# Patient Record
Sex: Male | Born: 1940 | Race: White | Hispanic: No | Marital: Single | Smoking: Never smoker
Health system: Southern US, Community
[De-identification: ages and names within clinical notes are randomized; demographics above are authoritative.]

## PROBLEM LIST (undated history)

## (undated) DIAGNOSIS — M1990 Unspecified osteoarthritis, unspecified site: Secondary | ICD-10-CM

## (undated) DIAGNOSIS — I4510 Unspecified right bundle-branch block: Secondary | ICD-10-CM

## (undated) DIAGNOSIS — I2510 Atherosclerotic heart disease of native coronary artery without angina pectoris: Secondary | ICD-10-CM

## (undated) DIAGNOSIS — K219 Gastro-esophageal reflux disease without esophagitis: Secondary | ICD-10-CM

## (undated) DIAGNOSIS — I10 Essential (primary) hypertension: Secondary | ICD-10-CM

## (undated) DIAGNOSIS — T7840XA Allergy, unspecified, initial encounter: Secondary | ICD-10-CM

## (undated) DIAGNOSIS — E782 Mixed hyperlipidemia: Secondary | ICD-10-CM

## (undated) DIAGNOSIS — R252 Cramp and spasm: Secondary | ICD-10-CM

## (undated) DIAGNOSIS — Z5189 Encounter for other specified aftercare: Secondary | ICD-10-CM

## (undated) DIAGNOSIS — G629 Polyneuropathy, unspecified: Secondary | ICD-10-CM

## (undated) DIAGNOSIS — R739 Hyperglycemia, unspecified: Secondary | ICD-10-CM

## (undated) DIAGNOSIS — E119 Type 2 diabetes mellitus without complications: Secondary | ICD-10-CM

## (undated) DIAGNOSIS — I519 Heart disease, unspecified: Secondary | ICD-10-CM

## (undated) DIAGNOSIS — E0842 Diabetes mellitus due to underlying condition with diabetic polyneuropathy: Secondary | ICD-10-CM

## (undated) HISTORY — PX: BACK SURGERY: SHX140

## (undated) HISTORY — DX: Unspecified right bundle-branch block: I45.10

## (undated) HISTORY — PX: PERCUTANEOUS CORONARY STENT INTERVENTION (PCI-S): SHX6016

## (undated) HISTORY — DX: Encounter for other specified aftercare: Z51.89

## (undated) HISTORY — DX: Gastro-esophageal reflux disease without esophagitis: K21.9

## (undated) HISTORY — DX: Allergy, unspecified, initial encounter: T78.40XA

## (undated) HISTORY — DX: Type 2 diabetes mellitus without complications: E11.9

## (undated) HISTORY — DX: Hyperglycemia, unspecified: R73.9

## (undated) HISTORY — PX: TOTAL HIP ARTHROPLASTY: SHX124

## (undated) HISTORY — DX: Heart disease, unspecified: I51.9

## (undated) HISTORY — PX: INGUINAL HERNIA REPAIR: SHX194

## (undated) HISTORY — PX: ACHILLES TENDON REPAIR: SUR1153

## (undated) HISTORY — DX: Atherosclerotic heart disease of native coronary artery without angina pectoris: I25.10

## (undated) HISTORY — PX: REPLACEMENT TOTAL KNEE: SUR1224

## (undated) HISTORY — DX: Polyneuropathy, unspecified: G62.9

## (undated) HISTORY — DX: Cramp and spasm: R25.2

## (undated) HISTORY — DX: Mixed hyperlipidemia: E78.2

## (undated) HISTORY — PX: FOOT SURGERY: SHX648

## (undated) HISTORY — DX: Essential (primary) hypertension: I10

## (undated) HISTORY — PX: CORONARY ANGIOPLASTY: SHX604

## (undated) HISTORY — DX: Unspecified osteoarthritis, unspecified site: M19.90

## (undated) HISTORY — DX: Diabetes mellitus due to underlying condition with diabetic polyneuropathy: E08.42

---

## 2017-08-20 ENCOUNTER — Encounter: Payer: Self-pay | Admitting: Neurology

## 2017-08-26 ENCOUNTER — Other Ambulatory Visit: Payer: Self-pay | Admitting: *Deleted

## 2017-08-26 ENCOUNTER — Ambulatory Visit (INDEPENDENT_AMBULATORY_CARE_PROVIDER_SITE_OTHER): Payer: Federal, State, Local not specified - PPO | Admitting: Neurology

## 2017-08-26 ENCOUNTER — Encounter: Payer: Self-pay | Admitting: Neurology

## 2017-08-26 VITALS — BP 160/80 | HR 63 | Ht 72.0 in | Wt 244.4 lb

## 2017-08-26 DIAGNOSIS — R252 Cramp and spasm: Secondary | ICD-10-CM | POA: Insufficient documentation

## 2017-08-26 DIAGNOSIS — E0842 Diabetes mellitus due to underlying condition with diabetic polyneuropathy: Secondary | ICD-10-CM | POA: Insufficient documentation

## 2017-08-26 DIAGNOSIS — G629 Polyneuropathy, unspecified: Secondary | ICD-10-CM

## 2017-08-26 HISTORY — DX: Diabetes mellitus due to underlying condition with diabetic polyneuropathy: E08.42

## 2017-08-26 HISTORY — DX: Cramp and spasm: R25.2

## 2017-08-26 NOTE — Addendum Note (Signed)
Addended by: Chester Holstein on: 08/26/2017 02:42 PM   Modules accepted: Orders

## 2017-08-26 NOTE — Progress Notes (Signed)
[facility] Clinic Note - Initial Visit   Date: 08/26/17  Darin Mcclure MRN: [id number] DOB: [birth year]   Dear Dr. Ernie Hew:  Thank you for your kind referral of Darin Mcclure for consultation of neuropathy. Although his history is well known to you, please allow [place] to reiterate it for the purpose of our medical record. The patient was accompanied to the clinic by self.   History of Present Illness: Darin Mcclure is a 76 y.o. right-handed Caucasian male with hypertensin, hyperlipidemia, diabetes mellitus, CAD s/p PCI with stent, and GERD presenting for evaluation of neuropathy.    He started having numbness of toes, which had progressed over the years to involve his foot and lower leg.  He was diagnosed with neuropathy and seen at [facility] and many years later, was diagnosed with diabetes mellitus.  He complains that his left foot feels heavy, especially when he wakes up.  He has very brief spells of pulsating sharp pain, lasting 30-min to several hours which can affect both legs.  He has not noticed any triggers.  He has noticed greater difficulty with balance, especially when his eyes are closed.  He has not suffered any falls and walks unassisted.  He was offered gabapentin, but he did not take this because his pain is very sporadic.   He saw Dr. Yehuda Savannah, [facility]. while living in [place] and moved here in July 2018.  He had NCS/EMG which confirmed neuropathy.    Rarely, he has spells of left lateral lower leg cramps which can be severe.  He takes magnesium oxide for cramps.  Out-side paper records, electronic medical record, and images have been reviewed where available and summarized as:  Labs 08/03/2016:  Folate >24, TSH 2.37, Lyme neg, RF neg, CCP neg, HbA1c 6.3, ANA neg, vitamin B12 309  TCD 08/03/2016:  Normal EEG 08/03/2016:  Normal  Cognitive testing 08/03/2016:  Normal NCS/EMG of the legs 08/03/2016:  Diffuse sensorimotor polyneuropathy  Past  Medical History: Coronary artery disease Diabetes mellitus Neuropathy   Past Surgical History:  Procedure Laterality Date  . right hip replace    . TOTAL HIP ARTHROPLASTY Right      Medications:  Outpatient Encounter Prescriptions as of 08/26/2017  Medication Sig  . aspirin EC 81 MG tablet Take 81 mg by mouth daily.  Marland Kitchen atorvastatin (LIPITOR) 10 MG tablet Take 10 mg by mouth daily.  . clopidogrel (PLAVIX) 75 MG tablet Take 75 mg by mouth daily.  . diclofenac sodium (VOLTAREN) 1 % GEL apply 2 grams topically TO affected AREA 4 TIMES DAILY  . fluticasone (FLONASE) 50 MCG/ACT nasal spray Place 1 spray into both nostrils daily.  Marland Kitchen losartan (COZAAR) 100 MG tablet Take 100 mg by mouth daily.  . Magnesium 500 MG CAPS Take 1,000 mg by mouth.  . metFORMIN (GLUCOPHAGE) 1000 MG tablet TAKE 1 TABLET BY MOUTH 2 TIMES DAILY WITH MEALS  . Methylcellulose, Laxative, (CITRUCEL PO) Take by mouth.  . metoprolol succinate (TOPROL-XL) 50 MG 24 hr tablet Take 100 mg by mouth daily.  . Multiple Vitamin (MULTIVITAMIN) tablet Take 1 tablet by mouth daily.  . Omega-3 Fatty Acids (OMEGA-3 FISH OIL PO) Take by mouth.  . Probiotic Product (PROBIOTIC DAILY PO) Take by mouth.  . ranitidine (ZANTAC) 300 MG tablet TAKE 2 TABLETS BY MOUTH EVERY DAY AS NEEDED   No facility-administered encounter medications on file as of 08/26/2017.      Allergies: No Known Allergies  Family History: Mother had osteoporosis, rheumatoid  arthritis, and glaucoma Father had lung cancer and CAD requiring CABG Sister is living.   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    Review of Systems:  CONSTITUTIONAL: No fevers, chills, night sweats, or weight loss.   EYES: No visual changes or eye pain ENT: No hearing changes.  No history of nose bleeds.   RESPIRATORY: No cough, wheezing and shortness of breath.     CARDIOVASCULAR: Negative for chest pain, and palpitations.   GI: Negative for abdominal discomfort, blood in stools or black stools.  No recent change in bowel habits.   GU:  No history of incontinence.   MUSCLOSKELETAL: No history of joint pain or swelling.  No myalgias.   SKIN: Negative for lesions, rash, and itching.   HEMATOLOGY/ONCOLOGY: Negative for prolonged bleeding, bruising easily, and swollen nodes.  No history of cancer.   ENDOCRINE: Negative for cold or heat intolerance, polydipsia or goiter.   PSYCH:  No depression or anxiety symptoms.   NEURO: As Above.   Vital Signs:  BP (!) 160/80   Pulse 63   Ht 6' (1.829 m)   Wt 244 lb 7 oz (110.9 kg)   SpO2 96%   BMI 33.15 kg/m    General Medical Exam:   General:  Well appearing, comfortable.   Eyes/ENT: see cranial nerve examination.   Neck: No masses appreciated.  Full range of motion without tenderness.  No carotid bruits. Respiratory:  Clear to auscultation, good air entry bilaterally.   Cardiac:  Regular rate and rhythm, no murmur.   Extremities:  No deformities, edema, or skin discoloration.  Skin:  No rashes or lesions.  Neurological Exam: MENTAL STATUS including orientation to time, place, person, recent and remote memory, attention span and concentration, language, and fund of knowledge is normal.  Speech is not dysarthric.  CRANIAL NERVES: II:  No visual field defects.  Unremarkable fundi.   III-IV-VI: Pupils equal round and reactive to light.  Normal conjugate, extra-ocular eye movements in all directions of gaze.  No nystagmus.  No ptosis.   V:  Normal facial sensation.     VII:  Normal facial symmetry and movements.   VIII:  Normal hearing and vestibular function.   IX-X:  Normal palatal movement.   XI:  Normal shoulder shrug and head rotation.   XII:  Normal tongue strength and range of motion, no deviation or fasciculation.  MOTOR:  No atrophy, fasciculations or abnormal movements.  No pronator drift.   Tone is normal.    Right Upper Extremity:    Left Upper Extremity:    Deltoid  5/5   Deltoid  5/5   Biceps  5/5   Biceps  5/5   Triceps  5/5   Triceps  5/5   Wrist extensors  5/5   Wrist extensors  5/5   Wrist flexors  5/5   Wrist flexors  5/5   Finger extensors  5/5   Finger extensors  5/5   Finger flexors  5/5   Finger flexors  5/5   Dorsal interossei  5/5   Dorsal interossei  5/5   Abductor pollicis  5/5   Abductor pollicis  5/5   Tone (Ashworth scale)  0  Tone (Ashworth scale)  0   Right Lower Extremity:    Left Lower Extremity:    Hip flexors  5/5   Hip flexors  5/5  Hip extensors  5/5   Hip extensors  5/5   Knee flexors  5/5   Knee flexors  5/5   Knee extensors  5/5   Knee extensors  5/5   Dorsiflexors  5/5   Dorsiflexors  5/5   Plantarflexors  5/5   Plantarflexors  5/5   Toe extensors  5/5   Toe extensors  5/5   Toe flexors  5/5   Toe flexors  5/5   Tone (Ashworth scale)  0  Tone (Ashworth scale)  0   MSRs:  Right                                                                 Left brachioradialis 2+  brachioradialis 2+  biceps 2+  biceps 2+  triceps 2+  triceps 2+  patellar 2+  patellar 2+  ankle jerk 1+  ankle jerk 1+  Hoffman no  Hoffman no  plantar response down  plantar response down   SENSORY:  Vibration is reduced to 10% at the ankles and absent at the toes; temperature and pin prick reduced over the feet.  Rhomberg sign is present.  COORDINATION/GAIT: Normal finger-to- nose-finger.  Intact rapid alternating movements bilaterally.  Gait narrow based and stable. He is unsteady with tandem and stressed gait.    IMPRESSION: Darin Mcclure is a 76 year-old gentleman referred for evaluation of bilateral feet numbness and leg cramps. His neurological examination shows a distal predominant  large fiber peripheral neuropathy. I had extensive discussion with the patient regarding the pathogenesis, etiology, management, and natural course of neuropathy. Neuropathy tends to  be slowly progressive, even with the most optimal diabetes management.  I praised him for doing well and keeping sugars controlled.  To be complete, I will check a few remaining labs for secondary causes of neuropathy.  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  He has many questions about neuropathy, which was answered to the best of my ability.   PLAN/RECOMMENDATIONS:  1.  Check vitamin B12, copper, MMA, SPEP with IFE 2.  For episodic painful paresthesias, he may use lidocaine ointment 3.  For cramps, continue magnesium oxide 500mg  daily and try tonic water 2-3 glasses at bedtime 4.  Fall precautions discussed and he was encouraged to place grab bars in the bathroom  Return to clinic in 1 year   The duration of this appointment visit was 50 minutes of face-to-face time with the patient.  Greater than 50% of this time was spent in counseling, explanation of diagnosis, planning of further management, and coordination of care.   Thank you for allowing me to participate in patient's care.  If I can answer any additional questions, I would be pleased to do so.    Sincerely,    Karrington Studnicka K. Posey Pronto, DO

## 2017-08-26 NOTE — Patient Instructions (Addendum)
1.  Check labs 2.  You may try lidocaine (Aspercream, Salonpas) ointment to your feet for severe pain  3.  Continue magnesium oxide 500mg  4.  You can try tonic water 2-3 glasses at bedtime for cramps  We will call you with the results of your testing  Return to clinic in 1 year

## 2017-08-28 LAB — METHYLMALONIC ACID, SERUM: Methylmalonic Acid, Quant: 165 nmol/L (ref 87–318)

## 2017-08-30 ENCOUNTER — Telehealth: Payer: Self-pay | Admitting: Neurology

## 2017-08-30 LAB — VITAMIN B12: VITAMIN B 12: 298 pg/mL (ref 200–1100)

## 2017-08-30 LAB — PROTEIN ELECTROPHORESIS, SERUM
ALBUMIN ELP: 4.4 g/dL (ref 3.8–4.8)
Alpha 1: 0.2 g/dL (ref 0.2–0.3)
Alpha 2: 0.7 g/dL (ref 0.5–0.9)
BETA 2: 0.3 g/dL (ref 0.2–0.5)
Beta Globulin: 0.5 g/dL (ref 0.4–0.6)
Gamma Globulin: 1 g/dL (ref 0.8–1.7)
TOTAL PROTEIN: 7.1 g/dL (ref 6.1–8.1)

## 2017-08-30 LAB — IMMUNOFIXATION ELECTROPHORESIS
IGM, SERUM: 26 mg/dL — AB (ref 48–271)
IMMUNOFIX ELECTR INT: NOT DETECTED
IgG (Immunoglobin G), Serum: 1134 mg/dL (ref 694–1618)
Immunoglobulin A: 185 mg/dL (ref 81–463)

## 2017-08-30 LAB — COPPER, SERUM: Copper: 78 ug/dL (ref 70–175)

## 2017-08-30 NOTE — Telephone Encounter (Signed)
-----   Message from Alda Berthold, DO sent at 08/30/2017  2:02 PM EDT ----- Please notify pt that his vitamin B12 is 298, which is still within normal limits, but on the lower end.  He can start OTC vitamin B12 1050mcg daily. Remaining labs are normal.  Thank you.

## 2017-08-30 NOTE — Telephone Encounter (Signed)
Patient made aware of results and instructions.  

## 2017-10-03 ENCOUNTER — Telehealth: Payer: Self-pay

## 2017-10-03 NOTE — Telephone Encounter (Signed)
Release Of Information faxed to [facility].

## 2017-10-04 ENCOUNTER — Telehealth: Payer: Self-pay

## 2017-10-04 NOTE — Telephone Encounter (Signed)
Records received from [facility]. Placed in Chart Prep Room For Patient to make New Appointment.

## 2017-11-04 ENCOUNTER — Ambulatory Visit: Payer: Federal, State, Local not specified - PPO | Admitting: Cardiology

## 2017-12-05 ENCOUNTER — Other Ambulatory Visit: Payer: Self-pay | Admitting: Family Medicine

## 2017-12-05 ENCOUNTER — Ambulatory Visit
Admission: RE | Admit: 2017-12-05 | Discharge: 2017-12-05 | Disposition: A | Discharge status: Home / Self Care | Payer: Federal, State, Local not specified - PPO | Source: Ambulatory Visit | Attending: Family Medicine | Admitting: Family Medicine

## 2017-12-05 DIAGNOSIS — M1990 Unspecified osteoarthritis, unspecified site: Secondary | ICD-10-CM

## 2017-12-20 ENCOUNTER — Encounter: Payer: Self-pay | Admitting: Cardiology

## 2017-12-20 ENCOUNTER — Ambulatory Visit: Payer: Federal, State, Local not specified - PPO | Admitting: Cardiology

## 2017-12-20 VITALS — BP 136/68 | HR 64 | Ht 72.0 in | Wt 248.4 lb

## 2017-12-20 DIAGNOSIS — M7752 Other enthesopathy of left foot: Secondary | ICD-10-CM

## 2017-12-20 DIAGNOSIS — E785 Hyperlipidemia, unspecified: Secondary | ICD-10-CM

## 2017-12-20 DIAGNOSIS — I2510 Atherosclerotic heart disease of native coronary artery without angina pectoris: Secondary | ICD-10-CM

## 2017-12-20 DIAGNOSIS — Z79899 Other long term (current) drug therapy: Secondary | ICD-10-CM | POA: Diagnosis not present

## 2017-12-20 DIAGNOSIS — I10 Essential (primary) hypertension: Secondary | ICD-10-CM | POA: Diagnosis not present

## 2017-12-20 DIAGNOSIS — Z7689 Persons encountering health services in other specified circumstances: Secondary | ICD-10-CM

## 2017-12-20 NOTE — Progress Notes (Signed)
Cardiology Office Note:    Date:  12/20/2017   ID:  Darin Mcclure, DOB 05-21-1941, MRN [id number]  PCP:  Darin Baton, MD  Cardiologist:  No primary care provider on file.   Referring MD: No ref. provider found     History of Present Illness:    Darin Mcclure is a 77 y.o. male with coronary artery disease, DES to mid right, ostium of left main coronary artery was normal by IVUS (stent was Cypher 3.0 placed on 10/04/2006) here to establish cardiology care having moved from [place].  Subsequent catheterization performed on 09/29/2008 showed patent right coronary artery stent in the mid region, 70% EF, moderate coronary artery disease involving the LAD diagonal circumflex branches.  EKG personally reviewed from 11/28/16 shows sinus rhythm right bundle branch block with no other abnormalities  Lab work 04/14/15-total cholesterol 111 triglycerides 121 HDL 44 LDL 43 creatinine 1.1 ALT 29.  Echocardiogram from 02/28/16 showed normal EF, dilated left atrium, mild LVH, trace aortic regurgitation, mild mitral regurgitation, mild tricuspid regurgitation.  He also has a past mental history of diabetes with peripheral neuropathy, hyperlipidemia, hypertension.  Stress test on 12/06/14 showed mild small area of infarct in the inferior wall, LV function abnormal and mid inferior and basal inferior wall.  EF was 50%.  Medical management.  Carotid duplex showed no significant stenosis bilaterally.  A calcified stable plaque of the carotid bulbs mostly on the left.  Overall has been doing quite well.  No current complaints.  Retired from [employer] after 50 years. Moved into to town to be with grandkids.   Past Medical History:  Diagnosis Date  . Coronary artery disease   . Diabetic polyneuropathy associated with diabetes mellitus due to underlying condition ([name]) 08/26/2017  . Heart disease   . Hyperglycemia   . Hypertension   . Mixed hyperlipidemia   . Muscle cramp 08/26/2017  . Neuropathy     . Osteoarthritis   . RBBB   . RBBB (right bundle branch block)   . Type 2 diabetes mellitus ([name])     Past Surgical History:  Procedure Laterality Date  . ACHILLES TENDON REPAIR Right   . BACK SURGERY    . CORONARY ANGIOPLASTY     pror stent to mid RCA 10/04/2006  . INGUINAL HERNIA REPAIR    . PERCUTANEOUS CORONARY STENT INTERVENTION (PCI-S)    . REPLACEMENT TOTAL KNEE Left   . TOTAL HIP ARTHROPLASTY Right   . TOTAL HIP ARTHROPLASTY Left     Current Medications: Current Meds  Medication Sig  . aspirin EC 81 MG tablet Take 81 mg by mouth daily.  Marland Kitchen atorvastatin (LIPITOR) 10 MG tablet Take 10 mg by mouth daily.  . cetirizine (ZYRTEC) 10 MG tablet Take 10 mg by mouth daily as needed.   . clopidogrel (PLAVIX) 75 MG tablet Take 75 mg by mouth daily.  . diclofenac sodium (VOLTAREN) 1 % GEL apply 2 grams topically TO affected AREA 4 TIMES DAILY  . fluticasone (FLONASE) 50 MCG/ACT nasal spray Place 1 spray into both nostrils daily as needed.   Marland Kitchen losartan (COZAAR) 100 MG tablet Take 100 mg by mouth daily.  . Magnesium 500 MG CAPS Take 1,000 mg by mouth.  . metFORMIN (GLUCOPHAGE) 1000 MG tablet TAKE 1 TABLET BY MOUTH 2 TIMES DAILY WITH MEALS  . Methylcellulose, Laxative, (CITRUCEL PO) Take by mouth.  . metoprolol succinate (TOPROL-XL) 50 MG 24 hr tablet Take 100 mg by mouth daily.  . Multiple Vitamin (MULTIVITAMIN) tablet Take  1 tablet by mouth daily.  . Omega-3 Fatty Acids (OMEGA-3 FISH OIL PO) Take by mouth.  . Probiotic Product (PROBIOTIC DAILY PO) Take by mouth.     Allergies:   Patient has no known allergies.   Social History   Socioeconomic History  . Marital status: Single    Spouse name: None  . Number of children: 2  . Years of education: None  . Highest education level: None  Social Needs  . Financial resource strain: None  . Food insecurity - worry: None  . Food insecurity - inability: None  . Transportation needs - medical: None  . Transportation needs -  non-medical: None  Occupational History  . None  Tobacco Use  . Smoking status: Never Smoker  . Smokeless tobacco: Never Used  Substance and Sexual Activity  . Alcohol use: Yes    Comment: Rarely  . Drug use: No  . Sexual activity: None  Other Topics Concern  . 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      Family History: The patient's family history includes CAD in his father.  ROS:   Please see the history of present illness.    All other systems reviewed and are negative.  EKGs/Labs/Other Studies Reviewed:    The following studies were reviewed today: Prior office notes, lab work, studies reviewed  EKG:  EKG is ordered today.  Sinus rhythm right bundle branch block 64 with no other abnormalities.  Reviewed  Recent Labs: No results found for requested labs within last 8760 hours.  Recent Lipid Panel No results found for: CHOL, TRIG, HDL, CHOLHDL, VLDL, LDLCALC, LDLDIRECT  Physical Exam:    VS:  BP 136/68 (BP Location: Right Arm, Patient Position: Sitting, Cuff Size: Large)   Pulse 64   Ht 6' (1.829 m)   Wt 248 lb 6.4 oz (112.7 kg)   SpO2 96%   BMI 33.69 kg/m     Wt Readings from Last 3 Encounters:  12/20/17 248 lb 6.4 oz (112.7 kg)  08/26/17 244 lb 7 oz (110.9 kg)     GEN:  Well nourished, well developed in no acute distress HEENT: Normal NECK: No JVD; No carotid bruits LYMPHATICS: No lymphadenopathy CARDIAC: RRR, no murmurs, rubs, gallops RESPIRATORY:  Clear to auscultation without rales, wheezing or rhonchi  ABDOMEN: Soft, non-tender, non-distended MUSCULOSKELETAL:  No edema; left foot, left anterior base of first toe with bony prominence demonstrating a central crater with scabbing, chronic.  He wears soft shoes. SKIN: Warm and dry NEUROLOGIC:  Alert and oriented x 3 PSYCHIATRIC:  Normal affect   ASSESSMENT:     1. Coronary artery disease involving native coronary artery of native heart without angina pectoris   2. Essential hypertension   3. Long-term use of high-risk medication   4. Hyperlipidemia, unspecified hyperlipidemia type   5. Encounter to establish care with new doctor   6. Bone spur of toe of left foot    PLAN:    In order of problems listed above:  Coronary artery disease -Cypher stent placed in mid RCA in 2007.  Continue with dual antiplatelet therapy because of early generation drug-eluting stent.  No changes made.  Not having any anginal symptoms.  Last stress test in 2016 was reassuring.  Essential hypertension -Under good control.  Medications reviewed  Hyperlipidemia -Continue statin therapy.  No changes made.  Checking lab work.  Diabetes type 2 -Metformin, A1c previously 6.3.  Leg cramps  - Magnesium has helped.   He requested a new primary care physician.  I recommended an Darin Mcclure.  We will also refer him to a podiatrist given his left foot lesion.  He has seen a podiatrist previously in [place] who told him to hold off on operation.    Medication Adjustments/Labs and Tests Ordered: Current medicines are reviewed at length with the patient today.  Concerns regarding medicines are outlined above.  Orders Placed This Encounter  Procedures  . Comprehensive metabolic panel  . CBC  . Lipid panel  . Ambulatory referral to Internal Medicine  . Ambulatory referral to Podiatry   No orders of the defined types were placed in this encounter.   Signed, Candee Furbish, MD  12/20/2017 6:58 PM    Darin Mcclure

## 2017-12-20 NOTE — Patient Instructions (Signed)
Medication Instructions:  The current medical regimen is effective;  continue present plan and medications.  Labwork: Please return fasting for blood work next week (Lipid, CBC and CMP)  You are being referred to Dr Shon Baton to establish for primary care.  Follow-Up: Follow up in 1 year with Dr. Marlou Porch.  You will receive a letter in the mail 2 months before you are due.  Please call us when you receive this letter to schedule your follow up appointment.   If you need a refill on your cardiac medications before your next appointment, please call your pharmacy.  Thank you for choosing [facility]!!

## 2017-12-23 NOTE — Addendum Note (Signed)
Addended by: Jacinta Shoe on: 12/23/2017 02:45 PM   Modules accepted: Orders

## 2017-12-25 ENCOUNTER — Other Ambulatory Visit: Payer: Federal, State, Local not specified - PPO | Admitting: *Deleted

## 2017-12-25 DIAGNOSIS — Z79899 Other long term (current) drug therapy: Secondary | ICD-10-CM

## 2017-12-25 DIAGNOSIS — I10 Essential (primary) hypertension: Secondary | ICD-10-CM

## 2017-12-25 DIAGNOSIS — E785 Hyperlipidemia, unspecified: Secondary | ICD-10-CM

## 2017-12-26 LAB — LIPID PANEL
CHOLESTEROL TOTAL: 100 mg/dL (ref 100–199)
Chol/HDL Ratio: 2.8 ratio (ref 0.0–5.0)
HDL: 36 mg/dL — ABNORMAL LOW (ref >39)
LDL Calculated: 31 mg/dL (ref 0–99)
Triglycerides: 166 mg/dL — ABNORMAL HIGH (ref 0–149)
VLDL Cholesterol Cal: 33 mg/dL (ref 5–40)

## 2017-12-26 LAB — CBC
HEMATOCRIT: 40.4 % (ref 37.5–51.0)
HEMOGLOBIN: 13.7 g/dL (ref 13.0–17.7)
MCH: 31.1 pg (ref 26.6–33.0)
MCHC: 33.9 g/dL (ref 31.5–35.7)
MCV: 92 fL (ref 79–97)
Platelets: 161 10*3/uL (ref 150–379)
RBC: 4.41 x10E6/uL (ref 4.14–5.80)
RDW: 13.5 % (ref 12.3–15.4)
WBC: 5.3 10*3/uL (ref 3.4–10.8)

## 2017-12-26 LAB — COMPREHENSIVE METABOLIC PANEL
ALBUMIN: 4.6 g/dL (ref 3.5–4.8)
ALK PHOS: 47 IU/L (ref 39–117)
ALT: 29 IU/L (ref 0–44)
AST: 23 IU/L (ref 0–40)
Albumin/Globulin Ratio: 1.9 (ref 1.2–2.2)
BUN / CREAT RATIO: 22 (ref 10–24)
BUN: 28 mg/dL — AB (ref 8–27)
Bilirubin Total: 0.7 mg/dL (ref 0.0–1.2)
CALCIUM: 9.2 mg/dL (ref 8.6–10.2)
CO2: 21 mmol/L (ref 20–29)
CREATININE: 1.27 mg/dL (ref 0.76–1.27)
Chloride: 99 mmol/L (ref 96–106)
GFR calc Af Amer: 63 mL/min/{1.73_m2} (ref >59)
GFR, EST NON AFRICAN AMERICAN: 55 mL/min/{1.73_m2} — AB (ref >59)
GLUCOSE: 148 mg/dL — AB (ref 65–99)
Globulin, Total: 2.4 g/dL (ref 1.5–4.5)
Potassium: 4.8 mmol/L (ref 3.5–5.2)
Sodium: 137 mmol/L (ref 134–144)
TOTAL PROTEIN: 7 g/dL (ref 6.0–8.5)

## 2017-12-27 ENCOUNTER — Encounter: Payer: Self-pay | Admitting: *Deleted

## 2018-01-02 ENCOUNTER — Other Ambulatory Visit: Payer: Self-pay | Admitting: Podiatry

## 2018-01-02 ENCOUNTER — Ambulatory Visit: Payer: Federal, State, Local not specified - PPO | Admitting: Podiatry

## 2018-01-02 ENCOUNTER — Ambulatory Visit (INDEPENDENT_AMBULATORY_CARE_PROVIDER_SITE_OTHER): Payer: Federal, State, Local not specified - PPO

## 2018-01-02 ENCOUNTER — Encounter: Payer: Self-pay | Admitting: Podiatry

## 2018-01-02 DIAGNOSIS — M79672 Pain in left foot: Secondary | ICD-10-CM | POA: Diagnosis not present

## 2018-01-02 DIAGNOSIS — M205X2 Other deformities of toe(s) (acquired), left foot: Secondary | ICD-10-CM

## 2018-01-02 DIAGNOSIS — B351 Tinea unguium: Secondary | ICD-10-CM

## 2018-01-02 NOTE — Progress Notes (Signed)
   Subjective:    Patient ID: Darin Mcclure, male    DOB: 10/04/1941, 77 y.o.   MRN: [id number]  HPI    Review of Systems  All other systems reviewed and are negative.      Objective:   Physical Exam        Assessment & Plan:

## 2018-01-02 NOTE — Progress Notes (Signed)
Subjective:   Patient ID: Darin Mcclure, male   DOB: 77 y.o.   MRN: [id number]   HPI Patient presents with chronic irritation of the dorsum of the left first metatarsal with enlargement of the bone structure and discoloration of the right hallux nail.  States that the dorsum left has been bothersome but he does have neuropathy but cannot feel it and it has crusted tissue but has had no drainage or infection in the past.  Patient does not smoke and likes to be active   Review of Systems  All other systems reviewed and are negative.       Objective:  Physical Exam  Constitutional: He appears well-developed and well-nourished.  Cardiovascular: Intact distal pulses.  Pulmonary/Chest: Effort normal.  Musculoskeletal: Normal range of motion.  Neurological: He is alert.  Skin: Skin is warm.  Nursing note and vitals reviewed.   Neurovascular status is found to be intact with muscle strength adequate range of motion within normal limits.  Patient was noted to have large bone spur of the dorsum of the left first metatarsal with crusted tissue this form secondary to the pressure that on that area.  Patient states that he is tried to accommodate it with different shoe gear and daily has tried to soak it at times.  He states he has not noticed any drainage from it and is never broken down or form of ulceration.  Patient has good digital perfusion and his toes are warm and there is no other indication of pathology clinically     Assessment:  Severe bone spur formation dorsum left first metatarsal with crusted tissue formation secondary to irritation with slight mycotic nail infection right     Plan:  H&P and condition reviewed at great length.  Due to the crusted tissue and the large bone spur formation and neuropathy I would recommend that this needs to be removed.  I do think we can do a relatively simple procedure and just do a modified type McBride bunionectomy with reduction of all spurring and  I did explain that to him.  He is very motivated to do this wants to do it as soon as possible given his schedule and I did go ahead today and I allow him to read a consent form going over alternative treatments complications.  Patient reads and after extensive review and review with me he does sign consent form understanding all complications.  Scheduled for outpatient surgery in the next week and was encouraged to call with any questions he may have and I did explain to him with his neuropathy and some calcified arterial dorsalis pedis is could affect bone and skin healing.  I did dispense air fracture walker for the postop to keep the foot rigid at 90 degrees  X-rays indicate large spur formation of the first metatarsal left with no indications of arthritic process

## 2018-01-02 NOTE — Patient Instructions (Signed)
Pre-Operative Instructions  Congratulations, you have decided to take an important step towards improving your quality of life.  You can be assured that the doctors and staff at [facility] will be with you every step of the way.  Here are some important things you should know:  1. Plan to be at the surgery center/hospital at least 1 (one) hour prior to your scheduled time, unless otherwise directed by the surgical center/hospital staff.  You must have a responsible adult accompany you, remain during the surgery and drive you home.  Make sure you have directions to the surgical center/hospital to ensure you arrive on time. 2. If you are having surgery at [facility] or [facility] hospitals, you will need a copy of your medical history and physical form from your family physician within one month prior to the date of surgery. We will give you a form for your primary physician to complete.  3. We make every effort to accommodate the date you request for surgery.  However, there are times where surgery dates or times have to be moved.  We will contact you as soon as possible if a change in schedule is required.   4. No aspirin/ibuprofen for one week before surgery.  If you are on aspirin, any non-steroidal anti-inflammatory medications (Mobic, Aleve, Ibuprofen) should not be taken seven (7) days prior to your surgery.  You make take Tylenol for pain prior to surgery.  5. Medications - If you are taking daily heart and blood pressure medications, seizure, reflux, allergy, asthma, anxiety, pain or diabetes medications, make sure you notify the surgery center/hospital before the day of surgery so they can tell you which medications you should take or avoid the day of surgery. 6. No food or drink after midnight the night before surgery unless directed otherwise by surgical center/hospital staff. 7. No alcoholic beverages 24-hours prior to surgery.  No smoking 24-hours prior or 24-hours after  surgery. 8. Wear loose pants or shorts. They should be loose enough to fit over bandages, boots, and casts. 9. Don't wear slip-on shoes. Sneakers are preferred. 10. Bring your boot with you to the surgery center/hospital.  Also bring crutches or a walker if your physician has prescribed it for you.  If you do not have this equipment, it will be provided for you after surgery. 11. If you have not been contacted by the surgery center/hospital by the day before your surgery, call to confirm the date and time of your surgery. 12. Leave-time from work may vary depending on the type of surgery you have.  Appropriate arrangements should be made prior to surgery with your employer. 13. Prescriptions will be provided immediately following surgery by your doctor.  Fill these as soon as possible after surgery and take the medication as directed. Pain medications will not be refilled on weekends and must be approved by the doctor. 14. Remove nail polish on the operative foot and avoid getting pedicures prior to surgery. 15. Wash the night before surgery.  The night before surgery wash the foot and leg well with water and the antibacterial soap provided. Be sure to pay special attention to beneath the toenails and in between the toes.  Wash for at least three (3) minutes. Rinse thoroughly with water and dry well with a towel.  Perform this wash unless told not to do so by your physician.  Enclosed: 1 Ice pack (please put in freezer the night before surgery)   1 Hibiclens skin cleaner     Pre-op instructions  If you have any questions regarding the instructions, please do not hesitate to call our office.  [place]: [address], [place], [place] [address] -- [phone]  [place]: [address] [place], [place] [address] -- [phone]  [place]: [address]  [place], [place] [address] -- [phone]  [place]: [address], [address], [place], [place] [address] -- [phone]  Website:  [email or web address] 

## 2018-01-07 ENCOUNTER — Encounter: Payer: Self-pay | Admitting: Podiatry

## 2018-01-07 DIAGNOSIS — M2012 Hallux valgus (acquired), left foot: Secondary | ICD-10-CM | POA: Diagnosis not present

## 2018-01-09 ENCOUNTER — Telehealth: Payer: Self-pay | Admitting: *Deleted

## 2018-01-09 NOTE — Telephone Encounter (Signed)
Called and spoke with the patient and the patient stated that he was doing ok and was wearing the boot and icing and elevating and the pain is fine and has no fever, chills, nausea and has only taken two pain medicines and I stated to call the office if any concerns or questions. Lattie Haw

## 2018-01-15 ENCOUNTER — Ambulatory Visit (INDEPENDENT_AMBULATORY_CARE_PROVIDER_SITE_OTHER): Payer: Federal, State, Local not specified - PPO | Admitting: Podiatry

## 2018-01-15 ENCOUNTER — Ambulatory Visit (INDEPENDENT_AMBULATORY_CARE_PROVIDER_SITE_OTHER): Payer: Federal, State, Local not specified - PPO

## 2018-01-15 VITALS — BP 156/82 | HR 62 | Temp 98.6°F

## 2018-01-15 DIAGNOSIS — E0842 Diabetes mellitus due to underlying condition with diabetic polyneuropathy: Secondary | ICD-10-CM

## 2018-01-15 DIAGNOSIS — M205X2 Other deformities of toe(s) (acquired), left foot: Secondary | ICD-10-CM

## 2018-01-15 DIAGNOSIS — B351 Tinea unguium: Secondary | ICD-10-CM

## 2018-01-15 NOTE — Progress Notes (Signed)
Subjective:   Patient ID: Darin Mcclure, male   DOB: 77 y.o.   MRN: [id number]   HPI Patient states he is doing really well with his left foot with minimal discomfort and on his right he is concerned about the nail and whether we can do anything about it   ROS      Objective:  Physical Exam  Neurovascular status intact negative Homans sign noted with patient's left first MPJ healing well with wound edges well coapted no drainage stitches in place good range of motion no crepitus of the joint.  Thickened hallux nail right is noted with incurvation of the beds and minimal discomfort with no drainage     Assessment:  Healing well post surgery left with right foot doing well with mycotic damaged hallux nail     Plan:  H&P both conditions reviewed.  For the left I do think that the patient needs to continue conservative care and I reapplied sterile dressing instructed on continued immobilization dispensed surgical shoe with gradual increase in activities.  For the right I do not recommend treatment unless symptoms were to get worse or more start to drain or become loose  X-rays indicate satisfactory resection of bone from the dorsum of the first metatarsal left

## 2018-01-29 ENCOUNTER — Ambulatory Visit (INDEPENDENT_AMBULATORY_CARE_PROVIDER_SITE_OTHER): Payer: Federal, State, Local not specified - PPO | Admitting: Podiatry

## 2018-01-29 DIAGNOSIS — M205X2 Other deformities of toe(s) (acquired), left foot: Secondary | ICD-10-CM

## 2018-01-29 DIAGNOSIS — Z9889 Other specified postprocedural states: Secondary | ICD-10-CM

## 2018-02-01 NOTE — Progress Notes (Signed)
   Subjective:  Patient presents today status post bunionectomy left foot. DOS: 01/07/18. He states he is doing well. He denies any problems or complaints. He is still wearing the CAM boot as instructed. Patient is here for further evaluation and treatment.    Past Medical History:  Diagnosis Date  . Coronary artery disease   . Diabetic polyneuropathy associated with diabetes mellitus due to underlying condition ([place]) 08/26/2017  . Heart disease   . Hyperglycemia   . Hypertension   . Mixed hyperlipidemia   . Muscle cramp 08/26/2017  . Neuropathy   . Osteoarthritis   . RBBB   . RBBB (right bundle branch block)   . Type 2 diabetes mellitus ([place])       Objective/Physical Exam Neurovascular status intact.  Skin incisions appear to be well coapted with sutures and staples intact. No sign of infectious process noted. No dehiscence. No active bleeding noted. Moderate edema noted to the surgical extremity.   Assessment: 1. s/p bunionectomy left foot. DOS: 01/07/18   Plan of Care:  1. Patient was evaluated.  2. Sutures removed.  3. Continue weightbearing in short CAM boot.  4. Compression anklet dispensed.  5. Return to clinic in 2 weeks with Dr. Paulla Dolly.    Edrick Kins, DPM [facility]  Dr. Edrick Kins, [address]                                        [place], [place] [address]                Office [phone]  Fax [phone]

## 2018-02-12 ENCOUNTER — Ambulatory Visit (INDEPENDENT_AMBULATORY_CARE_PROVIDER_SITE_OTHER): Payer: Federal, State, Local not specified - PPO | Admitting: Podiatry

## 2018-02-12 ENCOUNTER — Encounter: Payer: Self-pay | Admitting: Podiatry

## 2018-02-12 ENCOUNTER — Ambulatory Visit (INDEPENDENT_AMBULATORY_CARE_PROVIDER_SITE_OTHER): Payer: Federal, State, Local not specified - PPO

## 2018-02-12 DIAGNOSIS — M779 Enthesopathy, unspecified: Secondary | ICD-10-CM | POA: Diagnosis not present

## 2018-02-12 DIAGNOSIS — M21619 Bunion of unspecified foot: Secondary | ICD-10-CM

## 2018-02-12 DIAGNOSIS — M205X2 Other deformities of toe(s) (acquired), left foot: Secondary | ICD-10-CM

## 2018-02-12 DIAGNOSIS — Z9889 Other specified postprocedural states: Secondary | ICD-10-CM

## 2018-02-12 NOTE — Progress Notes (Signed)
Subjective:   Patient ID: Darin Mcclure, male   DOB: 77 y.o.   MRN: [id number]   HPI Patient states doing pretty well but ice could use some support from her feet as they get tired and sore when I been on   ROS      Objective:  Physical Exam  Neurovascular status intact with patient's first metatarsal doing much better with satisfactory resection of bone and good healing of the incision site with moderate depression of the arch bilateral     Assessment:  Doing well from removal of bone first metatarsal head left foot chronic hallux limitus deformity with patient noted to have moderate tendinitis-like symptoms bilateral     Plan:  H&P x-rays reviewed and discussed long-term how well the left foot is doing.  At this point we will get him into orthotics to try to lift up his arch and he is scheduled for orthotics and was scanned today and will be dispensed by ped orthotist.  X-ray indicates satisfactory resection of bone head of first metatarsal left multiple signs of arthritis but not sore clinically

## 2018-03-18 ENCOUNTER — Ambulatory Visit: Payer: Federal, State, Local not specified - PPO | Admitting: Orthotics

## 2018-03-18 DIAGNOSIS — M205X2 Other deformities of toe(s) (acquired), left foot: Secondary | ICD-10-CM

## 2018-03-18 NOTE — Progress Notes (Signed)
Patient came in today to pick up custom made foot orthotics.  The goals were accomplished and the patient reported no dissatisfaction with said orthotics.  Patient was advised of breakin period and how to report any issues. 

## 2018-07-21 ENCOUNTER — Encounter: Payer: Self-pay | Admitting: Podiatry

## 2018-07-21 ENCOUNTER — Ambulatory Visit: Payer: Federal, State, Local not specified - PPO | Admitting: Podiatry

## 2018-07-21 DIAGNOSIS — E0842 Diabetes mellitus due to underlying condition with diabetic polyneuropathy: Secondary | ICD-10-CM

## 2018-07-21 DIAGNOSIS — M21619 Bunion of unspecified foot: Secondary | ICD-10-CM

## 2018-07-21 DIAGNOSIS — L84 Corns and callosities: Secondary | ICD-10-CM

## 2018-07-21 NOTE — Progress Notes (Signed)
Subjective:   Patient ID: Darin Mcclure, male   DOB: 77 y.o.   MRN: [id number]   HPI Patient presents stating he is got discoloration of several nails and lesions at the end of his toes which can become bothersome at times.  Patient states that he does have neuropathy and cannot feel his feet and is always concerned about these things and states his nails are incurvated and hard for him to take care of.  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  Patient to be seen back for reevaluation

## 2018-08-01 ENCOUNTER — Encounter: Payer: Self-pay | Admitting: Neurology

## 2018-08-01 ENCOUNTER — Ambulatory Visit: Payer: Federal, State, Local not specified - PPO | Admitting: Neurology

## 2018-08-01 VITALS — BP 140/74 | HR 64 | Ht 72.0 in | Wt 252.2 lb

## 2018-08-01 DIAGNOSIS — R413 Other amnesia: Secondary | ICD-10-CM

## 2018-08-01 DIAGNOSIS — M5481 Occipital neuralgia: Secondary | ICD-10-CM | POA: Diagnosis not present

## 2018-08-01 DIAGNOSIS — E0842 Diabetes mellitus due to underlying condition with diabetic polyneuropathy: Secondary | ICD-10-CM | POA: Diagnosis not present

## 2018-08-01 DIAGNOSIS — S91209A Unspecified open wound of unspecified toe(s) with damage to nail, initial encounter: Secondary | ICD-10-CM

## 2018-08-01 MED ORDER — GABAPENTIN 300 MG PO CAPS: ORAL_CAPSULE | ORAL | 11 refills | Status: DC

## 2018-08-01 NOTE — Progress Notes (Signed)
Follow-up Visit   Date: 08/01/18    Darin Mcclure MRN: [id number] DOB: 07-13-1941   Interim History: Darin Mcclure is a 77 y.o. right-handed Caucasian male with hypertensin, hyperlipidemia, diabetes mellitus, CAD s/p PCI with stent, and GERD returning to the clinic for follow-up of neuropathy and new complaints of right toe nail avulsion and left scalp paresthesias.  The patient was accompanied to the clinic by self.    History of present illness: He started having numbness of toes, which had progressed over the years to involve his foot and lower leg.  He was diagnosed with neuropathy and seen at [facility] and many years later, was diagnosed with diabetes mellitus.  He complains that his left foot feels heavy, especially when he wakes up.  He has very brief spells of pulsating sharp pain, lasting 30-min to several hours which can affect both legs.  He has not noticed any triggers.  He has noticed greater difficulty with balance, especially when his eyes are closed.  He has not suffered any falls and walks unassisted.  He was offered gabapentin, but he did not take this because his pain is very sporadic.   He saw Dr. Yehuda Savannah, [place]. while living in [place] and moved here in July 2018.  He had NCS/EMG which confirmed neuropathy.    Rarely, he has spells of left lateral lower leg cramps which can be severe.  He takes magnesium oxide for cramps.  UPDATE 08/01/2018: He is here for one-year follow-up visit.  He has noted mild progression of neuropathy especially numbness and tight sensation of the lower extremities.  The sensation can be worse at rest and is in the evening.  He is unable to feel anything in the feet due to the severity of numbness and unfortunately, stubbed his right toe few weeks ago.  The toenail has become black and started pulling away from the skin, so he removed it yesterday.  He would like for me to take a look at his toe.  He has not seen his primary care  provider for this.  He feels that his balance is slightly worse.  He continues to walk unassisted and has not had any falls.   Since his last visit.  He has noticed new left-sided shooting sensation that starts at the base of the scalp and radiates up towards his left ear.  It occurs daily and lasts about a minute.  No neck pain or arm weakness.   He also complained of being forgetful at times, such as walking into the room and forgetting the reason for being there.  He is able to maintain all instrumental ADLs such as driving, managing finances, and household chores.  Medications:  Current Outpatient Medications on File Prior to Visit  Medication Sig Dispense Refill  . aspirin EC 81 MG tablet Take 81 mg by mouth daily.    Marland Kitchen atorvastatin (LIPITOR) 10 MG tablet Take 10 mg by mouth daily.  2  . cetirizine (ZYRTEC) 10 MG tablet Take 10 mg by mouth daily as needed.     . clopidogrel (PLAVIX) 75 MG tablet Take 75 mg by mouth daily.  2  . fluticasone (FLONASE) 50 MCG/ACT nasal spray Place 1 spray into both nostrils daily as needed.   3  . JANUVIA 100 MG tablet Take 100 mg by mouth daily.  3  . losartan (COZAAR) 100 MG tablet Take 100 mg by mouth daily.  2  . Magnesium 500 MG CAPS Take 1,000 mg  by mouth.    . metFORMIN (GLUCOPHAGE) 1000 MG tablet TAKE 1 TABLET BY MOUTH 2 TIMES DAILY WITH MEALS  2  . Methylcellulose, Laxative, (CITRUCEL PO) Take by mouth.    . metoprolol succinate (TOPROL-XL) 50 MG 24 hr tablet Take 100 mg by mouth daily.  2  . Multiple Vitamin (MULTIVITAMIN) tablet Take 1 tablet by mouth daily.    . Omega-3 Fatty Acids (OMEGA-3 FISH OIL PO) Take by mouth.    . Probiotic Product (PROBIOTIC DAILY PO) Take by mouth.     No current facility-administered medications on file prior to visit.     Allergies: No Known Allergies  Review of Systems:  CONSTITUTIONAL: No fevers, chills, night sweats, or weight loss.  EYES: No visual changes or eye pain ENT: No hearing changes.  No history  of nose bleeds.   RESPIRATORY: No cough, wheezing and shortness of breath.   CARDIOVASCULAR: Negative for chest pain, and palpitations.   GI: Negative for abdominal discomfort, blood in stools or black stools.  No recent change in bowel habits.   GU:  No history of incontinence.   MUSCLOSKELETAL: No history of joint pain or swelling.  No myalgias.   SKIN: Negative for lesions, rash, and itching.   ENDOCRINE: Negative for cold or heat intolerance, polydipsia or goiter.   PSYCH:  No depression or anxiety symptoms.   NEURO: As Above.   Vital Signs:  BP 140/74   Pulse 64   Ht 6' (1.829 m)   Wt 252 lb 4 oz (114.4 kg)   SpO2 95%   BMI 34.21 kg/m   General Medical Exam:   General:  Well appearing, comfortable  Eyes/ENT: see cranial nerve examination.   Neck: No masses appreciated.  Full range of motion without tenderness.  No carotid bruits.  There is no tenderness at the left GON or LON. Respiratory:  Clear to auscultation, good air entry bilaterally.   Cardiac:  Regular rate and rhythm, no murmur.   Ext:  Right great toe nail is removed and trace oozing bright red blood, no signs of draining or infection, or swelling.   Neurological Exam: MENTAL STATUS including orientation to time, place, person, recent and remote memory, attention span and concentration, language, and fund of knowledge is normal.  Speech is not dysarthric.  CRANIAL NERVES:  Pupils equal round and reactive to light.  Normal conjugate, extra-ocular eye movements in all directions of gaze.  Trace left ptosis.  Face is symmetric. Palate elevates symmetrically.  Tongue is midline.  MOTOR:  Motor strength is 5/5 in all extremities.  No atrophy, fasciculations or abnormal movements.  No pronator drift.  Tone is normal.    MSRs:  Reflexes are 2+/4 throughout, except 1+at the Achilles bilaterally.  SENSORY:  Vibration is absent distal to ankles bilaterally, intact at the knees.  Pin prick and temperature is reduced distal  to mid-calf bilaterally.  COORDINATION/GAIT:   Gait narrow based and stable, mildly wide-based.  Unassisted  Data: Labs 08/03/2016:  Folate >24, TSH 2.37, Lyme neg, RF neg, CCP neg, HbA1c 6.3, ANA neg, vitamin B12 309 Labs 08/26/2017:  Vitamin B12 298, copper 78, MMA 165, SPEP with IFE no M protein  TCD 08/03/2016:  Normal EEG 08/03/2016:  Normal  Cognitive testing 08/03/2016:  Normal NCS/EMG of the legs 08/03/2016:  Diffuse sensorimotor polyneuropathy   IMPRESSION/PLAN: 1.  Diabetic peripheral neuropathy, as expected there is gradual progression of the disease.  Symptoms are predominately numbness of the legs and very mild  sensory ataxia. Long discussion regarding the pathogenesis, management options, and prognosis.   Symptoms of numbness/tightness are worse at nighttime and most likely associated with neuropathy.  Doubt RLS as he does not have marked restless sensation.  Will continue to follow.  2.  Left lesser occipital neuralgia - new.  Discussed options of nerve block, gabapentin, or muscle relaxant.  It was decided to start gabapentin 300mg  at bedtime x 1 week, then increase to 1 tablet twice daily  3.  Right great toe nail avulsion (by patient) with fresh wound - new.  I do not see any sign of infection.  He was instructed to keep the area clean, dry, and apply antibiotic ointment.  If there is any discoloration or drainage, follow-up with PCP or urgent care.   4.  Forgetfulness.  No signs of dementia as he remains highly independent.  I will discuss his cognitive complaints at a subsequent visit due to time constraints, as he had many questions about his other symptoms as mentioned above.   The duration of this appointment visit was 40 minutes of face-to-face time with the patient.  Greater than 50% of this time was spent in counseling, explanation of diagnosis, planning of further management, and coordination of care.   Thank you for allowing me to participate in patient's care.  If I can  answer any additional questions, I would be pleased to do so.    Sincerely,    Dantae Meunier K. Posey Pronto, DO

## 2018-08-01 NOTE — Patient Instructions (Addendum)
Start gabapentin 300mg  at bedtime for one week, then increase to 300mg  twice daily  Follow-up with primary care provider or urgent care if there is signs of injections or poor healing  Return to clinic in 4 months for follow-up visit for cognitive assessment

## 2018-08-29 ENCOUNTER — Ambulatory Visit: Payer: Federal, State, Local not specified - PPO | Admitting: Neurology

## 2018-09-24 DIAGNOSIS — M25562 Pain in left knee: Secondary | ICD-10-CM | POA: Insufficient documentation

## 2018-09-24 DIAGNOSIS — M25552 Pain in left hip: Secondary | ICD-10-CM | POA: Insufficient documentation

## 2018-10-21 ENCOUNTER — Ambulatory Visit: Payer: Federal, State, Local not specified - PPO | Admitting: Podiatry

## 2018-10-21 ENCOUNTER — Encounter: Payer: Self-pay | Admitting: Podiatry

## 2018-10-21 DIAGNOSIS — E1142 Type 2 diabetes mellitus with diabetic polyneuropathy: Secondary | ICD-10-CM | POA: Diagnosis not present

## 2018-10-21 DIAGNOSIS — B351 Tinea unguium: Secondary | ICD-10-CM | POA: Diagnosis not present

## 2018-10-21 DIAGNOSIS — M79674 Pain in right toe(s): Secondary | ICD-10-CM

## 2018-10-21 DIAGNOSIS — L84 Corns and callosities: Secondary | ICD-10-CM | POA: Diagnosis not present

## 2018-10-21 DIAGNOSIS — M79675 Pain in left toe(s): Secondary | ICD-10-CM | POA: Diagnosis not present

## 2018-10-21 NOTE — Patient Instructions (Signed)
Onychomycosis/Fungal Toenails  WHAT IS IT? An infection that lies within the keratin of your nail plate that is caused by a fungus.  WHY ME? Fungal infections affect all ages, sexes, races, and creeds.  There may be many factors that predispose you to a fungal infection such as age, coexisting medical conditions such as diabetes, or an autoimmune disease; stress, medications, fatigue, genetics, etc.  Bottom line: fungus thrives in a warm, moist environment and your shoes offer such a location.  IS IT CONTAGIOUS? Theoretically, yes.  You do not want to share shoes, nail clippers or files with someone who has fungal toenails.  Walking around barefoot in the same room or sleeping in the same bed is unlikely to transfer the organism.  It is important to realize, however, that fungus can spread easily from one nail to the next on the same foot.  HOW DO WE TREAT THIS?  There are several ways to treat this condition.  Treatment may depend on many factors such as age, medications, pregnancy, liver and kidney conditions, etc.  It is best to ask your doctor which options are available to you.  1. No treatment.   Unlike many other medical concerns, you can live with this condition.  However for many people this can be a painful condition and may lead to ingrown toenails or a bacterial infection.  It is recommended that you keep the nails cut short to help reduce the amount of fungal nail. 2. Topical treatment.  These range from herbal remedies to prescription strength nail lacquers.  About 40-50% effective, topicals require twice daily application for approximately 9 to 12 months or until an entirely new nail has grown out.  The most effective topicals are medical grade medications available through physicians offices. 3. Oral antifungal medications.  With an 80-90% cure rate, the most common oral medication requires 3 to 4 months of therapy and stays in your system for a year as the new nail grows out.  Oral  antifungal medications do require blood work to make sure it is a safe drug for you.  A liver function panel will be performed prior to starting the medication and after the first month of treatment.  It is important to have the blood work performed to avoid any harmful side effects.  In general, this medication safe but blood work is required. 4. Laser Therapy.  This treatment is performed by applying a specialized laser to the affected nail plate.  This therapy is noninvasive, fast, and non-painful.  It is not covered by insurance and is therefore, out of pocket.  The results have been very good with a 80-95% cure rate.  [facility] is the only practice in the area to offer this therapy. 5. Permanent Nail Avulsion.  Removing the entire nail so that a new nail will not grow back. Onychomycosis/Fungal Toenails  WHAT IS IT? An infection that lies within the keratin of your nail plate that is caused by a fungus.  WHY ME? Fungal infections affect all ages, sexes, races, and creeds.  There may be many factors that predispose you to a fungal infection such as age, coexisting medical conditions such as diabetes, or an autoimmune disease; stress, medications, fatigue, genetics, etc.  Bottom line: fungus thrives in a warm, moist environment and your shoes offer such a location.  IS IT CONTAGIOUS? Theoretically, yes.  You do not want to share shoes, nail clippers or files with someone who has fungal toenails.  Walking around   barefoot in the same room or sleeping in the same bed is unlikely to transfer the organism.  It is important to realize, however, that fungus can spread easily from one nail to the next on the same foot.  HOW DO WE TREAT THIS?  There are several ways to treat this condition.  Treatment may depend on many factors such as age, medications, pregnancy, liver and kidney conditions, etc.  It is best to ask your doctor which options are available to you.  6. No treatment.   Unlike many  other medical concerns, you can live with this condition.  However for many people this can be a painful condition and may lead to ingrown toenails or a bacterial infection.  It is recommended that you keep the nails cut short to help reduce the amount of fungal nail. 7. Topical treatment.  These range from herbal remedies to prescription strength nail lacquers.  About 40-50% effective, topicals require twice daily application for approximately 9 to 12 months or until an entirely new nail has grown out.  The most effective topicals are medical grade medications available through physicians offices. 8. Oral antifungal medications.  With an 80-90% cure rate, the most common oral medication requires 3 to 4 months of therapy and stays in your system for a year as the new nail grows out.  Oral antifungal medications do require blood work to make sure it is a safe drug for you.  A liver function panel will be performed prior to starting the medication and after the first month of treatment.  It is important to have the blood work performed to avoid any harmful side effects.  In general, this medication safe but blood work is required. 9. Laser Therapy.  This treatment is performed by applying a specialized laser to the affected nail plate.  This therapy is noninvasive, fast, and non-painful.  It is not covered by insurance and is therefore, out of pocket.  The results have been very good with a 80-95% cure rate.  [facility] is the only practice in the area to offer this therapy. 10. Permanent Nail Avulsion.  Removing the entire nail so that a new nail will not grow back.  Diabetic Neuropathy Diabetic neuropathy is a nerve disease or nerve damage that is caused by diabetes mellitus. About half of all people with diabetes mellitus have some form of nerve damage. Nerve damage is more common in those who have had diabetes mellitus for many years and who generally have not had good control of their blood  sugar (glucose) level. Diabetic neuropathy is a common complication of diabetes mellitus. There are three common types of diabetic neuropathy and a fourth type that is less common and less understood:  Peripheral neuropathy-This is the most common type of diabetic neuropathy. It causes damage to the nerves of the feet and legs first and then eventually the hands and arms. The damage affects the ability to sense touch.  Autonomic neuropathy-This type causes damage to the autonomic nervous system, which controls the following functions: ? Heartbeat. ? Body temperature. ? Blood pressure. ? Urination. ? Digestion. ? Sweating. ? Sexual function.  Focal neuropathy-Focal neuropathy can be painful and unpredictable and occurs most often in older adults with diabetes mellitus. It involves a specific nerve or one area and often comes on suddenly. It usually does not cause long-term problems.  Radiculoplexus neuropathy- Sometimes called lumbosacral radiculoplexus neuropathy, radiculoplexus neuropathy affects the nerves of the thighs, hips, buttocks, or legs.  It is more common in people with type 2 diabetes mellitus and in older men. It is characterized by debilitating pain, weakness, and atrophy, usually in the thigh muscles.  What are the causes? The cause of peripheral, autonomic, and focal neuropathies is diabetes mellitus that is uncontrolled and high glucose levels. The cause of radiculoplexus neuropathy is unknown. However, it is thought to be caused by inflammation related to uncontrolled glucose levels. What are the signs or symptoms? Peripheral Neuropathy Peripheral neuropathy develops slowly over time. When the nerves of the feet and legs no longer work there may be:  Burning, stabbing, or aching pain in the legs or feet.  Inability to feel pressure or pain in your feet. This can lead to: ? Thick calluses over pressure areas. ? Pressure sores. ? Ulcers.  Foot deformities.  Reduced  ability to feel temperature changes.  Muscle weakness.  Autonomic Neuropathy The symptoms of autonomic neuropathy vary depending on which nerves are affected. Symptoms may include:  Problems with digestion, such as: ? Feeling sick to your stomach (nausea). ? Vomiting. ? Bloating. ? Constipation. ? Diarrhea. ? Abdominal pain.  Difficulty with urination. This occurs if you lose your ability to sense when your bladder is full. Problems include: ? Urine leakage (incontinence). ? Inability to empty your bladder completely (retention).  Rapid or irregular heartbeat (palpitations).  Blood pressure drops when you stand up (orthostatic hypotension). When you stand up you may feel: ? Dizzy. ? Weak. ? Faint.  In men, inability to attain and maintain an erection.  In women, vaginal dryness and problems with decreased sexual desire and arousal.  Problems with body temperature regulation.  Increased or decreased sweating.  Focal Neuropathy  Abnormal eye movements or abnormal alignment of both eyes.  Weakness in the wrist.  Foot drop. This results in an inability to lift the foot properly and abnormal walking or foot movement.  Paralysis on one side of your face (Bell palsy).  Chest or abdominal pain. Radiculoplexus Neuropathy  Sudden, severe pain in your hip, thigh, or buttocks.  Weakness and wasting of thigh muscles.  Difficulty rising from a seated position.  Abdominal swelling.  Unexplained weight loss (usually more than 10 lb [4.5 kg]). How is this diagnosed? Peripheral Neuropathy Your senses may be tested. Sensory function testing can be done with:  A light touch using a monofilament.  A vibration with tuning fork.  A sharp sensation with a pin prick.  Other tests that can help diagnose neuropathy are:  Nerve conduction velocity. This test checks the transmission of an electrical current through a nerve.  Electromyography. This shows how muscles respond to  electrical signals transmitted by nearby nerves.  Quantitative sensory testing. This is used to assess how your nerves respond to vibrations and changes in temperature.  Autonomic Neuropathy Diagnosis is often based on reported symptoms. Tell your health care provider if you experience:  Dizziness.  Constipation.  Diarrhea.  Inappropriate urination or inability to urinate.  Inability to get or maintain an erection.  Tests that may be done include:  Electrocardiography or Holter monitor. These are tests that can help show problems with the heart rate or heart rhythm.  An X-ray exam may be done.  Focal Neuropathy Diagnosis is made based on your symptoms and what your health care provider finds during your exam. Other tests may be done. They may include:  Nerve conduction velocities. This checks the transmission of electrical current through a nerve.  Electromyography. This shows how muscles respond  to electrical signals transmitted by nearby nerves.  Quantitative sensory testing. This test is used to assess how your nerves respond to vibration and changes in temperature.  Radiculoplexus Neuropathy  Often the first thing is to eliminate any other issue or problems that might be the cause, as there is no standard test for diagnosis.  X-ray exam of your spine and lumbar region.  Spinal tap to rule out cancer.  MRI to rule out other lesions. How is this treated? Once nerve damage occurs, it cannot be reversed. The goal of treatment is to keep the disease or nerve damage from getting worse and affecting more nerve fibers. Controlling your blood glucose level is the key. Most people with radiculoplexus neuropathy see at least a partial improvement over time. You will need to keep your blood glucose and HbA1c levels in the target range determined by your health care provider. Things that help control blood glucose levels include:  Blood glucose monitoring.  Meal  planning.  Physical activity.  Diabetes medicine.  Over time, maintaining lower blood glucose levels helps lessen symptoms. Sometimes, prescription pain medicine is needed. Follow these instructions at home:  Do not smoke.  Keep your blood glucose level in the range that you and your health care provider have determined acceptable for you.  Keep your blood pressure level in the range that you and your health care provider have determined acceptable for you.  Eat a well-balanced diet.  Be physically active every day. Include strength training and balance exercises.  Protect your feet. ? Check your feet every day for sores, cuts, blisters, or signs of infection. ? Wear padded socks and supportive shoes. Use orthotic inserts, if necessary. ? Regularly check the insides of your shoes for worn spots. Make sure there are no rocks or other items inside your shoes before you put them on. Contact a health care provider if:  You have burning, stabbing, or aching pain in the legs or feet.  You are unable to feel pressure or pain in your feet.  You develop problems with digestion such as: ? Nausea. ? Vomiting. ? Bloating. ? Constipation. ? Diarrhea. ? Abdominal pain.  You have difficulty with urination, such as: ? Incontinence. ? Retention.  You have palpitations.  You develop orthostatic hypotension. When you stand up you may feel: ? Dizzy. ? Weak. ? Faint.  You cannot attain and maintain an erection (in men).  You have vaginal dryness and problems with decreased sexual desire and arousal (in women).  You have severe pain in your thighs, legs, or buttocks.  You have unexplained weight loss. This information is not intended to replace advice given to you by your health care provider. Make sure you discuss any questions you have with your health care provider. Document Released: 01/21/2002 Document Revised: 04/19/2016 Document Reviewed: 04/23/2013 Elsevier Interactive  Patient Education  2017 [employer].

## 2018-11-08 ENCOUNTER — Encounter: Payer: Self-pay | Admitting: Podiatry

## 2018-11-08 NOTE — Progress Notes (Signed)
Subjective: Darin Mcclure presents today with history of neuropathy with cc of painful, mycotic toenails.  Pain is aggravated when wearing enclosed shoe gear and relieved with periodic professional debridement.  Patient has peripheral neuropathy managed with gabapentin.  He is also on blood thinner, Plavix.  He is s/p Keller/McBride bunionectomy left foot in February 2019,  and says he is doing well.  Darin Baton, MD is Darin Mcclure's PCP.     Current Outpatient Medications:  .  aspirin EC 81 MG tablet, Take 81 mg by mouth daily., Disp: , Rfl:  .  atorvastatin (LIPITOR) 10 MG tablet, Take 10 mg by mouth daily., Disp: , Rfl: 2 .  cetirizine (ZYRTEC) 10 MG tablet, Take 10 mg by mouth daily as needed. , Disp: , Rfl:  .  clopidogrel (PLAVIX) 75 MG tablet, Take 75 mg by mouth daily., Disp: , Rfl: 2 .  fluticasone (FLONASE) 50 MCG/ACT nasal spray, Place 1 spray into both nostrils daily as needed. , Disp: , Rfl: 3 .  gabapentin (NEURONTIN) 300 MG capsule, Take 1 tablet at bedtime for one week, then increase to 1 tablet twice daily., Disp: 60 capsule, Rfl: 11 .  JANUVIA 100 MG tablet, Take 100 mg by mouth daily., Disp: , Rfl: 3 .  losartan (COZAAR) 100 MG tablet, Take 100 mg by mouth daily., Disp: , Rfl: 2 .  Magnesium 500 MG CAPS, Take 1,000 mg by mouth., Disp: , Rfl:  .  metFORMIN (GLUCOPHAGE) 1000 MG tablet, TAKE 1 TABLET BY MOUTH 2 TIMES DAILY WITH MEALS, Disp: , Rfl: 2 .  Methylcellulose, Laxative, (CITRUCEL PO), Take by mouth., Disp: , Rfl:  .  metoprolol succinate (TOPROL-XL) 50 MG 24 hr tablet, Take 100 mg by mouth daily., Disp: , Rfl: 2 .  Multiple Vitamin (MULTIVITAMIN) tablet, Take 1 tablet by mouth daily., Disp: , Rfl:  .  Omega-3 Fatty Acids (OMEGA-3 FISH OIL PO), Take by mouth., Disp: , Rfl:  .  ONE TOUCH ULTRA TEST test strip, USE 1 TO 2 TIMES A DAY, Disp: , Rfl: 5 .  Probiotic Product (PROBIOTIC DAILY PO), Take by mouth., Disp: , Rfl:   No Known Allergies  ROS:  Neuro:  numbness b/l feet  Objective:  Vascular Examination: Capillary refill time immediate x 10 digits Dorsalis pedis and Posterior tibial pulses palpable b/l Digital hair x 10 digits was present Skin temperature gradient WNL b/l  Dermatological Examination: Skin with normal turgor, texture and tone b/l  Well healed surgical scar along dorsal aspect left 1st metatarsal  Toenails 1-5 b/l discolored, thick, dystrophic with subungual debris and pain with palpation to nailbeds due to thickness of nails.  Hyperkeratotic lesion plantar IPJ b/l great toes  Musculoskeletal: Muscle strength 5/5 to all muscle groups b/l  Hallux hammertoe right great toe  Neurological: Sensation with 10 gram monofilament is diminished b/l Vibratory sensation diminished b/l  Assessment: 1. Painful onychomycosis toenails 1-5 b/l 2. Callux b/l hallux 3. NIDDM with neuropathy  Plan: 1. Toenails 1-5 b/l were debrided in length and girth without iatrogenic bleeding. 2. Calluses pared b/l hallux without incident. 3. Patient to continue soft, supportive shoe gear 4. Patient to report any pedal injuries to medical professional  5. Follow up 3 months. Patient/POA to call should there be a concern in the interim.

## 2018-11-13 ENCOUNTER — Ambulatory Visit: Payer: Federal, State, Local not specified - PPO | Admitting: Neurology

## 2018-11-26 IMAGING — CR DG SHOULDER 2+V*R*
3 series · 3 of 3 positions shown · non-contrast
Comparison: None.

CLINICAL DATA: Chronic BILAT shoulder pain / L > R / can not lie on
LEFT side / no trauma / concern for OA / jdh [id number]

EXAM:
RIGHT SHOULDER - 2+ VIEW

[w shoulder grashey right]
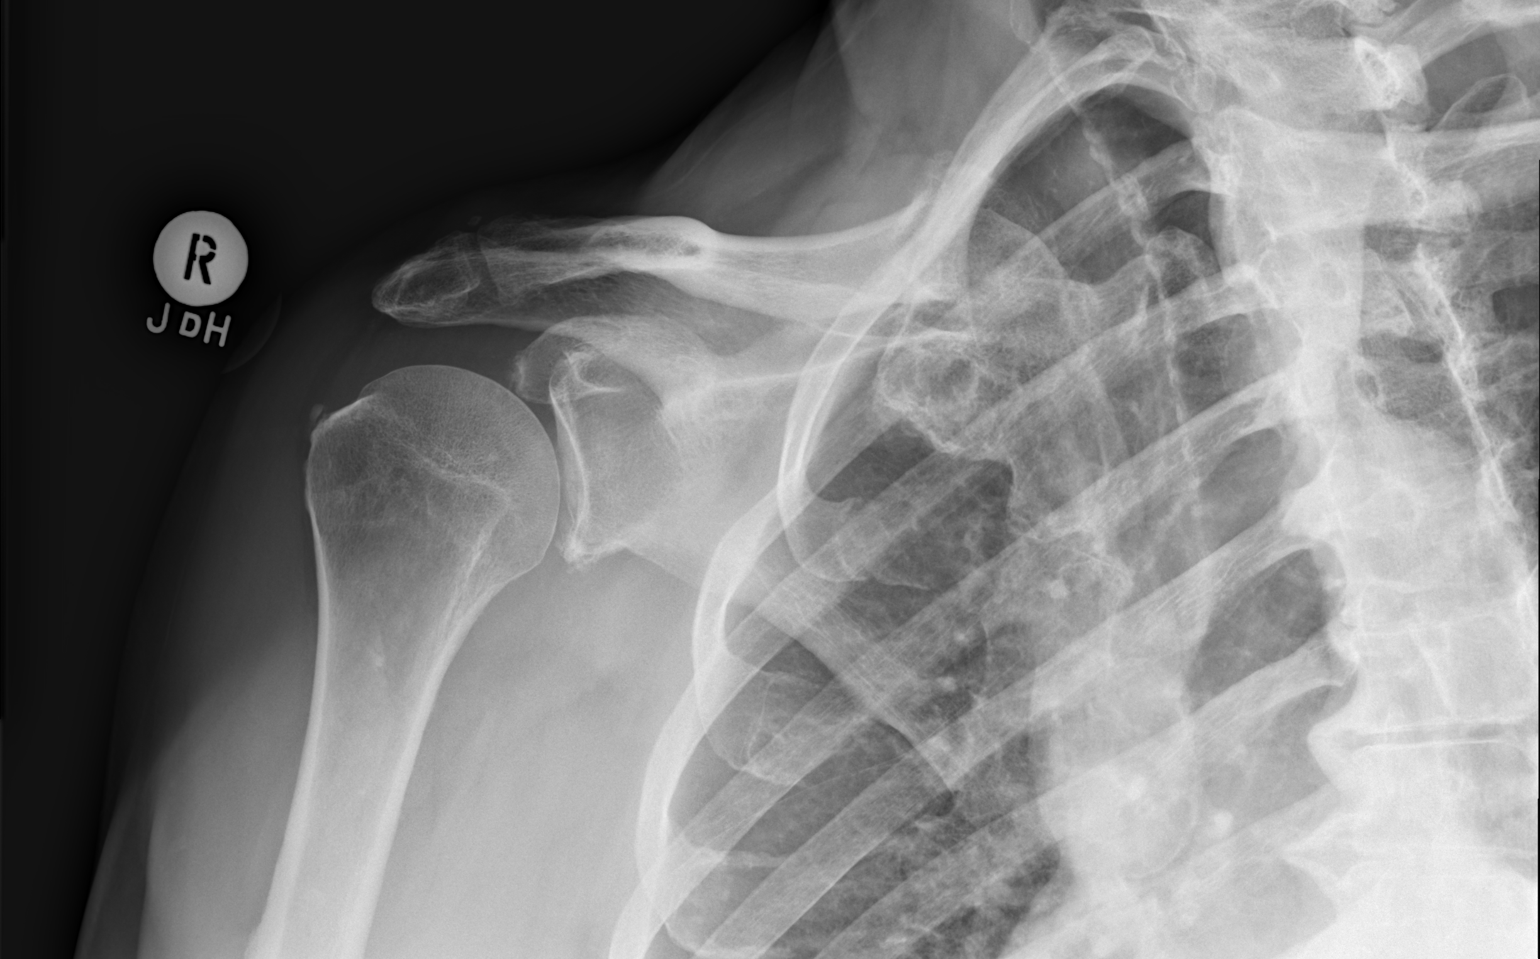

[w shoulder y-view right]
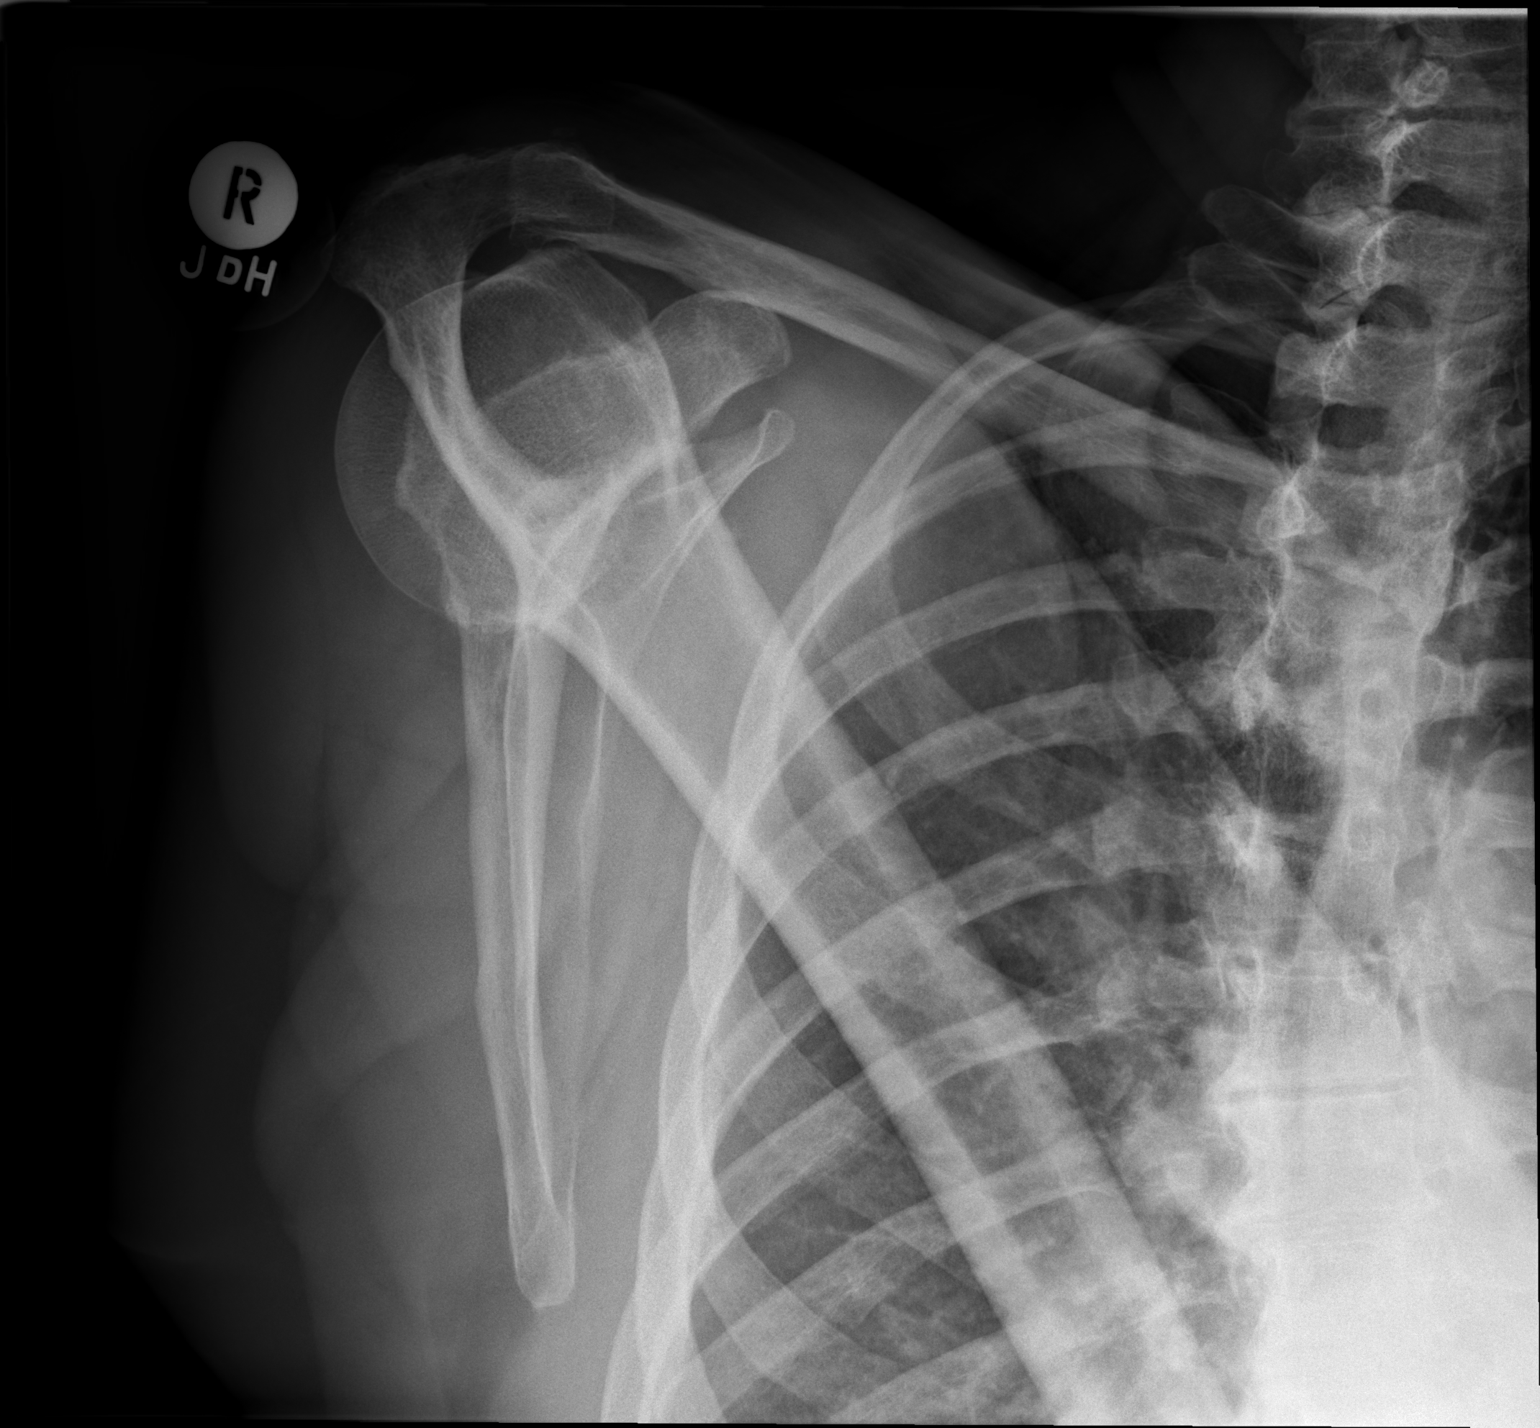

[w shoulder axillary right]
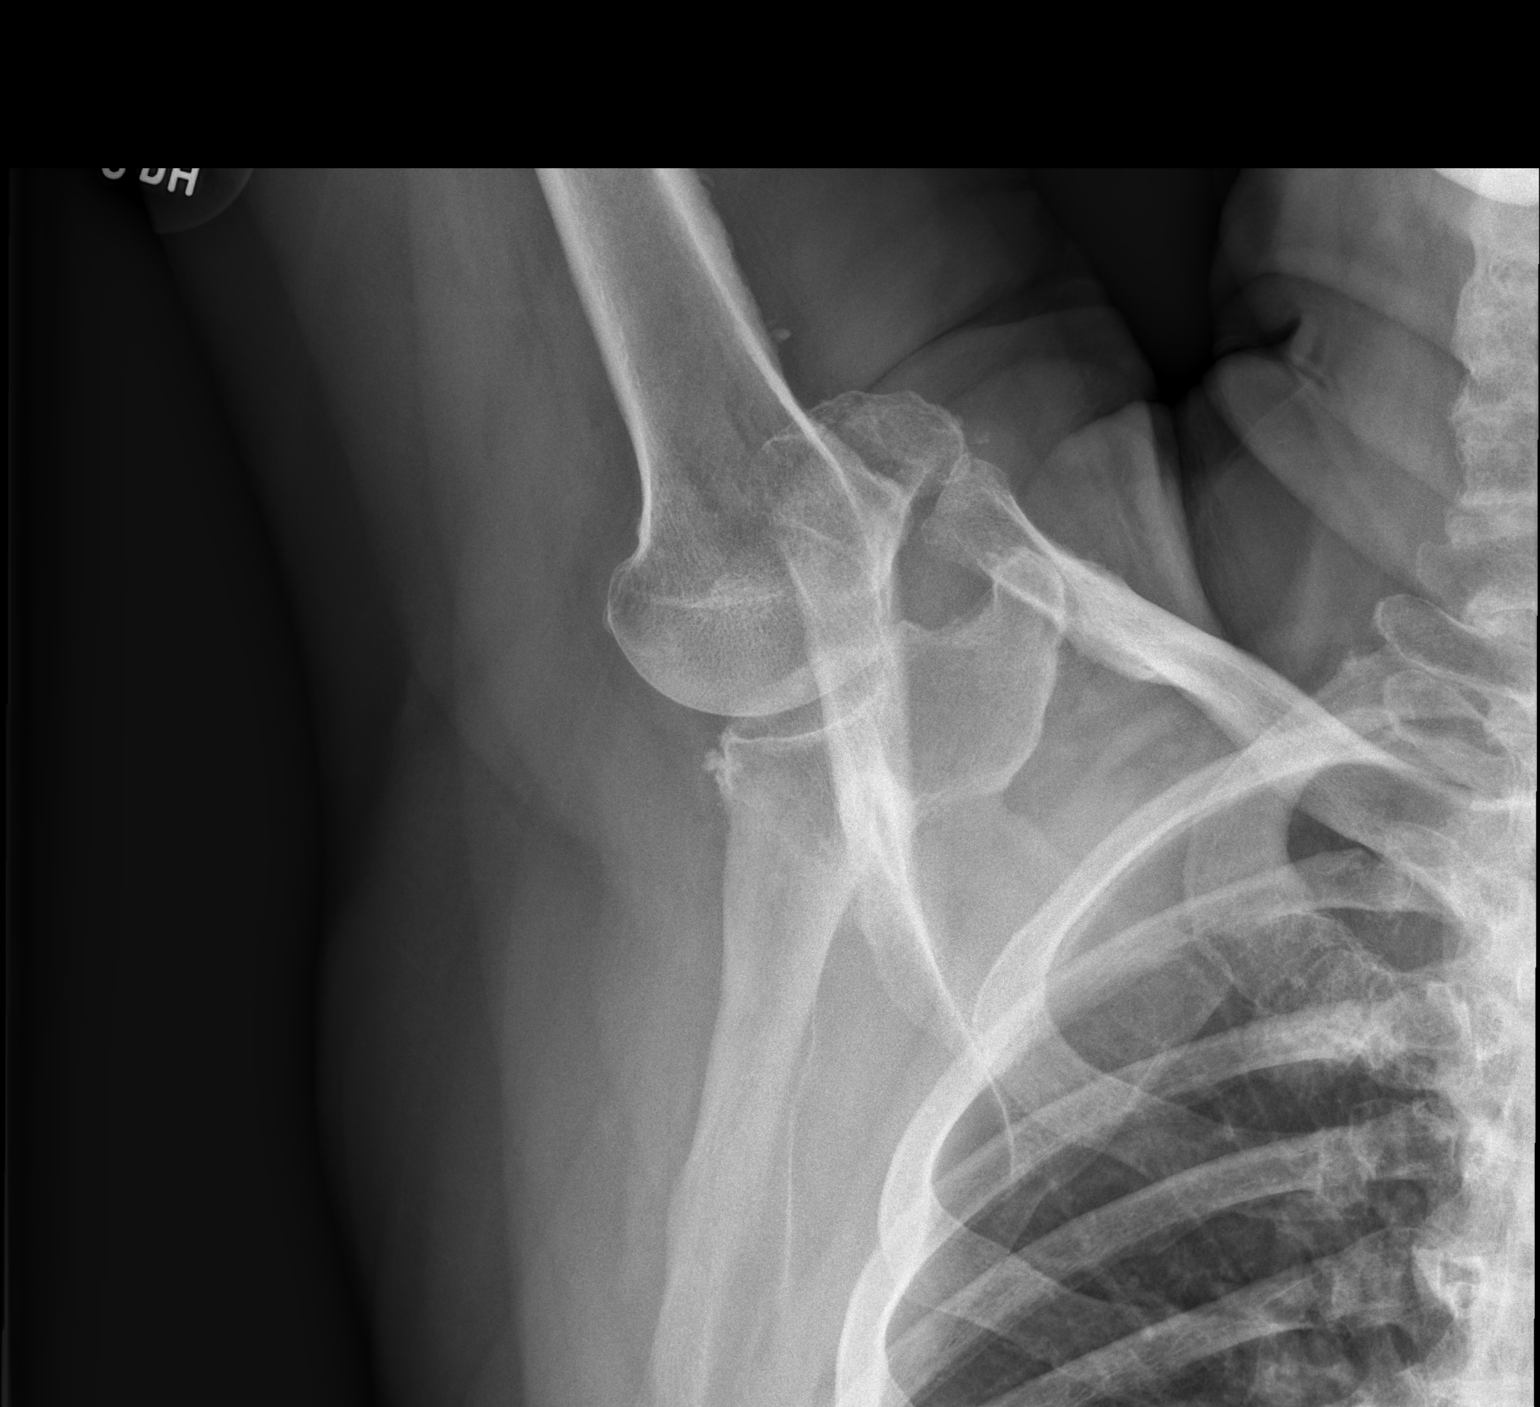

[3 of 3 positions shown; findings below may reference images not displayed]

FINDINGS: There is glenohumeral joint narrowing and spurring. No acute
fracture or subluxation. Note is made of thoracic spondylosis.
IMPRESSION: 1. Degenerative changes.
2.  No evidence for acute  abnormality.

## 2018-12-03 ENCOUNTER — Ambulatory Visit: Payer: Federal, State, Local not specified - PPO | Admitting: Neurology

## 2018-12-03 ENCOUNTER — Encounter

## 2018-12-15 ENCOUNTER — Encounter: Payer: Self-pay | Admitting: Cardiology

## 2018-12-15 ENCOUNTER — Ambulatory Visit (HOSPITAL_COMMUNITY)
Admission: RE | Admit: 2018-12-15 | Discharge: 2018-12-15 | Disposition: A | Discharge status: Home / Self Care | Payer: Federal, State, Local not specified - PPO | Source: Ambulatory Visit | Attending: Internal Medicine | Admitting: Internal Medicine

## 2018-12-15 ENCOUNTER — Encounter (INDEPENDENT_AMBULATORY_CARE_PROVIDER_SITE_OTHER): Payer: Self-pay

## 2018-12-15 ENCOUNTER — Ambulatory Visit: Payer: Federal, State, Local not specified - PPO | Admitting: Cardiology

## 2018-12-15 VITALS — BP 136/72 | HR 60 | Ht 72.0 in | Wt 252.8 lb

## 2018-12-15 DIAGNOSIS — Z86010 Personal history of colonic polyps: Secondary | ICD-10-CM

## 2018-12-15 DIAGNOSIS — I10 Essential (primary) hypertension: Secondary | ICD-10-CM

## 2018-12-15 DIAGNOSIS — E785 Hyperlipidemia, unspecified: Secondary | ICD-10-CM | POA: Diagnosis not present

## 2018-12-15 DIAGNOSIS — I2510 Atherosclerotic heart disease of native coronary artery without angina pectoris: Secondary | ICD-10-CM

## 2018-12-15 DIAGNOSIS — H539 Unspecified visual disturbance: Secondary | ICD-10-CM | POA: Insufficient documentation

## 2018-12-15 NOTE — Progress Notes (Signed)
Cardiology Office Note:    Date:  12/15/2018   ID:  Earl Losee, DOB 10-16-1941, MRN [id number]  PCP:  Shon Baton, MD  Cardiologist:  Candee Furbish, MD  Electrophysiologist:  None   Referring MD: Shon Baton, MD     History of Present Illness:    Darin Mcclure is a 78 y.o. male with here for the follow-up of coronary artery disease, Cypher 3 mm stent placed 10/04/2006 in mid right coronary artery.  Ostium of left main was normal by intravascular ultrasound.  He moved from [place].  Subsequently in 09/29/2008 he had another cardiac catheterization that showed normal right coronary artery stent in the mid region.  EF was normal.  He had moderate disease involving the LAD and diagonal and circumflex branches.  Has diabetes with hypertension and hyperlipidemia.  Prior carotid duplex showed less than 39% stenosis bilaterally.  Calcified stable plaque was noted on the left carotid bulb.  He works for [employer] for 50 years and moved to [place] to be with the grandkids.  Had some unusual left eye blurriness with headache that lasted about a week, back of his left neck was tender to the touch.  Saw the eye physician.  Did not see anything unusual but stated that he should come by the office if he is having 1 of these episodes.  Had lengthy discussion about stenting and stable coronary artery disease.  Past Medical History:  Diagnosis Date  . Coronary artery disease   . Diabetic polyneuropathy associated with diabetes mellitus due to underlying condition ([place]) 08/26/2017  . Heart disease   . Hyperglycemia   . Hypertension   . Mixed hyperlipidemia   . Muscle cramp 08/26/2017  . Neuropathy   . Osteoarthritis   . RBBB   . RBBB (right bundle branch block)   . Type 2 diabetes mellitus ([place])     Past Surgical History:  Procedure Laterality Date  . ACHILLES TENDON REPAIR Right   . BACK SURGERY    . CORONARY ANGIOPLASTY     pror stent to mid RCA 10/04/2006  . INGUINAL HERNIA  REPAIR    . PERCUTANEOUS CORONARY STENT INTERVENTION (PCI-S)    . REPLACEMENT TOTAL KNEE Left   . TOTAL HIP ARTHROPLASTY Right   . TOTAL HIP ARTHROPLASTY Left     Current Medications: Current Meds  Medication Sig  . aspirin EC 81 MG tablet Take 81 mg by mouth daily.  Marland Kitchen atorvastatin (LIPITOR) 10 MG tablet Take 10 mg by mouth daily.  . cetirizine (ZYRTEC) 10 MG tablet Take 10 mg by mouth daily as needed.   . clopidogrel (PLAVIX) 75 MG tablet Take 75 mg by mouth daily.  . fluticasone (FLONASE) 50 MCG/ACT nasal spray Place 1 spray into both nostrils daily as needed.   Marland Kitchen JANUVIA 100 MG tablet Take 100 mg by mouth daily.  Marland Kitchen losartan (COZAAR) 100 MG tablet Take 100 mg by mouth daily.  . Magnesium 500 MG CAPS Take 1,000 mg by mouth.  . metFORMIN (GLUCOPHAGE) 1000 MG tablet TAKE 1 TABLET BY MOUTH 2 TIMES DAILY WITH MEALS  . Methylcellulose, Laxative, (CITRUCEL PO) Take by mouth.  . metoprolol succinate (TOPROL-XL) 50 MG 24 hr tablet Take 100 mg by mouth daily.  . Multiple Vitamin (MULTIVITAMIN) tablet Take 1 tablet by mouth daily.  . Omega-3 Fatty Acids (OMEGA-3 FISH OIL PO) Take by mouth.  . ONE TOUCH ULTRA TEST test strip USE 1 TO 2 TIMES A DAY  . Probiotic Product (PROBIOTIC DAILY  PO) Take by mouth.     Allergies:   Patient has no known allergies.   Social History   Socioeconomic History  . Marital status: Single    Spouse name: Not on file  . Number of children: 2  . Years of education: Not on file  . Highest education level: Not on file  Occupational History  . Not on file  Social Needs  . Financial resource strain: Not on file  . Food insecurity:    Worry: Not on file    Inability: Not on file  . Transportation needs:    Medical: Not on file    Non-medical: Not on file  Tobacco Use  . Smoking status: Never Smoker  . Smokeless tobacco: Never Used  Substance and Sexual Activity  . Alcohol use: Yes    Comment: Rarely  . Drug use: No  . Sexual activity: Not on file    Lifestyle  . Physical activity:    Days per week: Not on file    Minutes per session: Not on file  . Stress: Not on file  Relationships  . Social connections:    Talks on phone: Not on file    Gets together: Not on file    Attends religious service: Not on file    Active member of club or organization: Not on file    Attends meetings of clubs or organizations: Not on file    Relationship status: Not on file  Other Topics Concern  . 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      Family History: The patient's family history includes CAD in his father.  ROS:   Please see the history of present illness.    Denies any fevers chills nausea vomiting syncope bleeding all other systems reviewed and are negative.  EKGs/Labs/Other Studies Reviewed:    The following studies were reviewed today: Prior office notes lab work EKG  EKG:  EKG is  ordered today.  The ekg ordered today demonstrates sinus rhythm 60 right bundle branch block no other significant abnormalities prior EKG showed right bundle branch block 64 with no other abnormalities.  Recent Labs: 12/25/2017: ALT 29; BUN 28; Creatinine, Ser 1.27; Hemoglobin 13.7; Platelets 161; Potassium 4.8; Sodium 137  Recent Lipid Panel    Component Value Date/Time   CHOL 100 12/25/2017 0845   TRIG 166 (H) 12/25/2017 0845   HDL 36 (L) 12/25/2017 0845   CHOLHDL 2.8 12/25/2017 0845   LDLCALC 31 12/25/2017 0845    Physical Exam:    VS:  BP 136/72   Pulse 60   Ht 6' (1.829 m)   Wt 252 lb 12.8 oz (114.7 kg)   BMI 34.29 kg/m     Wt Readings from Last 3 Encounters:  12/15/18 252 lb 12.8 oz (114.7 kg)  08/01/18 252 lb 4 oz (114.4 kg)  12/20/17 248 lb 6.4 oz (112.7 kg)     GEN:  Well nourished, well developed in no acute distress HEENT: Normal NECK: No JVD; No carotid bruits LYMPHATICS: No  lymphadenopathy CARDIAC: RRR, no murmurs, rubs, gallops RESPIRATORY:  Clear to auscultation without rales, wheezing or rhonchi  ABDOMEN: Soft, non-tender, non-distended MUSCULOSKELETAL:  No edema; No deformity  SKIN: Warm and dry NEUROLOGIC:  Alert and oriented x 3 PSYCHIATRIC:  Normal affect   ASSESSMENT:  1. Coronary artery disease involving native coronary artery of native heart without angina pectoris   2. Essential hypertension   3. Hyperlipidemia, unspecified hyperlipidemia type   4. History of colon polyps   5. Visual changes    PLAN:    In order of problems listed above:  Coronary artery disease -Mid right coronary artery early generation drug-eluting stent, Cypher 3.0.  2007.  Repeat cath 2009 patent.  Doing well without any exertional anginal symptoms.  Continue with aggressive secondary risk factor prevention.  Continue with Plavix. -Stress test in 2016 overall reassuring, low risk. - Did not have any symptoms prior to his stent in 2007.  Essential hypertension -Medications reviewed under good control.  No changes made.  Hyperlipidemia -Continue with statin therapy.  No myalgias.  He is on atorvastatin 10 mg placed on by his prior cardiologist.  His LDL is 31.  Diabetes with hypertension - Overall well controlled, metformin.  Leg cramps - Magnesium supplementation has helped.  Left eye visual change  - Carotid Dopplers  Obesity -Continue to encourage weight loss, decrease carbohydrates.   Medication Adjustments/Labs and Tests Ordered: Current medicines are reviewed at length with the patient today.  Concerns regarding medicines are outlined above.  Orders Placed This Encounter  Procedures  . Ambulatory referral to Gastroenterology  . EKG 12-Lead   No orders of the defined types were placed in this encounter.   Patient Instructions  Medication Instructions:  The current medical regimen is effective;  continue present plan and medications.  If you  need a refill on your cardiac medications before your next appointment, please call your pharmacy.   Testing/Procedures: Your physician has requested that you have a carotid duplex. This test is an ultrasound of the carotid arteries in your neck. It looks at blood flow through these arteries that supply the brain with blood. Allow one hour for this exam. There are no restrictions or special instructions.  Follow-Up: At [facility], you and your health needs are our priority.  As part of our continuing mission to provide you with exceptional heart care, we have created designated Provider Care Teams.  These Care Teams include your primary Cardiologist (physician) and Advanced Practice Providers (APPs -  Physician Assistants and Nurse Practitioners) who all work together to provide you with the care you need, when you need it. You will need a follow up appointment in 12 months.  Please call our office 2 months in advance to schedule this appointment.  You may see Candee Furbish, MD or one of the following Advanced Practice Providers on your designated Care Team:   Truitt Merle, NP Cecilie Kicks, NP . Kathyrn Drown, NP  You have been referred to [facility] at your request due to your history of polyps.  Thank you for choosing [facility]!!        Signed, Candee Furbish, MD  12/15/2018 10:10 AM    [employer]

## 2018-12-15 NOTE — Patient Instructions (Signed)
Medication Instructions:  The current medical regimen is effective;  continue present plan and medications.  If you need a refill on your cardiac medications before your next appointment, please call your pharmacy.   Testing/Procedures: Your physician has requested that you have a carotid duplex. This test is an ultrasound of the carotid arteries in your neck. It looks at blood flow through these arteries that supply the brain with blood. Allow one hour for this exam. There are no restrictions or special instructions.  Follow-Up: At [facility], you and your health needs are our priority.  As part of our continuing mission to provide you with exceptional heart care, we have created designated Provider Care Teams.  These Care Teams include your primary Cardiologist (physician) and Advanced Practice Providers (APPs -  Physician Assistants and Nurse Practitioners) who all work together to provide you with the care you need, when you need it. You will need a follow up appointment in 12 months.  Please call our office 2 months in advance to schedule this appointment.  You may see Candee Furbish, MD or one of the following Advanced Practice Providers on your designated Care Team:   Truitt Merle, NP Cecilie Kicks, NP . Kathyrn Drown, NP  You have been referred to [facility] at your request due to your history of polyps.  Thank you for choosing [facility]!!

## 2018-12-17 ENCOUNTER — Ambulatory Visit: Payer: Federal, State, Local not specified - PPO | Admitting: Neurology

## 2018-12-17 ENCOUNTER — Encounter: Payer: Self-pay | Admitting: Neurology

## 2018-12-17 ENCOUNTER — Other Ambulatory Visit (INDEPENDENT_AMBULATORY_CARE_PROVIDER_SITE_OTHER): Payer: Federal, State, Local not specified - PPO

## 2018-12-17 VITALS — BP 140/70 | HR 58 | Ht 72.0 in | Wt 252.4 lb

## 2018-12-17 DIAGNOSIS — H539 Unspecified visual disturbance: Secondary | ICD-10-CM

## 2018-12-17 DIAGNOSIS — E1142 Type 2 diabetes mellitus with diabetic polyneuropathy: Secondary | ICD-10-CM | POA: Diagnosis not present

## 2018-12-17 DIAGNOSIS — R4189 Other symptoms and signs involving cognitive functions and awareness: Secondary | ICD-10-CM | POA: Diagnosis not present

## 2018-12-17 DIAGNOSIS — M5481 Occipital neuralgia: Secondary | ICD-10-CM

## 2018-12-17 DIAGNOSIS — G3184 Mild cognitive impairment, so stated: Secondary | ICD-10-CM

## 2018-12-17 LAB — TSH: TSH: 3.37 u[IU]/mL (ref 0.35–4.50)

## 2018-12-17 LAB — SEDIMENTATION RATE: Sed Rate: 24 mm/hr — ABNORMAL HIGH (ref 0–20)

## 2018-12-17 LAB — C-REACTIVE PROTEIN: CRP: 0.9 mg/dL (ref 0.5–20.0)

## 2018-12-17 LAB — HEMOGLOBIN A1C: Hgb A1c MFr Bld: 6.9 % — ABNORMAL HIGH (ref 4.6–6.5)

## 2018-12-17 NOTE — Addendum Note (Signed)
Addended by: Chester Holstein on: 12/17/2018 09:52 AM   Modules accepted: Orders

## 2018-12-17 NOTE — Patient Instructions (Addendum)
Check labs  We will call you with the results

## 2018-12-17 NOTE — Progress Notes (Signed)
Follow-up Visit   Date: 12/17/18    Darin Mcclure MRN: [id number] DOB: Sep 19, 1941   Interim History: Darin Mcclure is a 78 y.o. right-handed Caucasian male with hypertensin, hyperlipidemia, diabetes mellitus, CAD s/p PCI with stent, and GERD returning to the clinic for follow-up of neuropathy and new complaints of right toe nail avulsion and left scalp paresthesias.  The patient was accompanied to the clinic by self.    History of present illness: He started having numbness of toes, which had progressed over the years to involve his foot and lower leg.  He was diagnosed with neuropathy and seen at [facility] and many years later, was diagnosed with diabetes mellitus.  He complains that his left foot feels heavy, especially when he wakes up.  He has very brief spells of pulsating sharp pain, lasting 30-min to several hours which can affect both legs.  He has not noticed any triggers.  He has noticed greater difficulty with balance, especially when his eyes are closed.  He has not suffered any falls and walks unassisted.  He was offered gabapentin, but he did not take this because his pain is very sporadic.   He saw Dr. Yehuda Savannah, [facility]ito. while living in [place] and moved here in July 2018.  He had NCS/EMG which confirmed neuropathy.    Rarely, he has spells of left lateral lower leg cramps which can be severe.  He takes magnesium oxide for cramps.  UPDATE 08/01/2018: He is here for one-year follow-up visit.  He has noted mild progression of neuropathy especially numbness and tight sensation of the lower extremities.  The sensation can be worse at rest and is in the evening.  He is unable to feel anything in the feet due to the severity of numbness and unfortunately, stubbed his right toe few weeks ago.  The toenail has become black and started pulling away from the skin, so he removed it yesterday.  He would like for me to take a look at his toe.  He has not seen his primary care  provider for this.  He feels that his balance is slightly worse.  He continues to walk unassisted and has not had any falls.   Since his last visit.  He has noticed new left-sided shooting sensation that starts at the base of the scalp and radiates up towards his left ear.  It occurs daily and lasts about a minute.  No neck pain or arm weakness.   He also complained of being forgetful at times, such as walking into the room and forgetting the reason for being there.  He is able to maintain all instrumental ADLs such as driving, managing finances, and household chores.  UPDATE 12/17/2018:  He scheduled visit today specifically to address that he is getting more forgetful, especially with names.  Sometimes, he can walk into a room and forget the reason for being there.  He denies sleep problems or mood changes.  He denies problems with managing finances or driving.  He is not getting lost.  He is not having difficulty with processing information.  He lives at home with his partner.    He continues to complain of left-sided shooting pain at the base of the scalp and over the left ear.  It can occur for a few days and then resolve.  Sometimes, when this happens he has blurred vision in the left eye.  No visual loss.    Medications:  Current Outpatient Medications on File Prior to  Visit  Medication Sig Dispense Refill  . aspirin EC 81 MG tablet Take 81 mg by mouth daily.    Marland Kitchen atorvastatin (LIPITOR) 10 MG tablet Take 10 mg by mouth daily.  2  . cetirizine (ZYRTEC) 10 MG tablet Take 10 mg by mouth daily as needed.     . clopidogrel (PLAVIX) 75 MG tablet Take 75 mg by mouth daily.  2  . fluticasone (FLONASE) 50 MCG/ACT nasal spray Place 1 spray into both nostrils daily as needed.   3  . JANUVIA 100 MG tablet Take 100 mg by mouth daily.  3  . losartan (COZAAR) 100 MG tablet Take 100 mg by mouth daily.  2  . Magnesium 500 MG CAPS Take 1,000 mg by mouth.    . metFORMIN (GLUCOPHAGE) 1000 MG tablet TAKE 1  TABLET BY MOUTH 2 TIMES DAILY WITH MEALS  2  . Methylcellulose, Laxative, (CITRUCEL PO) Take by mouth.    . metoprolol succinate (TOPROL-XL) 50 MG 24 hr tablet Take 100 mg by mouth daily.  2  . Multiple Vitamin (MULTIVITAMIN) tablet Take 1 tablet by mouth daily.    . Omega-3 Fatty Acids (OMEGA-3 FISH OIL PO) Take by mouth.    . ONE TOUCH ULTRA TEST test strip USE 1 TO 2 TIMES A DAY  5  . Probiotic Product (PROBIOTIC DAILY PO) Take by mouth.     No current facility-administered medications on file prior to visit.     Allergies: No Known Allergies  Review of Systems:  CONSTITUTIONAL: No fevers, chills, night sweats, or weight loss.  EYES: +visual changes or eye pain ENT: No hearing changes.  No history of nose bleeds.   RESPIRATORY: No cough, wheezing and shortness of breath.   CARDIOVASCULAR: Negative for chest pain, and palpitations.   GI: Negative for abdominal discomfort, blood in stools or black stools.  No recent change in bowel habits.   GU:  No history of incontinence.   MUSCLOSKELETAL: No history of joint pain or swelling.  No myalgias.   SKIN: Negative for lesions, rash, and itching.   ENDOCRINE: Negative for cold or heat intolerance, polydipsia or goiter.   PSYCH:  No depression or anxiety symptoms.   NEURO: As Above.   Vital Signs:  BP 140/70   Pulse (!) 58   Ht 6' (1.829 m)   Wt 252 lb 6 oz (114.5 kg)   SpO2 96%   BMI 34.23 kg/m   General Medical Exam:   General:  Well appearing, comfortable  Eyes/ENT: see cranial nerve examination.  No ropiness of the left temporal artery Neck: No masses appreciated.  There is no tenderness over the left LON or GON. Full range of motion without tenderness.  No carotid bruits. Respiratory:  Clear to auscultation, good air entry bilaterally.   Cardiac:  Regular rate and rhythm, no murmur.   Ext:  No edema  Neurological Exam: MENTAL STATUS including orientation to time, place, person, recent and remote memory, attention span and  concentration, language, and fund of knowledge is normal.  Speech is not dysarthric.  Montreal Cognitive Assessment  12/17/2018  Visuospatial/ Executive (0/5) 5  Naming (0/3) 3  Attention: Read list of digits (0/2) 2  Attention: Read list of letters (0/1) 1  Attention: Serial 7 subtraction starting at 100 (0/3) 3  Language: Repeat phrase (0/2) 2  Language : Fluency (0/1) 1  Abstraction (0/2) 2  Delayed Recall (0/5) 2  Orientation (0/6) 6  Total 27  Adjusted Score (based on  education) 27   CRANIAL NERVES:    Face is symmetric.  MOTOR:  Motor strength is 5/5 in all extremities. No pronator drift.  Tone is normal.    COORDINATION/GAIT:   Gait narrow based and stable, mildly wide-based.  Unassisted  Data: Labs 08/03/2016:  Folate >24, TSH 2.37, Lyme neg, RF neg, CCP neg, HbA1c 6.3, ANA neg, vitamin B12 309 Labs 08/26/2017:  Vitamin B12 298, copper 78, MMA 165, SPEP with IFE no M protein  TCD 08/03/2016:  Normal EEG 08/03/2016:  Normal  Cognitive testing 08/03/2016:  Normal NCS/EMG of the legs 08/03/2016:  Diffuse sensorimotor polyneuropathy US carotids 12/15/2018:  1-39% bilateral ICA stenosis  Lab Results  Component Value Date   VITAMINB12 298 08/26/2017    IMPRESSION/PLAN: 1.  Mild cognitive impairment, amnestic.  He performed well on cognitive screening, scoring 27/30, missing 3 points on delayed recall.  Patient was reassured that he does not have any signs of dementia.  Check vitamin B12 and TSH for treatable causes of memory changes.   2.  Left lesser occipital neuralgia - new.  Now with complaints of left blurred vision, so I will screen him for temporal arteritis with ESR, CRP.  If normal, he was offered nerve block which was declined.  If pain returns, can try one week course of prednisone.     3.  Diabetic peripheral neuropathy manifesting with numbness of the legs and very mild sensory ataxia.  Continue to follow.  Greater than 50% of this 30 minute visit was spent in  counseling, explanation of diagnosis, planning of further management, and coordination of care.    Thank you for allowing me to participate in patient's care.  If I can answer any additional questions, I would be pleased to do so.    Sincerely,    Donika K. Posey Pronto, DO

## 2018-12-18 LAB — VITAMIN B12: Vitamin B-12: 391 pg/mL (ref 211–911)

## 2018-12-19 ENCOUNTER — Telehealth: Payer: Self-pay

## 2018-12-19 NOTE — Telephone Encounter (Signed)
ROI fax to [facility] for records

## 2018-12-22 ENCOUNTER — Telehealth: Payer: Self-pay | Admitting: *Deleted

## 2018-12-22 ENCOUNTER — Ambulatory Visit: Payer: Federal, State, Local not specified - PPO | Admitting: Cardiology

## 2018-12-22 NOTE — Telephone Encounter (Signed)
Called patient and left message giving results.

## 2018-12-22 NOTE — Telephone Encounter (Signed)
-----   Message from Alda Berthold, DO sent at 12/18/2018 11:46 AM EST ----- Please inform patient that his labs are normal and do not show signs of inflammation causing his head pain, thyroid problems, or vitamin B12 deficiency.  His HbA1c is 6.9 - please fax results to his PCP for their records.  Thanks.

## 2018-12-29 NOTE — Telephone Encounter (Signed)
Rec'd from [facility] forwarded 75 pages to Historical Provider

## 2018-12-31 ENCOUNTER — Telehealth: Payer: Self-pay | Admitting: Internal Medicine

## 2019-01-01 NOTE — Telephone Encounter (Signed)
Suggest he see one of my colleagues I am limiting new patients due to my current backlog of recalls and size of practice

## 2019-01-19 NOTE — Telephone Encounter (Signed)
Due to Dr. Fuller Plan current backlog, patient states he would like to see Dr. Havery Moros. Records have been placed on your deck. Please advise if okay to schedule for an office visit.

## 2019-01-19 NOTE — Telephone Encounter (Signed)
I don't see any records on my desk for this patient to review. I will review once that has been done, happy to see this patient. Thanks

## 2019-01-20 ENCOUNTER — Ambulatory Visit: Payer: Federal, State, Local not specified - PPO | Admitting: Podiatry

## 2019-01-20 DIAGNOSIS — M79674 Pain in right toe(s): Secondary | ICD-10-CM

## 2019-01-20 DIAGNOSIS — M79675 Pain in left toe(s): Secondary | ICD-10-CM

## 2019-01-20 DIAGNOSIS — B351 Tinea unguium: Secondary | ICD-10-CM | POA: Diagnosis not present

## 2019-01-20 DIAGNOSIS — E1142 Type 2 diabetes mellitus with diabetic polyneuropathy: Secondary | ICD-10-CM

## 2019-01-20 DIAGNOSIS — L84 Corns and callosities: Secondary | ICD-10-CM

## 2019-01-20 NOTE — Patient Instructions (Signed)

## 2019-01-23 ENCOUNTER — Encounter: Payer: Self-pay | Admitting: Gastroenterology

## 2019-01-24 ENCOUNTER — Encounter: Payer: Self-pay | Admitting: Podiatry

## 2019-01-24 NOTE — Progress Notes (Signed)
Subjective: Darin Mcclure presents with h/o diabetic neuropathy and cc of painful, discolored, thick toenails and painful callus/corn which interfere with activities of daily living. Pain is aggravated when wearing enclosed shoe gear. Pain is relieved with periodic professional debridement.  Shon Baton, MD is his PCP.  Patient concerned about his insurance claims and asks that his diabetes code be listed first in order for his insurance to pay his claim. He has been in touch with our billing dept and has [facility] phone number should there be any problems.  He remains on Plavix and gabapentin.  He is s/p Keller/McBride bunionectomy left foot (2019).   Current Outpatient Medications:  .  aspirin EC 81 MG tablet, Take 81 mg by mouth daily., Disp: , Rfl:  .  atorvastatin (LIPITOR) 10 MG tablet, Take 10 mg by mouth daily., Disp: , Rfl: 2 .  cetirizine (ZYRTEC) 10 MG tablet, Take 10 mg by mouth daily as needed. , Disp: , Rfl:  .  clopidogrel (PLAVIX) 75 MG tablet, Take 75 mg by mouth daily., Disp: , Rfl: 2 .  fluticasone (FLONASE) 50 MCG/ACT nasal spray, Place 1 spray into both nostrils daily as needed. , Disp: , Rfl: 3 .  JANUVIA 100 MG tablet, Take 100 mg by mouth daily., Disp: , Rfl: 3 .  losartan (COZAAR) 100 MG tablet, Take 100 mg by mouth daily., Disp: , Rfl: 2 .  Magnesium 500 MG CAPS, Take 1,000 mg by mouth., Disp: , Rfl:  .  metFORMIN (GLUCOPHAGE) 1000 MG tablet, TAKE 1 TABLET BY MOUTH 2 TIMES DAILY WITH MEALS, Disp: , Rfl: 2 .  Methylcellulose, Laxative, (CITRUCEL PO), Take by mouth., Disp: , Rfl:  .  metoprolol succinate (TOPROL-XL) 50 MG 24 hr tablet, Take 100 mg by mouth daily., Disp: , Rfl: 2 .  Multiple Vitamin (MULTIVITAMIN) tablet, Take 1 tablet by mouth daily., Disp: , Rfl:  .  Omega-3 Fatty Acids (OMEGA-3 FISH OIL PO), Take by mouth., Disp: , Rfl:  .  ONE TOUCH ULTRA TEST test strip, USE 1 TO 2 TIMES A DAY, Disp: , Rfl: 5 .  Probiotic Product (PROBIOTIC DAILY PO), Take by  mouth., Disp: , Rfl:   No Known Allergies  Vascular Examination: Capillary refill time immediate x 10 digits.  Dorsalis pedis and Posterior tibial pulses present b/l.  Digital hair present  x 10 digits.  Skin temperature gradient WNL b/l.  Dermatological Examination: Skin with normal turgor, texture and tone b/l.  Toenails 1-5 b/l discolored, thick, dystrophic with subungual debris and pain with palpation to nailbeds due to thickness of nails.  Hyperkeratotic lesion plantar IPJ b/l great toes. No erythema, no edema, no drainage, no flocculence noted.  Musculoskeletal: Muscle strength 5/5 to all LE muscle groups  Hallux hammertoe right great toe.  Neurological: Sensation diminished with 10 gram monofilament b/l.  Vibratory sensation diminished b/l.  Assessment: 1. Painful onychomycosis toenails 1-5 b/l 2. Callus b/l hallux 3. NIDDM with Diabetic neuropathy  Plan: 1. Continue diabetic foot care principles.  2. Toenails 1-5 b/l were debrided in length and girth without iatrogenic bleeding. 3. Hyperkeratotic lesions pared b/l hallux with sterile scalpel blade without incident. 4. Patient to continue soft, supportive shoe gear. 5. Patient to report any pedal injuries to medical professional  6. Follow up 3 months.  7. Patient/POA to call should there be a concern in the interim.

## 2019-02-26 ENCOUNTER — Other Ambulatory Visit: Payer: Self-pay

## 2019-02-26 ENCOUNTER — Ambulatory Visit (INDEPENDENT_AMBULATORY_CARE_PROVIDER_SITE_OTHER): Payer: Federal, State, Local not specified - PPO | Admitting: Gastroenterology

## 2019-02-26 DIAGNOSIS — K219 Gastro-esophageal reflux disease without esophagitis: Secondary | ICD-10-CM

## 2019-02-26 DIAGNOSIS — Z7902 Long term (current) use of antithrombotics/antiplatelets: Secondary | ICD-10-CM | POA: Diagnosis not present

## 2019-02-26 DIAGNOSIS — Z860100 Personal history of colon polyps, unspecified: Secondary | ICD-10-CM

## 2019-02-26 DIAGNOSIS — R1310 Dysphagia, unspecified: Secondary | ICD-10-CM | POA: Diagnosis not present

## 2019-02-26 MED ORDER — PANTOPRAZOLE SODIUM 20 MG PO TBEC: 20.0000 mg | DELAYED_RELEASE_TABLET | Freq: Every day | ORAL | 3 refills | Status: DC | PRN

## 2019-02-26 NOTE — Progress Notes (Signed)
THIS ENCOUNTER IS A VIRTUAL VISIT DUE TO COVID-19 - PATIENT WAS NOT SEEN IN THE OFFICE. PATIENT HAS CONSENTED TO VIRTUAL VISIT / TELEMEDICINE VISIT. PATIENT COULD NOT GET [place] OR HIS [facility] WORKING ON HIS PHONE AND VISIT WAS OVER THE PHONE   Location of patient: home Location of provider: office Name of referring provider:  Persons participating: myself, patient Time spent on call: 30 minutes with patient, additional time spent reviewing old records and coordinating care   HPI :  78 y/o male with a history of CAD on Plavix, GERD, DM, referred by Shon Baton for a new patient visit regarding colonoscopy and dysphagia.  He moved from [place] about 1.5 year ago and now lives in [place], has not seen our office before.Marland Kitchen   He has been having colonoscopy every 3 years due to history of colon polyps. Last exam about about 2015, 1.2 adenoma in the rectum and another 48mm descending adenoma. He reports he had significant blood in the stool about 2 months ago, one time episode, then another milder episode a few weeks later. Nothing since then, he denies any prior history of rectal bleeding. He has not seen blood in the stools since then. He reports episodes were painless. He is not having any constipation or diarrhea. No abdominal pains.   He has a history of GERD, has used protonix in the past. Previously on ranitidine as well. Over time he stopped the PPI due to potential interaction of Plavix as per direction of his other provider. He does have some belching after he eats. He has not had any heartburn / regurgitation for the past year. He has had some dysphagia with dried chicken it can get stuck and cause discomfort until it passes. Other meats and pork can sometimes cause this as well. This is mostly only related to the foods he eats, softer foods do not cause dysphagia. Father had a history of multiple dilations for dysphagia. No FH of esophageal cancer. Father had lung cancer. No colon cancer in  the family. No antacids.   He takes Aleve periodically depending on how active he is, for back pain.   He takes aspirin 81mg  and Plavix. He had a stent placed in CAD in 2007. He follows with cardiology and they have recommended continuing the Plavix. No cardiopulmonary symptoms. No tobacco.   Prior workup: [place] 03/14/15 - hepatomegaly with steatosis, normal GB HIDA 04/12/15 - normal EGD 04/29/15 - mild gastritis, otherwise normal, biopsies negative for HP Colonoscopy 04/08/14 - 73mm descending colon adenoma, 42mm cecal polyp - benign, 1.2cm rectal adenoma   Past Medical History:  Diagnosis Date   Coronary artery disease    cath/stent 2008   Diabetic polyneuropathy associated with diabetes mellitus due to underlying condition ([place]) 08/26/2017   GERD (gastroesophageal reflux disease)    Heart disease    Hyperglycemia    Hypertension    Mixed hyperlipidemia    Muscle cramp 08/26/2017   Neuropathy    Osteoarthritis    RBBB    RBBB (right bundle branch block)    Type 2 diabetes mellitus ([place])      Past Surgical History:  Procedure Laterality Date   ACHILLES TENDON REPAIR Right    BACK SURGERY     CORONARY ANGIOPLASTY     pror stent to mid RCA 10/04/2006   INGUINAL HERNIA REPAIR     PERCUTANEOUS CORONARY STENT INTERVENTION (PCI-S)     REPLACEMENT TOTAL KNEE Left    TOTAL HIP ARTHROPLASTY Right  TOTAL HIP ARTHROPLASTY Left    Family History  Problem Relation Age of Onset   CAD Father    Lung cancer Father    Social History   Tobacco Use   Smoking status: Never Smoker   Smokeless tobacco: Never Used  Substance Use Topics   Alcohol use: Yes    Comment: Rarely   Drug use: No   Current Outpatient Medications  Medication Sig Dispense Refill   aspirin EC 81 MG tablet Take 81 mg by mouth daily.     atorvastatin (LIPITOR) 10 MG tablet Take 10 mg by mouth daily.  2   cetirizine (ZYRTEC) 10 MG tablet Take 10 mg by mouth daily as needed.       clopidogrel (PLAVIX) 75 MG tablet Take 75 mg by mouth daily.  2   fluticasone (FLONASE) 50 MCG/ACT nasal spray Place 1 spray into both nostrils daily as needed.   3   JANUVIA 100 MG tablet Take 100 mg by mouth daily.  3   losartan (COZAAR) 100 MG tablet Take 100 mg by mouth daily.  2   Magnesium 500 MG CAPS Take 1,000 mg by mouth.     metFORMIN (GLUCOPHAGE) 1000 MG tablet TAKE 1 TABLET BY MOUTH 2 TIMES DAILY WITH MEALS  2   Methylcellulose, Laxative, (CITRUCEL PO) Take by mouth.     metoprolol succinate (TOPROL-XL) 50 MG 24 hr tablet Take 100 mg by mouth daily.  2   Multiple Vitamin (MULTIVITAMIN) tablet Take 1 tablet by mouth daily.     Omega-3 Fatty Acids (OMEGA-3 FISH OIL PO) Take by mouth.     ONE TOUCH ULTRA TEST test strip USE 1 TO 2 TIMES A DAY  5   pantoprazole (PROTONIX) 40 MG tablet Take 40 mg by mouth every morning.     Probiotic Product (PROBIOTIC DAILY PO) Take by mouth.     No current facility-administered medications for this visit.    No Known Allergies   Review of Systems: All systems reviewed and negative except where noted in HPI.   Lab Results  Component Value Date   WBC 5.3 12/25/2017   HGB 13.7 12/25/2017   HCT 40.4 12/25/2017   MCV 92 12/25/2017   PLT 161 12/25/2017    Lab Results  Component Value Date   CREATININE 1.27 12/25/2017   BUN 28 (H) 12/25/2017   NA 137 12/25/2017   K 4.8 12/25/2017   CL 99 12/25/2017   CO2 21 12/25/2017    Lab Results  Component Value Date   ALT 29 12/25/2017   AST 23 12/25/2017   ALKPHOS 47 12/25/2017   BILITOT 0.7 12/25/2017     Physical Exam: NA   ASSESSMENT AND PLAN:  78 y/o male here for a new patient visit to discuss the following:  History of colon polyps / antiplatelet therapy - prior colonoscopies reviewed, he is due for a surveillance colonoscopy given his history of polyps. Mostly asymptomatic aside from isolated episode of hematochezia, sounds like this could have been hemorrhoidal but  will be evaluated with colonoscopy. I discussed colonoscopy with him, risks and benefits. He would need to hold Plavix for 5 days prior to the procedure, but could continue aspirin 81mg  during that time. We will reach out to his cardiologist to obtain approval, once he is scheduled. We discussed timing of this exam, due to the COVID-19 outbreak we are not doing any elective endoscopies at this time. We will plan on doing this once our office resumes  normal procedure schedule, this may not be for several weeks. He will be contacted for scheduling in the next 4-6 weeks or so. He agreed.  Dysphagia / history of GERD - history of reflux, not taking anything for this as reflux is fairly well controlled, however having intermittent dysphagia to certain foods as above. Suspect he may have a peptic stricture. I offered an EGD with dilation at same time as colonoscopy, he wanted to proceed. We otherwise discussed his protonix use, and that there is not a significant reaction between that and plavix, and I think okay to use protonix PRN if he needs it. Further, he takes aspirin, plavix, and NSAIDs together which is increasing his risk for GI bleeding. We discussed this. I think he should avoid all NSAIDs if he is taking aspirin / plavix, and use tylenol for aches/ pains. One could also consider using protonix daily regardless of symptoms given aspirin and plavix use. He denies history of GI bleeding, PUD, or H pylori, but he can consider this and we reviewed the data behind it. He agreed with the plan, will await his procedures in a few months.   Sharon Cellar, MD [facility]

## 2019-02-26 NOTE — Progress Notes (Signed)
New pt paperwork 

## 2019-02-27 NOTE — Progress Notes (Signed)
Ok to hold Plavix for 5 days prior colonoscopy. It has been several years since coronary stent placement. Please resume day following procedure if possible.  Candee Furbish, MD

## 2019-03-02 ENCOUNTER — Telehealth: Payer: Self-pay

## 2019-03-02 NOTE — Telephone Encounter (Signed)
Author: Jerline Pain, MD Service: Cardiology Author Type: Physician  Filed: 02/27/2019 5:26 PM Encounter Date: 02/26/2019 Status: Signed  Editor: Jerline Pain, MD (Physician)     Show:Clear all [x] Manual[x] Template[] Copied  Added by: [x] Jerline Pain, MD  [] Hover for details Ok to hold Plavix for 5 days prior colonoscopy. It has been several years since coronary stent placement. Please resume day following procedure if possible.  Candee Furbish, MD     Armbruster, Carlota Raspberry, MD  Roetta Sessions, CMA        Jan can you make a note in this patient's chart - he is cleared to hold plavix for his procedures per cardiology. Once [place] resumes normal business we can schedule him. He should be on the [facility] list for me. Thanks   Previous Messages    ----- Message -----  From: Jerline Pain, MD  Sent: 02/27/2019  5:26 PM EDT  To: Yetta Flock, MD     ----- Message -----  From: Yetta Flock, MD  Sent: 02/26/2019 11:40 AM EDT  To: Jerline Pain, MD

## 2019-04-21 ENCOUNTER — Ambulatory Visit: Payer: Federal, State, Local not specified - PPO | Admitting: Podiatry

## 2019-04-21 ENCOUNTER — Other Ambulatory Visit: Payer: Self-pay

## 2019-04-21 ENCOUNTER — Encounter: Payer: Self-pay | Admitting: Podiatry

## 2019-04-21 VITALS — Temp 97.9°F

## 2019-04-21 DIAGNOSIS — L84 Corns and callosities: Secondary | ICD-10-CM

## 2019-04-21 DIAGNOSIS — M79675 Pain in left toe(s): Secondary | ICD-10-CM | POA: Diagnosis not present

## 2019-04-21 DIAGNOSIS — M79674 Pain in right toe(s): Secondary | ICD-10-CM | POA: Diagnosis not present

## 2019-04-21 DIAGNOSIS — E1142 Type 2 diabetes mellitus with diabetic polyneuropathy: Secondary | ICD-10-CM

## 2019-04-21 DIAGNOSIS — B351 Tinea unguium: Secondary | ICD-10-CM

## 2019-04-21 NOTE — Patient Instructions (Addendum)
Diabetes Mellitus and Foot Care °Foot care is an important part of your health, especially when you have diabetes. Diabetes may cause you to have problems because of poor blood flow (circulation) to your feet and legs, which can cause your skin to: °· Become thinner and drier. °· Break more easily. °· Heal more slowly. °· Peel and crack. °You may also have nerve damage (neuropathy) in your legs and feet, causing decreased feeling in them. This means that you may not notice minor injuries to your feet that could lead to more serious problems. Noticing and addressing any potential problems early is the best way to prevent future foot problems. °How to care for your feet °Foot hygiene °· Wash your feet daily with warm water and mild soap. Do not use hot water. Then, pat your feet and the areas between your toes until they are completely dry. Do not soak your feet as this can dry your skin. °· Trim your toenails straight across. Do not dig under them or around the cuticle. File the edges of your nails with an emery board or nail file. °· Apply a moisturizing lotion or petroleum jelly to the skin on your feet and to dry, brittle toenails. Use lotion that does not contain alcohol and is unscented. Do not apply lotion between your toes. °Shoes and socks °· Wear clean socks or stockings every day. Make sure they are not too tight. Do not wear knee-high stockings since they may decrease blood flow to your legs. °· Wear shoes that fit properly and have enough cushioning. Always look in your shoes before you put them on to be sure there are no objects inside. °· To break in new shoes, wear them for just a few hours a day. This prevents injuries on your feet. °Wounds, scrapes, corns, and calluses °· Check your feet daily for blisters, cuts, bruises, sores, and redness. If you cannot see the bottom of your feet, use a mirror or ask someone for help. °· Do not cut corns or calluses or try to remove them with medicine. °· If you  find a minor scrape, cut, or break in the skin on your feet, keep it and the skin around it clean and dry. You may clean these areas with mild soap and water. Do not clean the area with peroxide, alcohol, or iodine. °· If you have a wound, scrape, corn, or callus on your foot, look at it several times a day to make sure it is healing and not infected. Check for: °? Redness, swelling, or pain. °? Fluid or blood. °? Warmth. °? Pus or a bad smell. °General instructions °· Do not cross your legs. This may decrease blood flow to your feet. °· Do not use heating pads or hot water bottles on your feet. They may burn your skin. If you have lost feeling in your feet or legs, you may not know this is happening until it is too late. °· Protect your feet from hot and cold by wearing shoes, such as at the beach or on hot pavement. °· Schedule a complete foot exam at least once a year (annually) or more often if you have foot problems. If you have foot problems, report any cuts, sores, or bruises to your health care provider immediately. °Contact a health care provider if: °· You have a medical condition that increases your risk of infection and you have any cuts, sores, or bruises on your feet. °· You have an injury that is not   healing. °· You have redness on your legs or feet. °· You feel burning or tingling in your legs or feet. °· You have pain or cramps in your legs and feet. °· Your legs or feet are numb. °· Your feet always feel cold. °· You have pain around a toenail. °Get help right away if: °· You have a wound, scrape, corn, or callus on your foot and: °? You have pain, swelling, or redness that gets worse. °? You have fluid or blood coming from the wound, scrape, corn, or callus. °? Your wound, scrape, corn, or callus feels warm to the touch. °? You have pus or a bad smell coming from the wound, scrape, corn, or callus. °? You have a fever. °? You have a red line going up your leg. °Summary °· Check your feet every day  for cuts, sores, red spots, swelling, and blisters. °· Moisturize feet and legs daily. °· Wear shoes that fit properly and have enough cushioning. °· If you have foot problems, report any cuts, sores, or bruises to your health care provider immediately. °· Schedule a complete foot exam at least once a year (annually) or more often if you have foot problems. °This information is not intended to replace advice given to you by your health care provider. Make sure you discuss any questions you have with your health care provider. °Document Released: 11/09/2000 Document Revised: 12/25/2017 Document Reviewed: 12/14/2016 °Elsevier Interactive Patient Education © 2019 [employer] ° °Diabetic Neuropathy °Diabetic neuropathy refers to nerve damage that is caused by diabetes (diabetes mellitus). Over time, people with diabetes can develop nerve damage throughout the body. There are several types of diabetic neuropathy: °· Peripheral neuropathy. This is the most common type of diabetic neuropathy. It causes damage to nerves that carry signals between the spinal cord and other parts of the body (peripheral nerves). This usually affects nerves in the feet and legs first, and may eventually affect the hands and arms. The damage affects the ability to sense touch or temperature. °· Autonomic neuropathy. This type causes damage to nerves that control involuntary functions (autonomic nerves). These nerves carry signals that control: °? Heartbeat. °? Body temperature. °? Blood pressure. °? Urination. °? Digestion. °? Sweating. °? Sexual function. °? Response to changing blood sugar (glucose) levels. °· Focal neuropathy. This type of nerve damage affects one area of the body, such as an arm, a leg, or the face. The injury may involve one nerve or a small group of nerves. Focal neuropathy can be painful and unpredictable, and occurs most often in older adults with diabetes. This often develops suddenly, but usually improves over time  and does not cause long-term problems. °· Proximal neuropathy. This type of nerve damage affects the nerves of the thighs, hips, buttocks, or legs. It causes severe pain, weakness, and muscle death (atrophy), usually in the thigh muscles. It is more common among older men and people who have type 2 diabetes. The length of recovery time may vary. °What are the causes? °Peripheral, autonomic, and focal neuropathies are caused by diabetes that is not well controlled with treatment. The cause of proximal neuropathy is not known, but it may be caused by inflammation related to uncontrolled blood glucose levels. °What are the signs or symptoms? °Peripheral neuropathy °Peripheral neuropathy develops slowly over time. When the nerves of the feet and legs no longer work, you may experience: °· Burning, stabbing, or aching pain in the legs or feet. °· Pain or cramping in the   legs or feet. °· Loss of feeling (numbness) and inability to feel pressure or pain in the feet. This can lead to: °? Thick calluses or sores on areas of constant pressure. °? Ulcers. °? Reduced ability to feel temperature changes. °· Foot deformities. °· Muscle weakness. °· Loss of balance or coordination. °Autonomic neuropathy °The symptoms of autonomic neuropathy vary depending on which nerves are affected. Symptoms may include: °· Problems with digestion, such as: °? Nausea or vomiting. °? Poor appetite. °? Bloating. °? Diarrhea or constipation. °? Trouble swallowing. °? Losing weight without trying to. °· Problems with the heart, blood and lungs, such as: °? Dizziness, especially when standing up. °? Fainting. °? Shortness of breath. °? Irregular heartbeat. °· Bladder problems, such as: °? Trouble starting or stopping urination. °? Leaking urine. °? Trouble emptying the bladder. °? Urinary tract infections (UTIs). °· Problems with other body functions, such as: °? Sweat. You may sweat too much or too little. °? Temperature. You might get hot easily.  Or, you might feel cold more than usual. °? Sexual function. Men may not be able to get or maintain an erection. Women may have vaginal dryness and difficulty with arousal. °Focal neuropathy °Symptoms affect only one area of the body. Common symptoms include: °· Numbness. °· Tingling. °· Burning pain. °· Prickling feeling. °· Very sensitive skin. °· Weakness. °· Inability to move (paralysis). °· Muscle twitching. °· Muscles getting smaller (wasting). °· Poor coordination. °· Double or blurred vision. °Proximal neuropathy °· Sudden, severe pain in the hip, thigh, or buttocks. Pain may spread from the back into the legs (sciatica). °· Pain and numbness in the arms and legs. °· Tingling. °· Loss of bladder control or bowel control. °· Weakness and wasting of thigh muscles. °· Difficulty getting up from a seated position. °· Abdominal swelling. °· Unexplained weight loss. °How is this diagnosed? °Diagnosis usually involves reviewing your medical history and any symptoms you have. Diagnosis varies depending on the type of neuropathy your health care provider suspects. °Peripheral neuropathy °Your health care provider will check areas that are affected by your nervous system (neurologic exam), such as your reflexes, how you move, and what you can feel. You may have other tests, such as: °· Blood tests. °· Removal and examination of fluid that surrounds the spinal cord (lumbar puncture). °· CT scan. °· MRI. °· A test to check the nerves that control muscles (electromyogram, EMG). °· Tests of how quickly messages pass through your nerves (nerve conduction velocity tests). °· Removal of a small piece of nerve to be examined under a microscope (biopsy). °Autonomic neuropathy °You may have tests, such as: °· Tests to measure your blood pressure and heart rate. This may include monitoring you while you are safely secured to an exam table that moves you from a lying position to an upright position (table tilt test). °· Breathing  tests to check your lungs. °· Tests to check how food moves through the digestive system (gastric emptying tests). °· Blood, sweat, or urine tests. °· Ultrasound of your bladder. °· Spinal fluid tests. °Focal neuropathy °This condition may be diagnosed with: °· A neurologic exam. °· CT scan. °· MRI. °· EMG. °· Nerve conduction velocity tests. °Proximal neuropathy °There is no test to diagnose this type of neuropathy. You may have tests to rule out other possible causes of this type of neuropathy. Tests may include: °· X-rays of your spine and lumbar region. °· Lumbar puncture. °· MRI. °How is this treated? °The   goal of treatment is to keep nerve damage from getting worse. The most important part of treatment is keeping your blood glucose level and your A1C level within your target range by following your diabetes management plan. Over time, maintaining lower blood glucose levels helps lessen symptoms. In some cases, you may need prescription pain medicine. °Follow these instructions at home: ° °Lifestyle ° °· Do not use any products that contain nicotine or tobacco, such as cigarettes and e-cigarettes. If you need help quitting, ask your health care provider. °· Be physically active every day. Include strength training and balance exercises. °· Follow a healthy meal plan. °· Work with your health care provider to manage your blood pressure. °General instructions °· Follow your diabetes management plan as directed. °? Check your blood glucose levels as directed by your health care provider. °? Keep your blood glucose in your target range as directed by your health care provider. °? Have your A1C level checked at least two times a year, or as often as told by your health care provider. °· Take over the counter and prescription medicines only as told by your health care provider. This includes insulin and diabetes medicine. °· Do not drive or use heavy machinery while taking prescription pain medicines. °· Check your  skin and feet every day for cuts, bruises, redness, blisters, or sores. °· Keep all follow up visits as told by your health care provider. This is important. °Contact a health care provider if: °· You have burning, stabbing, or aching pain in your legs or feet. °· You are unable to feel pressure or pain in your feet. °· You develop problems with digestion, such as: °? Nausea. °? Vomiting. °? Bloating. °? Constipation. °? Diarrhea. °? Abdominal pain. °· You have difficulty with urination, such as inability: °? To control when you urinate (incontinence). °? To completely empty the bladder (retention). °· You have palpitations. °· You feel dizzy, weak, or faint when you stand up. °Get help right away if: °· You cannot urinate. °· You have sudden weakness or loss of coordination. °· You have trouble speaking. °· You have pain or pressure in your chest. °· You have an irregular heart beat. °· You have sudden inability to move a part of your body. °Summary °· Diabetic neuropathy refers to nerve damage that is caused by diabetes. It can affect nerves throughout the entire body, causing numbness and pain in the arms, legs, digestive tract, heart, and other body systems. °· Keep your blood glucose level and your blood pressure in your target range, as directed by your health care provider. This can help prevent neuropathy from getting worse. °· Check your skin and feet every day for cuts, bruises, redness, blisters, or sores. °· Do not use any products that contain nicotine or tobacco, such as cigarettes and e-cigarettes. If you need help quitting, ask your health care provider. °This information is not intended to replace advice given to you by your health care provider. Make sure you discuss any questions you have with your health care provider. °Document Released: 01/21/2002 Document Revised: 12/25/2017 Document Reviewed: 12/17/2016 °Elsevier Interactive Patient Education © 2019 [employer] ° °

## 2019-04-26 NOTE — Progress Notes (Signed)
Subjective: Darin Mcclure presents with diabetes, diabetic neuropathy and cc of painful, discolored, thick toenails and calluses b/l hallux which interfere with activities of daily living. Pain is aggravated when wearing enclosed shoe gear. Pain is relieved with periodic professional debridement.  Shon Baton, MD is his PCP and last visit was 6 weeks ago.   Current Outpatient Medications:  .  amoxicillin (AMOXIL) 500 MG capsule, TAKE 4 CAPSULES BY MOUTH 1 HOUR PRIOR TO APPT, Disp: , Rfl:  .  aspirin EC 81 MG tablet, Take 81 mg by mouth daily., Disp: , Rfl:  .  atorvastatin (LIPITOR) 10 MG tablet, Take 10 mg by mouth daily., Disp: , Rfl: 2 .  azelastine (ASTELIN) 0.1 % nasal spray, USE 2 SPRAYS IN EACH NOSTRIL 2 TIMES DAILY, Disp: , Rfl:  .  cetirizine (ZYRTEC) 10 MG tablet, Take 10 mg by mouth daily as needed. , Disp: , Rfl:  .  clopidogrel (PLAVIX) 75 MG tablet, Take 75 mg by mouth daily., Disp: , Rfl: 2 .  fluticasone (FLONASE) 50 MCG/ACT nasal spray, Place 1 spray into both nostrils daily as needed. , Disp: , Rfl: 3 .  JANUVIA 100 MG tablet, Take 100 mg by mouth daily., Disp: , Rfl: 3 .  losartan (COZAAR) 100 MG tablet, Take 100 mg by mouth daily., Disp: , Rfl: 2 .  Magnesium 500 MG CAPS, Take 1,000 mg by mouth., Disp: , Rfl:  .  metFORMIN (GLUCOPHAGE) 1000 MG tablet, TAKE 1 TABLET BY MOUTH 2 TIMES DAILY WITH MEALS, Disp: , Rfl: 2 .  Methylcellulose, Laxative, (CITRUCEL PO), Take by mouth., Disp: , Rfl:  .  metoprolol succinate (TOPROL-XL) 50 MG 24 hr tablet, Take 100 mg by mouth daily., Disp: , Rfl: 2 .  Multiple Vitamin (MULTIVITAMIN) tablet, Take 1 tablet by mouth daily., Disp: , Rfl:  .  Omega-3 Fatty Acids (OMEGA-3 FISH OIL PO), Take by mouth., Disp: , Rfl:  .  ONE TOUCH ULTRA TEST test strip, USE 1 TO 2 TIMES A DAY, Disp: , Rfl: 5 .  pantoprazole (PROTONIX) 20 MG tablet, Take 1 tablet (20 mg total) by mouth daily as needed., Disp: 90 tablet, Rfl: 3 .  Probiotic Product (PROBIOTIC  DAILY PO), Take by mouth., Disp: , Rfl:   No Known Allergies  Objective: Vitals:   04/21/19 0922  Temp: 97.9 F (36.6 C)    Vascular Examination: Capillary refill time immediate x 10 digits.  Dorsalis pedis pulses present b/l  Posterior tibial pulses present b/l.  Digital hair present  x 10 digits.  Skin temperature gradient WNL b/l.  Dermatological Examination: Skin with normal turgor, texture and tone b/l.  Subacute subungual hematoma left 2nd digit. Nailplate attached to nailbed. No erythema, no edema, no drainage, no flocculence.  Toenails 1-5 b/l discolored, thick, dystrophic with subungual debris and pain with palpation to nailbeds due to thickness of nails.  Hyperkeratotic lesion(s) b/l hallux IPJ. No erythema, no edema, no drainage, no flocculence noted.   Musculoskeletal: Muscle strength 5/5 to all LE muscle groups.  Hallux hammertoe right great toe  Neurological: Sensation diminished with 10 gram monofilament.  Assessment: 1. Painful onychomycosis toenails 1-5 b/l 2. Calluses b/l hallux 3. NIDDM with neuropathy  Plan: 1. Continue diabetic foot care principles. Literature dispensed on today. 2. Subungual hematoma left 2nd digit stable. No treatment necessary. 3. Toenails 1-5 b/l were debrided in length and girth without iatrogenic bleeding. 4. Calluses pared b/l hallux utilizing sterile scalpel blade without incident.  5. Patient to continue  soft, supportive shoe gear 6. Patient to report any pedal injuries to medical professional  7. Follow up 3 months.  8. Patient/POA to call should there be a concern in the interim.

## 2019-04-28 ENCOUNTER — Ambulatory Visit (AMBULATORY_SURGERY_CENTER): Payer: Self-pay

## 2019-04-28 ENCOUNTER — Encounter: Payer: Self-pay | Admitting: Gastroenterology

## 2019-04-28 ENCOUNTER — Other Ambulatory Visit: Payer: Self-pay

## 2019-04-28 VITALS — Ht 72.0 in | Wt 250.0 lb

## 2019-04-28 DIAGNOSIS — Z86010 Personal history of colonic polyps: Secondary | ICD-10-CM

## 2019-04-28 DIAGNOSIS — R1310 Dysphagia, unspecified: Secondary | ICD-10-CM

## 2019-04-28 MED ORDER — NA SULFATE-K SULFATE-MG SULF 17.5-3.13-1.6 GM/177ML PO SOLN: 1.0000 | Freq: Once | ORAL | 0 refills | Status: AC

## 2019-04-28 NOTE — Progress Notes (Signed)
Denies allergies to eggs or soy products. Denies complication of anesthesia or sedation. Denies use of weight loss medication. Denies use of O2.   Emmi instructions given for colonoscopy.  Pre-Visit was conducted by phone due to Covid 19. Instructions were reviewed and mailed to patients confirmed home address. Patient was given an opportunity to ask questions. Once patient has had a chance to review instructions patient was encouraged to call if he has any questions or concerns.

## 2019-05-12 ENCOUNTER — Telehealth: Payer: Self-pay | Admitting: Gastroenterology

## 2019-05-12 NOTE — Telephone Encounter (Signed)

## 2019-05-13 ENCOUNTER — Other Ambulatory Visit: Payer: Self-pay

## 2019-05-13 ENCOUNTER — Ambulatory Visit (AMBULATORY_SURGERY_CENTER): Payer: Federal, State, Local not specified - PPO | Admitting: Gastroenterology

## 2019-05-13 ENCOUNTER — Encounter: Payer: Self-pay | Admitting: Gastroenterology

## 2019-05-13 VITALS — BP 153/72 | HR 48 | Temp 97.5°F | Resp 16 | Ht 72.0 in | Wt 252.0 lb

## 2019-05-13 DIAGNOSIS — K210 Gastro-esophageal reflux disease with esophagitis: Secondary | ICD-10-CM

## 2019-05-13 DIAGNOSIS — R1319 Other dysphagia: Secondary | ICD-10-CM

## 2019-05-13 DIAGNOSIS — K222 Esophageal obstruction: Secondary | ICD-10-CM | POA: Diagnosis not present

## 2019-05-13 DIAGNOSIS — R1310 Dysphagia, unspecified: Secondary | ICD-10-CM

## 2019-05-13 DIAGNOSIS — D125 Benign neoplasm of sigmoid colon: Secondary | ICD-10-CM | POA: Diagnosis not present

## 2019-05-13 DIAGNOSIS — K2970 Gastritis, unspecified, without bleeding: Secondary | ICD-10-CM | POA: Diagnosis not present

## 2019-05-13 DIAGNOSIS — D123 Benign neoplasm of transverse colon: Secondary | ICD-10-CM | POA: Diagnosis not present

## 2019-05-13 DIAGNOSIS — K3189 Other diseases of stomach and duodenum: Secondary | ICD-10-CM

## 2019-05-13 DIAGNOSIS — D120 Benign neoplasm of cecum: Secondary | ICD-10-CM | POA: Diagnosis not present

## 2019-05-13 DIAGNOSIS — Z86010 Personal history of colonic polyps: Secondary | ICD-10-CM

## 2019-05-13 MED ORDER — PANTOPRAZOLE SODIUM 40 MG PO TBEC: 40.0000 mg | DELAYED_RELEASE_TABLET | Freq: Every day | ORAL | 3 refills | Status: DC

## 2019-05-13 MED ORDER — SODIUM CHLORIDE 0.9 % IV SOLN: 500.0000 mL | Freq: Once | INTRAVENOUS | Status: DC

## 2019-05-13 NOTE — Op Note (Addendum)
[facility] Patient Name: Darin Mcclure Procedure Date: 05/13/2019 10:33 AM MRN: [id number] Endoscopist: Remo Lipps P. Havery Moros , MD Age: 78 Referring MD:  Date of Birth: 25-Aug-1941 Gender: Male Account #: [id number] Procedure:                Upper GI endoscopy Indications:              Dysphagia, history of GERD, using protonix 20mg  as                            needed Medicines:                Monitored Anesthesia Care Procedure:                Pre-Anesthesia Assessment:                           - Prior to the procedure, a History and Physical                            was performed, and patient medications and                            allergies were reviewed. The patient's tolerance of                            previous anesthesia was also reviewed. The risks                            and benefits of the procedure and the sedation                            options and risks were discussed with the patient.                            All questions were answered, and informed consent                            was obtained. Prior Anticoagulants: The patient has                            taken Plavix (clopidogrel), last dose was 5 days                            prior to procedure. ASA Grade Assessment: III - A                            patient with severe systemic disease. After                            reviewing the risks and benefits, the patient was                            deemed in satisfactory condition to undergo the  procedure.                           After obtaining informed consent, the endoscope was                            passed under direct vision. Throughout the                            procedure, the patient's blood pressure, pulse, and                            oxygen saturations were monitored continuously. The                            Endoscope was introduced through the mouth, and   advanced to the second part of duodenum. The upper                            GI endoscopy was accomplished without difficulty.                            The patient tolerated the procedure well. Scope In: Scope Out: Findings:                 Esophagogastric landmarks were identified: the                            Z-line was found at 44 cm, the gastroesophageal                            junction was found at 44 cm and the upper extent of                            the gastric folds was found at 44 cm from the                            incisors.                           LA Grade B esophagitis was found at the GEJ.                           One benign-appearing, intrinsic mild stenosis was                            found 44 cm from the incisors. This stenosis                            measured less than one cm (in length). A TTS                            dilator was passed through the scope. Dilation with  a 16-17-18 mm balloon dilator was performed to 16                            mm, 17 mm and 18 mm. Biopsies were taken with a                            cold forceps for histology and to open the                            stricture further.                           There was a benign gastric inlet patch in the                            proximal esophagus. The exam of the esophagus was                            otherwise normal.                           Diffuse moderate inflammation characterized by                            adherent blood, erosions, erythema and friability                            was found in the gastric body and in the gastric                            antrum. Biopsies were taken with a cold forceps for                            Helicobacter pylori testing.                           The exam of the stomach was otherwise normal.                           The duodenal bulb and second portion of the                             duodenum were normal. Complications:            No immediate complications. Estimated blood loss:                            Minimal. Estimated Blood Loss:     Estimated blood loss was minimal. Impression:               - Esophagogastric landmarks identified.                           - LA Grade B reflux esophagitis.                           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                            Dilated to 32mm. Biopsied.                           - Benign proximal esophageal gastric inlet patch                           - Gastritis. Biopsied.                           - Normal duodenal bulb and second portion of the                            duodenum. Recommendation:           - Patient has a contact number available for                            emergencies. The signs and symptoms of potential                            delayed complications were discussed with the                            patient. Return to normal activities tomorrow.                            Written discharge instructions were provided to the                            patient.                           - Resume previous diet.                           - Continue present medications.                           - Resume Plavix tomorrow                           - Increase protonix to 40mg  / day, take daily                           - Await pathology results and course following                            dilation Darin Ekholm P. Chevis Weisensel, MD 05/13/2019 11:32:36 AM This report has been signed electronically.

## 2019-05-13 NOTE — Progress Notes (Signed)
A/ox3, pleased with MAC, report to RN 

## 2019-05-13 NOTE — Patient Instructions (Signed)
RESUME YOUR PLAVIX TOMORROW  PICK UP YOUR NEW PRESCRIPTION, PROTONIX 40 MG DAILY, 20-30 MINUTES PRIOR TO MORNING MEAL.   YOU HAD AN ENDOSCOPIC PROCEDURE TODAY AT [facility]:   Refer to the procedure report that was given to you for any specific questions about what was found during the examination.  If the procedure report does not answer your questions, please call your gastroenterologist to clarify.  If you requested that your care partner not be given the details of your procedure findings, then the procedure report has been included in a sealed envelope for you to review at your convenience later.  YOU SHOULD EXPECT: Some feelings of bloating in the abdomen. Passage of more gas than usual.  Walking can help get rid of the air that was put into your GI tract during the procedure and reduce the bloating. If you had a lower endoscopy (such as a colonoscopy or flexible sigmoidoscopy) you may notice spotting of blood in your stool or on the toilet paper. If you underwent a bowel prep for your procedure, you may not have a normal bowel movement for a few days.  Please Note:  You might notice some irritation and congestion in your nose or some drainage.  This is from the oxygen used during your procedure.  There is no need for concern and it should clear up in a day or so.  SYMPTOMS TO REPORT IMMEDIATELY:   Following lower endoscopy (colonoscopy or flexible sigmoidoscopy):  Excessive amounts of blood in the stool  Significant tenderness or worsening of abdominal pains  Swelling of the abdomen that is new, acute  Fever of 100F or higher   Following upper endoscopy (EGD)  Vomiting of blood or coffee ground material  New chest pain or pain under the shoulder blades  Painful or persistently difficult swallowing  New shortness of breath  Fever of 100F or higher  Black, tarry-looking stools  For urgent or emergent issues, a gastroenterologist can be reached at any hour by  calling [phone].   DIET:  We do recommend a small meal at first, but then you may proceed to your regular diet.  Drink plenty of fluids but you should avoid alcoholic beverages for 24 hours.  ACTIVITY:  You should plan to take it easy for the rest of today and you should NOT DRIVE or use heavy machinery until tomorrow (because of the sedation medicines used during the test).    FOLLOW UP: Our staff will call the number listed on your records 48-72 hours following your procedure to check on you and address any questions or concerns that you may have regarding the information given to you following your procedure. If we do not reach you, we will leave a message.  We will attempt to reach you two times.  During this call, we will ask if you have developed any symptoms of COVID 19. If you develop any symptoms (ie: fever, flu-like symptoms, shortness of breath, cough etc.) before then, please call [phone].  If you test positive for Covid 19 in the 2 weeks post procedure, please call and report this information to [place].    If any biopsies were taken you will be contacted by phone or by letter within the next 1-3 weeks.  Please call us at [phone] if you have not heard about the biopsies in 3 weeks.    SIGNATURES/CONFIDENTIALITY: You and/or your care partner have signed paperwork which will be entered into your electronic medical record.  These signatures attest to the fact that that the information above on your After Visit Summary has been reviewed and is understood.  Full responsibility of the confidentiality of this discharge information lies with you and/or your care-partner.

## 2019-05-13 NOTE — Progress Notes (Signed)
Pt's states no medical or surgical changes since previsit or office visit. 

## 2019-05-13 NOTE — Progress Notes (Signed)
Called to room to assist during endoscopic procedure.  Patient ID and intended procedure confirmed with present staff. Received instructions for my participation in the procedure from the performing physician.  

## 2019-05-13 NOTE — Op Note (Signed)
[facility] Patient Name: Darin Mcclure Procedure Date: 05/13/2019 10:33 AM MRN: [id number] Endoscopist: Remo Lipps P. Havery Moros , MD Age: 78 Referring MD:  Date of Birth: [birth year] Gender: Male Account #: [id number] Procedure:                Colonoscopy Indications:              High risk colon cancer surveillance: Personal                            history of colonic polyps Medicines:                Monitored Anesthesia Care Procedure:                Pre-Anesthesia Assessment:                           - Prior to the procedure, a History and Physical                            was performed, and patient medications and                            allergies were reviewed. The patient's tolerance of                            previous anesthesia was also reviewed. The risks                            and benefits of the procedure and the sedation                            options and risks were discussed with the patient.                            All questions were answered, and informed consent                            was obtained. Prior Anticoagulants: The patient has                            taken Plavix (clopidogrel), last dose was 5 days                            prior to procedure. ASA Grade Assessment: III - A                            patient with severe systemic disease. After                            reviewing the risks and benefits, the patient was                            deemed in satisfactory condition to undergo the  procedure.                           After obtaining informed consent, the colonoscope                            was passed under direct vision. Throughout the                            procedure, the patient's blood pressure, pulse, and                            oxygen saturations were monitored continuously. The                            Colonoscope was introduced through the anus and                             advanced to the the cecum, identified by                            appendiceal orifice and ileocecal valve. The                            colonoscopy was performed without difficulty. The                            patient tolerated the procedure well. The quality                            of the bowel preparation was adequate. The                            ileocecal valve, appendiceal orifice, and rectum                            were photographed. Scope In: 10:49:10 AM Scope Out: 11:20:31 AM Scope Withdrawal Time: 0 hours 24 minutes 29 seconds  Total Procedure Duration: 0 hours 31 minutes 21 seconds  Findings:                 The perianal and digital rectal examinations were                            normal.                           A 3 mm polyp was found in the cecum. The polyp was                            sessile. The polyp was removed with a cold snare.                            Resection and retrieval were complete.  Two sessile polyps were found in the transverse                            colon. The polyps were 3 to 4 mm in size. These                            polyps were removed with a cold snare. Resection                            and retrieval were complete.                           A 3 mm polyp was found in the sigmoid colon. The                            polyp was sessile. The polyp was removed with a                            cold snare. Resection and retrieval were complete.                           Multiple medium-mouthed diverticula were found in                            the transverse colon and left colon.                           Internal hemorrhoids were found during retroflexion.                           The left colon did not retain air well, and there                            was looping in the left colon, which prolonged the                            exam. The exam was otherwise without  abnormality. Complications:            No immediate complications. Estimated blood loss:                            Minimal. Estimated Blood Loss:     Estimated blood loss was minimal. Impression:               - One 3 mm polyp in the cecum, removed with a cold                            snare. Resected and retrieved.                           - Two 3 to 4 mm polyps in the transverse colon,  removed with a cold snare. Resected and retrieved.                           - One 3 mm polyp in the sigmoid colon, removed with                            a cold snare. Resected and retrieved.                           - Diverticulosis in the transverse colon and in the                            left colon.                           - Internal hemorrhoids.                           - The examination was otherwise normal. Recommendation:           - Patient has a contact number available for                            emergencies. The signs and symptoms of potential                            delayed complications were discussed with the                            patient. Return to normal activities tomorrow.                            Written discharge instructions were provided to the                            patient.                           - Resume previous diet.                           - Continue present medications.                           - Resume Plavix tomorrow                           - Await pathology results. Remo Lipps P. Serafino Burciaga, MD 05/13/2019 11:26:16 AM This report has been signed electronically.

## 2019-05-15 ENCOUNTER — Telehealth: Payer: Self-pay

## 2019-05-15 NOTE — Telephone Encounter (Signed)
  Follow up Call-  Call back number 05/13/2019  Post procedure Call Back phone  # [phone]  Permission to leave phone message Yes  Some recent data might be hidden     Patient questions:  Do you have a fever, pain , or abdominal swelling? No. Pain Score  0 *  Have you tolerated food without any problems? Yes.    Have you been able to return to your normal activities? Yes.    Do you have any questions about your discharge instructions: Diet   No. Medications  No. Follow up visit  No.  Do you have questions or concerns about your Care? No.  Actions: * If pain score is 4 or above: No action needed, pain <4.  1. Have you developed a fever since your procedure? no  2.   Have you had an respiratory symptoms (SOB or cough) since your procedure? no  3.   Have you tested positive for COVID 19 since your procedure no  4.   Have you had any family members/close contacts diagnosed with the COVID 19 since your procedure?  no   If yes to any of these questions please route to Joylene John, RN and Alphonsa Gin, Therapist, sports.

## 2019-05-21 ENCOUNTER — Encounter: Payer: Self-pay | Admitting: Gastroenterology

## 2019-07-08 ENCOUNTER — Telehealth: Payer: Self-pay

## 2019-07-08 NOTE — Telephone Encounter (Signed)
Prior Auth FOR Pantoprazole 40mg  QD, 90 DAY SUPPLY submitted through CoverMyMeds. APPROVED: Key: [id number] - [place] Case ID: [id number].

## 2019-07-22 ENCOUNTER — Encounter: Payer: Self-pay | Admitting: Podiatry

## 2019-07-22 ENCOUNTER — Other Ambulatory Visit: Payer: Self-pay

## 2019-07-22 ENCOUNTER — Ambulatory Visit: Payer: Federal, State, Local not specified - PPO | Admitting: Podiatry

## 2019-07-22 VITALS — Temp 97.9°F

## 2019-07-22 DIAGNOSIS — B351 Tinea unguium: Secondary | ICD-10-CM

## 2019-07-22 DIAGNOSIS — E1142 Type 2 diabetes mellitus with diabetic polyneuropathy: Secondary | ICD-10-CM

## 2019-07-22 DIAGNOSIS — M79675 Pain in left toe(s): Secondary | ICD-10-CM

## 2019-07-22 DIAGNOSIS — L84 Corns and callosities: Secondary | ICD-10-CM

## 2019-07-22 DIAGNOSIS — M79674 Pain in right toe(s): Secondary | ICD-10-CM

## 2019-07-22 NOTE — Patient Instructions (Signed)
Diabetes Mellitus and Foot Care Foot care is an important part of your health, especially when you have diabetes. Diabetes may cause you to have problems because of poor blood flow (circulation) to your feet and legs, which can cause your skin to:  Become thinner and drier.  Break more easily.  Heal more slowly.  Peel and crack. You may also have nerve damage (neuropathy) in your legs and feet, causing decreased feeling in them. This means that you may not notice minor injuries to your feet that could lead to more serious problems. Noticing and addressing any potential problems early is the best way to prevent future foot problems. How to care for your feet Foot hygiene  Wash your feet daily with warm water and mild soap. Do not use hot water. Then, pat your feet and the areas between your toes until they are completely dry. Do not soak your feet as this can dry your skin.  Trim your toenails straight across. Do not dig under them or around the cuticle. File the edges of your nails with an emery board or nail file.  Apply a moisturizing lotion or petroleum jelly to the skin on your feet and to dry, brittle toenails. Use lotion that does not contain alcohol and is unscented. Do not apply lotion between your toes. Shoes and socks  Wear clean socks or stockings every day. Make sure they are not too tight. Do not wear knee-high stockings since they may decrease blood flow to your legs.  Wear shoes that fit properly and have enough cushioning. Always look in your shoes before you put them on to be sure there are no objects inside.  To break in new shoes, wear them for just a few hours a day. This prevents injuries on your feet. Wounds, scrapes, corns, and calluses  Check your feet daily for blisters, cuts, bruises, sores, and redness. If you cannot see the bottom of your feet, use a mirror or ask someone for help.  Do not cut corns or calluses or try to remove them with medicine.  If you  find a minor scrape, cut, or break in the skin on your feet, keep it and the skin around it clean and dry. You may clean these areas with mild soap and water. Do not clean the area with peroxide, alcohol, or iodine.  If you have a wound, scrape, corn, or callus on your foot, look at it several times a day to make sure it is healing and not infected. Check for: ? Redness, swelling, or pain. ? Fluid or blood. ? Warmth. ? Pus or a bad smell. General instructions  Do not cross your legs. This may decrease blood flow to your feet.  Do not use heating pads or hot water bottles on your feet. They may burn your skin. If you have lost feeling in your feet or legs, you may not know this is happening until it is too late.  Protect your feet from hot and cold by wearing shoes, such as at the beach or on hot pavement.  Schedule a complete foot exam at least once a year (annually) or more often if you have foot problems. If you have foot problems, report any cuts, sores, or bruises to your health care provider immediately. Contact a health care provider if:  You have a medical condition that increases your risk of infection and you have any cuts, sores, or bruises on your feet.  You have an injury that is not   healing.  You have redness on your legs or feet.  You feel burning or tingling in your legs or feet.  You have pain or cramps in your legs and feet.  Your legs or feet are numb.  Your feet always feel cold.  You have pain around a toenail. Get help right away if:  You have a wound, scrape, corn, or callus on your foot and: ? You have pain, swelling, or redness that gets worse. ? You have fluid or blood coming from the wound, scrape, corn, or callus. ? Your wound, scrape, corn, or callus feels warm to the touch. ? You have pus or a bad smell coming from the wound, scrape, corn, or callus. ? You have a fever. ? You have a red line going up your leg. Summary  Check your feet every day  for cuts, sores, red spots, swelling, and blisters.  Moisturize feet and legs daily.  Wear shoes that fit properly and have enough cushioning.  If you have foot problems, report any cuts, sores, or bruises to your health care provider immediately.  Schedule a complete foot exam at least once a year (annually) or more often if you have foot problems. This information is not intended to replace advice given to you by your health care provider. Make sure you discuss any questions you have with your health care provider. Document Released: 11/09/2000 Document Revised: 12/25/2017 Document Reviewed: 12/14/2016 Elsevier Patient Education  2020 [employer]   Onychomycosis/Fungal Toenails  WHAT IS IT? An infection that lies within the keratin of your nail plate that is caused by a fungus.  WHY ME? Fungal infections affect all ages, sexes, races, and creeds.  There may be many factors that predispose you to a fungal infection such as age, coexisting medical conditions such as diabetes, or an autoimmune disease; stress, medications, fatigue, genetics, etc.  Bottom line: fungus thrives in a warm, moist environment and your shoes offer such a location.  IS IT CONTAGIOUS? Theoretically, yes.  You do not want to share shoes, nail clippers or files with someone who has fungal toenails.  Walking around barefoot in the same room or sleeping in the same bed is unlikely to transfer the organism.  It is important to realize, however, that fungus can spread easily from one nail to the next on the same foot.  HOW DO WE TREAT THIS?  There are several ways to treat this condition.  Treatment may depend on many factors such as age, medications, pregnancy, liver and kidney conditions, etc.  It is best to ask your doctor which options are available to you.  1. No treatment.   Unlike many other medical concerns, you can live with this condition.  However for many people this can be a painful condition and may lead to  ingrown toenails or a bacterial infection.  It is recommended that you keep the nails cut short to help reduce the amount of fungal nail. 2. Topical treatment.  These range from herbal remedies to prescription strength nail lacquers.  About 40-50% effective, topicals require twice daily application for approximately 9 to 12 months or until an entirely new nail has grown out.  The most effective topicals are medical grade medications available through physicians offices. 3. Oral antifungal medications.  With an 80-90% cure rate, the most common oral medication requires 3 to 4 months of therapy and stays in your system for a year as the new nail grows out.  Oral antifungal medications do require   blood work to make sure it is a safe drug for you.  A liver function panel will be performed prior to starting the medication and after the first month of treatment.  It is important to have the blood work performed to avoid any harmful side effects.  In general, this medication safe but blood work is required. 4. Laser Therapy.  This treatment is performed by applying a specialized laser to the affected nail plate.  This therapy is noninvasive, fast, and non-painful.  It is not covered by insurance and is therefore, out of pocket.  The results have been very good with a 80-95% cure rate.  The [facility] is the only practice in the area to offer this therapy. 5. Permanent Nail Avulsion.  Removing the entire nail so that a new nail will not grow back. 

## 2019-07-30 NOTE — Progress Notes (Signed)
Subjective:  Darin Mcclure presents to clinic today with cc of  painful, thick, discolored, elongated toenails 1-5 b/l that become tender and cannot cut because of thickness. Pain is aggravated when wearing enclosed shoe gear. Patient also has chronic callosities b/l great toes. He is diabetic. Also on blood thinner, Plavix.   Current Outpatient Medications:  .  amoxicillin (AMOXIL) 500 MG capsule, TAKE 4 CAPSULES BY MOUTH 1 HOUR PRIOR TO APPT, Disp: , Rfl:  .  aspirin EC 81 MG tablet, Take 81 mg by mouth daily., Disp: , Rfl:  .  atorvastatin (LIPITOR) 10 MG tablet, Take 10 mg by mouth daily., Disp: , Rfl: 2 .  azelastine (ASTELIN) 0.1 % nasal spray, USE 2 SPRAYS IN EACH NOSTRIL 2 TIMES DAILY, Disp: , Rfl:  .  cetirizine (ZYRTEC) 10 MG tablet, Take 10 mg by mouth daily as needed. , Disp: , Rfl:  .  clopidogrel (PLAVIX) 75 MG tablet, Take 75 mg by mouth daily., Disp: , Rfl: 2 .  fluticasone (FLONASE) 50 MCG/ACT nasal spray, Place 1 spray into both nostrils daily as needed. , Disp: , Rfl: 3 .  JANUVIA 100 MG tablet, Take 100 mg by mouth daily., Disp: , Rfl: 3 .  levothyroxine (SYNTHROID) 50 MCG tablet, TAKE 1 TABLET BY MOUTH EVERY MORNING ON EMPTY stomach, Disp: , Rfl:  .  losartan (COZAAR) 100 MG tablet, Take 100 mg by mouth daily., Disp: , Rfl: 2 .  Magnesium 500 MG CAPS, Take 1,000 mg by mouth., Disp: , Rfl:  .  metFORMIN (GLUCOPHAGE) 1000 MG tablet, TAKE 1 TABLET BY MOUTH 2 TIMES DAILY WITH MEALS, Disp: , Rfl: 2 .  Methylcellulose, Laxative, (CITRUCEL PO), Take by mouth., Disp: , Rfl:  .  metoprolol succinate (TOPROL-XL) 50 MG 24 hr tablet, Take 100 mg by mouth daily., Disp: , Rfl: 2 .  Multiple Vitamin (MULTIVITAMIN) tablet, Take 1 tablet by mouth daily., Disp: , Rfl:  .  Omega-3 Fatty Acids (OMEGA-3 FISH OIL PO), Take by mouth., Disp: , Rfl:  .  ONE TOUCH ULTRA TEST test strip, USE 1 TO 2 TIMES A DAY, Disp: , Rfl: 5 .  pantoprazole (PROTONIX) 20 MG tablet, Take 20 mg by mouth daily as  needed., Disp: , Rfl:  .  pantoprazole (PROTONIX) 40 MG tablet, Take 1 tablet (40 mg total) by mouth daily., Disp: 90 tablet, Rfl: 3 .  Probiotic Product (PROBIOTIC DAILY PO), Take by mouth., Disp: , Rfl:   Current Facility-Administered Medications:  .  0.9 %  sodium chloride infusion, 500 mL, Intravenous, Once, Armbruster, Carlota Raspberry, MD   No Known Allergies   Objective: Vitals:   07/22/19 0901  Temp: 97.9 F (36.6 C)    Physical Examination:  Vascular Examination: Capillary refill time immediate x 10 digits.  Palpable DP/PT pulses b/l.  Digital hair present b/l.  No edema noted b/l.  Skin temperature gradient WNL b/l.  Dermatological Examination: Skin with normal turgor, texture and tone b/l.  No open wounds b/l.  No interdigital macerations noted b/l.  Elongated, thick, discolored brittle toenails with subungual debris and pain on dorsal palpation of nailbeds 1-5 b/l.  Hyperkeratotic lesion submet head bilateral hallux IPJs with tenderness to palpation. No edema, no erythema, no drainage, no flocculence.  Musculoskeletal Examination: Muscle strength 5/5 to all muscle groups b/l.  Hallux hammertoe right great toe.  No pain, crepitus or joint discomfort with active/passive ROM.  Neurological Examination: Sensation diminished with 10 gram monofilament.  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 Toenails 1-5 b/l were debrided in length and girth without iatrogenic laceration. 2. Calluses pared b/l hallux utilizing sterile scalpel blade without incident. 3. Continue soft, supportive shoe gear daily. 4. Report any pedal injuries to medical professional. 5.   Follow up 3 months. 6.  Patient/POA to call should there be a question/concern in there interim.

## 2019-08-07 ENCOUNTER — Telehealth: Payer: Self-pay | Admitting: *Deleted

## 2019-08-07 NOTE — Telephone Encounter (Signed)
[place].

## 2019-08-07 NOTE — Telephone Encounter (Signed)
Can you please confirm if it was for his neuropathy? If so, send Rx for Neuropathy Cream to [place] for him.  Thanks and have a good weekend!

## 2019-08-10 MED ORDER — NONFORMULARY OR COMPOUNDED ITEM: 11 refills | Status: DC

## 2019-08-10 NOTE — Telephone Encounter (Signed)
I called pt and he states the cream was for the fungal toenails. I told pt I would send to [facility] and gave the [phone] for him to identify and answer for information concerning coverage and delivery. Faxed orders to [facility].

## 2019-10-20 ENCOUNTER — Encounter: Payer: Self-pay | Admitting: Podiatry

## 2019-10-20 ENCOUNTER — Other Ambulatory Visit: Payer: Self-pay

## 2019-10-20 ENCOUNTER — Ambulatory Visit: Payer: Federal, State, Local not specified - PPO | Admitting: Podiatry

## 2019-10-20 DIAGNOSIS — L84 Corns and callosities: Secondary | ICD-10-CM

## 2019-10-20 DIAGNOSIS — E1142 Type 2 diabetes mellitus with diabetic polyneuropathy: Secondary | ICD-10-CM | POA: Diagnosis not present

## 2019-10-20 DIAGNOSIS — B351 Tinea unguium: Secondary | ICD-10-CM

## 2019-10-20 DIAGNOSIS — M79675 Pain in left toe(s): Secondary | ICD-10-CM | POA: Diagnosis not present

## 2019-10-20 DIAGNOSIS — M79674 Pain in right toe(s): Secondary | ICD-10-CM

## 2019-10-20 NOTE — Patient Instructions (Signed)

## 2019-10-24 NOTE — Progress Notes (Signed)
Subjective: Darin Mcclure is seen today for follow up painful, elongated, thickened toenails bilateral feet that he cannot cut. Pain interferes with daily activities. Aggravating factor includes wearing enclosed shoe gear and relieved with periodic debridement. He has been using topical antifungal from [place].   He also would like to discuss his bunion deformity.   Medications reviewed in chart.  No Known Allergies   Objective:  Vascular Examination: Capillary refill time immediate b/l.  Dorsalis pedis present b/l.  Posterior tibial pulses present b/l.  Digital hair present b/l.   Skin temperature gradient WNL b/l.   Dermatological Examination: Skin with normal turgor, texture and tone b/l.  Hyperkeratotic lesion b/l hallux IPJs with tenderness to palpation. No edema, no erythema, no drainage, no flocculence.  Toenails 1-5 b/l discolored, thick, dystrophic with subungual debris and pain with palpation to nailbeds due to thickness of nails.  Musculoskeletal: Muscle strength 5/5 to all LE muscle groups b/l.  Hallux hammertoe right great toe.  No pain, crepitus or joint limitation noted with ROM.   Neurological Examination: Protective sensation diminished b/l.   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 Toenails 1-5 b/l were debrided in length and girth without iatrogenic bleeding. He has noticeable improvement in toenails using topical antifungal from [place]. I would like him to decreased to using medication twice weekly. He related understanding. 2. Calluses pared b/l hallux utilizing sterile scalpel blade without incident. 3. We discussed his hallux hammertoe and conservative measures to address deformity such as shoe gear change. He would like to get it addressed due to his h/o neuropathy and does not want to develop wound due to deformity years from now. He would like to discuss  surgical options for his deformity. Recommended he speak with Dr. Amalia Hailey regarding his deformity and surgical treatment options available.  4. Patient to continue soft, supportive shoe gear daily. 5. Patient to report any pedal injuries to medical professional immediately. 6. Follow up with Dr. Amalia Hailey for consultation/evaluation of hallux hammertoe right foot at his convenience.  7. Follow up with me in 3 months for his routine care.  8. Patient/POA to call should there be a concern in the interim.

## 2019-11-09 ENCOUNTER — Other Ambulatory Visit: Payer: Self-pay | Admitting: Podiatry

## 2019-11-09 ENCOUNTER — Ambulatory Visit (INDEPENDENT_AMBULATORY_CARE_PROVIDER_SITE_OTHER): Payer: Federal, State, Local not specified - PPO

## 2019-11-09 ENCOUNTER — Other Ambulatory Visit: Payer: Self-pay

## 2019-11-09 ENCOUNTER — Ambulatory Visit (INDEPENDENT_AMBULATORY_CARE_PROVIDER_SITE_OTHER): Payer: Federal, State, Local not specified - PPO | Admitting: Podiatry

## 2019-11-09 DIAGNOSIS — M205X1 Other deformities of toe(s) (acquired), right foot: Secondary | ICD-10-CM | POA: Diagnosis not present

## 2019-11-09 DIAGNOSIS — M2042 Other hammer toe(s) (acquired), left foot: Secondary | ICD-10-CM

## 2019-11-09 DIAGNOSIS — M21612 Bunion of left foot: Secondary | ICD-10-CM

## 2019-11-09 DIAGNOSIS — M21611 Bunion of right foot: Secondary | ICD-10-CM | POA: Diagnosis not present

## 2019-11-09 DIAGNOSIS — M205X2 Other deformities of toe(s) (acquired), left foot: Secondary | ICD-10-CM

## 2019-11-09 DIAGNOSIS — M2041 Other hammer toe(s) (acquired), right foot: Secondary | ICD-10-CM

## 2019-11-11 NOTE — Progress Notes (Signed)
   HPI: 78 y.o. male presenting today for follow up evaluation of bilateral hallux limitus and hammertoes of digits 2-5 bilaterally. He reports continued pain from the hammertoes. He reports difficulty in moving the great toes. He has been wearing New Balance shoes for treatment which seem to help alleviate his symptoms. Being on his feet for long periods of time makes the pain worse. Patient is here for further evaluation and treatment.   Past Medical History:  Diagnosis Date  . Allergy   . Blood transfusion without reported diagnosis   . Coronary artery disease    cath/stent 2008  . Diabetic polyneuropathy associated with diabetes mellitus due to underlying condition ([place]) 08/26/2017  . GERD (gastroesophageal reflux disease)   . Heart disease   . Hyperglycemia   . Hypertension   . Mixed hyperlipidemia   . Muscle cramp 08/26/2017  . Neuropathy   . Osteoarthritis   . RBBB   . RBBB (right bundle branch block)   . Type 2 diabetes mellitus ([facility])       Objective: Physical Exam General: The patient is alert and oriented x3 in no acute distress.  Dermatology: Skin is cool, dry and supple bilateral lower extremities. Negative for open lesions or macerations.  Vascular: Palpable pedal pulses bilaterally. No edema or erythema noted. Capillary refill within normal limits.  Neurological: Epicritic and protective threshold grossly intact bilaterally.   Musculoskeletal Exam: Limited range of motion noted to the first MPJ bilateral feet. Muscle strength 5/5 in all groups bilateral. Hammertoe contracture deformity noted to digits 2-5 of the bilateral feet.  Radiographic Exam: Hammertoe contracture deformity noted to the interphalangeal joints and MPJ of the respective hammertoe digits mentioned on clinical musculoskeletal exam. Degenerative changes noted with joint space narrowing first MPJ. There also appears to be extra-articular spurring noted about the joint.   Assessment: 1. Hallux  limitus bilateral - asymptomatic  2. 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 Patient evaluated. X-Rays reviewed.  2. Recommended conservative treatment at this time.  3. Continue wearing New Balance shoes from the [employer].  4. Return to clinic in 3 months for routine care.     Edrick Kins, DPM [facility]  Dr. Edrick Kins, DPM    [address], [place] [address]                Office [phone]  Fax [phone]

## 2019-11-18 DIAGNOSIS — Z96652 Presence of left artificial knee joint: Secondary | ICD-10-CM | POA: Insufficient documentation

## 2019-12-18 ENCOUNTER — Encounter: Payer: Self-pay | Admitting: Cardiology

## 2019-12-18 ENCOUNTER — Other Ambulatory Visit: Payer: Self-pay

## 2019-12-18 ENCOUNTER — Ambulatory Visit: Payer: Federal, State, Local not specified - PPO | Admitting: Cardiology

## 2019-12-18 VITALS — BP 130/72 | HR 61 | Ht 72.0 in | Wt 252.0 lb

## 2019-12-18 DIAGNOSIS — I2510 Atherosclerotic heart disease of native coronary artery without angina pectoris: Secondary | ICD-10-CM

## 2019-12-18 DIAGNOSIS — E785 Hyperlipidemia, unspecified: Secondary | ICD-10-CM

## 2019-12-18 DIAGNOSIS — I10 Essential (primary) hypertension: Secondary | ICD-10-CM

## 2019-12-18 NOTE — Progress Notes (Signed)
Cardiology Office Note:    Date:  12/18/2019   ID:  Darin Mcclure, DOB [birth year], MRN [id number]  PCP:  Shon Baton, MD  Cardiologist:  Candee Furbish, MD  Electrophysiologist:  None   Referring MD: Shon Baton, MD     History of Present Illness:    Darin Mcclure is a 79 y.o. male here for follow-up of coronary artery disease, prior Cypher stent placed on 10/04/2006 and mid right coronary artery.  Ostium of left main was normal by intravascular ultrasound.  Cardiac catheterization in 2009 showed normal right coronary artery.  EF normal.  Moderate disease involving LAD and diagonal branches.  Carotid duplex showed 39% stenosis bilaterally.  Stable calcified plaque was noted in the left carotid bulb.  Previously had worked for [employer] for 50 years.  At one point had some unusual left eye blurriness and headache for about a week.  Left neck felt tender.  Eye physician.  12/18/2019-discussed the rationale of stress testing in the setting of symptoms.  Asked why he needs to be on Synthroid and what goal TSH was, told him to discuss with Dr. Virgina Jock.  He stated that in his own reading he has different ideas.  Once again asked him to discuss with Dr. Virgina Jock. Total knee importance of taking Plavix and aspirin with his Cypher stent. Said that his prior cardiologist did a stress test and echocardiogram every 3 years.  Explained to him the rationale of doing testing based upon symptoms.  He then was frustrated about Covid vaccine availability.  Past Medical History:  Diagnosis Date  . Allergy   . Blood transfusion without reported diagnosis   . Coronary artery disease    cath/stent 2008  . Diabetic polyneuropathy associated with diabetes mellitus due to underlying condition ([place]) 08/26/2017  . GERD (gastroesophageal reflux disease)   . Heart disease   . Hyperglycemia   . Hypertension   . Mixed hyperlipidemia   . Muscle cramp 08/26/2017  . Neuropathy   . Osteoarthritis   . RBBB   .  RBBB (right bundle branch block)   . Type 2 diabetes mellitus ([place])     Past Surgical History:  Procedure Laterality Date  . ACHILLES TENDON REPAIR Right   . BACK SURGERY    . CORONARY ANGIOPLASTY     pror stent to mid RCA 10/04/2006  . FOOT SURGERY     Cyst removal  . INGUINAL HERNIA REPAIR    . PERCUTANEOUS CORONARY STENT INTERVENTION (PCI-S)    . REPLACEMENT TOTAL KNEE Left   . TOTAL HIP ARTHROPLASTY Right   . TOTAL HIP ARTHROPLASTY Left     Current Medications: Current Meds  Medication Sig  . amoxicillin (AMOXIL) 500 MG capsule TAKE 4 CAPSULES BY MOUTH 1 HOUR PRIOR TO APPT  . aspirin EC 81 MG tablet Take 81 mg by mouth daily.  Marland Kitchen atorvastatin (LIPITOR) 10 MG tablet Take 10 mg by mouth daily.  Marland Kitchen azelastine (ASTELIN) 0.1 % nasal spray USE 2 SPRAYS IN EACH NOSTRIL 2 TIMES DAILY  . cetirizine (ZYRTEC) 10 MG tablet Take 10 mg by mouth daily as needed.   . clopidogrel (PLAVIX) 75 MG tablet Take 75 mg by mouth daily.  . fluticasone (FLONASE) 50 MCG/ACT nasal spray Place 1 spray into both nostrils daily as needed.   Marland Kitchen JANUVIA 100 MG tablet Take 100 mg by mouth daily.  Marland Kitchen levothyroxine (SYNTHROID) 50 MCG tablet TAKE 1 TABLET BY MOUTH EVERY MORNING ON EMPTY stomach  . losartan (COZAAR) 100  MG tablet Take 100 mg by mouth daily.  . Magnesium 500 MG CAPS Take 1,000 mg by mouth.  . metFORMIN (GLUCOPHAGE) 1000 MG tablet TAKE 1 TABLET BY MOUTH 2 TIMES DAILY WITH MEALS  . Methylcellulose, Laxative, (CITRUCEL PO) Take by mouth.  . metoprolol succinate (TOPROL-XL) 50 MG 24 hr tablet Take 100 mg by mouth daily.  . Multiple Vitamin (MULTIVITAMIN) tablet Take 1 tablet by mouth daily.  . NONFORMULARY OR COMPOUNDED ITEM [facility]: Antifungal topical - Terbinafine 3%, Fluconazole 2%, Tea Tree Oil 5%, Urea 10%, Ibuprofen 2%, in #32ml DMSO suspension. Apply to affected toenail(s) once daily at bedtime or twice daily.  . Omega-3 Fatty Acids (OMEGA-3 FISH OIL PO) Take by mouth.  . ONE TOUCH  ULTRA TEST test strip USE 1 TO 2 TIMES A DAY  . pantoprazole (PROTONIX) 40 MG tablet Take 1 tablet (40 mg total) by mouth daily.  . Probiotic Product (PROBIOTIC DAILY PO) Take by mouth.   Current Facility-Administered Medications for the 12/18/19 encounter (Office Visit) with Jerline Pain, MD  Medication  . 0.9 %  sodium chloride infusion     Allergies:   Patient has no known allergies.   Social History   Socioeconomic History  . Marital status: Single    Spouse name: Not on file  . Number of children: 2  . Years of education: Not on file  . Highest education level: Not on file  Occupational History  . Not on file  Tobacco Use  . Smoking status: Never Smoker  . Smokeless tobacco: Never Used  Substance and Sexual Activity  . Alcohol use: Yes    Comment: Rarely  . Drug use: No  . Sexual activity: Not on file  Other Topics Concern  . 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    Social Determinants of Health   Financial Resource Strain:   . Difficulty of Paying Living Expenses: Not on file  Food Insecurity:   . Worried About Charity fundraiser in the Last Year: Not on file  . Ran Out of Food in the Last Year: Not on file  Transportation Needs:   . Lack of Transportation (Medical): Not on file  . Lack of Transportation (Non-Medical): Not on file  Physical Activity:   . Days of Exercise per Week: Not on file  . Minutes of Exercise per Session: Not on file  Stress:   . Feeling of Stress : Not on file  Social Connections:   . Frequency of Communication with Friends and Family: Not on file  . Frequency of Social Gatherings with Friends and Family: Not on file  . Attends Religious Services: Not on file  . Active Member of Clubs or Organizations: Not on file  . Attends Archivist Meetings: Not on file  . Marital  Status: Not on file     Family History: The patient's family history includes CAD in his father; Lung cancer in his father. There is no history of Colon cancer, Esophageal cancer, Rectal cancer, or Stomach cancer.  ROS:   Please see the history of present illness.     All other systems reviewed and are negative.  EKGs/Labs/Other Studies Reviewed:    The following studies were reviewed today: As above  EKG:  EKG is ordered today.  Sinus rhythm  right bundle branch block no other abnormalities.  No change from prior.  Recent Labs: No results found for requested labs within last 8760 hours.  Recent Lipid Panel    Component Value Date/Time   CHOL 100 12/25/2017 0845   TRIG 166 (H) 12/25/2017 0845   HDL 36 (L) 12/25/2017 0845   CHOLHDL 2.8 12/25/2017 0845   LDLCALC 31 12/25/2017 0845    Physical Exam:    VS:  BP 130/72   Pulse 61   Ht 6' (1.829 m)   Wt 252 lb (114.3 kg)   SpO2 97%   BMI 34.18 kg/m     Wt Readings from Last 3 Encounters:  12/18/19 252 lb (114.3 kg)  05/13/19 252 lb (114.3 kg)  04/28/19 250 lb (113.4 kg)     GEN:  Well nourished, well developed in no acute distress, obese HEENT: Normal NECK: No JVD; No carotid bruits LYMPHATICS: No lymphadenopathy CARDIAC: RRR, no murmurs, rubs, gallops RESPIRATORY:  Clear to auscultation without rales, wheezing or rhonchi  ABDOMEN: Soft, non-tender, non-distended MUSCULOSKELETAL:  No edema; No deformity  SKIN: Warm and dry NEUROLOGIC:  Alert and oriented x 3 PSYCHIATRIC:  Normal affect   ASSESSMENT:    1. Coronary artery disease involving native coronary artery of native heart without angina pectoris   2. Essential hypertension   3. Hyperlipidemia, unspecified hyperlipidemia type    PLAN:    In order of problems listed above:  Coronary artery disease -Cypher stent right coronary artery 2007 -Repeat catheterization stable.  Feels well.  No anginal symptoms.  -mild SOB with flight of stairs.  Continue to  watch the symptoms, if this progresses, stress test.  Essential hypertension -Medications reviewed as above no changes made.  Continue  Diabetes with hypertension/hyperlipidemia -Continue with atorvastatin 10 mg last LDL from outside office was 31 triglycerides 227.  Hemoglobin A1c was 6.7.  Creatinine 1.28.  Right bundle branch block -Stable.  No syncope.  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  Concerns regarding medicines are outlined above.  Orders Placed This Encounter  Procedures  . Ambulatory referral to Nutrition and Diabetic Education  . EKG 12-Lead   No orders of the defined types were placed in this encounter.   Patient Instructions  Medication Instructions:  The current medical regimen is effective;  continue present plan and medications.  *If you need a refill on your cardiac medications before your next appointment, please call your pharmacy*  You have been referred to Nutrition and Diabetic Education.  You will be contacted to be scheduled for an appointment.  Follow-Up: At [facility], you and your health needs are our priority.  As part of our continuing mission to provide you with exceptional heart care, we have created designated Provider Care Teams.  These Care Teams include your primary Cardiologist (physician) and Advanced Practice Providers (APPs -  Physician Assistants and Nurse Practitioners) who all work together to provide you with the care you need, when you need it.  Your next appointment:   12 month(s)  The format for your next appointment:   In Person  Provider:   Candee Furbish, MD  Thank you for choosing [facility]!!        Signed, Candee Furbish, MD  12/18/2019 10:40 AM    [place]

## 2019-12-18 NOTE — Patient Instructions (Addendum)
Medication Instructions:  The current medical regimen is effective;  continue present plan and medications.  *If you need a refill on your cardiac medications before your next appointment, please call your pharmacy*  You have been referred to Nutrition and Diabetic Education.  You will be contacted to be scheduled for an appointment.  Follow-Up: At [facility], you and your health needs are our priority.  As part of our continuing mission to provide you with exceptional heart care, we have created designated Provider Care Teams.  These Care Teams include your primary Cardiologist (physician) and Advanced Practice Providers (APPs -  Physician Assistants and Nurse Practitioners) who all work together to provide you with the care you need, when you need it.  Your next appointment:   12 month(s)  The format for your next appointment:   In Person  Provider:   Candee Furbish, MD  Thank you for choosing [facility]!!

## 2020-01-19 ENCOUNTER — Ambulatory Visit: Payer: Federal, State, Local not specified - PPO | Admitting: Podiatry

## 2020-01-19 ENCOUNTER — Other Ambulatory Visit: Payer: Self-pay

## 2020-01-19 ENCOUNTER — Encounter: Payer: Self-pay | Admitting: Podiatry

## 2020-01-19 DIAGNOSIS — E1142 Type 2 diabetes mellitus with diabetic polyneuropathy: Secondary | ICD-10-CM | POA: Diagnosis not present

## 2020-01-19 DIAGNOSIS — L84 Corns and callosities: Secondary | ICD-10-CM

## 2020-01-19 DIAGNOSIS — B351 Tinea unguium: Secondary | ICD-10-CM

## 2020-01-19 NOTE — Patient Instructions (Signed)

## 2020-01-19 NOTE — Progress Notes (Signed)
Subjective: Darin Mcclure presents today for follow up of preventative diabetic foot care and follow up mycotic toenails b/l feet.  He is using compounded topical antifungal from [place] and is pleased with his progress. He feels the right great toenail is much better.  Darin Mcclure voices no new pedal concerns on today's visit.   No Known Allergies   Objective: There were no vitals filed for this visit.  Vascular Examination:  Capillary refill time to digits immediate b/l, palpable DP pulses b/l, palpable PT pulses b/l, pedal hair present b/l and skin temperature gradient within normal limits b/l  Dermatological Examination: Pedal skin with normal turgor, texture and tone bilaterally, no open wounds bilaterally, no interdigital macerations bilaterally, toenails 1-3 left, b/l 5th digits 2, 3 right are elongated, thick, discolored, with subungual debris and hyperkeratotic lesion(s) right hallux and distal tip left 2nd digit.  No erythema, no edema, no drainage, no flocculence.   There is resolution of onychomycosis of right great toe and b/l 4th digits  Musculoskeletal: Normal muscle strength 5/5 to all lower extremity muscle groups bilaterally, no pain crepitus or joint limitation noted with ROM b/l, hammertoes noted to the  2-5 bilaterally and limited joint ROM to the b/l 1st MPJ's  Neurological: Protective sensation intact 5/5 intact bilaterally with 10g monofilament b/l, vibratory sensation intact b/l, proprioception intact bilaterally and he does have some numbness to the distal tip of his toes b/l.  Assessment: 1. Diabetic peripheral neuropathy associated with type 2 diabetes mellitus ([place])   2. Onychomycosis   3. Callus    Plan: -Continue diabetic foot care principles. Literature dispensed on today.  -Toenails 1-3 left, b/l 5th digits 2, 3 right were debrided in length and girth with sterile nail nippers and dremel without iatrogenic bleeding. Continue compounded  topical antifungal from [place] to these nails once daily. -Calluses were debrided without complication or incident. Total number debrided right hallux and distal tip left 2nd digit. -Patient to continue soft, supportive shoe gear daily. -Patient to report any pedal injuries to medical professional immediately. -Patient/POA to call should there be question/concern in the interim.  Return in about 3 months (around 04/17/2020) for diabetic nail and callus trim.

## 2020-02-01 ENCOUNTER — Ambulatory Visit: Payer: Federal, State, Local not specified - PPO | Admitting: Neurology

## 2020-03-11 ENCOUNTER — Other Ambulatory Visit: Payer: Self-pay

## 2020-03-11 ENCOUNTER — Encounter: Payer: Self-pay | Admitting: Neurology

## 2020-03-11 ENCOUNTER — Ambulatory Visit: Payer: Federal, State, Local not specified - PPO | Admitting: Neurology

## 2020-03-11 VITALS — BP 175/84 | HR 58 | Resp 20 | Ht 72.0 in | Wt 253.0 lb

## 2020-03-11 DIAGNOSIS — E1142 Type 2 diabetes mellitus with diabetic polyneuropathy: Secondary | ICD-10-CM | POA: Diagnosis not present

## 2020-03-11 DIAGNOSIS — G3184 Mild cognitive impairment, so stated: Secondary | ICD-10-CM

## 2020-03-11 NOTE — Progress Notes (Signed)
Follow-up Visit   Date: 03/11/20    Faruk Mcneil MRN: [id number] DOB: Mar 16, 1941   Interim History: Darin Mcclure is a 79 y.o. right-handed Caucasian male with hypertensin, hyperlipidemia, diabetes mellitus, CAD s/p PCI with stent, and GERD returning to the clinic for follow-up of neuropathy.    History of present illness: He started having numbness of toes, which had progressed over the years to involve his foot and lower leg.  He was diagnosed with neuropathy and seen at [facility] and many years later, was diagnosed with diabetes mellitus.  He complains that his left foot feels heavy, especially when he wakes up.  He has very brief spells of pulsating sharp pain, lasting 30-min to several hours which can affect both legs.  He saw Dr. Yehuda Sav[place], [facility] Bonito. while living in [place] and moved here in July 2018.  He had NCS/EMG which confirmed neuropathy.    He also complained of being forgetful at times, such as walking into the room and forgetting the reason for being there.  He is able to maintain all instrumental ADLs such as driving, managing finances, and household chores.  UPDATE 03/11/2020:  He is here for follow-up of neuropathy.  His neuropathy continues to be bothersome in the feet, with constant numbness and intermittent burning pain at night time.  He has spells of severe sharp pain in the feet, which lasts about 2-days.  He has noticed that it occurs when he eats shrimp.  He also complains of achy pain in the feet.  Overall, symptoms are stable and he does not feel numbness extending into the lower legs.  His balance is fair.  He continues to report some forgetfulness, such as walking into a room and forgetting what he needed, but remains highly active and independent with all IADLs and ADLs.  He has many questions regarding cognitive health and neuropathy.   Medications:  Current Outpatient Medications on File Prior to Visit  Medication Sig Dispense Refill  . aspirin EC  81 MG tablet Take 81 mg by mouth daily.    Marland Kitchen atorvastatin (LIPITOR) 10 MG tablet Take 10 mg by mouth daily.  2  . azelastine (ASTELIN) 0.1 % nasal spray USE 2 SPRAYS IN EACH NOSTRIL 2 TIMES DAILY    . cetirizine (ZYRTEC) 10 MG tablet Take 10 mg by mouth daily as needed.     . clopidogrel (PLAVIX) 75 MG tablet Take 75 mg by mouth daily.  2  . fluticasone (FLONASE) 50 MCG/ACT nasal spray Place 1 spray into both nostrils daily as needed.   3  . JANUVIA 100 MG tablet Take 100 mg by mouth daily.  3  . levothyroxine (SYNTHROID) 50 MCG tablet TAKE 1 TABLET BY MOUTH EVERY MORNING ON EMPTY stomach    . losartan (COZAAR) 100 MG tablet Take 100 mg by mouth daily.  2  . Magnesium 500 MG CAPS Take 1,000 mg by mouth.    . metFORMIN (GLUCOPHAGE) 1000 MG tablet TAKE 1 TABLET BY MOUTH 2 TIMES DAILY WITH MEALS  2  . Methylcellulose, Laxative, (CITRUCEL PO) Take by mouth.    . metoprolol succinate (TOPROL-XL) 50 MG 24 hr tablet Take 100 mg by mouth daily.  2  . Multiple Vitamin (MULTIVITAMIN) tablet Take 1 tablet by mouth daily.    . NONFORMULARY OR COMPOUNDED ITEM [facility]: Antifungal topical - Terbinafine 3%, Fluconazole 2%, Tea Tree Oil 5%, Urea 10%, Ibuprofen 2%, in #63ml DMSO suspension. Apply to affected toenail(s) once daily at bedtime  or twice daily. 30 each 11  . Omega-3 Fatty Acids (OMEGA-3 FISH OIL PO) Take by mouth.    . ONE TOUCH ULTRA TEST test strip USE 1 TO 2 TIMES A DAY  5  . pantoprazole (PROTONIX) 40 MG tablet Take 1 tablet (40 mg total) by mouth daily. 90 tablet 3  . Probiotic Product (PROBIOTIC DAILY PO) Take by mouth.    Marland Kitchen amoxicillin (AMOXIL) 500 MG capsule TAKE 4 CAPSULES BY MOUTH 1 HOUR PRIOR TO APPT     Current Facility-Administered Medications on File Prior to Visit  Medication Dose Route Frequency Provider Last Rate Last Admin  . 0.9 %  sodium chloride infusion  500 mL Intravenous Once Armbruster, Carlota Raspberry, MD        Allergies: No Known Allergies  Vital Signs:  BP (!)  175/84   Pulse (!) 58   Resp 20   Ht 6' (1.829 m)   Wt 253 lb (114.8 kg)   SpO2 97%   BMI 34.31 kg/m   Neurological Exam: MENTAL STATUS including orientation to time, place, person, recent and remote memory, attention span and concentration, language, and fund of knowledge is normal.  Speech is not dysarthric.  CRANIAL NERVES:    Face is symmetric.  MOTOR:  Motor strength is 5/5 in all extremities, including distally. No pronator drift.  Tone is normal.    SENSORY:  Absent vibration at the ankles; diminished pin prick and temperature distal to ankles bilaterally.  Rhomberg sign is very mildly positive.  COORDINATION/GAIT:   Gait narrow based and stable, mildly wide-based. Tandem and stressed gait intact.  Unassisted  Data: Labs 08/03/2016:  Folate >24, TSH 2.37, Lyme neg, RF neg, CCP neg, HbA1c 6.3, ANA neg, vitamin B12 309 Labs 08/26/2017:  Vitamin B12 298, copper 78, MMA 165, SPEP with IFE no M protein Lab Results  Component Value Date   VITAMINB12 391 12/17/2018    TCD 08/03/2016:  Normal EEG 08/03/2016:  Normal  Cognitive testing 08/03/2016:  Normal NCS/EMG of the legs 08/03/2016:  Diffuse sensorimotor polyneuropathy US carotids 12/15/2018:  1-39% bilateral ICA stenosis  Lab Results  Component Value Date   HGBA1C 6.9 (H) 12/17/2018     IMPRESSION/PLAN: 1.  Diabetic neuropathy manifesting numbness in the feet and lower legs, mild sensory ataxia.  Exam remains stable for the past 2 years that he has been seeing me.  Diabetes is very well-controlled with A1c 6.9  - He had many questions about the natural course of neuropathy, advancements, and prognosis which were addressed  - Patient educated on daily foot inspection, fall prevention, and safety precautions around the home.  2.  Mild cognitive impairment, subjective.  He remains highly independent and does not have signs of dementia.    - Encouraged to continue to stay active, engage in mentally stimulating activities  Total  time spent:  30 min  Thank you for allowing me to participate in patient's care.  If I can answer any additional questions, I would be pleased to do so.    Sincerely,    Antionio Negron K. Posey Pronto, DO

## 2020-03-11 NOTE — Patient Instructions (Signed)
Return to clinic in 1 year.

## 2020-04-19 ENCOUNTER — Other Ambulatory Visit: Payer: Self-pay

## 2020-04-19 ENCOUNTER — Encounter: Payer: Self-pay | Admitting: Podiatry

## 2020-04-19 ENCOUNTER — Ambulatory Visit: Payer: Federal, State, Local not specified - PPO | Admitting: Podiatry

## 2020-04-19 DIAGNOSIS — M79675 Pain in left toe(s): Secondary | ICD-10-CM

## 2020-04-19 DIAGNOSIS — L84 Corns and callosities: Secondary | ICD-10-CM | POA: Diagnosis not present

## 2020-04-19 DIAGNOSIS — M79674 Pain in right toe(s): Secondary | ICD-10-CM | POA: Diagnosis not present

## 2020-04-19 DIAGNOSIS — E1142 Type 2 diabetes mellitus with diabetic polyneuropathy: Secondary | ICD-10-CM | POA: Diagnosis not present

## 2020-04-19 DIAGNOSIS — B351 Tinea unguium: Secondary | ICD-10-CM | POA: Diagnosis not present

## 2020-04-19 NOTE — Progress Notes (Addendum)
Subjective: Darin Mcclure presents today preventative diabetic foot care and corn(s) left 2nd toe and callus(es) right hallux and painful mycotic nails b/l.  Pain interferes with ambulation. Aggravating factors include wearing enclosed shoe gear.  Darin Baton, MD is patient's PCP.   Past Medical History:  Diagnosis Date  . Allergy   . Blood transfusion without reported diagnosis   . Coronary artery disease    cath/stent 2008  . Diabetic polyneuropathy associated with diabetes mellitus due to underlying condition ([place]) 08/26/2017  . GERD (gastroesophageal reflux disease)   . Heart disease   . Hyperglycemia   . Hypertension   . Mixed hyperlipidemia   . Muscle cramp 08/26/2017  . Neuropathy   . Osteoarthritis   . RBBB   . RBBB (right bundle branch block)   . Type 2 diabetes mellitus ([place])      Current Outpatient Medications on File Prior to Visit  Medication Sig Dispense Refill  . amoxicillin (AMOXIL) 500 MG capsule TAKE 4 CAPSULES BY MOUTH 1 HOUR PRIOR TO APPT    . aspirin EC 81 MG tablet Take 81 mg by mouth daily.    Marland Kitchen atorvastatin (LIPITOR) 10 MG tablet Take 10 mg by mouth daily.  2  . azelastine (ASTELIN) 0.1 % nasal spray USE 2 SPRAYS IN EACH NOSTRIL 2 TIMES DAILY    . cetirizine (ZYRTEC) 10 MG tablet Take 10 mg by mouth daily as needed.     . clopidogrel (PLAVIX) 75 MG tablet Take 75 mg by mouth daily.  2  . fluticasone (FLONASE) 50 MCG/ACT nasal spray Place 1 spray into both nostrils daily as needed.   3  . JANUVIA 100 MG tablet Take 100 mg by mouth daily.  3  . levothyroxine (SYNTHROID) 50 MCG tablet TAKE 1 TABLET BY MOUTH EVERY MORNING ON EMPTY stomach    . levothyroxine (SYNTHROID) 75 MCG tablet Take 75 mcg by mouth every other day.    . losartan (COZAAR) 100 MG tablet Take 100 mg by mouth daily.  2  . Magnesium 500 MG CAPS Take 1,000 mg by mouth.    . metFORMIN (GLUCOPHAGE) 1000 MG tablet TAKE 1 TABLET BY MOUTH 2 TIMES DAILY WITH MEALS  2  . Methylcellulose, Laxative,  (CITRUCEL PO) Take by mouth.    . metoprolol succinate (TOPROL-XL) 50 MG 24 hr tablet Take 100 mg by mouth daily.  2  . Multiple Vitamin (MULTIVITAMIN) tablet Take 1 tablet by mouth daily.    . NONFORMULARY OR COMPOUNDED ITEM [facility]: Antifungal topical - Terbinafine 3%, Fluconazole 2%, Tea Tree Oil 5%, Urea 10%, Ibuprofen 2%, in #18ml DMSO suspension. Apply to affected toenail(s) once daily at bedtime or twice daily. 30 each 11  . Omega-3 Fatty Acids (OMEGA-3 FISH OIL PO) Take by mouth.    . ONE TOUCH ULTRA TEST test strip USE 1 TO 2 TIMES A DAY  5  . pantoprazole (PROTONIX) 40 MG tablet Take 1 tablet (40 mg total) by mouth daily. 90 tablet 3  . Probiotic Product (PROBIOTIC DAILY PO) Take by mouth.    . triamcinolone cream (KENALOG) 0.1 % apply ON THE SKIN 2 TIMES DAILY AS NEEDED     Current Facility-Administered Medications on File Prior to Visit  Medication Dose Route Frequency Provider Last Rate Last Admin  . 0.9 %  sodium chloride infusion  500 mL Intravenous Once Armbruster, Carlota Raspberry, MD         No Known Allergies  Objective: Darin Mcclure is a pleasant 79  y.o. y.o. Patient Race: White or Caucasian [1]  male in NAD. AAO x 3.  There were no vitals filed for this visit.  Vascular Examination: Neurovascular status unchanged b/l. Capillary refill time to digits immediate b/l. Palpable DP pulses b/l. Palpable PT pulses b/l. Pedal hair present b/l. Skin temperature gradient within normal limits b/l. No edema noted b/l.  Dermatological Examination: Pedal skin with normal turgor, texture and tone bilaterally. No open wounds bilaterally. No interdigital macerations bilaterally. Toenails L 2nd toe, L 3rd toe, R hallux, R 2nd toe and R 3rd toe elongated, dystrophic, thickened, and crumbly with subungual debris and tenderness to dorsal palpation. Right great toenail improved. Proximal 2/3 clear. Hyperkeratotic lesion(s) L 2nd toe and R hallux.  No erythema, no edema, no drainage, no  flocculence.     Subungual hematoma noted of the right 2nd digit. Appears to be subacute with dark heme under the toenail. Nailplate remains adhered. No erythema, no edema, no active drainage, no flocculence, no fluid expressed when pressure applied to digit.  Musculoskeletal: Normal muscle strength 5/5 to all lower extremity muscle groups bilaterally. No pain crepitus or joint limitation noted with ROM b/l. Hammertoes noted to the 1-5 bilaterally.  Neurological Examination: Protective sensation intact 5/5 intact bilaterally with 10g monofilament b/l. Vibratory sensation intact b/l. Patient has subjective symptoms of neuropathy: numbness to tips of toes.  Assessment: 1. Diabetic peripheral neuropathy associated with type 2 diabetes mellitus (HCC)   2. Pain due to onychomycosis of toenails of both feet   3. Corns and callosities    Plan: -Examined patient. -No new findings. No new orders. -Continue diabetic foot care principles. Literature dispensed on today.  -Toenails L 2nd toe, L 3rd toe, L 5th toe, R hallux, R 2nd toe, R 3rd toe and R 5th toe debrided in length and girth without iatrogenic bleeding with sterile nail nipper and dremel.  -Corn(s) L 2nd toe and callus(es) R hallux were pared utilizing sterile scalpel blade without incident. Total number debrided =2. -Patient to continue soft, supportive shoe gear daily. -Patient to report any pedal injuries to medical professional immediately. He was instructed to apply compounded topical antifungal to right great toe twice weekly. -Patient/POA to call should there be question/concern in the interim.  Return in about 3 months (around 07/20/2020) for diabetic nail and callus trim.  Marzetta Board, DPM

## 2020-04-19 NOTE — Patient Instructions (Signed)
Diabetes Mellitus and Foot Care Foot care is an important part of your health, especially when you have diabetes. Diabetes may cause you to have problems because of poor blood flow (circulation) to your feet and legs, which can cause your skin to:  Become thinner and drier.  Break more easily.  Heal more slowly.  Peel and crack. You may also have nerve damage (neuropathy) in your legs and feet, causing decreased feeling in them. This means that you may not notice minor injuries to your feet that could lead to more serious problems. Noticing and addressing any potential problems early is the best way to prevent future foot problems. How to care for your feet Foot hygiene  Wash your feet daily with warm water and mild soap. Do not use hot water. Then, pat your feet and the areas between your toes until they are completely dry. Do not soak your feet as this can dry your skin.  Trim your toenails straight across. Do not dig under them or around the cuticle. File the edges of your nails with an emery board or nail file.  Apply a moisturizing lotion or petroleum jelly to the skin on your feet and to dry, brittle toenails. Use lotion that does not contain alcohol and is unscented. Do not apply lotion between your toes. Shoes and socks  Wear clean socks or stockings every day. Make sure they are not too tight. Do not wear knee-high stockings since they may decrease blood flow to your legs.  Wear shoes that fit properly and have enough cushioning. Always look in your shoes before you put them on to be sure there are no objects inside.  To break in new shoes, wear them for just a few hours a day. This prevents injuries on your feet. Wounds, scrapes, corns, and calluses  Check your feet daily for blisters, cuts, bruises, sores, and redness. If you cannot see the bottom of your feet, use a mirror or ask someone for help.  Do not cut corns or calluses or try to remove them with medicine.  If you  find a minor scrape, cut, or break in the skin on your feet, keep it and the skin around it clean and dry. You may clean these areas with mild soap and water. Do not clean the area with peroxide, alcohol, or iodine.  If you have a wound, scrape, corn, or callus on your foot, look at it several times a day to make sure it is healing and not infected. Check for: ? Redness, swelling, or pain. ? Fluid or blood. ? Warmth. ? Pus or a bad smell. General instructions  Do not cross your legs. This may decrease blood flow to your feet.  Do not use heating pads or hot water bottles on your feet. They may burn your skin. If you have lost feeling in your feet or legs, you may not know this is happening until it is too late.  Protect your feet from hot and cold by wearing shoes, such as at the beach or on hot pavement.  Schedule a complete foot exam at least once a year (annually) or more often if you have foot problems. If you have foot problems, report any cuts, sores, or bruises to your health care provider immediately. Contact a health care provider if:  You have a medical condition that increases your risk of infection and you have any cuts, sores, or bruises on your feet.  You have an injury that is not   healing.  You have redness on your legs or feet.  You feel burning or tingling in your legs or feet.  You have pain or cramps in your legs and feet.  Your legs or feet are numb.  Your feet always feel cold.  You have pain around a toenail. Get help right away if:  You have a wound, scrape, corn, or callus on your foot and: ? You have pain, swelling, or redness that gets worse. ? You have fluid or blood coming from the wound, scrape, corn, or callus. ? Your wound, scrape, corn, or callus feels warm to the touch. ? You have pus or a bad smell coming from the wound, scrape, corn, or callus. ? You have a fever. ? You have a red line going up your leg. Summary  Check your feet every day  for cuts, sores, red spots, swelling, and blisters.  Moisturize feet and legs daily.  Wear shoes that fit properly and have enough cushioning.  If you have foot problems, report any cuts, sores, or bruises to your health care provider immediately.  Schedule a complete foot exam at least once a year (annually) or more often if you have foot problems. This information is not intended to replace advice given to you by your health care provider. Make sure you discuss any questions you have with your health care provider. Document Revised: 08/05/2019 Document Reviewed: 12/14/2016 Elsevier Patient Education  2020 [employer]  

## 2020-05-16 ENCOUNTER — Other Ambulatory Visit: Payer: Self-pay

## 2020-05-16 ENCOUNTER — Ambulatory Visit: Payer: Federal, State, Local not specified - PPO | Admitting: Podiatry

## 2020-05-16 DIAGNOSIS — M778 Other enthesopathies, not elsewhere classified: Secondary | ICD-10-CM

## 2020-05-16 DIAGNOSIS — G4762 Sleep related leg cramps: Secondary | ICD-10-CM

## 2020-05-16 DIAGNOSIS — M7662 Achilles tendinitis, left leg: Secondary | ICD-10-CM

## 2020-05-16 DIAGNOSIS — M7661 Achilles tendinitis, right leg: Secondary | ICD-10-CM | POA: Diagnosis not present

## 2020-05-16 MED ORDER — METHYLPREDNISOLONE 4 MG PO TBPK: ORAL_TABLET | ORAL | 0 refills | Status: DC

## 2020-05-16 MED ORDER — MELOXICAM 15 MG PO TABS: 15.0000 mg | ORAL_TABLET | Freq: Every day | ORAL | 1 refills | Status: DC

## 2020-05-16 MED ORDER — CARISOPRODOL 350 MG PO TABS: 350.0000 mg | ORAL_TABLET | Freq: Every day | ORAL | 0 refills | Status: DC

## 2020-05-16 NOTE — Progress Notes (Signed)
   HPI: 79 y.o. male presenting today for new complaint regarding bilateral foot pain.  Patient reports that he recently returned from a trip to [place] where he did a lot of walking and sightseeing.  Patient states that when he returned from the trip he was having severe bilateral foot pain.  The pain is in the Achilles tendons and feet and he also gets cramping at night.  The pain keeps him up at night and the has the associated cramping in the calves.  He presents for further treatment and evaluation  Past Medical History:  Diagnosis Date  . Allergy   . Blood transfusion without reported diagnosis   . Coronary artery disease    cath/stent 2008  . Diabetic polyneuropathy associated with diabetes mellitus due to underlying condition ([place]) 08/26/2017  . GERD (gastroesophageal reflux disease)   . Heart disease   . Hyperglycemia   . Hypertension   . Mixed hyperlipidemia   . Muscle cramp 08/26/2017  . Neuropathy   . Osteoarthritis   . RBBB   . RBBB (right bundle branch block)   . Type 2 diabetes mellitus ([place])      Physical Exam: General: The patient is alert and oriented x3 in no acute distress.  Dermatology: Skin is warm, dry and supple bilateral lower extremities. Negative for open lesions or macerations.  Vascular: Palpable pedal pulses bilaterally. No edema or erythema noted. Capillary refill within normal limits.  Neurological: Epicritic and protective threshold grossly intact bilaterally.   Musculoskeletal Exam: Range of motion within normal limits to all pedal and ankle joints bilateral. Muscle strength 5/5 in all groups bilateral.  Pain on palpation to the Achilles tendons bilateral.  There is also pain diffusely throughout the bilateral feet.  Assessment: 1.  Acute inflammatory capsulitis bilateral feet secondary to overuse 2.  Achilles tendinitis bilateral 3.  Nocturnal leg cramps bilateral   Plan of Care:  1. Patient evaluated.  2.  Prescription for Soma 350 mg  nightly 3.  Prescription for Medrol Dosepak 4.  Prescription for meloxicam 15 mg daily to take after completing the Dosepak 5.  Recommend conservative modalities including reduced activity and good supportive shoes. 6.  Return to clinic in 3 weeks      Edrick Kins, DPM [facility]  Dr. Edrick Kins, DPM    [address], [place] [address]                Office [phone]  Fax [phone]

## 2020-05-17 DIAGNOSIS — M7542 Impingement syndrome of left shoulder: Secondary | ICD-10-CM | POA: Insufficient documentation

## 2020-06-06 ENCOUNTER — Other Ambulatory Visit: Payer: Self-pay

## 2020-06-06 ENCOUNTER — Ambulatory Visit: Payer: Federal, State, Local not specified - PPO | Admitting: Podiatry

## 2020-06-06 ENCOUNTER — Encounter: Payer: Self-pay | Admitting: Podiatry

## 2020-06-06 DIAGNOSIS — M7661 Achilles tendinitis, right leg: Secondary | ICD-10-CM | POA: Diagnosis not present

## 2020-06-06 DIAGNOSIS — E1142 Type 2 diabetes mellitus with diabetic polyneuropathy: Secondary | ICD-10-CM | POA: Diagnosis not present

## 2020-06-06 DIAGNOSIS — G4762 Sleep related leg cramps: Secondary | ICD-10-CM | POA: Diagnosis not present

## 2020-06-06 DIAGNOSIS — M7662 Achilles tendinitis, left leg: Secondary | ICD-10-CM | POA: Diagnosis not present

## 2020-06-06 MED ORDER — MELOXICAM 15 MG PO TABS: 15.0000 mg | ORAL_TABLET | Freq: Every day | ORAL | 1 refills | Status: DC

## 2020-06-06 MED ORDER — CARISOPRODOL 350 MG PO TABS: 350.0000 mg | ORAL_TABLET | Freq: Every day | ORAL | 0 refills | Status: DC

## 2020-06-06 NOTE — Progress Notes (Signed)
   HPI: 79 y.o. male presenting today for follow-up evaluation of nocturnal foot and leg cramping as well as Achilles tendinitis to the bilateral lower extremities.  Patient states he is doing much better.  He is taking the meloxicam and Soma nightly with significant relief.  He also completed the Medrol Dosepak which helped significantly improve his pain.  He states that he still occasionally has cramping in the middle of the night particularly to his feet.  He has to stand up and stretch to alleviate his cramping.  He presents for further treatment evaluation   Past Medical History:  Diagnosis Date  . Allergy   . Blood transfusion without reported diagnosis   . Coronary artery disease    cath/stent 2008  . Diabetic polyneuropathy associated with diabetes mellitus due to underlying condition ([place]) 08/26/2017  . GERD (gastroesophageal reflux disease)   . Heart disease   . Hyperglycemia   . Hypertension   . Mixed hyperlipidemia   . Muscle cramp 08/26/2017  . Neuropathy   . Osteoarthritis   . RBBB   . RBBB (right bundle branch block)   . Type 2 diabetes mellitus ([place])      Physical Exam: General: The patient is alert and oriented x3 in no acute distress.  Dermatology: Skin is warm, dry and supple bilateral lower extremities. Negative for open lesions or macerations.  Vascular: Palpable pedal pulses bilaterally. No edema or erythema noted. Capillary refill within normal limits.  Neurological: Epicritic and protective threshold grossly intact bilaterally.   Musculoskeletal Exam: Range of motion within normal limits to all pedal and ankle joints bilateral. Muscle strength 5/5 in all groups bilateral.  Pain on palpation to the Achilles tendons bilateral.  There is also pain diffusely throughout the bilateral feet.  Assessment: 1.  Acute inflammatory capsulitis bilateral feet secondary to overuse 2.  Achilles tendinitis bilateral 3.  Nocturnal leg and foot cramping bilateral   Plan of  Care:  1. Patient evaluated.  Begin to taper off of the Soma and meloxicam over the next 2 weeks. 2.  Refill for Soma 350 mg nightly 3.  Refill for meloxicam 15 mg daily 4.  Recommend conservative modalities including reduced activity and good supportive shoes. 5.  Return to clinic as needed      Edrick Kins, DPM [facility]  Dr. Edrick Kins, DPM    [address], [place] [address]                Office [phone]  Fax [phone]

## 2020-06-10 ENCOUNTER — Other Ambulatory Visit: Payer: Self-pay | Admitting: Podiatry

## 2020-07-08 ENCOUNTER — Other Ambulatory Visit: Payer: Self-pay | Admitting: Podiatry

## 2020-07-13 ENCOUNTER — Telehealth: Payer: Self-pay | Admitting: Podiatry

## 2020-07-13 MED ORDER — CARISOPRODOL 250 MG PO TABS: 350.0000 mg | ORAL_TABLET | Freq: Four times a day (QID) | ORAL | 0 refills | Status: DC

## 2020-07-13 NOTE — Addendum Note (Signed)
Addended by: Boneta Lucks on: 07/13/2020 11:39 AM   Modules accepted: Orders

## 2020-07-13 NOTE — Telephone Encounter (Signed)
Pt calling to request refill of Soma.   Please send to [facility]

## 2020-07-18 ENCOUNTER — Ambulatory Visit (INDEPENDENT_AMBULATORY_CARE_PROVIDER_SITE_OTHER): Payer: Federal, State, Local not specified - PPO | Admitting: Podiatry

## 2020-07-18 ENCOUNTER — Encounter: Payer: Self-pay | Admitting: Podiatry

## 2020-07-18 ENCOUNTER — Other Ambulatory Visit: Payer: Self-pay

## 2020-07-18 DIAGNOSIS — M79675 Pain in left toe(s): Secondary | ICD-10-CM | POA: Diagnosis not present

## 2020-07-18 DIAGNOSIS — M79674 Pain in right toe(s): Secondary | ICD-10-CM | POA: Diagnosis not present

## 2020-07-18 DIAGNOSIS — E1142 Type 2 diabetes mellitus with diabetic polyneuropathy: Secondary | ICD-10-CM | POA: Diagnosis not present

## 2020-07-18 DIAGNOSIS — B351 Tinea unguium: Secondary | ICD-10-CM

## 2020-07-18 DIAGNOSIS — L84 Corns and callosities: Secondary | ICD-10-CM

## 2020-07-18 NOTE — Progress Notes (Signed)
Subjective: Darin Mcclure presents today preventative diabetic foot care and corn(s) left 2nd toe and callus(es) right hallux and painful mycotic nails b/l.  Pain interferes with ambulation. Aggravating factors include wearing enclosed shoe gear.   He voices no new pedal concerns on today's visit.  Shon Baton, MD is patient's PCP.   Past Medical History:  Diagnosis Date  . Allergy   . Blood transfusion without reported diagnosis   . Coronary artery disease    cath/stent 2008  . Diabetic polyneuropathy associated with diabetes mellitus due to underlying condition ([place]) 08/26/2017  . GERD (gastroesophageal reflux disease)   . Heart disease   . Hyperglycemia   . Hypertension   . Mixed hyperlipidemia   . Muscle cramp 08/26/2017  . Neuropathy   . Osteoarthritis   . RBBB   . RBBB (right bundle branch block)   . Type 2 diabetes mellitus ([place])      Current Outpatient Medications on File Prior to Visit  Medication Sig Dispense Refill  . amoxicillin (AMOXIL) 500 MG capsule TAKE 4 CAPSULES BY MOUTH 1 HOUR PRIOR TO APPT    . aspirin EC 81 MG tablet Take 81 mg by mouth daily.    Marland Kitchen atorvastatin (LIPITOR) 10 MG tablet Take 10 mg by mouth daily.  2  . azelastine (ASTELIN) 0.1 % nasal spray USE 2 SPRAYS IN EACH NOSTRIL 2 TIMES DAILY    . carisoprodol (SOMA) 250 MG tablet Take 1.5 tablets (375 mg total) by mouth 4 (four) times daily. 30 tablet 0  . carisoprodol (SOMA) 350 MG tablet Take 1 tablet (350 mg total) by mouth at bedtime. 30 tablet 0  . cetirizine (ZYRTEC) 10 MG tablet Take 10 mg by mouth daily as needed.     . clopidogrel (PLAVIX) 75 MG tablet Take 75 mg by mouth daily.  2  . fluticasone (FLONASE) 50 MCG/ACT nasal spray Place 1 spray into both nostrils daily as needed.   3  . JANUVIA 100 MG tablet Take 100 mg by mouth daily.  3  . levothyroxine (SYNTHROID) 50 MCG tablet TAKE 1 TABLET BY MOUTH EVERY MORNING ON EMPTY stomach    . levothyroxine (SYNTHROID) 75 MCG tablet Take 75 mcg by  mouth every other day.    . losartan (COZAAR) 100 MG tablet Take 100 mg by mouth daily.  2  . Magnesium 500 MG CAPS Take 1,000 mg by mouth.    . meloxicam (MOBIC) 15 MG tablet Take 1 tablet (15 mg total) by mouth daily. 30 tablet 1  . metFORMIN (GLUCOPHAGE) 1000 MG tablet TAKE 1 TABLET BY MOUTH 2 TIMES DAILY WITH MEALS  2  . Methylcellulose, Laxative, (CITRUCEL PO) Take by mouth.    . methylPREDNISolone (MEDROL DOSEPAK) 4 MG TBPK tablet 6 day dose pack - take as directed 21 tablet 0  . metoprolol succinate (TOPROL-XL) 50 MG 24 hr tablet Take 100 mg by mouth daily.  2  . Multiple Vitamin (MULTIVITAMIN) tablet Take 1 tablet by mouth daily.    . NONFORMULARY OR COMPOUNDED ITEM [facility]: Antifungal topical - Terbinafine 3%, Fluconazole 2%, Tea Tree Oil 5%, Urea 10%, Ibuprofen 2%, in #53ml DMSO suspension. Apply to affected toenail(s) once daily at bedtime or twice daily. 30 each 11  . Omega-3 Fatty Acids (OMEGA-3 FISH OIL PO) Take by mouth.    . ONE TOUCH ULTRA TEST test strip USE 1 TO 2 TIMES A DAY  5  . pantoprazole (PROTONIX) 40 MG tablet Take 1 tablet (40 mg total)  by mouth daily. 90 tablet 3  . Probiotic Product (PROBIOTIC DAILY PO) Take by mouth.    . triamcinolone cream (KENALOG) 0.1 % apply ON THE SKIN 2 TIMES DAILY AS NEEDED     Current Facility-Administered Medications on File Prior to Visit  Medication Dose Route Frequency Provider Last Rate Last Admin  . 0.9 %  sodium chloride infusion  500 mL Intravenous Once Armbruster, Carlota Raspberry, MD         No Known Allergies  Objective: [name] is a pleasant 79 y.o.  Caucasian male in NAD. AAO x 3.  There were no vitals filed for this visit.  Vascular Examination: Neurovascular status unchanged b/l. Capillary refill time to digits immediate b/l. Palpable DP pulses b/l. Palpable PT pulses b/l. Pedal hair present b/l. Skin temperature gradient within normal limits b/l. No edema noted b/l.  Dermatological Examination: Pedal  skin with normal turgor, texture and tone bilaterally. No open wounds bilaterally. No interdigital macerations bilaterally. Toenails L 2nd toe, L 3rd toe, R hallux, R 2nd toe and R 3rd toe elongated, dystrophic, thickened, and crumbly with subungual debris and tenderness to dorsal palpation. Right great toenail improved. Proximal 2/3 clear. Hyperkeratotic lesion(s) L 2nd toe and R hallux.  No erythema, no edema, no drainage, no flocculence.    Subungual hematoma noted of the right 2nd digit remains stable with nailplate remaining adhered. No erythema, no edema, no active drainage, no flocculence, no fluid expressed when pressure applied to digit.  Musculoskeletal: Normal muscle strength 5/5 to all lower extremity muscle groups bilaterally. No pain crepitus or joint limitation noted with ROM b/l. Hammertoes noted to the 1-5 bilaterally.  Neurological Examination: Protective sensation intact 5/5 intact bilaterally with 10g monofilament b/l. Vibratory sensation intact b/l. Patient has subjective symptoms of neuropathy: numbness to tips of toes.  Assessment: 1. Diabetic peripheral neuropathy associated with type 2 diabetes mellitus ([place])   2. Corns   3. Pain due to onychomycosis of toenails of both feet    Plan: -Examined patient. -No new findings. No new orders. -Continue diabetic foot care principles. Literature dispensed on today.  -Toenails L 2nd toe, L 3rd toe, L 5th toe, R hallux, R 2nd toe, R 3rd toe and R 5th toe debrided in length and girth without iatrogenic bleeding with sterile nail nipper and dremel.  -Corn(s) L 2nd toe and callus(es) R hallux were pared utilizing sterile scalpel blade without incident. Total number debrided =2. -Patient to continue soft, supportive shoe gear daily. -Patient to report any pedal injuries to medical professional immediately. He was instructed to apply compounded topical antifungal to right great toe twice weekly. -Patient/POA to call should there be  question/concern in the interim.  Return in about 3 months (around 10/18/2020).  Marzetta Board, DPM

## 2020-07-22 ENCOUNTER — Ambulatory Visit: Payer: Federal, State, Local not specified - PPO | Admitting: Podiatry

## 2020-10-18 ENCOUNTER — Ambulatory Visit: Payer: Federal, State, Local not specified - PPO | Admitting: Podiatry

## 2020-11-28 ENCOUNTER — Other Ambulatory Visit: Payer: Self-pay

## 2020-11-28 ENCOUNTER — Encounter: Payer: Self-pay | Admitting: Podiatry

## 2020-11-28 ENCOUNTER — Ambulatory Visit: Payer: Federal, State, Local not specified - PPO | Admitting: Podiatry

## 2020-11-28 DIAGNOSIS — L84 Corns and callosities: Secondary | ICD-10-CM | POA: Diagnosis not present

## 2020-11-28 DIAGNOSIS — M2042 Other hammer toe(s) (acquired), left foot: Secondary | ICD-10-CM

## 2020-11-28 DIAGNOSIS — M79674 Pain in right toe(s): Secondary | ICD-10-CM

## 2020-11-28 DIAGNOSIS — E1142 Type 2 diabetes mellitus with diabetic polyneuropathy: Secondary | ICD-10-CM | POA: Diagnosis not present

## 2020-11-28 DIAGNOSIS — B351 Tinea unguium: Secondary | ICD-10-CM | POA: Diagnosis not present

## 2020-11-28 DIAGNOSIS — R252 Cramp and spasm: Secondary | ICD-10-CM | POA: Diagnosis not present

## 2020-11-28 DIAGNOSIS — M79675 Pain in left toe(s): Secondary | ICD-10-CM

## 2020-11-28 DIAGNOSIS — M2041 Other hammer toe(s) (acquired), right foot: Secondary | ICD-10-CM

## 2020-11-28 MED ORDER — CARISOPRODOL 250 MG PO TABS: 350.0000 mg | ORAL_TABLET | Freq: Every day | ORAL | 1 refills | Status: DC

## 2020-11-28 MED ORDER — MELOXICAM 15 MG PO TABS: 15.0000 mg | ORAL_TABLET | Freq: Every day | ORAL | 1 refills | Status: DC

## 2020-11-28 NOTE — Progress Notes (Signed)
Subjective: Darin Mcclure presents today preventative diabetic foot care and corn(s) left 2nd toe and callus(es) right hallux and painful mycotic nails b/l.  Pain interferes with ambulation. Aggravating factors include wearing enclosed shoe gear.   He voices no new pedal concerns on today's visit.  He is requesting refill of Soma prescribed by Dr. Amalia Hailey for leg cramps.  Shon Baton, MD is patient's PCP.   Past Medical History:  Diagnosis Date  . Allergy   . Blood transfusion without reported diagnosis   . Coronary artery disease    cath/stent 2008  . Diabetic polyneuropathy associated with diabetes mellitus due to underlying condition ([place]) 08/26/2017  . GERD (gastroesophageal reflux disease)   . Heart disease   . Hyperglycemia   . Hypertension   . Mixed hyperlipidemia   . Muscle cramp 08/26/2017  . Neuropathy   . Osteoarthritis   . RBBB   . RBBB (right bundle branch block)   . Type 2 diabetes mellitus ([place])      Current Outpatient Medications on File Prior to Visit  Medication Sig Dispense Refill  . amoxicillin (AMOXIL) 500 MG capsule TAKE 4 CAPSULES BY MOUTH 1 HOUR PRIOR TO APPT    . aspirin EC 81 MG tablet Take 81 mg by mouth daily.    Marland Kitchen atorvastatin (LIPITOR) 10 MG tablet Take 10 mg by mouth daily.  2  . azelastine (ASTELIN) 0.1 % nasal spray USE 2 SPRAYS IN EACH NOSTRIL 2 TIMES DAILY    . cetirizine (ZYRTEC) 10 MG tablet Take 10 mg by mouth daily as needed.     . clopidogrel (PLAVIX) 75 MG tablet Take 75 mg by mouth daily.  2  . fluticasone (FLONASE) 50 MCG/ACT nasal spray Place 1 spray into both nostrils daily as needed.   3  . JANUVIA 100 MG tablet Take 100 mg by mouth daily.  3  . levothyroxine (SYNTHROID) 50 MCG tablet TAKE 1 TABLET BY MOUTH EVERY MORNING ON EMPTY stomach    . levothyroxine (SYNTHROID) 75 MCG tablet Take 75 mcg by mouth every other day.    . losartan (COZAAR) 100 MG tablet Take 100 mg by mouth daily.  2  . Magnesium 500 MG CAPS Take 1,000 mg by mouth.     . metFORMIN (GLUCOPHAGE) 1000 MG tablet TAKE 1 TABLET BY MOUTH 2 TIMES DAILY WITH MEALS  2  . Methylcellulose, Laxative, (CITRUCEL PO) Take by mouth.    . methylPREDNISolone (MEDROL DOSEPAK) 4 MG TBPK tablet 6 day dose pack - take as directed 21 tablet 0  . metoprolol succinate (TOPROL-XL) 50 MG 24 hr tablet Take 100 mg by mouth daily.  2  . Multiple Vitamin (MULTIVITAMIN) tablet Take 1 tablet by mouth daily.    . NONFORMULARY OR COMPOUNDED ITEM [facility]: Antifungal topical - Terbinafine 3%, Fluconazole 2%, Tea Tree Oil 5%, Urea 10%, Ibuprofen 2%, in #56ml DMSO suspension. Apply to affected toenail(s) once daily at bedtime or twice daily. 30 each 11  . Omega-3 Fatty Acids (OMEGA-3 FISH OIL PO) Take by mouth.    . ONE TOUCH ULTRA TEST test strip USE 1 TO 2 TIMES A DAY  5  . pantoprazole (PROTONIX) 40 MG tablet Take 1 tablet (40 mg total) by mouth daily. 90 tablet 3  . Probiotic Product (PROBIOTIC DAILY PO) Take by mouth.    . triamcinolone cream (KENALOG) 0.1 % apply ON THE SKIN 2 TIMES DAILY AS NEEDED     Current Facility-Administered Medications on File Prior to Visit  Medication Dose Route Frequency Provider Last Rate Last Admin  . 0.9 %  sodium chloride infusion  500 mL Intravenous Once Armbruster, Willaim Rayas, MD         No Known Allergies  Objective: Artavius Stearns is a pleasant 80 y.o.  Caucasian male in NAD. AAO x 3.  There were no vitals filed for this visit.  Vascular Examination: Neurovascular status unchanged b/l. Capillary refill time to digits immediate b/l. Palpable DP pulses b/l. Palpable PT pulses b/l. Pedal hair present b/l. Skin temperature gradient within normal limits b/l. No edema noted b/l.  Dermatological Examination: Pedal skin with normal turgor, texture and tone bilaterally. No open wounds bilaterally. No interdigital macerations bilaterally. Toenails L 2nd toe, L 3rd toe, R hallux, R 2nd toe and R 3rd toe elongated, dystrophic, thickened, and crumbly  with subungual debris and tenderness to dorsal palpation. Right great toenail improved. Proximal 2/3 clear. Hyperkeratotic lesion(s) L 2nd toe and R hallux.  No erythema, no edema, no drainage, no flocculence.    Musculoskeletal: Normal muscle strength 5/5 to all lower extremity muscle groups bilaterally. No pain crepitus or joint limitation noted with ROM b/l. Hammertoes noted to the 1-5 bilaterally.  Neurological Examination: Protective sensation intact 5/5 intact bilaterally with 10g monofilament b/l. Vibratory sensation intact b/l. Patient has subjective symptoms of neuropathy: numbness to tips of toes.  Assessment: 1. Pain due to onychomycosis of toenails of both feet   2. Corns and callosities   3. Hammertoe, bilateral   4. Leg cramps   5. Diabetic peripheral neuropathy associated with type 2 diabetes mellitus (HCC)    Plan: -Examined patient. -No new findings. No new orders. -Continue diabetic foot care principles. Literature dispensed on today.  -Toenails L 2nd toe, L 3rd toe, L 5th toe, R hallux, R 2nd toe, R 3rd toe and R 5th toe debrided in length and girth without iatrogenic bleeding with sterile nail nipper and dremel.  -Corn(s) L 2nd toe and callus(es) R hallux were pared utilizing sterile scalpel blade without incident. Total number debrided =2. -Patient to continue soft, supportive shoe gear daily. -Patient to report any pedal injuries to medical professional immediately. He is to continue applying compounded topical antifungal to right great toe twice weekly. -Patient/POA to call should there be question/concern in the interim.  Return in about 3 months (around 02/26/2021).  [name], DPM

## 2020-11-29 ENCOUNTER — Other Ambulatory Visit: Payer: Self-pay | Admitting: Podiatry

## 2020-12-01 ENCOUNTER — Other Ambulatory Visit: Payer: Self-pay | Admitting: Podiatry

## 2020-12-02 NOTE — Telephone Encounter (Signed)
Please advise 

## 2020-12-19 ENCOUNTER — Ambulatory Visit: Payer: Federal, State, Local not specified - PPO | Admitting: Cardiology

## 2020-12-27 ENCOUNTER — Encounter: Payer: Self-pay | Admitting: *Deleted

## 2020-12-27 ENCOUNTER — Other Ambulatory Visit: Payer: Self-pay

## 2020-12-27 ENCOUNTER — Ambulatory Visit: Payer: Federal, State, Local not specified - PPO | Admitting: Cardiology

## 2020-12-27 ENCOUNTER — Encounter: Payer: Self-pay | Admitting: Cardiology

## 2020-12-27 VITALS — BP 140/80 | HR 58 | Ht 72.0 in | Wt 247.0 lb

## 2020-12-27 DIAGNOSIS — R072 Precordial pain: Secondary | ICD-10-CM

## 2020-12-27 DIAGNOSIS — I10 Essential (primary) hypertension: Secondary | ICD-10-CM

## 2020-12-27 DIAGNOSIS — E785 Hyperlipidemia, unspecified: Secondary | ICD-10-CM | POA: Diagnosis not present

## 2020-12-27 DIAGNOSIS — I4510 Unspecified right bundle-branch block: Secondary | ICD-10-CM | POA: Diagnosis not present

## 2020-12-27 DIAGNOSIS — R079 Chest pain, unspecified: Secondary | ICD-10-CM

## 2020-12-27 DIAGNOSIS — I2510 Atherosclerotic heart disease of native coronary artery without angina pectoris: Secondary | ICD-10-CM

## 2020-12-27 NOTE — Patient Instructions (Addendum)
Medication Instructions:  The current medical regimen is effective;  continue present plan and medications.  *If you need a refill on your cardiac medications before your next appointment, please call your pharmacy*  Testing/Procedures: Your physician has requested that you have an echocardiogram. Echocardiography is a painless test that uses sound waves to create images of your heart. It provides your doctor with information about the size and shape of your heart and how well your heart's chambers and valves are working. This procedure takes approximately one hour. There are no restrictions for this procedure.  Your physician has requested that you have a lexiscan myoview. For further information please visit [email or web address]. Please follow instruction sheet, as given.  Follow-Up: At [facility], you and your health needs are our priority.  As part of our continuing mission to provide you with exceptional heart care, we have created designated Provider Care Teams.  These Care Teams include your primary Cardiologist (physician) and Advanced Practice Providers (APPs -  Physician Assistants and Nurse Practitioners) who all work together to provide you with the care you need, when you need it.  We recommend signing up for the patient portal called "MyChart".  Sign up information is provided on this After Visit Summary.  MyChart is used to connect with patients for Virtual Visits (Telemedicine).  Patients are able to view lab/test results, encounter notes, upcoming appointments, etc.  Non-urgent messages can be sent to your provider as well.   To learn more about what you can do with MyChart, go to [email or web address].    Your next appointment:   12 month(s)  The format for your next appointment:   In Person  Provider:   Mark Skains, MD   Thank you for choosing [facility]!!     

## 2020-12-27 NOTE — Progress Notes (Signed)
Cardiology Office Note:    Date:  12/27/2020   ID:  Darin Mcclure, DOB [birth year], MRN [id number]  PCP:  Chesley Noon, MD  [facility] Cardiologist:  Candee Furbish, MD  [facility] Electrophysiologist:  None   Referring MD: Shon Baton, MD    History of Present Illness:    Darin Mcclure is a 80 y.o. male here for follow-up CAD, prior early generation Cypher stent placed on 10/04/2006 to mid right coronary artery.  Ostium of the left main was normal by intravascular ultrasound.  Repeat cardiac catheterization in 2009 showed normal right coronary and moderate disease involving the LAD and diagonal branches.  Overall been doing quite well however he is having some left-sided chest discomfort upper chest above left breast.  Concerning to him.  Comes and goes.  Sometimes lasts for up to an hour duration.  Mild carotid plaque.  He works for [employer] for 50 years.  He has had discussions in the past about rationale for which stress testing.  Past Medical History:  Diagnosis Date  . Allergy   . Blood transfusion without reported diagnosis   . Coronary artery disease    cath/stent 2008  . Diabetic polyneuropathy associated with diabetes mellitus due to underlying condition ([place]) 08/26/2017  . GERD (gastroesophageal reflux disease)   . Heart disease   . Hyperglycemia   . Hypertension   . Mixed hyperlipidemia   . Muscle cramp 08/26/2017  . Neuropathy   . Osteoarthritis   . RBBB   . RBBB (right bundle branch block)   . Type 2 diabetes mellitus ([place])     Past Surgical History:  Procedure Laterality Date  . ACHILLES TENDON REPAIR Right   . BACK SURGERY    . CORONARY ANGIOPLASTY     pror stent to mid RCA 10/04/2006  . FOOT SURGERY     Cyst removal  . INGUINAL HERNIA REPAIR    . PERCUTANEOUS CORONARY STENT INTERVENTION (PCI-S)    . REPLACEMENT TOTAL KNEE Left   . TOTAL HIP ARTHROPLASTY Right   . TOTAL HIP ARTHROPLASTY Left     Current Medications: Current  Meds  Medication Sig  . amoxicillin (AMOXIL) 500 MG capsule TAKE 4 CAPSULES BY MOUTH 1 HOUR PRIOR TO APPT  . aspirin EC 81 MG tablet Take 81 mg by mouth daily.  Marland Kitchen atorvastatin (LIPITOR) 10 MG tablet Take 10 mg by mouth daily.  Marland Kitchen azelastine (ASTELIN) 0.1 % nasal spray USE 2 SPRAYS IN EACH NOSTRIL 2 TIMES DAILY  . carisoprodol (SOMA) 250 MG tablet TAKE ONE AND A HALF TABLETS BY MOUTH 4 TIMES DAILY  . cetirizine (ZYRTEC) 10 MG tablet Take 10 mg by mouth daily as needed.   . clopidogrel (PLAVIX) 75 MG tablet Take 75 mg by mouth daily.  . fluticasone (FLONASE) 50 MCG/ACT nasal spray Place 1 spray into both nostrils daily as needed.   Marland Kitchen JANUVIA 100 MG tablet Take 100 mg by mouth daily.  Marland Kitchen levothyroxine (SYNTHROID) 50 MCG tablet TAKE 1 TABLET BY MOUTH EVERY MORNING ON EMPTY stomach  . levothyroxine (SYNTHROID) 75 MCG tablet Take 75 mcg by mouth every other day.  . losartan (COZAAR) 100 MG tablet Take 100 mg by mouth daily.  . Magnesium 500 MG CAPS Take 1,000 mg by mouth.  . meloxicam (MOBIC) 15 MG tablet Take 1 tablet (15 mg total) by mouth daily.  . metFORMIN (GLUCOPHAGE) 1000 MG tablet TAKE 1 TABLET BY MOUTH 2 TIMES DAILY WITH MEALS  . Methylcellulose, Laxative, (  CITRUCEL PO) Take by mouth.  . methylPREDNISolone (MEDROL DOSEPAK) 4 MG TBPK tablet 6 day dose pack - take as directed  . metoprolol succinate (TOPROL-XL) 50 MG 24 hr tablet Take 100 mg by mouth daily.  . Multiple Vitamin (MULTIVITAMIN) tablet Take 1 tablet by mouth daily.  . NONFORMULARY OR COMPOUNDED ITEM [facility]: Antifungal topical - Terbinafine 3%, Fluconazole 2%, Tea Tree Oil 5%, Urea 10%, Ibuprofen 2%, in #57ml DMSO suspension. Apply to affected toenail(s) once daily at bedtime or twice daily.  . Omega-3 Fatty Acids (OMEGA-3 FISH OIL PO) Take by mouth.  . ONE TOUCH ULTRA TEST test strip USE 1 TO 2 TIMES A DAY  . pantoprazole (PROTONIX) 40 MG tablet Take 1 tablet (40 mg total) by mouth daily.  . Probiotic Product  (PROBIOTIC DAILY PO) Take by mouth.  . triamcinolone cream (KENALOG) 0.1 % apply ON THE SKIN 2 TIMES DAILY AS NEEDED   Current Facility-Administered Medications for the 12/27/20 encounter (Office Visit) with Jerline Pain, MD  Medication  . 0.9 %  sodium chloride infusion     Allergies:   Patient has no known allergies.   Social History   Socioeconomic History  . Marital status: Single    Spouse name: Not on file  . Number of children: 2  . Years of education: Not on file  . Highest education level: Not on file  Occupational History  . Occupation: retired  Tobacco Use  . Smoking status: Never Smoker  . Smokeless tobacco: Never Used  Vaping Use  . Vaping Use: Never used  Substance and Sexual Activity  . Alcohol use: Yes    Comment: Rarely  . Drug use: No  . Sexual activity: Not on file  Other Topics Concern  . 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    Social Determinants of Health   Financial Resource Strain: Not on file  Food Insecurity: Not on file  Transportation Needs: Not on file  Physical Activity: Not on file  Stress: Not on file  Social Connections: Not on file     Family History: The patient's family history includes CAD in his father; Lung cancer in his father. There is no history of Colon cancer, Esophageal cancer, Rectal cancer, or Stomach cancer.  ROS:   Please see the history of present illness.    No fevers chills nausea vomiting syncope bleeding all other systems reviewed and are negative.  EKGs/Labs/Other Studies Reviewed:      EKG:  EKG is  ordered today.  The ekg ordered today demonstrates sinus rhythm 58 with right bundle branch block  Recent Labs: No results found for requested labs within last 8760 hours.  Recent Lipid Panel    Component Value Date/Time   CHOL 100 12/25/2017 0845   TRIG  166 (H) 12/25/2017 0845   HDL 36 (L) 12/25/2017 0845   CHOLHDL 2.8 12/25/2017 0845   LDLCALC 31 12/25/2017 0845     Risk Assessment/Calculations:      Physical Exam:    VS:  BP 140/80 (BP Location: Left Arm, Patient Position: Sitting, Cuff Size: Normal)   Pulse (!) 58   Ht 6' (1.829 m)   Wt 247 lb (112 kg)   SpO2 98%   BMI 33.50 kg/m     Wt Readings from Last 3 Encounters:  12/27/20 247 lb (112 kg)  03/11/20 253 lb (114.8 kg)  12/18/19 252 lb (114.3 kg)     GEN:  Well nourished, well developed in no acute distress HEENT: Normal NECK: No JVD; No carotid bruits LYMPHATICS: No lymphadenopathy CARDIAC: RRR, no murmurs, rubs, gallops RESPIRATORY:  Clear to auscultation without rales, wheezing or rhonchi  ABDOMEN: Soft, non-tender, non-distended MUSCULOSKELETAL:  No edema; No deformity  SKIN: Warm and dry NEUROLOGIC:  Alert and oriented x 3 PSYCHIATRIC:  Normal affect   ASSESSMENT:    1. Coronary artery disease involving native coronary artery of native heart without angina pectoris   2. Essential hypertension   3. Hyperlipidemia, unspecified hyperlipidemia type   4. RBBB   5. Chest pain of uncertain etiology   6. Precordial pain    PLAN:    In order of problems listed above:  Atypical chest pain --left sided ache above left breast, off and on, may last for hours.  -We will check a Lexiscan stress test -We will check echocardiogram since it has been several years.  Make sure we have proper structure and function of his heart.  Coronary artery disease -Prior RCA stent Cypher.  Continue with Plavix.  He has continued with both aspirin and Plavix over the years, no bleeding issues.  Hyperlipidemia -On atorvastatin 10 mg, ideally 40 mg, high intensity dose would be great however his LDL is 32 currently.  Excellent.  Also takes omega-3 which is helped out significantly.  Right bundle branch block -Stable, no changes.  Carotid artery plaque 2020 Doppler  reviewed -1 to 39% bilaterally.  Continue with statin, aspirin.  Blood pressure control.   Shared Decision Making/Informed Consent The risks [chest pain, shortness of breath, cardiac arrhythmias, dizziness, blood pressure fluctuations, myocardial infarction, stroke/transient ischemic attack, nausea, vomiting, allergic reaction, radiation exposure, metallic taste sensation and life-threatening complications (estimated to be 1 in 10,000)], benefits (risk stratification, diagnosing coronary artery disease, treatment guidance) and alternatives of a nuclear stress test were discussed in detail with Mr. Wenger and he agrees to proceed.       Medication Adjustments/Labs and Tests Ordered: Current medicines are reviewed at length with the patient today.  Concerns regarding medicines are outlined above.  Orders Placed This Encounter  Procedures  . MYOCARDIAL PERFUSION IMAGING  . EKG 12-Lead  . ECHOCARDIOGRAM COMPLETE   No orders of the defined types were placed in this encounter.   Patient Instructions  Medication Instructions:  The current medical regimen is effective;  continue present plan and medications.  *If you need a refill on your cardiac medications before your next appointment, please call your pharmacy*  Testing/Procedures: Your physician has requested that you have an echocardiogram. Echocardiography is a painless test that uses sound waves to create images of your heart. It provides your doctor with information about the size and shape of your heart and how well your heart's chambers and valves are working. This procedure takes approximately one hour. There are no restrictions for this procedure.  Your physician has requested that you have a lexiscan myoview. For further information please visit [email or web address]. Please follow instruction sheet, as given.  Follow-Up: At [facility], you and your health needs are our priority.  As part of our continuing mission to  provide you with exceptional heart care, we have created designated Provider Care Teams.  These Care Teams include your primary Cardiologist (physician) and Advanced Practice Providers (APPs -  Physician Assistants and Nurse Practitioners) who all work together to provide you  with the care you need, when you need it.  We recommend signing up for the patient portal called "MyChart".  Sign up information is provided on this After Visit Summary.  MyChart is used to connect with patients for Virtual Visits (Telemedicine).  Patients are able to view lab/test results, encounter notes, upcoming appointments, etc.  Non-urgent messages can be sent to your provider as well.   To learn more about what you can do with MyChart, go to [email or web address].    Your next appointment:   12 month(s)  The format for your next appointment:   In Person  Provider:   Candee Furbish, MD   Thank you for choosing [facility]!!        Signed, Candee Furbish, MD  12/27/2020 12:21 PM    [place]

## 2021-01-06 ENCOUNTER — Telehealth (HOSPITAL_COMMUNITY): Payer: Self-pay

## 2021-01-11 NOTE — Telephone Encounter (Signed)
Spoke with the patient, detailed instructions given. He stated that he will be there for his test. Asked to call back with any questions. S.Gabriella Guile EMTP

## 2021-01-12 ENCOUNTER — Ambulatory Visit (HOSPITAL_COMMUNITY): Payer: Federal, State, Local not specified - PPO | Attending: Internal Medicine

## 2021-01-12 ENCOUNTER — Ambulatory Visit (HOSPITAL_BASED_OUTPATIENT_CLINIC_OR_DEPARTMENT_OTHER): Payer: Federal, State, Local not specified - PPO

## 2021-01-12 ENCOUNTER — Other Ambulatory Visit: Payer: Self-pay

## 2021-01-12 DIAGNOSIS — R072 Precordial pain: Secondary | ICD-10-CM

## 2021-01-12 DIAGNOSIS — I2510 Atherosclerotic heart disease of native coronary artery without angina pectoris: Secondary | ICD-10-CM

## 2021-01-12 DIAGNOSIS — I10 Essential (primary) hypertension: Secondary | ICD-10-CM

## 2021-01-12 LAB — ECHOCARDIOGRAM COMPLETE
Area-P 1/2: 3.06 cm2
S' Lateral: 3.2 cm

## 2021-01-12 LAB — MYOCARDIAL PERFUSION IMAGING
LV dias vol: 129 mL (ref 62–150)
LV sys vol: 41 mL
Peak HR: 69 {beats}/min
Rest HR: 53 {beats}/min
SDS: 1
SRS: 1
SSS: 2
TID: 1.02

## 2021-01-12 MED ORDER — PERFLUTREN LIPID MICROSPHERE
1.0000 mL | INTRAVENOUS | Status: AC | PRN
  Administered 2021-01-12: 1 mL via INTRAVENOUS

## 2021-01-12 MED ORDER — TECHNETIUM TC 99M TETROFOSMIN IV KIT
10.1000 | PACK | Freq: Once | INTRAVENOUS | Status: AC | PRN
  Administered 2021-01-12: 10.1 via INTRAVENOUS
  Filled 2021-01-12: qty 11

## 2021-01-12 MED ORDER — REGADENOSON 0.4 MG/5ML IV SOLN
0.4000 mg | Freq: Once | INTRAVENOUS | Status: AC
  Administered 2021-01-12: 0.4 mg via INTRAVENOUS

## 2021-01-12 MED ORDER — TECHNETIUM TC 99M TETROFOSMIN IV KIT
32.6000 | PACK | Freq: Once | INTRAVENOUS | Status: AC | PRN
  Administered 2021-01-12: 32.6 via INTRAVENOUS
  Filled 2021-01-12: qty 33

## 2021-02-27 ENCOUNTER — Ambulatory Visit: Payer: Federal, State, Local not specified - PPO | Admitting: Podiatry

## 2021-03-13 ENCOUNTER — Other Ambulatory Visit: Payer: Self-pay

## 2021-03-13 ENCOUNTER — Ambulatory Visit: Payer: Federal, State, Local not specified - PPO | Admitting: Neurology

## 2021-03-13 ENCOUNTER — Encounter: Payer: Self-pay | Admitting: Neurology

## 2021-03-13 VITALS — BP 166/90 | HR 77 | Ht 72.0 in | Wt 250.0 lb

## 2021-03-13 DIAGNOSIS — E1142 Type 2 diabetes mellitus with diabetic polyneuropathy: Secondary | ICD-10-CM | POA: Diagnosis not present

## 2021-03-13 DIAGNOSIS — G3184 Mild cognitive impairment, so stated: Secondary | ICD-10-CM | POA: Diagnosis not present

## 2021-03-13 NOTE — Progress Notes (Signed)
Follow-up Visit   Date: 03/13/21    Darin Mcclure MRN: [id number] DOB: [birth year]   Interim History: Darin Mcclure is a 80 y.o. right-handed Caucasian male with hypertensin, hyperlipidemia, diabetes mellitus, CAD s/p PCI with stent, and GERD returning to the clinic for follow-up of neuropathy.    History of present illness: He started having numbness of toes, which had progressed over the years to involve his foot and lower leg.  He was diagnosed with neuropathy and seen at [facility] and many years later, was diagnosed with diabetes mellitus.  He complains that his left foot feels heavy, especially when he wakes up.  He has very brief spells of pulsating sharp pain, lasting 30-min to several hours which can affect both legs.  He saw Dr. Yehuda Savannah, [place]. while living in [place] and moved here in July 2018.  He had NCS/EMG which confirmed neuropathy.    He also complained of being forgetful at times, such as walking into the room and forgetting the reason for being there.  He is able to maintain all instrumental ADLs such as driving, managing finances, and household chores.  UPDATE 03/13/2021:  He is here for 1 year follow-up.  He has noticed increased difficulty with remembering names and experiencing more word-finding difficulty. He does not have other memory issues, such as increased forgetfullness or misplacing things. He is able to manage medications, finances, and household duties.  No issues with driving or navigating.  His neuropathy seems to be stable with numbness in the feet and lower legs. He has rare spells of recurrent sharp pain in the feet, lasting about 15 seconds.  He has many questions about his neuropathy.  He has a hemtoma over the left medial leg which occurred after hitting his leg onto his trailer.   Medications:  Current Outpatient Medications on File Prior to Visit  Medication Sig Dispense Refill  . aspirin EC 81 MG tablet Take 81 mg by mouth daily.    Marland Kitchen  atorvastatin (LIPITOR) 10 MG tablet Take 10 mg by mouth daily.  2  . azelastine (ASTELIN) 0.1 % nasal spray USE 2 SPRAYS IN EACH NOSTRIL 2 TIMES DAILY    . carisoprodol (SOMA) 250 MG tablet TAKE ONE AND A HALF TABLETS BY MOUTH 4 TIMES DAILY 30 tablet 0  . clopidogrel (PLAVIX) 75 MG tablet Take 75 mg by mouth daily.  2  . fluticasone (FLONASE) 50 MCG/ACT nasal spray Place 1 spray into both nostrils daily as needed.   3  . JANUVIA 100 MG tablet Take 100 mg by mouth daily.  3  . levothyroxine (SYNTHROID) 50 MCG tablet TAKE 1 TABLET BY MOUTH EVERY MORNING ON EMPTY stomach    . levothyroxine (SYNTHROID) 75 MCG tablet Take 75 mcg by mouth every other day.    . losartan (COZAAR) 100 MG tablet Take 100 mg by mouth daily.  2  . Magnesium 500 MG CAPS Take 1,000 mg by mouth.    . meloxicam (MOBIC) 15 MG tablet Take 1 tablet (15 mg total) by mouth daily. 60 tablet 1  . metFORMIN (GLUCOPHAGE) 1000 MG tablet TAKE 1 TABLET BY MOUTH 2 TIMES DAILY WITH MEALS  2  . Methylcellulose, Laxative, (CITRUCEL PO) Take by mouth.    . metoprolol succinate (TOPROL-XL) 50 MG 24 hr tablet Take 100 mg by mouth daily.  2  . Multiple Vitamin (MULTIVITAMIN) tablet Take 1 tablet by mouth daily.    . NONFORMULARY OR COMPOUNDED ITEM [facility]: Antifungal topical -  Terbinafine 3%, Fluconazole 2%, Tea Tree Oil 5%, Urea 10%, Ibuprofen 2%, in #26ml DMSO suspension. Apply to affected toenail(s) once daily at bedtime or twice daily. 30 each 11  . Omega-3 Fatty Acids (OMEGA-3 FISH OIL PO) Take by mouth.    . ONE TOUCH ULTRA TEST test strip USE 1 TO 2 TIMES A DAY  5  . Probiotic Product (PROBIOTIC DAILY PO) Take by mouth.    Marland Kitchen amoxicillin (AMOXIL) 500 MG capsule TAKE 4 CAPSULES BY MOUTH 1 HOUR PRIOR TO APPT (Patient not taking: Reported on 03/13/2021)    . cetirizine (ZYRTEC) 10 MG tablet Take 10 mg by mouth daily as needed.  (Patient not taking: Reported on 03/13/2021)    . methylPREDNISolone (MEDROL DOSEPAK) 4 MG TBPK tablet 6 day  dose pack - take as directed (Patient not taking: Reported on 03/13/2021) 21 tablet 0  . pantoprazole (PROTONIX) 40 MG tablet Take 1 tablet (40 mg total) by mouth daily. (Patient not taking: Reported on 03/13/2021) 90 tablet 3  . triamcinolone cream (KENALOG) 0.1 % apply ON THE SKIN 2 TIMES DAILY AS NEEDED (Patient not taking: Reported on 03/13/2021)     Current Facility-Administered Medications on File Prior to Visit  Medication Dose Route Frequency Provider Last Rate Last Admin  . 0.9 %  sodium chloride infusion  500 mL Intravenous Once Armbruster, Carlota Raspberry, MD        Allergies: No Known Allergies  Vital Signs:  BP (!) 166/90   Pulse 77   Ht 6' (1.829 m)   Wt 250 lb (113.4 kg)   SpO2 99%   BMI 33.91 kg/m   Neurological Exam: MENTAL STATUS including orientation to time, place, person, recent and remote memory, attention span and concentration, language, and fund of knowledge is normal.  Speech is not dysarthric.  CRANIAL NERVES:    Extraocular muscles intact. Face is symmetric.  MOTOR:  Motor strength is 5/5 in all extremities, including distally. No pronator drift.  Tone is normal.  Moderately sized healing hematoma over the left upper medial leg.   SENSORY:  Absent vibration at the ankles; diminished pin prick and temperature distal to ankles bilaterally.  Rhomberg sign is very mildly positive.  REFLEXES:  Reflexes are 2+/4 throughout.   COORDINATION/GAIT:   Gait narrow based and stable, mildly wide-based. Tandem and stressed gait intact.  Unassisted  Data: Labs 08/03/2016:  Folate >24, TSH 2.37, Lyme neg, RF neg, CCP neg, HbA1c 6.3, ANA neg, vitamin B12 309 Labs 08/26/2017:  Vitamin B12 298, copper 78, MMA 165, SPEP with IFE no M protein Lab Results  Component Value Date   VITAMINB12 391 12/17/2018    TCD 08/03/2016:  Normal EEG 08/03/2016:  Normal  Cognitive testing 08/03/2016:  Normal NCS/EMG of the legs 08/03/2016:  Diffuse sensorimotor polyneuropathy US carotids 12/15/2018:   1-39% bilateral ICA stenosis  Lab Results  Component Value Date   HGBA1C 6.9 (H) 12/17/2018     IMPRESSION/PLAN: 1.  Diabetic neuropathy manifesting with numbness in the feet and lower legs.  He reports occasional pain in the feet, not frequent enough to start medication. Diabetes is relatively well-controlled with last Hb1c 7.0  He had many questions which I addressed to the best of my ability  Continue supportive care  2.  Mild cognitive impairment with word-finding difficulty.  He is highly independent, able to perform all ADLs and IADLs.  No signs of dementia  - Reassurance provided  - If symptoms progression, neurocognitive testing can be ordered.  Thank you for allowing me to participate in patient's care.  If I can answer any additional questions, I would be pleased to do so.    Sincerely,    Denzell Colasanti K. Posey Pronto, DO

## 2021-03-13 NOTE — Patient Instructions (Signed)
Return to clinic 1 year  

## 2021-04-18 ENCOUNTER — Telehealth: Payer: Self-pay

## 2021-04-18 ENCOUNTER — Other Ambulatory Visit: Payer: Self-pay

## 2021-04-18 ENCOUNTER — Encounter: Payer: Self-pay | Admitting: Podiatry

## 2021-04-18 ENCOUNTER — Ambulatory Visit: Payer: Federal, State, Local not specified - PPO | Admitting: Podiatry

## 2021-04-18 DIAGNOSIS — M79674 Pain in right toe(s): Secondary | ICD-10-CM

## 2021-04-18 DIAGNOSIS — E1142 Type 2 diabetes mellitus with diabetic polyneuropathy: Secondary | ICD-10-CM

## 2021-04-18 DIAGNOSIS — M79675 Pain in left toe(s): Secondary | ICD-10-CM | POA: Diagnosis not present

## 2021-04-18 DIAGNOSIS — L84 Corns and callosities: Secondary | ICD-10-CM

## 2021-04-18 DIAGNOSIS — B351 Tinea unguium: Secondary | ICD-10-CM | POA: Diagnosis not present

## 2021-04-18 NOTE — Telephone Encounter (Signed)
Prescription and demographics faxed to  [facility]: Antifungal topical - Terbinafine 3%, Fluconazole 2%, Tea Tree Oil 5%, Urea 10%, Ibuprofen 2%, in #27ml DMSO suspension. Apply to affected toenail(s) once daily at bedtime or twice daily.

## 2021-04-18 NOTE — Patient Instructions (Signed)
We have written a prescription to [facility] in [place] for your antifungal toenail solution. They will call you regarding the prescription. If you do not hear from them, please call the patient line.  Patient Phone Line: [phone]  The prescription will be mailed to your home within one week.

## 2021-04-19 DIAGNOSIS — K219 Gastro-esophageal reflux disease without esophagitis: Secondary | ICD-10-CM | POA: Insufficient documentation

## 2021-04-23 NOTE — Progress Notes (Signed)
Subjective: Darin Mcclure is a pleasant 80 y.o. male patient seen today for preventative diabetic foot care with corn(s) and painful thick toenails that are difficult to trim. Pain interferes with ambulation. Aggravating factors include wearing enclosed shoe gear. Pain is relieved with periodic professional debridement.  He would like to discuss topical antifungal therapy of toenails today. We had decreased frequency to twice weekly and it seems the right hallux may be   PCP is Chesley Noon, MD. Last visit was: 03/27/2021.  No Known Allergies  Objective: Physical Exam  General: Darin Mcclure is a pleasant 80 y.o. Caucasian male, WD, WN in NAD. AAO x 3.   Vascular:  Capillary refill time to digits immediate b/l. Palpable pedal pulses b/l LE. Pedal hair present. Lower extremity skin temperature gradient within normal limits.  Dermatological:  Pedal skin with normal turgor, texture and tone bilaterally. No open wounds bilaterally. No interdigital macerations bilaterally. Toenails L 2nd toe, L 3rd toe, R hallux, R 2nd toe and R 3rd toe elongated, discolored, dystrophic, thickened, and crumbly with subungual debris and tenderness to dorsal palpation. Hyperkeratotic lesion(s) L 2nd toe and R hallux.  No erythema, no edema, no drainage, no fluctuance.  Musculoskeletal:  Normal muscle strength 5/5 to all lower extremity muscle groups bilaterally. No pain crepitus or joint limitation noted with ROM b/l. Hammertoe(s) noted to the 1-5 bilaterally.  Neurological:  Pt has subjective symptoms of neuropathy. Protective sensation intact 5/5 intact bilaterally with 10g monofilament b/l.  Assessment and Plan:  1. Diabetic peripheral neuropathy associated with type 2 diabetes mellitus (HCC)   2. Pain due to onychomycosis of toenails of both feet   3. Corns and callosities     -Examined patient. -Patient to continue soft, supportive shoe gear daily. -Mycotic toenails were debrided in length and  girth L 2nd toe, L 3rd toe, L 5th toe, R hallux, R 2nd toe, R 3rd toe and R 5th toe with sterile nail nippers and dremel without iatrogenic bleeding. Continue daily use of topical compounded antifungal medication from [place] as instructed. -Corn(s) L 2nd toe and callus(es) R hallux were pared utilizing sterile scalpel blade without incident. Total number debrided =2. -Patient to report any pedal injuries to medical professional immediately. -Resume compounded topical antifungal to affected toenails once daily. New Rx sent to [facility] today. -Patient/POA to call should there be question/concern in the interim.  Return in about 3 months (around 07/19/2021).  Marzetta Board, DPM

## 2021-05-30 ENCOUNTER — Ambulatory Visit: Payer: Federal, State, Local not specified - PPO | Admitting: Podiatry

## 2021-07-24 ENCOUNTER — Encounter: Payer: Self-pay | Admitting: Podiatry

## 2021-07-24 ENCOUNTER — Other Ambulatory Visit: Payer: Self-pay

## 2021-07-24 ENCOUNTER — Ambulatory Visit (INDEPENDENT_AMBULATORY_CARE_PROVIDER_SITE_OTHER): Payer: Federal, State, Local not specified - PPO | Admitting: Podiatry

## 2021-07-24 DIAGNOSIS — M79675 Pain in left toe(s): Secondary | ICD-10-CM | POA: Diagnosis not present

## 2021-07-24 DIAGNOSIS — B351 Tinea unguium: Secondary | ICD-10-CM

## 2021-07-24 DIAGNOSIS — E1142 Type 2 diabetes mellitus with diabetic polyneuropathy: Secondary | ICD-10-CM

## 2021-07-24 DIAGNOSIS — L84 Corns and callosities: Secondary | ICD-10-CM | POA: Diagnosis not present

## 2021-07-24 DIAGNOSIS — M79674 Pain in right toe(s): Secondary | ICD-10-CM | POA: Diagnosis not present

## 2021-07-26 NOTE — Progress Notes (Signed)
Subjective: Darin Mcclure is a pleasant 80 y.o. male patient seen today for preventative diabetic foot care with corn(s) and painful thick toenails that are difficult to trim. Pain interferes with ambulation. Aggravating factors include wearing enclosed shoe gear. Pain is relieved with periodic professional debridement.  He continues to use compounded topical antifungal from [place] for his toenails. He will be having cataract surgery on this Wednesday.  PCP is Chesley Noon, MD. Last visit was: 06/27/2021.  No Known Allergies  Objective: Physical Exam  General: Darin Mcclure is a pleasant 80 y.o. Caucasian male, WD, WN in NAD. AAO x 3.   Vascular:  Capillary refill time to digits immediate b/l. Palpable pedal pulses b/l LE. Pedal hair present. Lower extremity skin temperature gradient within normal limits.  Dermatological:  Pedal skin with normal turgor, texture and tone bilaterally. No open wounds bilaterally. No interdigital macerations bilaterally. Toenails L 2nd toe, L 3rd toe, R hallux, R 2nd toe and R 3rd toe elongated, discolored, dystrophic, thickened, and crumbly with subungual debris and tenderness to dorsal palpation. Hyperkeratotic lesion(s) L 2nd toe and R hallux.  No erythema, no edema, no drainage, no fluctuance.  Musculoskeletal:  Normal muscle strength 5/5 to all lower extremity muscle groups bilaterally. No pain crepitus or joint limitation noted with ROM b/l. Hammertoe(s) noted to the 1-5 bilaterally.  Neurological:  Pt has subjective symptoms of neuropathy. Protective sensation intact 5/5 intact bilaterally with 10g monofilament b/l.  Assessment and Plan:  1. Diabetic peripheral neuropathy associated with type 2 diabetes mellitus (HCC)   2. Pain due to onychomycosis of toenails of both feet   3. Corns and callosities      -No new findings. No new orders. -Patient to continue soft, supportive shoe gear daily. -Patient instructed to continue  topical antifungal solution to toenails once daily from [place]. -Mycotic toenails were debrided in length and girth L 2nd toe, L 3rd toe, L 5th toe, R hallux, R 2nd toe, R 3rd toe, and R 5th toe with sterile nail nippers and dremel without iatrogenic bleeding. Continue daily use of topical compounded antifungal medication from [place] as instructed. -Corn(s) L 2nd toe and callus(es) R hallux were pared utilizing sterile scalpel blade without incident. Total number debrided =2. -Patient to report any pedal injuries to medical professional immediately. -Patient/POA to call should there be question/concern in the interim.  Return in about 3 months (around 10/24/2021).  Marzetta Board, DPM

## 2021-10-25 ENCOUNTER — Ambulatory Visit: Payer: Federal, State, Local not specified - PPO | Admitting: Podiatry

## 2021-10-25 ENCOUNTER — Other Ambulatory Visit: Payer: Self-pay

## 2021-10-25 ENCOUNTER — Encounter: Payer: Self-pay | Admitting: Podiatry

## 2021-10-25 DIAGNOSIS — M79675 Pain in left toe(s): Secondary | ICD-10-CM

## 2021-10-25 DIAGNOSIS — M2041 Other hammer toe(s) (acquired), right foot: Secondary | ICD-10-CM | POA: Diagnosis not present

## 2021-10-25 DIAGNOSIS — L84 Corns and callosities: Secondary | ICD-10-CM | POA: Diagnosis not present

## 2021-10-25 DIAGNOSIS — B351 Tinea unguium: Secondary | ICD-10-CM

## 2021-10-25 DIAGNOSIS — E1142 Type 2 diabetes mellitus with diabetic polyneuropathy: Secondary | ICD-10-CM | POA: Diagnosis not present

## 2021-10-25 DIAGNOSIS — E119 Type 2 diabetes mellitus without complications: Secondary | ICD-10-CM

## 2021-10-25 DIAGNOSIS — M2042 Other hammer toe(s) (acquired), left foot: Secondary | ICD-10-CM

## 2021-10-25 DIAGNOSIS — M79674 Pain in right toe(s): Secondary | ICD-10-CM

## 2021-10-26 NOTE — Progress Notes (Signed)
ANNUAL DIABETIC FOOT EXAM  Subjective: Darin Mcclure presents today for for annual diabetic foot examination and corn(s) left 2nd toe , callus(es) right hallux and painful mycotic nails.  Pain interferes with ambulation. Aggravating factors include wearing enclosed shoe gear. Painful toenails interfere with ambulation. Aggravating factors include wearing enclosed shoe gear. Pain is relieved with periodic professional debridement. Painful corns and calluses are aggravated when weightbearing with and without shoegear. Pain is relieved with periodic professional debridement..  Patient relates 20 year h/o diabetes.  Patient denies any h/o foot wounds.  Patient has been diagnosed with neuropathy. He has Rx for gabapentin, but does not take it.  Patient's blood sugar was 128 mg/dl this morning.   Chesley Noon, MD is patient's PCP. Last visit was 06/27/2021.  Past Medical History:  Diagnosis Date   Allergy    Blood transfusion without reported diagnosis    Coronary artery disease    cath/stent 2008   Diabetic polyneuropathy associated with diabetes mellitus due to underlying condition ([place]) 08/26/2017   GERD (gastroesophageal reflux disease)    Heart disease    Hyperglycemia    Hypertension    Mixed hyperlipidemia    Muscle cramp 08/26/2017   Neuropathy    Osteoarthritis    RBBB    RBBB (right bundle branch block)    Type 2 diabetes mellitus [facility])    Patient Active Problem List   Diagnosis Date Noted   Gastroesophageal reflux disease without esophagitis 04/19/2021   Impingement syndrome of left shoulder region 05/17/2020   History of prosthetic unicompartmental arthroplasty of left knee 11/18/2019   Pain in left knee 09/24/2018   Pain of left hip joint 09/24/2018   Diabetic polyneuropathy associated with diabetes mellitus due to underlying condition ([place]) 08/26/2017   Muscle cramp 08/26/2017   Past Surgical History:  Procedure Laterality Date   ACHILLES TENDON REPAIR Right     BACK SURGERY     CORONARY ANGIOPLASTY     pror stent to mid RCA 10/04/2006   FOOT SURGERY     Cyst removal   INGUINAL HERNIA REPAIR     PERCUTANEOUS CORONARY STENT INTERVENTION (PCI-S)     REPLACEMENT TOTAL KNEE Left    TOTAL HIP ARTHROPLASTY Right    TOTAL HIP ARTHROPLASTY Left    Current Outpatient Medications on File Prior to Visit  Medication Sig Dispense Refill   carisoprodol (SOMA) 250 MG tablet Take by mouth.     losartan (COZAAR) 100 MG tablet Take 1 tablet by mouth daily.     metoprolol succinate (TOPROL-XL) 50 MG 24 hr tablet Take 1 tablet by mouth 2 (two) times daily.     amoxicillin (AMOXIL) 500 MG capsule TAKE 4 CAPSULES BY MOUTH 1 HOUR PRIOR TO APPT (Patient not taking: Reported on 03/13/2021)     aspirin EC 81 MG tablet Take 81 mg by mouth daily.     atorvastatin (LIPITOR) 10 MG tablet Take 10 mg by mouth daily.  2   atorvastatin (LIPITOR) 10 MG tablet Take 1 tablet by mouth daily.     azelastine (ASTELIN) 0.1 % nasal spray USE 2 SPRAYS IN EACH NOSTRIL 2 TIMES DAILY     Cephalexin 500 MG tablet cephalexin 500 mg tablet  Take 1 tablet every 6 hours by oral route for 7 days.     cetirizine (ZYRTEC) 10 MG tablet Take 10 mg by mouth daily as needed.  (Patient not taking: Reported on 03/13/2021)     clopidogrel (PLAVIX) 75 MG tablet Take 75  mg by mouth daily.  2   clopidogrel (PLAVIX) 75 MG tablet Take 1 tablet by mouth daily.     fluticasone (FLONASE) 50 MCG/ACT nasal spray Place 1 spray into both nostrils daily as needed.   3   gabapentin (NEURONTIN) 100 MG capsule Take by mouth.     guaiFENesin-codeine 100-10 MG/5ML syrup Take by mouth.     JANUVIA 100 MG tablet Take 100 mg by mouth daily.     ketorolac (ACULAR) 0.5 % ophthalmic solution Place 1 drop into the left eye 4 (four) times daily.     levothyroxine (SYNTHROID) 50 MCG tablet TAKE 1 TABLET BY MOUTH EVERY MORNING ON EMPTY stomach     levothyroxine (SYNTHROID) 75 MCG tablet Take 75 mcg by mouth every other day.      levothyroxine (SYNTHROID) 75 MCG tablet Take 1 tablet by mouth daily.     losartan (COZAAR) 100 MG tablet Take 100 mg by mouth daily.  2   Magnesium 500 MG CAPS Take 1,000 mg by mouth.     meloxicam (MOBIC) 15 MG tablet Take 1 tablet (15 mg total) by mouth daily. 60 tablet 1   metFORMIN (GLUCOPHAGE) 1000 MG tablet TAKE 1 TABLET BY MOUTH 2 TIMES DAILY WITH MEALS  2   metFORMIN (GLUCOPHAGE) 1000 MG tablet Take 1 tablet by mouth 2 (two) times daily.     Methylcellulose, Laxative, (CITRUCEL PO) Take by mouth.     methylPREDNISolone (MEDROL DOSEPAK) 4 MG TBPK tablet 6 day dose pack - take as directed (Patient not taking: Reported on 03/13/2021) 21 tablet 0   moxifloxacin (VIGAMOX) 0.5 % ophthalmic solution Place 1 drop into the left eye 4 (four) times daily.     Multiple Vitamin (MULTIVITAMIN) tablet Take 1 tablet by mouth daily.     nirmatrelvir & ritonavir (PAXLOVID) 20 x 150 MG & 10 x 100MG  TBPK Take 3 tablets by mouth 2 (two) times daily.     NONFORMULARY OR COMPOUNDED ITEM [facility]: Antifungal topical - Terbinafine 3%, Fluconazole 2%, Tea Tree Oil 5%, Urea 10%, Ibuprofen 2%, in #59ml DMSO suspension. Apply to affected toenail(s) once daily at bedtime or twice daily. 30 each 11   Omega-3 Fatty Acids (OMEGA-3 FISH OIL PO) Take by mouth.     ONE TOUCH ULTRA TEST test strip USE 1 TO 2 TIMES A DAY  5   pantoprazole (PROTONIX) 40 MG tablet Take 1 tablet (40 mg total) by mouth daily. (Patient not taking: Reported on 03/13/2021) 90 tablet 3   pantoprazole (PROTONIX) 40 MG tablet Take by mouth.     prednisoLONE acetate (PRED FORTE) 1 % ophthalmic suspension Place 1 drop into the left eye 4 (four) times daily.     Probiotic Product (PROBIOTIC DAILY PO) Take by mouth.     triamcinolone cream (KENALOG) 0.1 % apply ON THE SKIN 2 TIMES DAILY AS NEEDED (Patient not taking: Reported on 03/13/2021)     Current Facility-Administered Medications on File Prior to Visit  Medication Dose Route Frequency  Provider Last Rate Last Admin   0.9 %  sodium chloride infusion  500 mL Intravenous Once Armbruster, Carlota Raspberry, MD        No Known Allergies Social History   Occupational History   Occupation: retired  Tobacco Use   Smoking status: Never   Smokeless tobacco: Never  Vaping Use   Vaping Use: Never used  Substance and Sexual Activity   Alcohol use: Yes    Comment: Rarely   Drug use:  No   Sexual activity: Not on file   Family History  Problem Relation Age of Onset   CAD Father    Lung cancer Father    Colon cancer Neg Hx    Esophageal cancer Neg Hx    Rectal cancer Neg Hx    Stomach cancer Neg Hx    Immunization History  Administered Date(s) Administered   Fluad Quad(high Dose 65+) 08/31/2019     Review of Systems: Negative except as noted in the HPI.   Objective: There were no vitals filed for this visit.  Gearold Wainer is a pleasant 80 y.o. male in NAD. AAO X 3.  Vascular Examination: CFT immediate b/l LE. Palpable DP/PT pulses b/l LE. Digital hair present b/l. Skin temperature gradient WNL b/l. No pain with calf compression b/l. No edema noted b/l. No cyanosis or clubbing noted b/l LE.  Dermatological Examination: Pedal skin is warm and supple b/l LE. No open wounds b/l LE. No interdigital macerations noted b/l LE. Toenails L 2nd toe, L 3rd toe, R hallux, R 2nd toe, and R 3rd toe elongated, discolored, dystrophic, thickened, and crumbly with subungual debris and tenderness to dorsal palpation. Hyperkeratotic lesion(s) L 2nd toe and R hallux.  No erythema, no edema, no drainage, no fluctuance.  Musculoskeletal Examination: Normal muscle strength 5/5 to all lower extremity muscle groups bilaterally. Hammertoe deformity noted 1-5 b/l.Marland Kitchen No pain, crepitus or joint limitation noted with ROM b/l LE.  Patient ambulates independently without assistive aids.  Footwear Assessment: Does the patient wear appropriate shoes? Yes. Does the patient need inserts/orthotics?  No.  Neurological Examination: Pt has subjective symptoms of neuropathy. Protective sensation intact 5/5 intact bilaterally with 10g monofilament b/l. Vibratory sensation intact b/l.  Assessment: 1. Pain due to onychomycosis of toenails of both feet   2. Corns and callosities   3. Hammertoe, bilateral   4. Diabetic peripheral neuropathy associated with type 2 diabetes mellitus ([place])   5. Encounter for diabetic foot exam ([place])     ADA Risk Categorization: High Risk  Patient has one or more of the following: Loss of protective sensation Absent pedal pulses Severe Foot deformity History of foot ulcer  Plan: -Diabetic foot examination performed today. -Continue foot and shoe inspections daily. Monitor blood glucose per PCP/Endocrinologist's recommendations. -Mycotic toenails L 2nd toe, L 3rd toe, R hallux, R 2nd toe, and R 3rd toe were debrided in length and girth with sterile nail nippers and dremel without iatrogenic bleeding. -Corn(s) L 2nd toe and callus(es) R hallux were pared utilizing sterile scalpel blade without incident. Total number debrided =2. -Patient/POA to call should there be question/concern in the interim.  Return in about 3 months (around 01/23/2022).  Marzetta Board, DPM

## 2021-12-29 ENCOUNTER — Encounter: Payer: Self-pay | Admitting: Cardiology

## 2021-12-29 ENCOUNTER — Other Ambulatory Visit: Payer: Self-pay

## 2021-12-29 ENCOUNTER — Ambulatory Visit: Payer: Federal, State, Local not specified - PPO | Admitting: Cardiology

## 2021-12-29 DIAGNOSIS — I2510 Atherosclerotic heart disease of native coronary artery without angina pectoris: Secondary | ICD-10-CM | POA: Diagnosis not present

## 2021-12-29 DIAGNOSIS — I4510 Unspecified right bundle-branch block: Secondary | ICD-10-CM | POA: Diagnosis not present

## 2021-12-29 DIAGNOSIS — I6523 Occlusion and stenosis of bilateral carotid arteries: Secondary | ICD-10-CM

## 2021-12-29 DIAGNOSIS — E7800 Pure hypercholesterolemia, unspecified: Secondary | ICD-10-CM | POA: Diagnosis not present

## 2021-12-29 DIAGNOSIS — I779 Disorder of arteries and arterioles, unspecified: Secondary | ICD-10-CM | POA: Insufficient documentation

## 2021-12-29 NOTE — Progress Notes (Signed)
Cardiology Office Note:    Date:  12/29/2021   ID:  Darin Mcclure, DOB 05/09/1941, MRN [id number]  PCP:  Chesley Noon, MD   [facility] Providers Cardiologist:  Candee Furbish, MD     Referring MD: Chesley Noon, MD    History of Present Illness:    Darin Mcclure is a 81 y.o. male here with the follow-up of coronary disease.  Has prior early generation Cypher drug-eluting stent placed in October 04, 2006 to the mid right coronary artery.  The ostium of the left main was normal by intravascular ultrasound.  Repeat cardiac catheterization 2009 showed normal coronary stent in right coronary artery and moderate disease involving the LAD and diagonal branches.  Has mild carotid plaque as well.  Left-sided chest discomfort at times at prior visit was concerning to him.  A stress test was performed on 01/12/2021 and was low risk with no ischemia.  Reassuring.  He states that especially in the evening hours he feels a sensation where he cannot get any deep breath all that well.  Does not sound cardiac.  We discussed.  Trying some deep breathing exercises may be helpful.  He has several questions about his medications.  He believes that the omega-3 is the reason his cholesterol is optimal.  His triglycerides are still in the 200 range.  Discussed with him that his diabetes control will contribute to that.  His hemoglobin A1c is 7.4.  His TSH was in the 3 range.  Excellent.  He states that in some of his reading it should be 0.  Discussed his stress test results.  Very pleased.  Lexiscan.  He was concerned because he thought that they had limited data from his stress test.  Reassurance.  Normal acquisition.  He worked for [employer] for 50 years.  Past Medical History:  Diagnosis Date   Allergy    Blood transfusion without reported diagnosis    Coronary artery disease    cath/stent 2008   Diabetic polyneuropathy associated with diabetes mellitus due to underlying condition  ([place]) 08/26/2017   GERD (gastroesophageal reflux disease)    Heart disease    Hyperglycemia    Hypertension    Mixed hyperlipidemia    Muscle cramp 08/26/2017   Neuropathy    Osteoarthritis    RBBB    RBBB (right bundle branch block)    Type 2 diabetes mellitus ([place])     Past Surgical History:  Procedure Laterality Date   ACHILLES TENDON REPAIR Right    BACK SURGERY     CORONARY ANGIOPLASTY     pror stent to mid RCA 10/04/2006   FOOT SURGERY     Cyst removal   INGUINAL HERNIA REPAIR     PERCUTANEOUS CORONARY STENT INTERVENTION (PCI-S)     REPLACEMENT TOTAL KNEE Left    TOTAL HIP ARTHROPLASTY Right    TOTAL HIP ARTHROPLASTY Left     Current Medications: Current Meds  Medication Sig   amoxicillin (AMOXIL) 500 MG capsule    aspirin EC 81 MG tablet Take 81 mg by mouth daily.   atorvastatin (LIPITOR) 10 MG tablet Take 10 mg by mouth daily.   azelastine (ASTELIN) 0.1 % nasal spray USE 2 SPRAYS IN EACH NOSTRIL 2 TIMES DAILY   carisoprodol (SOMA) 250 MG tablet Take by mouth.   clopidogrel (PLAVIX) 75 MG tablet Take 75 mg by mouth daily.   fluticasone (FLONASE) 50 MCG/ACT nasal spray Place 1 spray into both nostrils daily as needed.  JANUVIA 100 MG tablet Take 100 mg by mouth daily.   levothyroxine (SYNTHROID) 75 MCG tablet Take 75 mcg by mouth every other day.   losartan (COZAAR) 100 MG tablet Take 100 mg by mouth daily.   Magnesium 500 MG CAPS Take 1,000 mg by mouth.   meloxicam (MOBIC) 15 MG tablet Take 1 tablet (15 mg total) by mouth daily.   metFORMIN (GLUCOPHAGE) 1000 MG tablet TAKE 1 TABLET BY MOUTH 2 TIMES DAILY WITH MEALS   Methylcellulose, Laxative, (CITRUCEL PO) Take by mouth.   methylPREDNISolone (MEDROL DOSEPAK) 4 MG TBPK tablet 6 day dose pack - take as directed   metoprolol succinate (TOPROL-XL) 50 MG 24 hr tablet Take 1 tablet by mouth 2 (two) times daily.   Multiple Vitamin (MULTIVITAMIN) tablet Take 1 tablet by mouth daily.   Omega-3 Fatty Acids (OMEGA-3 FISH  OIL PO) Take by mouth.   ONE TOUCH ULTRA TEST test strip USE 1 TO 2 TIMES A DAY   pantoprazole (PROTONIX) 40 MG tablet Take 1 tablet (40 mg total) by mouth daily.   Probiotic Product (PROBIOTIC DAILY PO) Take by mouth.   triamcinolone cream (KENALOG) 0.1 %    Current Facility-Administered Medications for the 12/29/21 encounter (Office Visit) with Jerline Pain, MD  Medication   0.9 %  sodium chloride infusion     Allergies:   Patient has no known allergies.   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       [place] Home    Right handed    Social Determinants of Health   Financial Resource Strain: Not on file  Food Insecurity: Not on file  Transportation Needs: Not on file  Physical Activity: Not on file  Stress: Not on file  Social Connections: Not on file     Family History: The patient's family history includes CAD in his father; Lung cancer in his father. There is no history of Colon cancer, Esophageal cancer, Rectal cancer, or Stomach cancer.  ROS:   Please see the history of present illness.    No fevers chills nausea vomiting syncope bleeding all other systems reviewed and are negative.  EKGs/Labs/Other Studies Reviewed:    The following studies were reviewed today: Nuclear stress test 01/12/2021: The left ventricular ejection fraction is  hyperdynamic (>65%). Nuclear stress EF: 68%. No wall motion abnormality There was no ST segment deviation noted during stress. Defect 1: There is a small defect of mild severity present in the apex location. No ischemia identified. This is a low risk study.  Echocardiogram 01/12/2021:   1. Left ventricular ejection fraction, by estimation, is 60 to 65%. The  left ventricle has normal function. The left ventricle has no regional  wall  motion abnormalities. The left ventricular internal cavity size was  mildly dilated. Left ventricular  diastolic parameters are consistent with Grade II diastolic dysfunction  (pseudonormalization).   2. Right ventricular systolic function is normal. The right ventricular  size is normal. There is mildly elevated pulmonary artery systolic  pressure.   3. Left atrial size was mildly dilated.   4. The mitral valve is abnormal. Mild mitral valve regurgitation.  Moderate mitral annular calcification.   5. The aortic valve is abnormal. Aortic valve regurgitation is not  visualized. Mild aortic valve sclerosis is present, with no evidence of  aortic valve stenosis.   6. The inferior vena cava is normal in size with greater than 50%  respiratory variability, suggesting right atrial pressure of 3 mmHg.   Comparison(s): The left ventricular function is unchanged. Report only  02/28/16 EF 68%.   Carotid Dopplers 2020-mild disease bilaterally  EKG:  EKG is  ordered today.  The ekg ordered today demonstrates sinus bradycardia 56 with right bundle branch block.  Recent Labs: No results found for requested labs within last 8760 hours.  Recent Lipid Panel    Component Value Date/Time   CHOL 100 12/25/2017 0845   TRIG 166 (H) 12/25/2017 0845   HDL 36 (L) 12/25/2017 0845   CHOLHDL 2.8 12/25/2017 0845   LDLCALC 31 12/25/2017 0845     Risk Assessment/Calculations:              Physical Exam:    VS:  BP 132/70 (BP Location: Left Arm, Patient Position:  Sitting, Cuff Size: Normal)    Pulse (!) 56    Ht 6' (1.829 m)    Wt 247 lb (112 kg)    SpO2 97%    BMI 33.50 kg/m     Wt Readings from Last 3 Encounters:  12/29/21 247 lb (112 kg)  03/13/21 250 lb (113.4 kg)  12/27/20 247 lb (112 kg)     GEN:  Well nourished, well developed in no acute distress HEENT: Normal NECK: No JVD; No carotid bruits LYMPHATICS: No lymphadenopathy CARDIAC: RRR, no murmurs, no rubs, gallops RESPIRATORY:  Clear to auscultation without rales, wheezing or rhonchi  ABDOMEN: Soft, non-tender, non-distended MUSCULOSKELETAL:  No edema; No deformity  SKIN: Warm and dry NEUROLOGIC:  Alert and oriented x 3 PSYCHIATRIC:  Normal affect   ASSESSMENT:    1. Coronary artery disease involving native coronary artery of native heart without angina pectoris   2. Pure hypercholesterolemia   3. Right bundle branch block   4. Bilateral carotid artery stenosis    PLAN:    In order of problems listed above:  Coronary artery disease involving native coronary artery of native heart without angina pectoris RCA stent site for 2007.  Overall doing well.  Subsequent cardiac catheterization showed patent stent.  Continuing with Plavix secondary to early drug-eluting stent.  There is been some statistical evidence of late stent thrombosis in this category.  Stress test in 2022 was overall reassuring.  Excellent.  Echocardiogram also showed normal pump function  Pure hypercholesterolemia Atorvastatin 10 mg a day.  LDL is currently in the 50s, previously in the 30s.  Ideally we usually use high intensity statin however his LDL is quite low.  Also takes fish oil, omega-3.  No myalgias.  Triglycerides are in the 200 range.  Discussed that above 500 we worry about pancreatitis.  Continue to work on diabetes control to lower triglycerides.  Decrease sugars, carbs  Right bundle branch block Stable, no high  risk symptoms such as syncope.  Carotid artery disease (HCC) Mild bilaterally.  On  statin aspirin good blood pressure control.         Medication Adjustments/Labs and Tests Ordered: Current medicines are reviewed at length with the patient today.  Concerns regarding medicines are outlined above.  Orders Placed This Encounter  Procedures   EKG 12-Lead   No orders of the defined types were placed in this encounter.   Patient Instructions  Medication Instructions:  The current medical regimen is effective;  continue present plan and medications.  *If you need a refill on your cardiac medications before your next appointment, please call your pharmacy*  Follow-Up: At [facility], you and your health needs are our priority.  As part of our continuing mission to provide you with exceptional heart care, we have created designated Provider Care Teams.  These Care Teams include your primary Cardiologist (physician) and Advanced Practice Providers (APPs -  Physician Assistants and Nurse Practitioners) who all work together to provide you with the care you need, when you need it.  We recommend signing up for the patient portal called "MyChart".  Sign up information is provided on this After Visit Summary.  MyChart is used to connect with patients for Virtual Visits (Telemedicine).  Patients are able to view lab/test results, encounter notes, upcoming appointments, etc.  Non-urgent messages can be sent to your provider as well.   To learn more about what you can do with MyChart, go to [email or web address].    Your next appointment:   1 year(s)  The format for your next appointment:   In Person  Provider:   Dr Candee Furbish            If primary card or EP is not listed click here to update    :1}    Thank you for choosing [facility]!!      Signed, Candee Furbish, MD  12/29/2021 11:08 AM    [place]

## 2021-12-29 NOTE — Assessment & Plan Note (Addendum)
RCA stent site for 2007.  Overall doing well.  Subsequent cardiac catheterization showed patent stent.  Continuing with Plavix secondary to early drug-eluting stent.  There is been some statistical evidence of late stent thrombosis in this category.  Stress test in 2022 was overall reassuring.  Excellent.  Echocardiogram also showed normal pump function

## 2021-12-29 NOTE — Assessment & Plan Note (Signed)
Mild bilaterally.  On statin aspirin good blood pressure control.

## 2021-12-29 NOTE — Assessment & Plan Note (Addendum)
Atorvastatin 10 mg a day.  LDL is currently in the 50s, previously in the 30s.  Ideally we usually use high intensity statin however his LDL is quite low.  Also takes fish oil, omega-3.  No myalgias.  Triglycerides are in the 200 range.  Discussed that above 500 we worry about pancreatitis.  Continue to work on diabetes control to lower triglycerides.  Decrease sugars, carbs

## 2021-12-29 NOTE — Patient Instructions (Signed)
Medication Instructions:  ?The current medical regimen is effective;  continue present plan and medications. ? ?*If you need a refill on your cardiac medications before your next appointment, please call your pharmacy* ? ?Follow-Up: ?At [facility], you and your health needs are our priority.  As part of our continuing mission to provide you with exceptional heart care, we have created designated Provider Care Teams.  These Care Teams include your primary Cardiologist (physician) and Advanced Practice Providers (APPs -  Physician Assistants and Nurse Practitioners) who all work together to provide you with the care you need, when you need it. ? ?We recommend signing up for the patient portal called "MyChart".  Sign up information is provided on this After Visit Summary.  MyChart is used to connect with patients for Virtual Visits (Telemedicine).  Patients are able to view lab/test results, encounter notes, upcoming appointments, etc.  Non-urgent messages can be sent to your provider as well.   ?To learn more about what you can do with MyChart, go to [email or web address].   ? ?Your next appointment:   ?1 year(s) ? ?The format for your next appointment:   ?In Person ? ?Provider:   ?Dr Mark Skains ? ?If primary card or EP is not listed click here to update    :1}  ? ? ?Thank you for choosing  [facility]!! ? ? ? ?

## 2021-12-29 NOTE — Assessment & Plan Note (Signed)
Stable, no high risk symptoms such as syncope.

## 2022-02-02 ENCOUNTER — Other Ambulatory Visit: Payer: Self-pay

## 2022-02-02 ENCOUNTER — Ambulatory Visit: Payer: Federal, State, Local not specified - PPO | Admitting: Podiatry

## 2022-02-02 ENCOUNTER — Encounter: Payer: Self-pay | Admitting: Podiatry

## 2022-02-02 DIAGNOSIS — B351 Tinea unguium: Secondary | ICD-10-CM | POA: Diagnosis not present

## 2022-02-02 DIAGNOSIS — L84 Corns and callosities: Secondary | ICD-10-CM

## 2022-02-02 DIAGNOSIS — M79674 Pain in right toe(s): Secondary | ICD-10-CM

## 2022-02-02 DIAGNOSIS — E1142 Type 2 diabetes mellitus with diabetic polyneuropathy: Secondary | ICD-10-CM | POA: Diagnosis not present

## 2022-02-02 DIAGNOSIS — M79675 Pain in left toe(s): Secondary | ICD-10-CM

## 2022-02-05 ENCOUNTER — Ambulatory Visit: Payer: Federal, State, Local not specified - PPO | Admitting: Nurse Practitioner

## 2022-02-05 ENCOUNTER — Other Ambulatory Visit (INDEPENDENT_AMBULATORY_CARE_PROVIDER_SITE_OTHER): Payer: Federal, State, Local not specified - PPO

## 2022-02-05 ENCOUNTER — Encounter: Payer: Self-pay | Admitting: Nurse Practitioner

## 2022-02-05 VITALS — BP 136/62 | HR 57 | Ht 72.0 in | Wt 240.0 lb

## 2022-02-05 DIAGNOSIS — A048 Other specified bacterial intestinal infections: Secondary | ICD-10-CM

## 2022-02-05 DIAGNOSIS — R1084 Generalized abdominal pain: Secondary | ICD-10-CM

## 2022-02-05 DIAGNOSIS — R197 Diarrhea, unspecified: Secondary | ICD-10-CM

## 2022-02-05 DIAGNOSIS — R194 Change in bowel habit: Secondary | ICD-10-CM

## 2022-02-05 DIAGNOSIS — R142 Eructation: Secondary | ICD-10-CM

## 2022-02-05 LAB — CBC WITH DIFFERENTIAL/PLATELET
Basophils Absolute: 0 10*3/uL (ref 0.0–0.1)
Basophils Relative: 0.7 % (ref 0.0–3.0)
Eosinophils Absolute: 0.1 10*3/uL (ref 0.0–0.7)
Eosinophils Relative: 1.5 % (ref 0.0–5.0)
HCT: 41.7 % (ref 39.0–52.0)
Hemoglobin: 14.2 g/dL (ref 13.0–17.0)
Lymphocytes Relative: 28.8 % (ref 12.0–46.0)
Lymphs Abs: 1.6 10*3/uL (ref 0.7–4.0)
MCHC: 34 g/dL (ref 30.0–36.0)
MCV: 91 fl (ref 78.0–100.0)
Monocytes Absolute: 0.4 10*3/uL (ref 0.1–1.0)
Monocytes Relative: 7.5 % (ref 3.0–12.0)
Neutro Abs: 3.4 10*3/uL (ref 1.4–7.7)
Neutrophils Relative %: 61.5 % (ref 43.0–77.0)
Platelets: 178 10*3/uL (ref 150.0–400.0)
RBC: 4.59 Mil/uL (ref 4.22–5.81)
RDW: 13.1 % (ref 11.5–15.5)
WBC: 5.5 10*3/uL (ref 4.0–10.5)

## 2022-02-05 LAB — COMPREHENSIVE METABOLIC PANEL
ALT: 34 U/L (ref 0–53)
AST: 28 U/L (ref 0–37)
Albumin: 4.7 g/dL (ref 3.5–5.2)
Alkaline Phosphatase: 42 U/L (ref 39–117)
BUN: 19 mg/dL (ref 6–23)
CO2: 28 mEq/L (ref 19–32)
Calcium: 9.7 mg/dL (ref 8.4–10.5)
Chloride: 102 mEq/L (ref 96–112)
Creatinine, Ser: 1.44 mg/dL (ref 0.40–1.50)
GFR: 46.28 mL/min — ABNORMAL LOW (ref >60.00)
Glucose, Bld: 121 mg/dL — ABNORMAL HIGH (ref 70–99)
Potassium: 4.2 mEq/L (ref 3.5–5.1)
Sodium: 139 mEq/L (ref 135–145)
Total Bilirubin: 0.9 mg/dL (ref 0.2–1.2)
Total Protein: 7.3 g/dL (ref 6.0–8.3)

## 2022-02-05 LAB — C-REACTIVE PROTEIN: CRP: <1 mg/dL (ref 0.5–20.0)

## 2022-02-05 MED ORDER — FAMOTIDINE 20 MG PO TABS: 20.0000 mg | ORAL_TABLET | Freq: Every day | ORAL | 1 refills | Status: DC

## 2022-02-05 NOTE — Patient Instructions (Signed)
RECOMMENDATIONS: ? ?Florastor Probiotic 1 capsule twice a day for 2-4 weeks. This is over the counter. ?Use Lactaid 1-2 tablets with each dairy product. ?Push fluids, eat a bland diet. ?We have sent the following medication to your pharmacy for you to pick up at your convenience: ?Famotidine 20 MG tablet once daily. If insurance does not cover this then purchase it over the counter. ? ?Further recommendations to be determined after all tests results reviewed. ?Please proceed to the basement level for lab work and stool test  before leaving today. Press "B" on the elevator. The lab is located at the first door on the left as you exit the elevator. ? ?Thank you for trusting me with your gastrointestinal care!   ? ?[name], CRNP ? ? ? ?BMI: ? ?If you are age 68 or older, your body mass index should be between 23-30. Your Body mass index is 32.55 kg/m?Marland Kitchen If this is out of the aforementioned range listed, please consider follow up with your Primary Care Provider. ? ?If you are age 100 or younger, your body mass index should be between 19-25. Your Body mass index is 32.55 kg/m?Marland Kitchen If this is out of the aformentioned range listed, please consider follow up with your Primary Care Provider.  ? ?MY CHART: ? ?The [place] [facility] providers would like to encourage you to use [facility] to communicate with providers for non-urgent requests or questions.  Due to long hold times on the telephone, sending your provider a message by [facility] may be a faster and more efficient way to get a response.  Please allow 48 business hours for a response.  Please remember that this is for non-urgent requests.  ? ? ? ? ?

## 2022-02-05 NOTE — Progress Notes (Signed)
Agree with assessment and plan as outlined.  

## 2022-02-05 NOTE — Progress Notes (Signed)
02/05/2022 Darin Mcclure [id number] 09/26/1941   Chief Complaint: Change in bowel pattern, urgent stools after eating  History of Present Illness: Darin Mcclure is a 81 year old male with a past medical history of hypertension, hyperlipidemia, coronary artery disease s/p DES to the RCA 09/2006 on Plavix. S/P stress test 01/12/2021 which was low risk with no evidence of ischemia.  Echo 01/12/2021 showed LV EF 60 to 65% with grade 2 diastolic dysfunction and normal RV function. mild carotid artery disease, diabetes mellitus type 2, hypothyroidism, vitamin D deficiency, dysphagia, GERD and colon polyps.  He is followed by Dr. Havery Moros.  He presents to our office today for further evaluation regarding a change in his bowel pattern.  Approximately 5 weeks ago, he underwent his annual physical exam and he felt well at that time.  Around 12/29/2021, his stomach started to make gurgling noises, he noticed increased belching and his bowel pattern became irregular.  He previously passed a normal formed brown bowel movement daily.  He skipped a few days without passing a bowel movement then started passing soft piles of brown stool which progressed to gushes of nonbloody water with intermittent soft stool 3-4 times daily triggered by eating any food.  No specific food triggers.  Yesterday he passed a soft formed stool with a "gush" of water x3-4 episodes.  No floating stools or oily discharge noted in the toilet water.  He last took an antibiotic (Amoxicillin) prior to a dental procedure 10/2021.  No new medications or supplements.  He is on Metformin 1000 mg twice daily, no recent dose change.  No dietary changes.  No coffee.  He drinks tea.  He uses lactose-free milk and eats cheese 2 to 3 days weekly.  His stress level is not high.  No nausea or vomiting.  No heartburn, dysphagia or LUQ pain.  He has intermittent central abdominal and less frequently has central lower abdominal pain which has not been severe.   No fevers.  He has lost 10 pounds over the past 4 weeks.  He is tolerating peanut butter and toast with tea most days.  A few months ago he was having "irritation to my stomach" and started taking Pantoprazole 40 mg daily for about 2 months.  He stopped taking Pantoprazole about 4 weeks ago.  He denies taking any antidiarrheal/Imodium.  He is taking a bacteria probiotic daily.  He underwent an EGD and colonoscopy 05/13/2019.  The EGD identified a benign-appearing esophageal stenosis which was dilated, reflux esophagitis and gastritis.  No evidence of H. pylori.  The colonoscopy identified for tubular adenomatous polyps removed from the transverse, sigmoid and cecum.  No known family history of celiac disease or IBD.   Labs 12/27/2021: TSH 3.470.  WBC 6.1.  Hemoglobin 14.2.  Hematocrit 41.8.  Platelet 149.  Glucose 177.  BUN 18.  Creatinine 1.46 (0.76 - 1.27).  Sodium 144.  Potassium 5.0.  Total bili 0.8.  Alk phos 49.  AST 30.  ALT 34.   EGD 05/13/2019: - Esophagogastric landmarks identified. - LA Grade B reflux esophagitis. - Benign-appearing esophageal stenosis at the GEJ. Dilated to 68m. Biopsied. - Benign proximal esophageal gastric inlet patch - Gastritis. Biopsied. - Normal duodenal bulb and second portion of the duodenum.  Colonoscopy 05/13/2019: - One 3 mm polyp in the cecum, removed with a cold snare. Resected and retrieved. - Two 3 to 4 mm polyps in the transverse colon, removed with a cold snare. Resected and Retrieved - One 3 mm  polyp in the sigmoid colon, removed with a cold snare. Resected and retrieved. - Diverticulosis in the transverse colon and in the left colon. - Internal hemorrhoids. - The examination was otherwise normal. -Consider recall colonoscopy in 3 years, patient will be 80 at that time  1. Surgical [P], gastric antrum and body - MILD REACTIVE CHANGES. - WARTHIN-STARRY IS NEGATIVE FOR HELICOBACTER PYLORI. - NO INTESTINAL METAPLASIA, DYSPLASIA, OR MALIGNANCY. 2.  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 - NO INTESTINAL METAPLASIA, DYSPLASIA, OR MALIGNANCY IDENTIFIED. 3. Surgical [P], transverse, sigmoid and cecum, polyp (4) - TUBULAR ADENOMA. - NO HIGH GRADE DYSPLASIA OR MALIGNANCY.  Current Outpatient Medications on File Prior to Visit  Medication Sig Dispense Refill   amoxicillin (AMOXIL) 500 MG capsule As needed for dental procedure     aspirin EC 81 MG tablet Take 81 mg by mouth daily.     atorvastatin (LIPITOR) 10 MG tablet Take 10 mg by mouth daily.  2   carisoprodol (SOMA) 250 MG tablet Take by mouth as needed.     clopidogrel (PLAVIX) 75 MG tablet Take 75 mg by mouth daily.  2   EFUDEX 5 % cream Apply topically daily. For 2 weeks     fluticasone (FLONASE) 50 MCG/ACT nasal spray Place 1 spray into both nostrils daily as needed.   3   JANUVIA 100 MG tablet Take 100 mg by mouth daily.     levothyroxine (SYNTHROID) 75 MCG tablet Take 75 mcg by mouth daily.     losartan (COZAAR) 100 MG tablet Take 100 mg by mouth daily.  2   Magnesium 500 MG CAPS Take 1,000 mg by mouth.     meloxicam (MOBIC) 15 MG tablet Take 1 tablet (15 mg total) by mouth daily. (Patient taking differently: Take 15 mg by mouth as needed.) 60 tablet 1   metFORMIN (GLUCOPHAGE) 1000 MG tablet TAKE 1 TABLET BY MOUTH 2 TIMES DAILY WITH MEALS  2   metoprolol succinate (TOPROL-XL) 50 MG 24 hr tablet Take 1 tablet by mouth 2 (two) times daily.     Multiple Vitamin (MULTIVITAMIN) tablet Take 1 tablet by mouth daily.     Omega-3 Fatty Acids (OMEGA-3 FISH OIL PO) Take by mouth.     ONE TOUCH ULTRA TEST test strip USE 1 TO 2 TIMES A DAY  5   Probiotic Product (PROBIOTIC DAILY PO) Take by mouth.     Methylcellulose, Laxative, (CITRUCEL PO) Take by mouth. (Patient not taking: Reported on 02/05/2022)     pantoprazole (PROTONIX) 40 MG tablet Take 1 tablet (40 mg total) by mouth daily. (Patient not taking: Reported on 02/05/2022) 90 tablet 3   No current facility-administered  medications on file prior to visit.   No Known Allergies   Current Medications, Allergies, Past Medical History, Past Surgical History, Family History and Social History were reviewed in [employer] record.  Review of Systems:   Constitutional: Negative for fever, sweats, chills or weight loss.  Respiratory: Negative for shortness of breath.   Cardiovascular: Negative for chest pain, palpitations and leg swelling.  Gastrointestinal: See HPI.  Musculoskeletal: Negative for back pain or muscle aches.  Neurological: Negative for dizziness, headaches or paresthesias.   Physical Exam: BP 136/62    Pulse (!) 57    Ht 6' (1.829 m)    Wt 240 lb (108.9 kg)    SpO2 96%    BMI 32.55 kg/m   General: 81 year old male in no acute distress Head: Normocephalic and  atraumatic. Eyes: No scleral icterus. Conjunctiva pink . Ears: Normal auditory acuity. Mouth: Dentition intact. No ulcers or lesions.  Lungs: Clear throughout to auscultation. Heart: Regular rate and rhythm, no murmur. Abdomen: Soft, nontender and nondistended. No masses or hepatomegaly. Normal bowel sounds x 4 quadrants.  Rectal: Deferred.  Musculoskeletal: Symmetrical with no gross deformities. Extremities: No edema. Neurological: Alert oriented x 4. No focal deficits.  Psychological: Alert and cooperative. Normal mood and affect  Assessment and Recommendations:  72) 81 year old male with a change in bowel pattern, passing urgent soft to watery nonbloody bowel movements shortly after eating any food 3-4 times with intermittent generalized abdominal pain, less frequent lower abdominal pain x 4 to 5 weeks. No severe abdominal pain.  10 pound weight loss. -GI pathogen panel -Fecal calprotectin level -CRP, TTG, IgA, CBC and CMP -Florastor probiotic 1 p.o. twice daily for 2 to 4 weeks -If the above stool studies are negative, to consider breath test to rule out small intestinal bacterial overgrowth -Consider  changing metformin to extended release, defer to PCP -I discussed scheduling a diagnostic colonoscopy if the above lab results are unrevealing and symptoms persist or worsen -CTAP with contrast if generalized or lower abdominal pain persists/worsens -Lactaid 1-2 tabs with each dairy product -Push fluids, bland diet  2) History of 4 tubular adenomatous colon per colonoscopy 04/2019. -Defer colon polyp surveillance colonoscopy recommendations per Dr. Havery Moros  3) Coronary artery disease.  On Plavix and ASA 81 mg daily.  4) History of GERD, benign stenosis at the GE junction was dilated at the time of his EGD 04/2019. Increased burping -Famotidine 20 mg daily  5) Laboratory studies 12/27/2021 showed a elevated creatinine level - Bun/Cr as ordered above

## 2022-02-06 LAB — TISSUE TRANSGLUTAMINASE ABS,IGG,IGA
(tTG) Ab, IgA: <1 U/mL
(tTG) Ab, IgG: <1 U/mL

## 2022-02-06 LAB — IGA: Immunoglobulin A: 192 mg/dL (ref 70–320)

## 2022-02-09 LAB — GI PROFILE, STOOL, PCR

## 2022-02-09 LAB — CALPROTECTIN, FECAL: Calprotectin, Fecal: <16 ug/g (ref 0–120)

## 2022-02-10 NOTE — Progress Notes (Signed)
?  Subjective:  ?Patient ID: Darin Mcclure, male    DOB: 10/08/1941,  MRN: [id number] ? ?Darin Mcclure presents to clinic today for at risk foot care with history of diabetic neuropathy and corn(s) left foot, callus(es) right footand painful mycotic nails.  Pain interferes with ambulation. Aggravating factors include wearing enclosed shoe gear. Painful toenails interfere with ambulation. Aggravating factors include wearing enclosed shoe gear. Pain is relieved with periodic professional debridement. Painful corns and calluses are aggravated when weightbearing with and without shoegear. Pain is relieved with periodic professional debridement. ? ?Patient did not check blood glucose today. ? ?New problem(s): None.  ? ?PCP is Chesley Noon, MD , and last visit was January 01, 2022. ? ?No Known Allergies ? ?Review of Systems: Negative except as noted in the HPI. ? ?Objective: No changes noted in today's physical examination. ?Darin Mcclure is a pleasant 81 y.o. male in NAD. AAO X 3. ? ?Vascular Examination: ?CFT immediate b/l LE. Palpable DP/PT pulses b/l LE. Digital hair present b/l. Skin temperature gradient WNL b/l. No pain with calf compression b/l. No edema noted b/l. No cyanosis or clubbing noted b/l LE. ? ?Dermatological Examination: ?Pedal skin is warm and supple b/l LE. No open wounds b/l LE. No interdigital macerations noted b/l LE. Toenails L 2nd toe, L 3rd toe, R hallux, R 2nd toe, and R 3rd toe elongated, discolored, dystrophic, thickened, and crumbly with subungual debris and tenderness to dorsal palpation. Hyperkeratotic lesion(s) L 2nd toe and R hallux.  No erythema, no edema, no drainage, no fluctuance. ? ?Musculoskeletal Examination: ?Normal muscle strength 5/5 to all lower extremity muscle groups bilaterally. Hammertoe deformity noted 1-5 b/l.Marland Kitchen No pain, crepitus or joint limitation noted with ROM b/l LE.  Patient ambulates independently without assistive aids. ? ?Neurological Examination: ?Pt has  subjective symptoms of neuropathy. Protective sensation intact 5/5 intact bilaterally with 10g monofilament b/l. Vibratory sensation intact b/l. ? ?Assessment/Plan: ?1. Diabetic peripheral neuropathy associated with type 2 diabetes mellitus ([place])   ?2. Pain due to onychomycosis of toenails of both feet   ?3. Corns and callosities   ?-Patient was evaluated and treated. All patient's and/or POA's questions/concerns answered on today's visit. ?-Toenails L 2nd toe, L 3rd toe, R hallux, R 2nd toe, and R 3rd toe debrided in length and girth without iatrogenic bleeding with sterile nail nipper and dremel.  ?-Corn(s) left second digit and callus(es) R hallux were pared utilizing sterile scalpel blade without incident. Total number debrided =2. ?-Patient/POA to call should there be question/concern in the interim.  ? ?Return in about 3 months (around 05/05/2022). ? ?Marzetta Board, DPM  ?

## 2022-03-14 ENCOUNTER — Ambulatory Visit: Payer: Federal, State, Local not specified - PPO | Admitting: Neurology

## 2022-04-12 ENCOUNTER — Telehealth: Payer: Self-pay | Admitting: Nurse Practitioner

## 2022-04-12 NOTE — Telephone Encounter (Signed)
Darin Mcclure, I received a note from the patient 03/26/2022 regarding a GI pathogen panel study which was not covered by his insurance. This letter can  be found under media photo.   Please submit a copy of patient's last office visit 02/05/2022 and a copy of the GI pathogen panel study which showed + for Norovirus.   Pls include a memo with additional codes: ICD code A09 for infectious diarrhea and A08.1 for Norovirus infection.   Please inform Mr. Fetch, went this info has been submitted ( fax # is on the letter) and have him contact me if he continues to having billing issues regarding the GI pathogen panel and fecal calprotectin test.   Corrin Parker

## 2022-04-12 NOTE — Telephone Encounter (Signed)
Chart Review Routing History  Recipients Sent On Sent By Routed Reports   FEP Customer Service   04/12/2022  3:46 PM Aleatha Borer, LPN GI PROFILE, STOOL, PCR ([phone])      Cover Page Message : Franchot Erichsen #[id number]       FEP Customer Service   04/12/2022  3:46 PM Aleatha Borer, LPN Office Visit on 0/21/1173 with [name], NP      Cover Page Message : Franchot Erichsen #[phone]       Called pt and informed the above information has been submitted to his insurance. Verbalized acceptance and understanding.

## 2022-04-30 ENCOUNTER — Encounter: Payer: Self-pay | Admitting: Podiatry

## 2022-04-30 ENCOUNTER — Ambulatory Visit: Payer: Federal, State, Local not specified - PPO | Admitting: Podiatry

## 2022-04-30 DIAGNOSIS — M79675 Pain in left toe(s): Secondary | ICD-10-CM | POA: Diagnosis not present

## 2022-04-30 DIAGNOSIS — B351 Tinea unguium: Secondary | ICD-10-CM

## 2022-04-30 DIAGNOSIS — M79674 Pain in right toe(s): Secondary | ICD-10-CM

## 2022-04-30 DIAGNOSIS — E1142 Type 2 diabetes mellitus with diabetic polyneuropathy: Secondary | ICD-10-CM

## 2022-05-05 NOTE — Progress Notes (Signed)
  Subjective:  Patient ID: Darin Mcclure, male    DOB: 05/31/1941,  MRN: [id number]  Darin Mcclure presents to clinic today for at risk foot care with history of diabetic neuropathy and corn(s) left foot, callus(es) right lower extremity and painful mycotic nails.  Pain interferes with ambulation. Aggravating factors include wearing enclosed shoe gear. Painful toenails interfere with ambulation. Aggravating factors include wearing enclosed shoe gear. Pain is relieved with periodic professional debridement. Painful corns and calluses are aggravated when weightbearing with and without shoegear. Pain is relieved with periodic professional debridement.  Last known HgA1c was 6.5%. Patient did not check blood glucose today.  New problem(s): None.   PCP is Chesley Noon, MD , and last visit was May 02, 2022.  No Known Allergies  Review of Systems: Negative except as noted in the HPI.  Objective: No changes noted in today's physical examination. Darin Mcclure is a pleasant 81 y.o. male in NAD. AAO X 3.  Vascular Examination: CFT immediate b/l LE. Palpable DP/PT pulses b/l LE. Digital hair present b/l. Skin temperature gradient WNL b/l. No pain with calf compression b/l. No edema noted b/l. No cyanosis or clubbing noted b/l LE.  Dermatological Examination: Pedal skin is warm and supple b/l LE. No open wounds b/l LE. No interdigital macerations noted b/l LE. Toenails L 2nd toe, L 3rd toe, R hallux, R 2nd toe, and R 3rd toe elongated, discolored, dystrophic, thickened, and crumbly with subungual debris and tenderness to dorsal palpation. No hyperkeratotic nor porokeratotic lesions present on today's visit.   Musculoskeletal Examination: Normal muscle strength 5/5 to all lower extremity muscle groups bilaterally. Hammertoe deformity noted 1-5 b/l.Marland Kitchen No pain, crepitus or joint limitation noted with ROM b/l LE.  Patient ambulates independently without assistive aids.  Neurological Examination: Pt  has subjective symptoms of neuropathy. Protective sensation intact 5/5 intact bilaterally with 10g monofilament b/l. Vibratory sensation intact b/l.  Assessment/Plan: 1. Pain due to onychomycosis of toenails of both feet   2. Diabetic peripheral neuropathy associated with type 2 diabetes mellitus ([place])     -Examined patient. -Patient to continue soft, supportive shoe gear daily. -Mycotic toenails bilateral 2nd toes, bilateral 3rd toes, and right great toe were debrided in length and girth with sterile nail nippers and dremel without iatrogenic bleeding. -Nondystrophic toenails trimmed bilateral 4th toes, bilateral 5th toes, and L hallux. -Patient/POA to call should there be question/concern in the interim.   Return in about 3 months (around 07/31/2022).  Marzetta Board, DPM

## 2022-07-20 ENCOUNTER — Other Ambulatory Visit: Payer: Self-pay | Admitting: Family Medicine

## 2022-07-20 DIAGNOSIS — R29898 Other symptoms and signs involving the musculoskeletal system: Secondary | ICD-10-CM

## 2022-07-20 DIAGNOSIS — G5793 Unspecified mononeuropathy of bilateral lower limbs: Secondary | ICD-10-CM

## 2022-07-25 ENCOUNTER — Ambulatory Visit
Admission: RE | Admit: 2022-07-25 | Discharge: 2022-07-25 | Disposition: A | Discharge status: Home / Self Care | Payer: Federal, State, Local not specified - PPO | Source: Ambulatory Visit | Attending: Family Medicine | Admitting: Family Medicine

## 2022-07-25 DIAGNOSIS — G5793 Unspecified mononeuropathy of bilateral lower limbs: Secondary | ICD-10-CM

## 2022-07-25 DIAGNOSIS — R29898 Other symptoms and signs involving the musculoskeletal system: Secondary | ICD-10-CM

## 2022-08-07 ENCOUNTER — Ambulatory Visit: Payer: Federal, State, Local not specified - PPO | Admitting: Podiatry

## 2022-08-07 ENCOUNTER — Encounter: Payer: Self-pay | Admitting: Podiatry

## 2022-08-07 DIAGNOSIS — M79674 Pain in right toe(s): Secondary | ICD-10-CM

## 2022-08-07 DIAGNOSIS — L84 Corns and callosities: Secondary | ICD-10-CM

## 2022-08-07 DIAGNOSIS — M79675 Pain in left toe(s): Secondary | ICD-10-CM

## 2022-08-07 DIAGNOSIS — B351 Tinea unguium: Secondary | ICD-10-CM

## 2022-08-07 DIAGNOSIS — E1142 Type 2 diabetes mellitus with diabetic polyneuropathy: Secondary | ICD-10-CM | POA: Diagnosis not present

## 2022-08-10 NOTE — Progress Notes (Signed)
  Subjective:  Patient ID: Darin Mcclure, male    DOB: 01-09-1941,  MRN: [id number]  81 y.o. male presents at risk foot care with history of diabetic neuropathy and callus(es) right lower extremity and painful thick toenails that are difficult to trim. Painful toenails interfere with ambulation. Aggravating factors include wearing enclosed shoe gear. Pain is relieved with periodic professional debridement. Painful calluses are aggravated when weightbearing with and without shoegear. Pain is relieved with periodic professional debridement.  Last known  HgA1c was 6.7%.  Patient did not check blood glucose this morning.  New problem(s): None   PCP is Chesley Noon, MD , and last visit was July 09, 2022  No Known Allergies  Review of Systems: Negative except as noted in the HPI.   Objective:  Darin Mcclure is a pleasant 81 y.o. male, WD, WN in NAD. AAO x 3.  Vascular Examination: Vascular status intact b/l with palpable pedal pulses. CFT immediate b/l. No edema. No pain with calf compression b/l. Skin temperature gradient WNL b/l.   Neurological Examination: Sensation grossly intact b/l with 10 gram monofilament. Vibratory sensation intact b/l. Pt has subjective symptoms of neuropathy.  Dermatological Examination: Pedal skin with normal turgor, texture and tone b/l.  No hyperkeratotic lesions noted b/l. Toenails L 2nd toe, L 3rd toe, R hallux, R 2nd toe, and R 3rd toe elongated, mildly discolored, dystrophic, thickened, and crumbly with subungual debris and tenderness to dorsal palpation.  Musculoskeletal Examination: Muscle strength 5/5 to b/l LE. Hammertoe deformity noted 1-5 b/l. Patient ambulates independent of any assistive aids.  Radiographs: None Assessment:   1. Diabetic peripheral neuropathy associated with type 2 diabetes mellitus ([facility])   2. Pain due to onychomycosis of toenails of both feet   3. Callus    Plan:  -Patient was evaluated and treated. All patient's  and/or POA's questions/concerns answered on today's visit. -Toenails L 2nd toe, L 3rd toe, R hallux, R 2nd toe, and R 3rd toe debrided in length and girth without iatrogenic bleeding with sterile nail nipper and dremel.  -Nondystrophic toenails trimmed x 5. -Callus(es) R hallux pared utilizing sterile scalpel blade without complication or incident. Total number debrided =1. -Patient/POA to call should there be question/concern in the interim.  Return in about 3 months (around 11/06/2022).  Marzetta Board, DPM

## 2022-10-26 NOTE — Progress Notes (Deleted)
Follow-up Visit   Date: 10/26/22    Nicholos Aloisi MRN: [id number] DOB: December 06, 1940   Interim History: Darin Mcclure is a 81 y.o. right-handed Caucasian male with hypertensin, hyperlipidemia, diabetes mellitus, CAD s/p PCI with stent, and GERD returning to the clinic for follow-up of neuropathy.    History of present illness: He started having numbness of toes, which had progressed over the years to involve his foot and lower leg.  He was diagnosed with neuropathy and seen at [facility] and many years later, was diagnosed with diabetes mellitus.  He complains that his left foot feels heavy, especially when he wakes up.  He has very brief spells of pulsating sharp pain, lasting 30-min to several hours which can affect both legs.  He saw Dr. Yehuda Savannah, [place]. while living in [place] and moved here in July 2018.  He had NCS/EMG which confirmed neuropathy.    He also complained of being forgetful at times, such as walking into the room and forgetting the reason for being there.  He is able to maintain all instrumental ADLs such as driving, managing finances, and household chores.  UPDATE 03/13/2021:  He is here for 1 year follow-up.  He has noticed increased difficulty with remembering names and experiencing more word-finding difficulty. He does not have other memory issues, such as increased forgetfullness or misplacing things. He is able to manage medications, finances, and household duties.  No issues with driving or navigating.  His neuropathy seems to be stable with numbness in the feet and lower legs. He has rare spells of recurrent sharp pain in the feet, lasting about 15 seconds.  He has many questions about his neuropathy.  He has a hemtoma over the left medial leg which occurred after hitting his leg onto his trailer.   UPDATE 10/26/2022:  He is here for follow-up visit. ***  Medications:  Current Outpatient Medications on File Prior to Visit  Medication Sig Dispense Refill    amoxicillin (AMOXIL) 500 MG capsule As needed for dental procedure     aspirin EC 81 MG tablet Take 81 mg by mouth daily.     atorvastatin (LIPITOR) 10 MG tablet Take 10 mg by mouth daily.  2   carisoprodol (SOMA) 250 MG tablet Take by mouth as needed.     clopidogrel (PLAVIX) 75 MG tablet Take 1 tablet by mouth daily.     EFUDEX 5 % cream Apply topically daily. For 2 weeks     famotidine (PEPCID) 20 MG tablet Take 1 tablet (20 mg total) by mouth daily. 30 tablet 1   fluticasone (FLONASE) 50 MCG/ACT nasal spray Place 1 spray into both nostrils daily as needed.   3   fluticasone (FLONASE) 50 MCG/ACT nasal spray Place 2 sprays into both nostrils daily.     JANUVIA 100 MG tablet Take 100 mg by mouth daily.     levothyroxine (SYNTHROID) 75 MCG tablet Take 75 mcg by mouth daily.     losartan (COZAAR) 100 MG tablet Take 1 tablet by mouth daily.     Magnesium 500 MG CAPS Take 1,000 mg by mouth.     meloxicam (MOBIC) 15 MG tablet Take 1 tablet (15 mg total) by mouth daily. (Patient taking differently: Take 15 mg by mouth as needed.) 60 tablet 1   metFORMIN (GLUCOPHAGE) 1000 MG tablet Take 1 tablet by mouth 2 (two) times daily.     Methylcellulose, Laxative, (CITRUCEL PO) Take by mouth. (Patient not taking: Reported on 02/05/2022)  metoprolol succinate (TOPROL-XL) 50 MG 24 hr tablet Take 1 tablet by mouth 2 (two) times daily.     metroNIDAZOLE (METROGEL) 1 % gel Apply topically daily.     Multiple Vitamin (MULTIVITAMIN) tablet Take 1 tablet by mouth daily.     Omega-3 Fatty Acids (OMEGA-3 FISH OIL PO) Take by mouth.     ONE TOUCH ULTRA TEST test strip USE 1 TO 2 TIMES A DAY  5   pantoprazole (PROTONIX) 40 MG tablet Take 1 tablet by mouth daily.     Probiotic Product (PROBIOTIC DAILY PO) Take by mouth.     triamcinolone cream (KENALOG) 0.1 % apply ON THE SKIN 2 TIMES DAILY AS NEEDED     No current facility-administered medications on file prior to visit.    Allergies: No Known Allergies  Vital  Signs:  There were no vitals taken for this visit.  Neurological Exam: MENTAL STATUS including orientation to time, place, person, recent and remote memory, attention span and concentration, language, and fund of knowledge is normal.  Speech is not dysarthric.  CRANIAL NERVES:    Extraocular muscles intact. Face is symmetric.  MOTOR:  Motor strength is 5/5 in all extremities, including distally. No pronator drift.  Tone is normal.  Moderately sized healing hematoma over the left upper medial leg.   SENSORY:  Absent vibration at the ankles; diminished pin prick and temperature distal to ankles bilaterally.  Rhomberg sign is very mildly positive.  REFLEXES:  Reflexes are 2+/4 throughout.   COORDINATION/GAIT:   Gait narrow based and stable, mildly wide-based. Tandem and stressed gait intact.  Unassisted  Data: Labs 08/03/2016:  Folate >24, TSH 2.37, Lyme neg, RF neg, CCP neg, HbA1c 6.3, ANA neg, vitamin B12 309 Labs 08/26/2017:  Vitamin B12 298, copper 78, MMA 165, SPEP with IFE no M protein Lab Results  Component Value Date   VITAMINB12 391 12/17/2018    TCD 08/03/2016:  Normal EEG 08/03/2016:  Normal  Cognitive testing 08/03/2016:  Normal NCS/EMG of the legs 08/03/2016:  Diffuse sensorimotor polyneuropathy US carotids 12/15/2018:  1-39% bilateral ICA stenosis   Lab Results  Component Value Date   HGBA1C 6.9 (H) 12/17/2018     IMPRESSION/PLAN: 1.  Diabetic neuropathy manifesting with numbness in the feet and lower legs.  He reports occasional pain in the feet, not frequent enough to start medication. Diabetes is relatively well-controlled with last Hb1c 7.0  He had many questions which I addressed to the best of my ability  Continue supportive care  2.  Mild cognitive impairment with word-finding difficulty.  He is highly independent, able to perform all ADLs and IADLs.  No signs of dementia  - Reassurance provided  - If symptoms progression, neurocognitive testing can be ordered.     Thank you for allowing me to participate in patient's care.  If I can answer any additional questions, I would be pleased to do so.    Sincerely,    Mozetta Murfin K. Posey Pronto, DO

## 2022-10-29 ENCOUNTER — Ambulatory Visit: Payer: Federal, State, Local not specified - PPO | Admitting: Neurology

## 2022-11-07 ENCOUNTER — Ambulatory Visit (INDEPENDENT_AMBULATORY_CARE_PROVIDER_SITE_OTHER): Payer: Federal, State, Local not specified - PPO | Admitting: Podiatry

## 2022-11-07 DIAGNOSIS — M79674 Pain in right toe(s): Secondary | ICD-10-CM

## 2022-11-07 DIAGNOSIS — M2042 Other hammer toe(s) (acquired), left foot: Secondary | ICD-10-CM

## 2022-11-07 DIAGNOSIS — M2041 Other hammer toe(s) (acquired), right foot: Secondary | ICD-10-CM | POA: Diagnosis not present

## 2022-11-07 DIAGNOSIS — Z0189 Encounter for other specified special examinations: Secondary | ICD-10-CM

## 2022-11-07 DIAGNOSIS — L84 Corns and callosities: Secondary | ICD-10-CM | POA: Diagnosis not present

## 2022-11-07 DIAGNOSIS — E119 Type 2 diabetes mellitus without complications: Secondary | ICD-10-CM

## 2022-11-07 DIAGNOSIS — B351 Tinea unguium: Secondary | ICD-10-CM

## 2022-11-07 DIAGNOSIS — E1142 Type 2 diabetes mellitus with diabetic polyneuropathy: Secondary | ICD-10-CM

## 2022-11-07 DIAGNOSIS — M79675 Pain in left toe(s): Secondary | ICD-10-CM

## 2022-11-07 NOTE — Progress Notes (Signed)
ANNUAL DIABETIC FOOT EXAM  Subjective: Darin Mcclure presents today for annual diabetic foot examination.  Chief Complaint  Patient presents with   Diabetes    Diabetic foot care, A1c-6.9 BG-patient is not taking, PCP last seen 07/09/2022   Patient confirms h/o diabetes.  Patient denies any h/o foot wounds.  Patient has been diagnosed with neuropathy.  Risk factors: diabetes, diabetic neuropathy, CAD, hypercholesterolemia.  Chesley Noon, MD is patient's PCP.  Past Medical History:  Diagnosis Date   Allergy    Blood transfusion without reported diagnosis    Coronary artery disease    cath/stent 2007   Diabetic polyneuropathy associated with diabetes mellitus due to underlying condition ([place]) 08/26/2017   GERD (gastroesophageal reflux disease)    Heart disease    Hyperglycemia    Hypertension    Mixed hyperlipidemia    Muscle cramp 08/26/2017   Neuropathy    Osteoarthritis    RBBB    RBBB (right bundle branch block)    Type 2 diabetes mellitus [facility])    Patient Active Problem List   Diagnosis Date Noted   Coronary artery disease involving native coronary artery of native heart without angina pectoris 12/29/2021   Pure hypercholesterolemia 12/29/2021   Right bundle branch block 12/29/2021   Carotid artery disease ([place]) 12/29/2021   Gastroesophageal reflux disease without esophagitis 04/19/2021   Impingement syndrome of left shoulder region 05/17/2020   History of prosthetic unicompartmental arthroplasty of left knee 11/18/2019   Pain in left knee 09/24/2018   Pain of left hip joint 09/24/2018   Diabetic polyneuropathy associated with diabetes mellitus due to underlying condition ([place]) 08/26/2017   Muscle cramp 08/26/2017   Past Surgical History:  Procedure Laterality Date   ACHILLES TENDON REPAIR Right    BACK SURGERY     CORONARY ANGIOPLASTY     pror stent to mid RCA 10/04/2006   FOOT SURGERY     Cyst removal   INGUINAL HERNIA REPAIR     PERCUTANEOUS  CORONARY STENT INTERVENTION (PCI-S)  09/2006   REPLACEMENT TOTAL KNEE Left    TOTAL HIP ARTHROPLASTY Right    TOTAL HIP ARTHROPLASTY Left    Current Outpatient Medications on File Prior to Visit  Medication Sig Dispense Refill   amoxicillin (AMOXIL) 500 MG capsule As needed for dental procedure     aspirin EC 81 MG tablet Take 81 mg by mouth daily.     atorvastatin (LIPITOR) 10 MG tablet Take 10 mg by mouth daily.  2   carisoprodol (SOMA) 250 MG tablet Take by mouth as needed.     clopidogrel (PLAVIX) 75 MG tablet Take 1 tablet by mouth daily.     EFUDEX 5 % cream Apply topically daily. For 2 weeks     famotidine (PEPCID) 20 MG tablet Take 1 tablet (20 mg total) by mouth daily. 30 tablet 1   fluticasone (FLONASE) 50 MCG/ACT nasal spray Place 1 spray into both nostrils daily as needed.   3   fluticasone (FLONASE) 50 MCG/ACT nasal spray Place 2 sprays into both nostrils daily.     JANUVIA 100 MG tablet Take 100 mg by mouth daily.     levothyroxine (SYNTHROID) 75 MCG tablet Take 75 mcg by mouth daily.     losartan (COZAAR) 100 MG tablet Take 1 tablet by mouth daily.     Magnesium 500 MG CAPS Take 1,000 mg by mouth.     meloxicam (MOBIC) 15 MG tablet Take 1 tablet (15 mg total) by mouth daily. (  Patient taking differently: Take 15 mg by mouth as needed.) 60 tablet 1   metFORMIN (GLUCOPHAGE) 1000 MG tablet Take 1 tablet by mouth 2 (two) times daily.     Methylcellulose, Laxative, (CITRUCEL PO) Take by mouth. (Patient not taking: Reported on 02/05/2022)     metoprolol succinate (TOPROL-XL) 50 MG 24 hr tablet Take 1 tablet by mouth 2 (two) times daily.     metroNIDAZOLE (METROGEL) 1 % gel Apply topically daily.     Multiple Vitamin (MULTIVITAMIN) tablet Take 1 tablet by mouth daily.     Omega-3 Fatty Acids (OMEGA-3 FISH OIL PO) Take by mouth.     ONE TOUCH ULTRA TEST test strip USE 1 TO 2 TIMES A DAY  5   pantoprazole (PROTONIX) 40 MG tablet Take 1 tablet by mouth daily.     Probiotic Product  (PROBIOTIC DAILY PO) Take by mouth.     triamcinolone cream (KENALOG) 0.1 % apply ON THE SKIN 2 TIMES DAILY AS NEEDED     No current facility-administered medications on file prior to visit.    No Known Allergies Social History   Occupational History   Occupation: retired  Tobacco Use   Smoking status: Never   Smokeless tobacco: Never  Vaping Use   Vaping Use: Never used  Substance and Sexual Activity   Alcohol use: Yes    Comment: Rarely   Drug use: No   Sexual activity: Not on file   Family History  Problem Relation Age of Onset   CAD Father    Lung cancer Father    Colon cancer Neg Hx    Esophageal cancer Neg Hx    Rectal cancer Neg Hx    Stomach cancer Neg Hx    Immunization History  Administered Date(s) Administered   Fluad Quad(high Dose 65+) 08/31/2019   Hepatitis A, Ped/Adol-2 Dose 11/08/2017, 05/08/2018   Hepatitis B, PED/ADOLESCENT 11/08/2017, 01/03/2018, 05/08/2018   Hepatitis B, adult 11/08/2017, 01/03/2018, 05/08/2018   PFIZER(Purple Top)SARS-COV-2 Vaccination 01/17/2019, 02/08/2019, 01/18/2020, 02/08/2020, 09/06/2020, 03/22/2021, 09/19/2021   Pneumococcal Conjugate-13 12/13/2014   Pneumococcal Polysaccharide-23 11/08/2017   Tdap 12/13/2014   Zoster Recombinat (Shingrix) 08/08/2017, 03/10/2018   Zoster, Unspecified 08/08/2017, 03/10/2018     Review of Systems: Negative except as noted in the HPI.   Objective: There were no vitals filed for this visit.  [name] is a pleasant 81 y.o. male in NAD. AAO X 3.  Vascular Examination: Capillary refill time immediate b/l. Vascular status intact b/l with palpable pedal pulses. Pedal hair present b/l. No edema. No pain with calf compression b/l. Skin temperature gradient WNL b/l. No cyanosis or clubbing noted b/l LE.  Neurological Examination: Sensation grossly intact b/l with 10 gram monofilament. Vibratory sensation intact b/l. Pt has subjective symptoms of neuropathy.  Dermatological  Examination: Pedal skin with normal turgor, texture and tone b/l. Toenails left 2nd, left 3rd and toenails 1-3 right are  thick, discolored, elongated with subungual debris and pain on dorsal palpation. No open wounds b/l LE. No interdigital macerations noted b/l LE.  Musculoskeletal Examination: Muscle strength 5/5 to all lower extremity muscle groups bilaterally. Hammertoe(s) noted to the b/l lower extremities.  Radiographs: None  Footwear Assessment: Does the patient wear appropriate shoes? Yes. Does the patient need inserts/orthotics? No.  ADA Risk Categorization: Low Risk :  Patient has all of the following: Intact protective sensation No prior foot ulcer  No severe deformity Pedal pulses present  Assessment: 1. Pain due to onychomycosis of toenails of both feet  2. Callus   3. Diabetic peripheral neuropathy associated with type 2 diabetes mellitus ([place])   4. Hammertoe, bilateral   5. Encounter for diabetic foot exam ([place])     Plan: No orders of the defined types were placed in this encounter.  -Consent given for treatment as described below: -Diabetic foot examination performed today. -Continue diabetic foot care principles: inspect feet daily, monitor glucose as recommended by PCP and/or Endocrinologist, and follow prescribed diet per PCP, Endocrinologist and/or dietician. -Patient to continue soft, supportive shoe gear daily. -Mycotic toenails left second digit, left third digit, right great toe, right second digit, and right third digit were debrided in length and girth with sterile nail nippers and dremel without iatrogenic bleeding. -Nondystrophic toenails trimmed x 5. -Callus(es) right great toe pared utilizing sterile scalpel blade without complication or incident. Total number debrided =1. -Patient/POA to call should there be question/concern in the interim. Return in about 3 months (around 02/06/2023).  Marzetta Board, DPM

## 2022-11-11 ENCOUNTER — Encounter: Payer: Self-pay | Admitting: Podiatry

## 2022-12-25 ENCOUNTER — Ambulatory Visit: Payer: Federal, State, Local not specified - PPO | Admitting: Neurology

## 2023-01-14 NOTE — Progress Notes (Unsigned)
Office Visit    Patient Name: Darin Mcclure Date of Encounter: 01/15/2023  PCP:  Chesley Noon, MD   [place]  Cardiologist:  Candee Furbish, MD  Advanced Practice Provider:  No care team member to display Electrophysiologist:  None    {  HPI    Darin Mcclure is a 82 y.o. male with a past medical history of a drug-eluting stent placed October 04, 2006 to the mid right coronary artery, hypertension, hyperlipidemia, RBBB presents today for follow-up appointment.  Repeat cardiac catheterization 2009 showed normal coronary stent in right coronary artery and moderate disease involving the LAD and diagonal branches.  Has mild carotid plaque as well.  Left-sided chest discomfort at times at prior visit which was concerning to him.  Stress test was performed 01/12/2021 and was low risk with no ischemia.  Reassuring.  He states that especially in the evening hours he feels a sensation where he cannot get a deep breath at all.  Does not sound cardiac.  It was discussed at his last appointment.  He was trying some deep breathing exercises to help.  He was last seen by Dr. Marlou Porch 12/2021 and had several questions about his medication.  He believes that the omega-3 was the reason his cholesterol was optimal.  Triglycerides are still in the 200 range.  Discussed that his diabetes control will contribute to that.  His hemoglobin A1c was 7.4.  His TSH was in the 3 range.  Excellent.  He states that in some of his readings it should be 0.  His stress test results were discussed.  Very pleased.  He worked for [employer] for 50 years.  Today, he states that his triglycerides are 168.  He remains on fish oil and a statin.  Needs to be over 200.  He is looking into lose some weight and is asked for a referral to a personal trainer that also is a nutritionist.  Overall, doing well from a cardiac standpoint.  He still suffers from chronic back pain.  Sometimes it can limit his  activity.  Denies shortness of breath or palpitations.   Reports no shortness of breath nor dyspnea on exertion. Reports no chest pain, pressure, or tightness. No edema, orthopnea, PND. Reports no palpitations.    Past Medical History    Past Medical History:  Diagnosis Date   Allergy    Blood transfusion without reported diagnosis    Coronary artery disease    cath/stent 2007   Diabetic polyneuropathy associated with diabetes mellitus due to underlying condition ([place]) 08/26/2017   GERD (gastroesophageal reflux disease)    Heart disease    Hyperglycemia    Hypertension    Mixed hyperlipidemia    Muscle cramp 08/26/2017   Neuropathy    Osteoarthritis    RBBB    RBBB (right bundle branch block)    Type 2 diabetes mellitus ([place])    Past Surgical History:  Procedure Laterality Date   ACHILLES TENDON REPAIR Right    BACK SURGERY     CORONARY ANGIOPLASTY     pror stent to mid RCA 10/04/2006   FOOT SURGERY     Cyst removal   INGUINAL HERNIA REPAIR     PERCUTANEOUS CORONARY STENT INTERVENTION (PCI-S)  09/2006   REPLACEMENT TOTAL KNEE Left    TOTAL HIP ARTHROPLASTY Right    TOTAL HIP ARTHROPLASTY Left     Allergies  No Known Allergies   EKGs/Labs/Other Studies Reviewed:   The  following studies were reviewed today: Nuclear stress test 01/12/2021: The left ventricular ejection fraction is hyperdynamic (>65%). Nuclear stress EF: 68%. No wall motion abnormality There was no ST segment deviation noted during stress. Defect 1: There is a small defect of mild severity present in the apex location. No ischemia identified. This is a low risk study.   Echocardiogram 01/12/2021:   1. Left ventricular ejection fraction, by estimation, is 60 to 65%. The  left ventricle has normal function. The left ventricle has no regional  wall motion abnormalities. The left ventricular internal cavity size was  mildly dilated. Left ventricular  diastolic parameters are consistent with Grade  II diastolic dysfunction  (pseudonormalization).   2. Right ventricular systolic function is normal. The right ventricular  size is normal. There is mildly elevated pulmonary artery systolic  pressure.   3. Left atrial size was mildly dilated.   4. The mitral valve is abnormal. Mild mitral valve regurgitation.  Moderate mitral annular calcification.   5. The aortic valve is abnormal. Aortic valve regurgitation is not  visualized. Mild aortic valve sclerosis is present, with no evidence of  aortic valve stenosis.   6. The inferior vena cava is normal in size with greater than 50%  respiratory variability, suggesting right atrial pressure of 3 mmHg.   Comparison(s): The left ventricular function is unchanged. Report only  02/28/16 EF 68%.    Carotid Dopplers 2020-mild disease bilaterally  EKG:  EKG is  ordered today.  The ekg ordered today demonstrates NSR rate 55 bpm  Recent Labs: 02/05/2022: ALT 34; BUN 19; Creatinine, Ser 1.44; Hemoglobin 14.2; Platelets 178.0; Potassium 4.2; Sodium 139  Recent Lipid Panel    Component Value Date/Time   CHOL 100 12/25/2017 0845   TRIG 166 (H) 12/25/2017 0845   HDL 36 (L) 12/25/2017 0845   CHOLHDL 2.8 12/25/2017 0845   LDLCALC 31 12/25/2017 0845     Home Medications   Current Meds  Medication Sig   amoxicillin (AMOXIL) 500 MG capsule As needed for dental procedure   aspirin EC 81 MG tablet Take 81 mg by mouth daily.   atorvastatin (LIPITOR) 10 MG tablet Take 10 mg by mouth daily.   carisoprodol (SOMA) 250 MG tablet Take by mouth as needed.   clopidogrel (PLAVIX) 75 MG tablet Take 1 tablet by mouth daily.   famotidine (PEPCID) 20 MG tablet Take 1 tablet (20 mg total) by mouth daily.   fluticasone (FLONASE) 50 MCG/ACT nasal spray Place 1 spray into both nostrils daily as needed.    JANUVIA 100 MG tablet Take 100 mg by mouth daily.   levothyroxine (SYNTHROID) 75 MCG tablet Take 75 mcg by mouth daily.   losartan (COZAAR) 100 MG tablet Take 1  tablet by mouth daily.   Magnesium 500 MG CAPS Take 1,000 mg by mouth.   meloxicam (MOBIC) 15 MG tablet Take 1 tablet (15 mg total) by mouth daily.   metFORMIN (GLUCOPHAGE) 1000 MG tablet Take 1 tablet by mouth 2 (two) times daily.   Methylcellulose, Laxative, (CITRUCEL PO) Take by mouth.   metoprolol succinate (TOPROL-XL) 50 MG 24 hr tablet Take 1 tablet by mouth 2 (two) times daily.   Multiple Vitamin (MULTIVITAMIN) tablet Take 1 tablet by mouth daily.   Omega-3 Fatty Acids (OMEGA-3 FISH OIL PO) Take by mouth.   ONE TOUCH ULTRA TEST test strip USE 1 TO 2 TIMES A DAY   Probiotic Product (PROBIOTIC DAILY PO) Take 1 tablet by mouth daily.  Review of Systems      All other systems reviewed and are otherwise negative except as noted above.  Physical Exam    VS:  BP 138/62   Pulse (!) 55   Ht 6' (1.829 m)   Wt 238 lb 9.6 oz (108.2 kg)   SpO2 98%   BMI 32.36 kg/m  , BMI Body mass index is 32.36 kg/m.  Wt Readings from Last 3 Encounters:  01/15/23 238 lb 9.6 oz (108.2 kg)  02/05/22 240 lb (108.9 kg)  12/29/21 247 lb (112 kg)     GEN: Well nourished, well developed, in no acute distress. HEENT: normal. Neck: Supple, no JVD, carotid bruits, or masses. Cardiac: RRR, no murmurs, rubs, or gallops. No clubbing, cyanosis, edema.  Radials/PT 2+ and equal bilaterally.  Respiratory:  Respirations regular and unlabored, clear to auscultation bilaterally. GI: Soft, nontender, nondistended. MS: No deformity or atrophy. Skin: Warm and dry, no rash. Neuro:  Strength and sensation are intact. Psych: Normal affect.  Assessment & Plan    Carotid artery disease -[place] next year -no symptoms, denies syncope, presyncope, dizziness, lightheadedness -no bruit on exam   Pure hypercholesterolemia -Triglycerides have always been a problem for him -recent triglycerides 162 -continue fish oil and lipitor -repeat lipid panel through PCP  Hypertension -better on repeat -continue losartan  151m daily and metoprolol succinate 548mBID -could change sartan in the future if needed  Right bundle branch block -stable  Overweight -He is wanting to loose 40 lbs by christmas -personal trainer and nutrition coach referral today through [facility]       Disposition: Follow up 1 year with MaCandee FurbishMD or APP.  Signed, TeElgie CollardPA-C 01/15/2023, 12:40 PM [facility]

## 2023-01-15 ENCOUNTER — Encounter: Payer: Self-pay | Admitting: Physician Assistant

## 2023-01-15 ENCOUNTER — Ambulatory Visit: Payer: Federal, State, Local not specified - PPO | Attending: Physician Assistant | Admitting: Physician Assistant

## 2023-01-15 VITALS — BP 138/62 | HR 55 | Ht 72.0 in | Wt 238.6 lb

## 2023-01-15 DIAGNOSIS — I2510 Atherosclerotic heart disease of native coronary artery without angina pectoris: Secondary | ICD-10-CM | POA: Diagnosis not present

## 2023-01-15 DIAGNOSIS — I6523 Occlusion and stenosis of bilateral carotid arteries: Secondary | ICD-10-CM | POA: Diagnosis not present

## 2023-01-15 DIAGNOSIS — E7800 Pure hypercholesterolemia, unspecified: Secondary | ICD-10-CM | POA: Diagnosis not present

## 2023-01-15 DIAGNOSIS — I4510 Unspecified right bundle-branch block: Secondary | ICD-10-CM

## 2023-01-15 NOTE — Patient Instructions (Addendum)
Medication Instructions:  Your physician recommends that you continue on your current medications as directed. Please refer to the Current Medication list given to you today.  *If you need a refill on your cardiac medications before your next appointment, please call your pharmacy*   Lab Work: None ordered If you have labs (blood work) drawn today and your tests are completely normal, you will receive your results only by: [place] (if you have MyChart) OR A paper copy in the mail If you have any lab test that is abnormal or we need to change your treatment, we will call you to review the results.   Follow-Up: At [facility], you and your health needs are our priority.  As part of our continuing mission to provide you with exceptional heart care, we have created designated Provider Care Teams.  These Care Teams include your primary Cardiologist (physician) and Advanced Practice Providers (APPs -  Physician Assistants and Nurse Practitioners) who all work together to provide you with the care you need, when you need it.  We recommend signing up for the patient portal called "MyChart".  Sign up information is provided on this After Visit Summary.  MyChart is used to connect with patients for Virtual Visits (Telemedicine).  Patients are able to view lab/test results, encounter notes, upcoming appointments, etc.  Non-urgent messages can be sent to your provider as well.   To learn more about what you can do with MyChart, go to [email or web address].    Your next appointment:   1 year(s)  Provider:   Candee Furbish, MD  or Nicholes Rough, PA-C       Other Instructions You have been referred to [facility]

## 2023-02-12 ENCOUNTER — Encounter: Payer: Self-pay | Admitting: Neurology

## 2023-02-12 ENCOUNTER — Ambulatory Visit: Payer: Federal, State, Local not specified - PPO | Admitting: Neurology

## 2023-02-12 VITALS — BP 169/75 | HR 64 | Ht 72.0 in | Wt 242.0 lb

## 2023-02-12 DIAGNOSIS — E1142 Type 2 diabetes mellitus with diabetic polyneuropathy: Secondary | ICD-10-CM

## 2023-02-12 DIAGNOSIS — G3184 Mild cognitive impairment, so stated: Secondary | ICD-10-CM | POA: Diagnosis not present

## 2023-02-12 NOTE — Progress Notes (Signed)
Follow-up Visit   Date: 02/12/23    Darin Mcclure MRN: [id number] DOB: 1941/08/08   Interim History: Darin Mcclure is a 82 y.o. right-handed Caucasian male with hypertensin, hyperlipidemia, diabetes mellitus, CAD s/p PCI with stent, and GERD returning to the clinic for follow-up of neuropathy.    IMPRESSION/PLAN: Peripheral neuropathy, contributed by diabetes (well-controlled, HbA1c 6.4).  Symptoms are predominately numbness in the lower legs and feet and mild sensory ataxia.   Unfortunately, there is no cure and management remains supportive.   - Patient educated on daily foot inspection, fall prevention, and safety precautions around the home.  - All questions addressed  Mild cognitive impairment with word-finding difficulty, age-related.  Patient reassured he does not have any signs of dementia  Lumbosacral canal stenosis, he will be seeing Dr. Ronnald Ramp  Return to clinic in 1 year ---------------------------------------------- History of present illness: He started having numbness of toes, which had progressed over the years to involve his foot and lower leg.  He was diagnosed with neuropathy and seen at [facility] and many years later, was diagnosed with diabetes mellitus.  He complains that his left foot feels heavy, especially when he wakes up.  He has very brief spells of pulsating sharp pain, lasting 30-min to several hours which can affect both legs.  He saw Dr. Yehuda Savannah, [place]. while living in [place] and moved here in July 2018.  He had NCS/EMG which confirmed neuropathy.    He also complained of being forgetful at times, such as walking into the room and forgetting the reason for being there.  He is able to maintain all instrumental ADLs such as driving, managing finances, and household chores.  UPDATE 03/13/2021:  He is here for 1 year follow-up.  He has noticed increased difficulty with remembering names and experiencing more word-finding difficulty. He does not  have other memory issues, such as increased forgetfullness or misplacing things. He is able to manage medications, finances, and household duties.  No issues with driving or navigating.  His neuropathy seems to be stable with numbness in the feet and lower legs. He has rare spells of recurrent sharp pain in the feet, lasting about 15 seconds.  He has many questions about his neuropathy.  He has a hemtoma over the left medial leg which occurred after hitting his leg onto his trailer.   UPDATE 02/12/2023: He is here for follow-up visit.  He continues to have numbness in the feet and lower legs, which is unchanged.  Occasionally, he has shooting pain in the feet, which is very brief.  Balance is fair.  He walks unassisted, no falls.   He continues to have word-finding difficulty, especially with a names. Otherwise, he is able to perform all ADLs and IADLs.  He remains active and independent.   He is having chronic low back pain and will be seeing Dr. Ronnald Ramp for second opinion regarding management of his lumbar spondylosis.  He has been getting ESI by Dr. Nelva Bush.   Medications:  Current Outpatient Medications on File Prior to Visit  Medication Sig Dispense Refill   amoxicillin (AMOXIL) 500 MG capsule As needed for dental procedure     aspirin EC 81 MG tablet Take 81 mg by mouth daily.     atorvastatin (LIPITOR) 10 MG tablet Take 10 mg by mouth daily.  2   carisoprodol (SOMA) 250 MG tablet Take by mouth as needed.     clopidogrel (PLAVIX) 75 MG tablet Take 1 tablet by mouth daily.  famotidine (PEPCID) 20 MG tablet Take 1 tablet (20 mg total) by mouth daily. 30 tablet 1   fluticasone (FLONASE) 50 MCG/ACT nasal spray Place 1 spray into both nostrils daily as needed.   3   JANUVIA 100 MG tablet Take 100 mg by mouth daily.     levothyroxine (SYNTHROID) 75 MCG tablet Take 75 mcg by mouth daily.     losartan (COZAAR) 100 MG tablet Take 1 tablet by mouth daily.     Magnesium 500 MG CAPS Take 1,000 mg by  mouth.     meloxicam (MOBIC) 15 MG tablet Take 1 tablet (15 mg total) by mouth daily. 60 tablet 1   metFORMIN (GLUCOPHAGE) 1000 MG tablet Take 1 tablet by mouth 2 (two) times daily.     Methylcellulose, Laxative, (CITRUCEL PO) Take by mouth.     metoprolol succinate (TOPROL-XL) 50 MG 24 hr tablet Take 1 tablet by mouth 2 (two) times daily.     Multiple Vitamin (MULTIVITAMIN) tablet Take 1 tablet by mouth daily.     Omega-3 Fatty Acids (OMEGA-3 FISH OIL PO) Take by mouth.     ONE TOUCH ULTRA TEST test strip USE 1 TO 2 TIMES A DAY  5   Probiotic Product (PROBIOTIC DAILY PO) Take 1 tablet by mouth daily.     No current facility-administered medications on file prior to visit.    Allergies: No Known Allergies  Vital Signs:  BP (!) 169/75   Pulse 64   Ht 6' (1.829 m)   Wt 242 lb (109.8 kg)   SpO2 98%   BMI 32.82 kg/m   Neurological Exam: MENTAL STATUS including orientation to time, place, person, recent and remote memory, attention span and concentration, language, and fund of knowledge is normal.  Speech is not dysarthric.  CRANIAL NERVES:    Extraocular muscles intact. Face is symmetric.  MOTOR:  Motor strength is 5/5 in all extremities, including distally. No pronator drift.  Tone is normal.   SENSORY:  Absent vibration at the ankles; diminished pin prick and temperature distal to ankles bilaterally.  Rhomberg sign is positive.  REFLEXES:  Reflexes are 2+/4 throughout, except 3+/4 at the patella bilaterally.    COORDINATION/GAIT:   Gait narrow based and stable, mildly wide-based.  Unable to perform tandem gait.  Unassisted  Data: Labs 08/03/2016:  Folate >24, TSH 2.37, Lyme neg, RF neg, CCP neg, HbA1c 6.3, ANA neg, vitamin B12 309 Labs 08/26/2017:  Vitamin B12 298, copper 78, MMA 165, SPEP with IFE no M protein Lab Results  Component Value Date   VITAMINB12 391 12/17/2018    TCD 08/03/2016:  Normal EEG 08/03/2016:  Normal  Cognitive testing 08/03/2016:  Normal NCS/EMG of the legs  08/03/2016:  Diffuse sensorimotor polyneuropathy US carotids 12/15/2018:  1-39% bilateral ICA stenosis   Lab Results  Component Value Date   HGBA1C 6.9 (H) 12/17/2018   Total time spent reviewing records, interview, history/exam, documentation, and coordination of care on day of encounter:  30 min    Thank you for allowing me to participate in patient's care.  If I can answer any additional questions, I would be pleased to do so.    Sincerely,    Floretta Petro K. Posey Pronto, DO

## 2023-02-12 NOTE — Patient Instructions (Signed)
I will see you back in 1 year 

## 2023-02-18 ENCOUNTER — Encounter: Payer: Self-pay | Admitting: Podiatrist

## 2023-02-18 ENCOUNTER — Ambulatory Visit (INDEPENDENT_AMBULATORY_CARE_PROVIDER_SITE_OTHER): Payer: Federal, State, Local not specified - PPO | Admitting: Podiatrist

## 2023-02-18 DIAGNOSIS — M79675 Pain in left toe(s): Secondary | ICD-10-CM

## 2023-02-18 DIAGNOSIS — B351 Tinea unguium: Secondary | ICD-10-CM

## 2023-02-18 DIAGNOSIS — M79674 Pain in right toe(s): Secondary | ICD-10-CM | POA: Diagnosis not present

## 2023-02-18 DIAGNOSIS — E1142 Type 2 diabetes mellitus with diabetic polyneuropathy: Secondary | ICD-10-CM | POA: Diagnosis not present

## 2023-02-18 NOTE — Progress Notes (Signed)
Chief Complaint  Patient presents with   Nail Problem    [facility] BS- Did not check today A1C-6.4 PCP-[facility] PCP VST-11/2022     Subjective:  Patient ID: Darin Mcclure, male    DOB: 06-02-1941,  MRN: [id number]  Darin Mcclure presents to clinic today for at risk foot care with history of diabetic neuropathy and corn(s) left foot, callus(es) right lower extremity and painful mycotic nails.  Pain interferes with ambulation. Aggravating factors include wearing enclosed shoe gear. Painful toenails interfere with ambulation. Aggravating factors include wearing enclosed shoe gear. Pain is relieved with periodic professional debridement. Painful corns and calluses are aggravated when weightbearing with and without shoegear. Pain is relieved with periodic professional debridement.  Last known HgA1c was 6.4%. Patient did not check blood glucose today.  New problem(s): None.   PCP is Chesley Noon, MD , and last visit was January 2024  No Known Allergies  Review of Systems: Negative except as noted in the HPI.  Objective: No changes noted in today's physical examination. Mattia Rocca is a pleasant 82 y.o. male in NAD. AAO X 3.  Vascular Examination: CFT immediate b/l LE. Palpable DP/PT pulses b/l LE. Digital hair present b/l. Skin temperature gradient WNL b/l. No pain with calf compression b/l. No edema noted b/l. No cyanosis or clubbing noted b/l LE.  Dermatological Examination: Pedal skin is warm and supple b/l LE. No open wounds b/l LE. No interdigital macerations noted b/l LE. Toenails L  2nd toe, L 3rd toe, R hallux, R 2nd toe, and R 3rd toe elongated, discolored, dystrophic, thickened, and crumbly with subungual debris and tenderness to dorsal palpation. No hyperkeratotic nor porokeratotic lesions present on today's visit.   Musculoskeletal Examination: Normal muscle strength 5/5 to all lower extremity muscle groups bilaterally. Hammertoe deformity noted 1-5 b/l.Marland Kitchen No pain, crepitus or  joint limitation noted with ROM b/l LE.  Patient ambulates independently without assistive aids.  Neurological Examination: Pt has subjective symptoms of neuropathy. Protective sensation intact 5/5 intact bilaterally with 10g monofilament b/l. Vibratory sensation intact b/l.  Assessment/Plan:   ICD-10-CM   1. Diabetic peripheral neuropathy associated with type 2 diabetes mellitus (HCC)  E11.42     2. Pain due to onychomycosis of toenails of both feet  B35.1    M79.675    M79.674         Discussed exam findings with the patient.  Recommended a nail debridement.  This was carried out today with sterile nail nippers and a power burr without complication.  Periodic routine nail debridement recommended every 3 months or as needed for follow-up.   Bronson Ing, DPM

## 2023-02-18 NOTE — Patient Instructions (Signed)
Try some lamisil cream for the 3 dots on your left great toe  Try some fungicure to paint under the right great toenail

## 2023-03-11 ENCOUNTER — Telehealth: Payer: Self-pay | Admitting: *Deleted

## 2023-03-11 NOTE — Telephone Encounter (Signed)
   Pre-operative Risk Assessment    Patient Name: Darin Mcclure  DOB: 03-14-1941 MRN: [id number]      Request for Surgical Clearance    Procedure:   L4-5 LUMBAR LAMINECTOMY  Date of Surgery:  Clearance TBD                                 Surgeon:  Tia Alert, MD Surgeon's Group or Practice Name:  [facility] Phone number:  [phone] Fax number:  [phone]   Type of Clearance Requested:   - Medical  - Pharmacy:  Hold Aspirin and Clopidogrel (Plavix) NOT INDICATED HOW LONG   Type of Anesthesia:  General    Additional requests/questions:    Wilhemina Cash   03/11/2023, 12:41 PM

## 2023-03-12 NOTE — Telephone Encounter (Signed)
   Patient Name: Darin Mcclure  DOB: [birth year] MRN: [id number]  Primary Cardiologist: Donato Schultz, MD  Chart reviewed as part of pre-operative protocol coverage. Pre-op clearance already addressed by colleagues in earlier phone notes. To summarize recommendations:  - Patient may hold his Plavix and aspirin for upcoming laminectomy.  He would not be able to have the surgery on these medications.  He understands the small risk of late stent thrombosis.  -Dr. Anne Fu  He was seen by me less than two months ago and was not having any significant chest pain or SOB at that time. He was able to meet 4 mets. He is at acceptable risk to move forward with procedure without further testing.  Will route this bundled recommendation to requesting provider via Epic fax function and remove from pre-op pool. Please call with questions.  Sharlene Dory, PA-C 03/12/2023, 7:34 AM

## 2023-06-19 ENCOUNTER — Ambulatory Visit: Payer: Medicare Other | Admitting: Podiatry

## 2023-07-26 LAB — LAB REPORT - SCANNED
A1c: 7.3
Albumin, Urine POC: 19.4
Albumin/Creatinine Ratio, Urine, POC: 22
Creatinine, POC: 90 mg/dL
EGFR (Non-African Amer.): 58

## 2023-10-01 ENCOUNTER — Ambulatory Visit (INDEPENDENT_AMBULATORY_CARE_PROVIDER_SITE_OTHER): Payer: Federal, State, Local not specified - PPO | Admitting: Podiatry

## 2023-10-01 DIAGNOSIS — L84 Corns and callosities: Secondary | ICD-10-CM

## 2023-10-01 DIAGNOSIS — B351 Tinea unguium: Secondary | ICD-10-CM | POA: Diagnosis not present

## 2023-10-01 DIAGNOSIS — M79674 Pain in right toe(s): Secondary | ICD-10-CM

## 2023-10-01 DIAGNOSIS — M79675 Pain in left toe(s): Secondary | ICD-10-CM | POA: Diagnosis not present

## 2023-10-01 DIAGNOSIS — E1142 Type 2 diabetes mellitus with diabetic polyneuropathy: Secondary | ICD-10-CM | POA: Diagnosis not present

## 2023-10-01 NOTE — Progress Notes (Signed)
  Subjective:  Patient ID: Darin Mcclure, male    DOB: 02/28/1941,  MRN: [id number]  82 y.o. male presents at risk foot care with history of diabetic neuropathy and painful elongated mycotic toenails 1-5 bilaterally which are tender when wearing enclosed shoe gear. Pain is relieved with periodic professional debridement. Patient states his left great toenail fell off. Denies any trauma to area. Chief Complaint  Patient presents with   Diabetes    [facility] BS - DIDN'T CHECK IT A1C - 6.7 LVPCP - 07/2023   PCP is Darin Inch, MD , and last visit was January 14, 2023.  No Known Allergies  Review of Systems: Negative except as noted in the HPI.   Objective:  Darin Mcclure is a pleasant 82 y.o. male WD, WN in NAD. AAO x 3.  Vascular Examination: Vascular status intact b/l with palpable pedal pulses. CFT immediate b/l. Pedal hair present. No edema. No pain with calf compression b/l. Skin temperature gradient WNL b/l. No varicosities noted. No cyanosis or clubbing noted.  Neurological Examination: Pt has subjective symptoms of neuropathy. Sensation grossly intact b/l with 10 gram monofilament. Vibratory sensation intact b/l.  Dermatological Examination: Pedal skin with normal turgor, texture and tone b/l. No open wounds nor interdigital macerations noted.   Toenails 2-5 bilaterally and R hallux elongated, discolored, dystrophic, thickened, and crumbly with subungual debris and tenderness to dorsal palpation. Anonychia noted L hallux. Nailbed(s) epithelialized.  There is evidence of subacute subungual hematoma of the L 2nd toe. Nailplate remains adhered. There is no  tenderness to palpation.  No hyperkeratotic lesions noted b/l.   Musculoskeletal Examination: Muscle strength 5/5 to b/l LE.  No pain, crepitus noted b/l. No gross pedal deformities. Patient ambulates independently without assistive aids.   Radiographs: None  Last A1c:       No data to display         Assessment:    1. Pain due to onychomycosis of toenails of both feet   2. Diabetic peripheral neuropathy associated with type 2 diabetes mellitus (HCC)    Plan:  -Consent given for treatment as described below: -Examined patient. -History of spontaneous avulsion of toenail. Asymptomatic. No further treatment required. Infomed patient nail will grow back in 3-4 months. -Continue foot and shoe inspections daily. Monitor blood glucose per PCP/Endocrinologist's recommendations. -Patient to continue soft, supportive shoe gear daily. -Toenails 2-5 bilaterally and right great toe were debrided in length and girth with sterile nail nippers without iatrogenic bleeding.  -Patient/POA to call should there be question/concern in the interim.  Return in about 3 months (around 01/01/2024).  Darin Mcclure, DPM

## 2023-10-04 ENCOUNTER — Encounter: Payer: Self-pay | Admitting: Podiatry

## 2023-11-28 NOTE — Progress Notes (Signed)
 Office Visit    Patient Name: Darin Mcclure Date of Encounter: 11/29/2023  PCP:  Sophronia Ozell BROCKS, MD   [facility]  Cardiologist:  Oneil Parchment, MD  Advanced Practice Provider:  No care team member to display Electrophysiologist:  None    {  HPI    Darin Mcclure is a 83 y.o. male with a past medical history of a drug-eluting stent placed October 04, 2006 to the mid right coronary artery, hypertension, hyperlipidemia, RBBB presents today for follow-up appointment.  Repeat cardiac catheterization 2009 showed normal coronary stent in right coronary artery and moderate disease involving the LAD and diagonal branches.  Has mild carotid plaque as well.  Left-sided chest discomfort at times at prior visit which was concerning to him.  Stress test was performed 01/12/2021 and was low risk with no ischemia.  Reassuring.  He states that especially in the evening hours he feels a sensation where he cannot get a deep breath at all.  Does not sound cardiac.  It was discussed at his last appointment.  He was trying some deep breathing exercises to help.  He was last seen by Dr. Parchment 12/2021 and had several questions about his medication.  He believes that the omega-3 was the reason his cholesterol was optimal.  Triglycerides are still in the 200 range.  Discussed that his diabetes control will contribute to that.  His hemoglobin A1c was 7.4.  His TSH was in the 3 range.  Excellent.  He states that in some of his readings it should be 0.  His stress test results were discussed.  Very pleased.  He worked for [employer] for 50 years.  He was seen by me 2/24, he states that his triglycerides are 168.  He remains on fish oil and a statin.  Needs to be over 200.  He is looking into lose some weight and is asked for a referral to a personal trainer that also is a nutritionist.  Overall, doing well from a cardiac standpoint.  He still suffers from chronic back pain.  Sometimes it  can limit his activity.  Denies shortness of breath or palpitations.  Today, he presents with a history of stent placement, back problems, and diabetes with a periodic difficulty in taking deep breaths, especially in the evening. This issue has been ongoing for a few months and is not associated with pain or a feeling of something catching. The patient has had a chest x-ray, which showed no issues. The patient also experiences pain in one leg when sitting, which is not associated with walking or exertion. The patient's blood pressure is slightly elevated, but he reports it usually decreases after taking his losartan  medication. The patient's blood glucose and triglycerides have been high recently, but he has returned to his previous medication regimen in hopes of improving these levels.  Reports no shortness of breath nor dyspnea on exertion. Reports no chest pain, pressure, or tightness. No edema, orthopnea, PND. Reports no palpitations.   Discussed the use of AI scribe software for clinical note transcription with the patient, who gave verbal consent to proceed.  Past Medical History    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) 08/26/2017   GERD (gastroesophageal reflux disease)    Heart disease  Hyperglycemia    Hypertension    Mixed hyperlipidemia    Muscle cramp 08/26/2017   Neuropathy    Osteoarthritis    RBBB    RBBB (right bundle branch block)    Type 2 diabetes mellitus (HCC)    Past Surgical History:  Procedure Laterality Date   ACHILLES TENDON REPAIR Right    BACK SURGERY     CORONARY ANGIOPLASTY     pror stent to mid RCA 10/04/2006   FOOT SURGERY     Cyst removal   INGUINAL HERNIA REPAIR     PERCUTANEOUS CORONARY STENT INTERVENTION (PCI-S)  09/2006   REPLACEMENT TOTAL KNEE  Left    TOTAL HIP ARTHROPLASTY Right    TOTAL HIP ARTHROPLASTY Left     Allergies  No Known Allergies   EKGs/Labs/Other Studies Reviewed:   The following studies were reviewed today: Nuclear stress test 01/12/2021: The left ventricular ejection fraction is hyperdynamic (>65%). Nuclear stress EF: 68%. No wall motion abnormality There was no ST segment deviation noted during stress. Defect 1: There is a small defect of mild severity present in the apex location. No ischemia identified. This is a low risk study.   Echocardiogram 01/12/2021:   1. Left ventricular ejection fraction, by estimation, is 60 to 65%. The  left ventricle has normal function. The left ventricle has no regional  wall motion abnormalities. The left ventricular internal cavity size was  mildly dilated. Left ventricular  diastolic parameters are consistent with Grade II diastolic dysfunction  (pseudonormalization).   2. Right ventricular systolic function is normal. The right ventricular  size is normal. There is mildly elevated pulmonary artery systolic  pressure.   3. Left atrial size was mildly dilated.   4. The mitral valve is abnormal. Mild mitral valve regurgitation.  Moderate mitral annular calcification.   5. The aortic valve is abnormal. Aortic valve regurgitation is not  visualized. Mild aortic valve sclerosis is present, with no evidence of  aortic valve stenosis.   6. The inferior vena cava is normal in size with greater than 50%  respiratory variability, suggesting right atrial pressure of 3 mmHg.   Comparison(s): The left ventricular function is unchanged. Report only  02/28/16 EF 68%.    Carotid Dopplers 2020-mild disease bilaterally  EKG:  EKG is  ordered today.  The ekg ordered today demonstrates NSR rate 52 bpm  Recent Labs: No results found for requested labs within last 365 days.  Recent Lipid Panel    Component Value Date/Time   CHOL 100 12/25/2017 0845   TRIG 166 (H) 12/25/2017  0845   HDL 36 (L) 12/25/2017 0845   CHOLHDL 2.8 12/25/2017 0845   LDLCALC 31 12/25/2017 0845     Home Medications   Current Meds  Medication Sig   amoxicillin (AMOXIL) 500 MG capsule As needed for dental procedure   aspirin  EC 81 MG tablet Take 81 mg by mouth daily.   carisoprodol  (SOMA ) 250 MG tablet Take by mouth as needed.   famotidine  (PEPCID ) 20 MG tablet Take 1 tablet (20 mg total) by mouth daily.   fluticasone  (FLONASE ) 50 MCG/ACT nasal spray Place 1 spray into both nostrils daily as needed.    JANUVIA 100 MG tablet Take 100 mg by mouth daily.   levothyroxine  (SYNTHROID ) 75 MCG tablet Take 75 mcg by mouth daily.   Magnesium  500 MG CAPS Take 1,000 mg by mouth.   meloxicam  (MOBIC ) 15 MG tablet Take 1 tablet (15 mg total) by mouth daily. (Patient taking  differently: Take 15 mg by mouth 3 times/day as needed-between meals & bedtime for pain.)   metFORMIN  (GLUCOPHAGE ) 1000 MG tablet Take 1 tablet by mouth 2 (two) times daily.   Methylcellulose, Laxative, (CITRUCEL PO) Take by mouth.   Multiple Vitamin (MULTIVITAMIN) tablet Take 1 tablet by mouth daily.   Omega-3 Fatty Acids (OMEGA-3 FISH OIL PO) Take by mouth.   ONE TOUCH ULTRA TEST test strip USE 1 TO 2 TIMES A DAY   Probiotic Product (PROBIOTIC DAILY PO) Take 1 tablet by mouth daily.   [DISCONTINUED] atorvastatin  (LIPITOR) 10 MG tablet Take 10 mg by mouth daily.   [DISCONTINUED] clopidogrel  (PLAVIX ) 75 MG tablet Take 1 tablet by mouth daily.   [DISCONTINUED] losartan  (COZAAR ) 100 MG tablet Take 1 tablet by mouth daily.   [DISCONTINUED] metoprolol  succinate (TOPROL -XL) 50 MG 24 hr tablet Take 1 tablet by mouth 2 (two) times daily.     Review of Systems      All other systems reviewed and are otherwise negative except as noted above.  Physical Exam    VS:  BP (!) 170/90   Pulse (!) 54   Ht 6' (1.829 m)   Wt 243 lb 6.4 oz (110.4 kg)   SpO2 97%   BMI 33.01 kg/m  , BMI Body mass index is 33.01 kg/m.  Wt Readings from  Last 3 Encounters:  11/29/23 243 lb 6.4 oz (110.4 kg)  02/12/23 242 lb (109.8 kg)  01/15/23 238 lb 9.6 oz (108.2 kg)     GEN: Well nourished, well developed, in no acute distress. HEENT: normal. Neck: Supple, no JVD, carotid bruits, or masses. Cardiac: RRR, no murmurs, rubs, or gallops. No clubbing, cyanosis, edema.  Radials/PT 2+ and equal bilaterally.  Respiratory:  Respirations regular and unlabored, clear to auscultation bilaterally. GI: Soft, nontender, nondistended. MS: No deformity or atrophy. Skin: Warm and dry, no rash. Neuro:  Strength and sensation are intact. Psych: Normal affect.  Assessment & Plan    Difficulty with deep breaths periodic difficulty with deep breaths, more common in the evening. No pain or restriction. Chest X-ray was normal. Possible atelectasis. -Consider obtaining a spirometer to exercise lungs and promote better airflow. -Consider CT scan of the chest if symptoms worsen or become more frequent.  Hypertension Blood pressure slightly elevated at the visit, but generally well controlled on Losartan  100mg  daily and Metoprolol  50mg  twice daily. -Continue current regimen.  Hyperlipidemia On Atorvastatin  10mg  nightly. Recent LDL was 56, which is within goal. -Continue current regimen.  Diabetes Mellitus Recent Hemoglobin A1c was 7.9, indicating suboptimal control. Triglycerides were also elevated. Currently on Metformin  1000mg  twice daily and Januvia 100mg  daily -Continue current regimen and recheck labs in February.  Carotid Stenosis No current symptoms, but due for screening. -Order carotid ultrasound.  Coronary Artery Disease Status post stent placement in 2007. On Plavix  and Aspirin  81mg  daily for secondary prevention. -Continue current regimen. -Obtain EKG today for annual evaluation.  Right bundle branch block -stable     Disposition: Follow up 1 year with Oneil Parchment, MD or APP.  Signed, Orren LOISE Fabry, PA-C 11/29/2023, 10:13  AM [facility]

## 2023-11-29 ENCOUNTER — Ambulatory Visit: Payer: Federal, State, Local not specified - PPO | Attending: Physician Assistant | Admitting: Physician Assistant

## 2023-11-29 VITALS — BP 170/90 | HR 54 | Ht 72.0 in | Wt 243.4 lb

## 2023-11-29 DIAGNOSIS — I10 Essential (primary) hypertension: Secondary | ICD-10-CM

## 2023-11-29 DIAGNOSIS — I6523 Occlusion and stenosis of bilateral carotid arteries: Secondary | ICD-10-CM | POA: Diagnosis not present

## 2023-11-29 DIAGNOSIS — I4510 Unspecified right bundle-branch block: Secondary | ICD-10-CM | POA: Diagnosis not present

## 2023-11-29 DIAGNOSIS — E785 Hyperlipidemia, unspecified: Secondary | ICD-10-CM | POA: Diagnosis not present

## 2023-11-29 DIAGNOSIS — I2510 Atherosclerotic heart disease of native coronary artery without angina pectoris: Secondary | ICD-10-CM

## 2023-11-29 MED ORDER — CLOPIDOGREL BISULFATE 75 MG PO TABS: 75.0000 mg | ORAL_TABLET | Freq: Every day | ORAL | 3 refills | Status: DC

## 2023-11-29 MED ORDER — ATORVASTATIN CALCIUM 10 MG PO TABS: 10.0000 mg | ORAL_TABLET | Freq: Every day | ORAL | 3 refills | Status: DC

## 2023-11-29 MED ORDER — METOPROLOL SUCCINATE ER 50 MG PO TB24: 50.0000 mg | ORAL_TABLET | Freq: Two times a day (BID) | ORAL | 3 refills | Status: DC

## 2023-11-29 MED ORDER — LOSARTAN POTASSIUM 100 MG PO TABS: 100.0000 mg | ORAL_TABLET | Freq: Every day | ORAL | 3 refills | Status: DC

## 2023-11-29 NOTE — Patient Instructions (Signed)
 Medication Instructions:  Your physician recommends that you continue on your current medications as directed. Please refer to the Current Medication list given to you today.  *If you need a refill on your cardiac medications before your next appointment, please call your pharmacy*   Lab Work: None ordered  If you have labs (blood work) drawn today and your tests are completely normal, you will receive your results only by: MyChart Message (if you have MyChart) OR A paper copy in the mail If you have any lab test that is abnormal or we need to change your treatment, we will call you to review the results.   Testing/Procedures: Your physician has requested that you have a carotid duplex. This test is an ultrasound of the carotid arteries in your neck. It looks at blood flow through these arteries that supply the brain with blood. Allow one hour for this exam. There are no restrictions or special instructions.    Follow-Up: At [facility], you and your health needs are our priority.  As part of our continuing mission to provide you with exceptional heart care, we have created designated Provider Care Teams.  These Care Teams include your primary Cardiologist (physician) and Advanced Practice Providers (APPs -  Physician Assistants and Nurse Practitioners) who all work together to provide you with the care you need, when you need it.  We recommend signing up for the patient portal called MyChart.  Sign up information is provided on this After Visit Summary.  MyChart is used to connect with patients for Virtual Visits (Telemedicine).  Patients are able to view lab/test results, encounter notes, upcoming appointments, etc.  Non-urgent messages can be sent to your provider as well.   To learn more about what you can do with MyChart, go to [email or web address].    Your next appointment:   12 month(s)  Provider:   Oneil Parchment, MD     Other Instructions

## 2023-12-12 ENCOUNTER — Ambulatory Visit (HOSPITAL_COMMUNITY)
Admission: RE | Admit: 2023-12-12 | Discharge: 2023-12-12 | Disposition: A | Discharge status: Home / Self Care | Payer: Federal, State, Local not specified - PPO | Source: Ambulatory Visit | Attending: Internal Medicine | Admitting: Internal Medicine

## 2023-12-12 DIAGNOSIS — I6523 Occlusion and stenosis of bilateral carotid arteries: Secondary | ICD-10-CM

## 2024-01-08 ENCOUNTER — Encounter: Payer: Self-pay | Admitting: Podiatry

## 2024-01-08 ENCOUNTER — Ambulatory Visit (INDEPENDENT_AMBULATORY_CARE_PROVIDER_SITE_OTHER): Payer: Medicare Other | Admitting: Podiatry

## 2024-01-08 VITALS — Ht 72.0 in | Wt 243.0 lb

## 2024-01-08 DIAGNOSIS — E1142 Type 2 diabetes mellitus with diabetic polyneuropathy: Secondary | ICD-10-CM | POA: Diagnosis not present

## 2024-01-08 DIAGNOSIS — B351 Tinea unguium: Secondary | ICD-10-CM

## 2024-01-08 DIAGNOSIS — M2042 Other hammer toe(s) (acquired), left foot: Secondary | ICD-10-CM | POA: Diagnosis not present

## 2024-01-08 DIAGNOSIS — Z0189 Encounter for other specified special examinations: Secondary | ICD-10-CM

## 2024-01-08 DIAGNOSIS — M79675 Pain in left toe(s): Secondary | ICD-10-CM

## 2024-01-08 DIAGNOSIS — M79674 Pain in right toe(s): Secondary | ICD-10-CM | POA: Diagnosis not present

## 2024-01-08 DIAGNOSIS — E119 Type 2 diabetes mellitus without complications: Secondary | ICD-10-CM

## 2024-01-08 DIAGNOSIS — M2041 Other hammer toe(s) (acquired), right foot: Secondary | ICD-10-CM | POA: Diagnosis not present

## 2024-01-08 MED ORDER — NONFORMULARY OR COMPOUNDED ITEM: 5 refills | Status: DC

## 2024-01-14 ENCOUNTER — Inpatient Hospital Stay (HOSPITAL_COMMUNITY)
Admission: EM | Admit: 2024-01-14 | Discharge: 2024-01-26 | DRG: 233 | Disposition: A | Discharge status: Home Health | Payer: Medicare Other | Attending: Thoracic Surgery (Cardiothoracic Vascular Surgery) | Admitting: Thoracic Surgery (Cardiothoracic Vascular Surgery)

## 2024-01-14 ENCOUNTER — Emergency Department (HOSPITAL_COMMUNITY): Payer: Medicare Other

## 2024-01-14 ENCOUNTER — Encounter (HOSPITAL_COMMUNITY): Payer: Self-pay

## 2024-01-14 ENCOUNTER — Other Ambulatory Visit: Payer: Self-pay

## 2024-01-14 DIAGNOSIS — K219 Gastro-esophageal reflux disease without esophagitis: Secondary | ICD-10-CM | POA: Diagnosis present

## 2024-01-14 DIAGNOSIS — J101 Influenza due to other identified influenza virus with other respiratory manifestations: Secondary | ICD-10-CM | POA: Diagnosis present

## 2024-01-14 DIAGNOSIS — E1169 Type 2 diabetes mellitus with other specified complication: Secondary | ICD-10-CM | POA: Diagnosis present

## 2024-01-14 DIAGNOSIS — I959 Hypotension, unspecified: Secondary | ICD-10-CM | POA: Diagnosis not present

## 2024-01-14 DIAGNOSIS — Z7984 Long term (current) use of oral hypoglycemic drugs: Secondary | ICD-10-CM

## 2024-01-14 DIAGNOSIS — E782 Mixed hyperlipidemia: Secondary | ICD-10-CM | POA: Diagnosis present

## 2024-01-14 DIAGNOSIS — Z7982 Long term (current) use of aspirin: Secondary | ICD-10-CM

## 2024-01-14 DIAGNOSIS — Z96643 Presence of artificial hip joint, bilateral: Secondary | ICD-10-CM | POA: Diagnosis present

## 2024-01-14 DIAGNOSIS — I214 Non-ST elevation (NSTEMI) myocardial infarction: Secondary | ICD-10-CM | POA: Diagnosis not present

## 2024-01-14 DIAGNOSIS — R7989 Other specified abnormal findings of blood chemistry: Secondary | ICD-10-CM

## 2024-01-14 DIAGNOSIS — Z1152 Encounter for screening for COVID-19: Secondary | ICD-10-CM

## 2024-01-14 DIAGNOSIS — I4510 Unspecified right bundle-branch block: Secondary | ICD-10-CM | POA: Diagnosis present

## 2024-01-14 DIAGNOSIS — I4891 Unspecified atrial fibrillation: Secondary | ICD-10-CM | POA: Diagnosis not present

## 2024-01-14 DIAGNOSIS — E039 Hypothyroidism, unspecified: Secondary | ICD-10-CM | POA: Diagnosis present

## 2024-01-14 DIAGNOSIS — N179 Acute kidney failure, unspecified: Secondary | ICD-10-CM | POA: Diagnosis not present

## 2024-01-14 DIAGNOSIS — Z7902 Long term (current) use of antithrombotics/antiplatelets: Secondary | ICD-10-CM

## 2024-01-14 DIAGNOSIS — I4819 Other persistent atrial fibrillation: Secondary | ICD-10-CM | POA: Diagnosis present

## 2024-01-14 DIAGNOSIS — E872 Acidosis, unspecified: Secondary | ICD-10-CM | POA: Diagnosis present

## 2024-01-14 DIAGNOSIS — E119 Type 2 diabetes mellitus without complications: Secondary | ICD-10-CM

## 2024-01-14 DIAGNOSIS — E1122 Type 2 diabetes mellitus with diabetic chronic kidney disease: Secondary | ICD-10-CM | POA: Diagnosis present

## 2024-01-14 DIAGNOSIS — D65 Disseminated intravascular coagulation [defibrination syndrome]: Secondary | ICD-10-CM | POA: Diagnosis not present

## 2024-01-14 DIAGNOSIS — Z955 Presence of coronary angioplasty implant and graft: Secondary | ICD-10-CM

## 2024-01-14 DIAGNOSIS — Z7901 Long term (current) use of anticoagulants: Secondary | ICD-10-CM

## 2024-01-14 DIAGNOSIS — I152 Hypertension secondary to endocrine disorders: Secondary | ICD-10-CM | POA: Diagnosis present

## 2024-01-14 DIAGNOSIS — N1831 Chronic kidney disease, stage 3a: Secondary | ICD-10-CM | POA: Diagnosis present

## 2024-01-14 DIAGNOSIS — Z9889 Other specified postprocedural states: Secondary | ICD-10-CM

## 2024-01-14 DIAGNOSIS — Z951 Presence of aortocoronary bypass graft: Secondary | ICD-10-CM

## 2024-01-14 DIAGNOSIS — Z79899 Other long term (current) drug therapy: Secondary | ICD-10-CM

## 2024-01-14 DIAGNOSIS — Z8249 Family history of ischemic heart disease and other diseases of the circulatory system: Secondary | ICD-10-CM

## 2024-01-14 DIAGNOSIS — I050 Rheumatic mitral stenosis: Secondary | ICD-10-CM | POA: Diagnosis present

## 2024-01-14 DIAGNOSIS — I2510 Atherosclerotic heart disease of native coronary artery without angina pectoris: Secondary | ICD-10-CM | POA: Diagnosis present

## 2024-01-14 DIAGNOSIS — E1142 Type 2 diabetes mellitus with diabetic polyneuropathy: Secondary | ICD-10-CM | POA: Diagnosis present

## 2024-01-14 DIAGNOSIS — E1159 Type 2 diabetes mellitus with other circulatory complications: Secondary | ICD-10-CM | POA: Diagnosis present

## 2024-01-14 DIAGNOSIS — Z1371 Encounter for nonprocreative screening for genetic disease carrier status: Secondary | ICD-10-CM

## 2024-01-14 DIAGNOSIS — D62 Acute posthemorrhagic anemia: Secondary | ICD-10-CM | POA: Diagnosis not present

## 2024-01-14 DIAGNOSIS — Z96652 Presence of left artificial knee joint: Secondary | ICD-10-CM | POA: Diagnosis present

## 2024-01-14 DIAGNOSIS — E1165 Type 2 diabetes mellitus with hyperglycemia: Secondary | ICD-10-CM | POA: Diagnosis present

## 2024-01-14 DIAGNOSIS — Z79890 Hormone replacement therapy (postmenopausal): Secondary | ICD-10-CM

## 2024-01-14 DIAGNOSIS — Z801 Family history of malignant neoplasm of trachea, bronchus and lung: Secondary | ICD-10-CM

## 2024-01-14 LAB — URINALYSIS, ROUTINE W REFLEX MICROSCOPIC
Bilirubin Urine: NEGATIVE
Glucose, UA: NEGATIVE mg/dL
Hgb urine dipstick: NEGATIVE
Ketones, ur: NEGATIVE mg/dL
Leukocytes,Ua: NEGATIVE
Nitrite: NEGATIVE
Protein, ur: NEGATIVE mg/dL
Specific Gravity, Urine: 1.016 (ref 1.005–1.030)
pH: 5 (ref 5.0–8.0)

## 2024-01-14 LAB — CBC
HCT: 42.1 % (ref 39.0–52.0)
Hemoglobin: 14.2 g/dL (ref 13.0–17.0)
MCH: 30.7 pg (ref 26.0–34.0)
MCHC: 33.7 g/dL (ref 30.0–36.0)
MCV: 91.1 fL (ref 80.0–100.0)
Platelets: 202 10*3/uL (ref 150–400)
RBC: 4.62 MIL/uL (ref 4.22–5.81)
RDW: 13 % (ref 11.5–15.5)
WBC: 9.9 10*3/uL (ref 4.0–10.5)
nRBC: 0 % (ref 0.0–0.2)

## 2024-01-14 LAB — COMPREHENSIVE METABOLIC PANEL
ALT: 78 U/L — ABNORMAL HIGH (ref 0–44)
AST: 84 U/L — ABNORMAL HIGH (ref 15–41)
Albumin: 3.7 g/dL (ref 3.5–5.0)
Alkaline Phosphatase: 49 U/L (ref 38–126)
Anion gap: 11 (ref 5–15)
BUN: 30 mg/dL — ABNORMAL HIGH (ref 8–23)
CO2: 24 mmol/L (ref 22–32)
Calcium: 9.8 mg/dL (ref 8.9–10.3)
Chloride: 103 mmol/L (ref 98–111)
Creatinine, Ser: 1.48 mg/dL — ABNORMAL HIGH (ref 0.61–1.24)
GFR, Estimated: 47 mL/min — ABNORMAL LOW (ref >60)
Glucose, Bld: 184 mg/dL — ABNORMAL HIGH (ref 70–99)
Potassium: 4.4 mmol/L (ref 3.5–5.1)
Sodium: 138 mmol/L (ref 135–145)
Total Bilirubin: 0.8 mg/dL (ref 0.0–1.2)
Total Protein: 7 g/dL (ref 6.5–8.1)

## 2024-01-14 LAB — RESP PANEL BY RT-PCR (RSV, FLU A&B, COVID)  RVPGX2
Influenza A by PCR: POSITIVE — AB
Influenza B by PCR: NEGATIVE
Resp Syncytial Virus by PCR: NEGATIVE
SARS Coronavirus 2 by RT PCR: NEGATIVE

## 2024-01-14 LAB — PROTIME-INR
INR: 1 (ref 0.8–1.2)
Prothrombin Time: 13.7 s (ref 11.4–15.2)

## 2024-01-14 LAB — BRAIN NATRIURETIC PEPTIDE: B Natriuretic Peptide: 666.1 pg/mL — ABNORMAL HIGH (ref 0.0–100.0)

## 2024-01-14 LAB — TROPONIN I (HIGH SENSITIVITY)
Troponin I (High Sensitivity): 94 ng/L — ABNORMAL HIGH (ref <18)
Troponin I (High Sensitivity): 94 ng/L — ABNORMAL HIGH (ref <18)

## 2024-01-14 LAB — POC OCCULT BLOOD, ED: Fecal Occult Bld: NEGATIVE

## 2024-01-14 LAB — HEMOGLOBIN A1C
Hgb A1c MFr Bld: 7.1 % — ABNORMAL HIGH (ref 4.8–5.6)
Mean Plasma Glucose: 157.07 mg/dL

## 2024-01-14 LAB — APTT: aPTT: 25 s (ref 24–36)

## 2024-01-14 MED ORDER — ACETAMINOPHEN 500 MG PO TABS
1000.0000 mg | ORAL_TABLET | Freq: Once | ORAL | Status: AC
  Administered 2024-01-14: 1000 mg via ORAL
  Filled 2024-01-14: qty 2

## 2024-01-14 MED ORDER — ONDANSETRON HCL 4 MG/2ML IJ SOLN: 4.0000 mg | Freq: Four times a day (QID) | INTRAMUSCULAR | Status: DC | PRN

## 2024-01-14 MED ORDER — LOSARTAN POTASSIUM 50 MG PO TABS
100.0000 mg | ORAL_TABLET | Freq: Every day | ORAL | Status: DC
  Filled 2024-01-14 (×2): qty 2

## 2024-01-14 MED ORDER — SODIUM CHLORIDE 0.9% FLUSH
3.0000 mL | Freq: Two times a day (BID) | INTRAVENOUS | Status: DC
  Administered 2024-01-15 – 2024-01-19 (×9): 3 mL via INTRAVENOUS

## 2024-01-14 MED ORDER — HEPARIN (PORCINE) 25000 UT/250ML-% IV SOLN
1400.0000 [IU]/h | INTRAVENOUS | Status: DC
  Administered 2024-01-14: 1400 [IU]/h via INTRAVENOUS
  Filled 2024-01-14: qty 250

## 2024-01-14 MED ORDER — FAMOTIDINE 20 MG PO TABS: 20.0000 mg | ORAL_TABLET | Freq: Every day | ORAL | Status: DC

## 2024-01-14 MED ORDER — METOPROLOL TARTRATE 5 MG/5ML IV SOLN
5.0000 mg | INTRAVENOUS | Status: DC | PRN
  Administered 2024-01-15 – 2024-01-20 (×3): 5 mg via INTRAVENOUS
  Filled 2024-01-14 (×4): qty 5

## 2024-01-14 MED ORDER — ASPIRIN 81 MG PO TBEC
81.0000 mg | DELAYED_RELEASE_TABLET | Freq: Every day | ORAL | Status: DC
  Administered 2024-01-15 – 2024-01-20 (×7): 81 mg via ORAL
  Filled 2024-01-14 (×7): qty 1

## 2024-01-14 MED ORDER — ONDANSETRON HCL 4 MG PO TABS: 4.0000 mg | ORAL_TABLET | Freq: Four times a day (QID) | ORAL | Status: DC | PRN

## 2024-01-14 MED ORDER — CLOPIDOGREL BISULFATE 75 MG PO TABS
75.0000 mg | ORAL_TABLET | Freq: Every day | ORAL | Status: DC
  Administered 2024-01-15: 75 mg via ORAL
  Filled 2024-01-14: qty 1

## 2024-01-14 MED ORDER — METFORMIN HCL 500 MG PO TABS
1000.0000 mg | ORAL_TABLET | Freq: Two times a day (BID) | ORAL | Status: DC
  Administered 2024-01-14: 1000 mg via ORAL
  Filled 2024-01-14: qty 2

## 2024-01-14 MED ORDER — ACETAMINOPHEN 325 MG PO TABS: 650.0000 mg | ORAL_TABLET | Freq: Four times a day (QID) | ORAL | Status: DC | PRN

## 2024-01-14 MED ORDER — ATORVASTATIN CALCIUM 10 MG PO TABS
10.0000 mg | ORAL_TABLET | Freq: Every day | ORAL | Status: DC
  Administered 2024-01-14 – 2024-01-15 (×2): 10 mg via ORAL
  Filled 2024-01-14 (×2): qty 1

## 2024-01-14 MED ORDER — SENNOSIDES-DOCUSATE SODIUM 8.6-50 MG PO TABS: 1.0000 | ORAL_TABLET | Freq: Every evening | ORAL | Status: DC | PRN

## 2024-01-14 MED ORDER — INSULIN ASPART 100 UNIT/ML IJ SOLN
0.0000 [IU] | Freq: Three times a day (TID) | INTRAMUSCULAR | Status: DC
  Administered 2024-01-15 – 2024-01-16 (×5): 2 [IU] via SUBCUTANEOUS
  Administered 2024-01-17: 1 [IU] via SUBCUTANEOUS
  Administered 2024-01-17: 2 [IU] via SUBCUTANEOUS
  Administered 2024-01-17 – 2024-01-18 (×2): 1 [IU] via SUBCUTANEOUS
  Administered 2024-01-18: 2 [IU] via SUBCUTANEOUS
  Administered 2024-01-18: 1 [IU] via SUBCUTANEOUS
  Administered 2024-01-19: 2 [IU] via SUBCUTANEOUS
  Administered 2024-01-19 – 2024-01-20 (×3): 1 [IU] via SUBCUTANEOUS
  Administered 2024-01-20 (×2): 2 [IU] via SUBCUTANEOUS
  Administered 2024-01-21: 1 [IU] via SUBCUTANEOUS

## 2024-01-14 MED ORDER — LEVOTHYROXINE SODIUM 75 MCG PO TABS
75.0000 ug | ORAL_TABLET | Freq: Every day | ORAL | Status: DC
  Administered 2024-01-15 – 2024-01-26 (×11): 75 ug via ORAL
  Filled 2024-01-14 (×11): qty 1

## 2024-01-14 MED ORDER — METOPROLOL SUCCINATE ER 50 MG PO TB24
50.0000 mg | ORAL_TABLET | Freq: Two times a day (BID) | ORAL | Status: DC
  Administered 2024-01-14 – 2024-01-15 (×3): 50 mg via ORAL
  Filled 2024-01-14 (×2): qty 2
  Filled 2024-01-14: qty 1

## 2024-01-14 MED ORDER — ACETAMINOPHEN 650 MG RE SUPP: 650.0000 mg | Freq: Four times a day (QID) | RECTAL | Status: DC | PRN

## 2024-01-14 MED ORDER — LACTATED RINGERS IV SOLN: INTRAVENOUS | Status: DC

## 2024-01-14 NOTE — Consult Note (Addendum)
 Cardiology Consultation   Patient ID: [name] MRN: [id number]; DOB: [birth year]  Admit date: 01/14/2024 Date of Consult: 01/14/2024  PCP:  Eartha Inch, MD   [facility] Providers Cardiologist:  Donato Schultz, MD        Patient Profile:   Darin Mcclure is a 83 y.o. male with a hx of coronary artery disease status post drug-eluting stent to the RCA in 2007, hypertension, hyperlipidemia, type 2 diabetes, hypothyroidism, right bundle branch block, who is being seen 01/14/2024 for the evaluation of chest pain and atrial fibrillation at the request of the Emergency Department.  History of Present Illness:   Darin Mcclure states he was in his usual state of health until yesterday around noon.  He states he was climbing up steps when he developed sudden onset chest pain and shortness of breath.  His chest pain was nonradiating, worsened with exertion, improved with rest.  However, with rest his symptoms persisted into the next day.  This prompted an ED visit.  He denies syncope, palpitations, fever, nausea, vomiting, apnea, leg swelling.  Darin Mcclure has a history of coronary artery disease and hypertension, of which he follows Dr. Anne Fu.  He underwent a drug-eluting stent to the right coronary artery in 2007, and has been on aspirin and Plavix since.  He is mostly adherent with his regimen, however he has been required to intermittently hold his Plavix during prior surgeries.  He had a adenosine nuclear SPECT in 11/2014 which showed small inferior wall infarct.  He also had a transthoracic echocardiogram in 2002 which showed normal EF, normal RV function grade 2 diastolic dysfunction, mild MR and mild AR.  Other notable history include type 2 diabetes,  Darin Mcclure has no prior history of ECG documented atrial fibrillation.  Prior to his symptoms yesterday, he is not endorsed chest pain, shortness of breath, palpitations.  He has no family history of atrial fibrillation.  He lives with his  wife, who states he does snore.  His BMI is 32, and he does not exercise.  He is not a current tobacco user.  He is otherwise a well-functioning man.  Vitals include blood pressure 146/110, heart rate 73-1 02, SpO2 100% on room air.  Labs include K4.4, creatinine 1.48, troponin 94-> 94, BNP 666.1.  Positive for influenza A.    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) 08/26/2017   GERD (gastroesophageal reflux disease)    Heart disease    Hyperglycemia    Hypertension    Mixed hyperlipidemia    Muscle cramp 08/26/2017   Neuropathy    Osteoarthritis    RBBB    RBBB (right bundle branch block)    Type 2 diabetes mellitus (HCC)     Past Surgical History:  Procedure Laterality Date   ACHILLES TENDON REPAIR Right    BACK SURGERY     CORONARY ANGIOPLASTY     pror stent to mid RCA 10/04/2006   FOOT SURGERY     Cyst removal   INGUINAL HERNIA REPAIR     PERCUTANEOUS CORONARY STENT INTERVENTION (PCI-S)  09/2006   REPLACEMENT TOTAL KNEE Left    TOTAL HIP ARTHROPLASTY Right    TOTAL HIP ARTHROPLASTY Left      Home Medications:  Prior to Admission medications   Medication Sig Start Date  End Date Taking? Authorizing Provider  amoxicillin (AMOXIL) 500 MG capsule As needed for dental procedure 03/27/19   [provider]  aspirin EC 81 MG tablet Take 81 mg by mouth daily.    [provider]  atorvastatin (LIPITOR) 10 MG tablet Take 1 tablet (10 mg total) by mouth daily. 11/29/23   Sharlene Dory, PA-C  carisoprodol (SOMA) 250 MG tablet Take by mouth as needed. 04/19/21   [provider]  clopidogrel (PLAVIX) 75 MG tablet Take 1 tablet (75 mg total) by mouth daily. 11/29/23   Sharlene Dory, PA-C  famotidine (PEPCID) 20 MG tablet Take 1 tablet (20 mg total) by mouth daily.  02/05/22   Arnaldo Natal, NP  fluticasone (FLONASE) 50 MCG/ACT nasal spray Place 1 spray into both nostrils daily as needed.  08/08/17   [provider]  JANUVIA 100 MG tablet Take 100 mg by mouth daily. 08/19/21   [provider]  levothyroxine (SYNTHROID) 75 MCG tablet Take 75 mcg by mouth daily. 04/12/20   [provider]  losartan (COZAAR) 100 MG tablet Take 1 tablet (100 mg total) by mouth daily. 11/29/23   Sharlene Dory, PA-C  Magnesium 500 MG CAPS Take 1,000 mg by mouth.    [provider]  meloxicam (MOBIC) 15 MG tablet Take 1 tablet (15 mg total) by mouth daily. Patient taking differently: Take 15 mg by mouth 3 times/day as needed-between meals & bedtime for pain. 11/28/20   Felecia Shelling, DPM  metFORMIN (GLUCOPHAGE) 1000 MG tablet Take 1 tablet by mouth 2 (two) times daily. 04/30/22   [provider]  Methylcellulose, Laxative, (CITRUCEL PO) Take by mouth.    [provider]  metoprolol succinate (TOPROL-XL) 50 MG 24 hr tablet Take 1 tablet (50 mg total) by mouth 2 (two) times daily. 11/29/23   Sharlene Dory, PA-C  Multiple Vitamin (MULTIVITAMIN) tablet Take 1 tablet by mouth daily.    [provider]  NONFORMULARY OR COMPOUNDED ITEM Topical antifungal topical solution: terbinafine 3%, fluconazole 2%, tea tree oil 5%, Urea 10%, ibuprofen2% in DMSO suspension. Sig: Apply to the affected toenail(s) once daily.  Order sent to [facility] [address] [place], [place] [address] 01/08/24   [name], DPM  Omega-3 Fatty Acids (OMEGA-3 FISH OIL PO) Take by mouth.    [provider]  ONE TOUCH ULTRA TEST test strip USE 1 TO 2 TIMES A DAY 08/29/18   [provider]  Probiotic Product (PROBIOTIC DAILY PO) Take 1 tablet by mouth daily.    [provider]    Inpatient Medications: Scheduled Meds:  Continuous Infusions:  PRN Meds:   Allergies:   No Known Allergies  Social  History:   Social History   Socioeconomic History   Marital status: Single    Spouse name: Not on file   Number of children: 2   Years of education: Not on file   Highest education level: Not on file  Occupational History   Occupation: retired  Tobacco Use   Smoking status: Never   Smokeless tobacco: Never  Vaping Use   Vaping status: Never Used  Substance and Sexual Activity   Alcohol use: Yes    Comment: Rarely   Drug use: No   Sexual activity: Not on file  Other Topics Concern   Not on file  Social History Narrative   Retired as a Production designer, 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       Two Story Home    Right handed    Social Drivers of Health   Financial Resource Strain: Low Risk  (12/30/2023)   Received from [facility]   Overall Financial Resource Strain (CARDIA)    Difficulty of Paying Living Expenses: Not hard at all  Food Insecurity: No Food Insecurity (12/30/2023)   Received from [facility]   Hunger Vital Sign    Worried About Running Out of Food in the Last Year: Never true    Ran Out of Food in the Last Year: Never true  Transportation Needs: No Transportation Needs (12/30/2023)   Received from [facility] - Transportation    Lack of Transportation (Medical): No    Lack of Transportation (Non-Medical): No  Physical Activity: Insufficiently Active (07/31/2023)   Received from [facility]   Exercise Vital Sign    Days of Exercise per Week: 4 days    Minutes of Exercise per Session: 20 min  Stress: No Stress Concern Present (07/31/2023)   Received from [facility] - Occupational Stress Questionnaire    Feeling of Stress : Not at all  Social Connections: Socially Integrated (07/31/2023)   Received from [facility]   Social Network    How would you rate your social network (family, work, friends)?: Good participation  with social networks  Intimate Partner Violence: Not At Risk (07/31/2023)   Received from [facility]   HITS    Over the last 12 months how often did your partner physically hurt you?: Never    Over the last 12 months how often did your partner insult you or talk down to you?: Never    Over the last 12 months how often did your partner threaten you with physical harm?: Never    Over the last 12 months how often did your partner scream or curse at you?: Never    Family History:    Family History  Problem Relation Age of Onset   CAD Father    Lung cancer Father    Colon cancer Neg Hx    Esophageal cancer Neg Hx    Rectal cancer Neg Hx    Stomach cancer Neg Hx      ROS:  Please see the history of present illness.   All other ROS reviewed and negative.     Physical Exam/Data:   Vitals:   01/14/24 2100 01/14/24 2130 01/14/24 2200 01/14/24 2207  BP: (!) 148/88 138/81 (!) 147/85   Pulse: (!) 102 77 87   Resp: (!) 23 18 18    Temp:    97.8 F (36.6 C)  TempSrc:    Oral  SpO2: 97% 98% 92%    No intake or output data in the 24 hours ending 01/14/24 2209    01/08/2024   11:14 AM 11/29/2023    8:05 AM 02/12/2023    9:27 AM  Last 3 Weights  Weight (lbs) 243 lb 243 lb 6.4 oz 242 lb  Weight (kg) 110.224 kg 110.406 kg 109.77 kg     There is no height or weight on file to calculate BMI.  General:  Well nourished, well developed, in no acute distress\ HEENT: normal Neck: no JVD Vascular: No carotid bruits; Distal pulses 2+ bilaterally Cardiac:  irregular rate Lungs:  clear to auscultation bilaterally, no wheezing, rhonchi or rales  Abd: soft, nontender,  no hepatomegaly  Ext: no edema Musculoskeletal:  No deformities, BUE and BLE strength normal and equal Skin: warm and dry  Neuro:  CNs 2-12 intact, no focal abnormalities noted Psych:  Normal affect   EKG:  The EKG was personally reviewed and demonstrates:  atrial fibrillation Telemetry:  Telemetry was personally reviewed and  demonstrates:  atrial fibrillation  Relevant CV Studies:  12/2020 nuclear SPECT The left ventricular ejection fraction is hyperdynamic (>65%). Nuclear stress EF: 68%. No wall motion abnormality There was no ST segment deviation noted during stress. Defect 1: There is a small defect of mild severity present in the apex location. No ischemia identified. This is a low risk study.    TTE 12/2020   IMPRESSIONS     1. Left ventricular ejection fraction, by estimation, is 60 to 65%. The  left ventricle has normal function. The left ventricle has no regional  wall motion abnormalities. The left ventricular internal cavity size was  mildly dilated. Left ventricular  diastolic parameters are consistent with Grade II diastolic dysfunction  (pseudonormalization).   2. Right ventricular systolic function is normal. The right ventricular  size is normal. There is mildly elevated pulmonary artery systolic  pressure.   3. Left atrial size was mildly dilated.   4. The mitral valve is abnormal. Mild mitral valve regurgitation.  Moderate mitral annular calcification.   5. The aortic valve is abnormal. Aortic valve regurgitation is not  visualized. Mild aortic valve sclerosis is present, with no evidence of  aortic valve stenosis.   6. The inferior vena cava is normal in size with greater than 50%  respiratory variability, suggesting right atrial pressure of 3 mmHg.   Comparison(s): The left ventricular function is unchanged. Report only  02/28/16 EF 68%.   Laboratory Data:  High Sensitivity Troponin:   Recent Labs  Lab 01/14/24 1810 01/14/24 2023  TROPONINIHS 94* 94*     Chemistry Recent Labs  Lab 01/14/24 1817  NA 138  K 4.4  CL 103  CO2 24  GLUCOSE 184*  BUN 30*  CREATININE 1.48*  CALCIUM 9.8  GFRNONAA 47*  ANIONGAP 11    Recent Labs  Lab 01/14/24 1817  PROT 7.0  ALBUMIN 3.7  AST 84*  ALT 78*  ALKPHOS 49  BILITOT 0.8   Lipids No results for input(s): "CHOL",  "TRIG", "HDL", "LABVLDL", "LDLCALC", "CHOLHDL" in the last 168 hours.  Hematology Recent Labs  Lab 01/14/24 1810  WBC 9.9  RBC 4.62  HGB 14.2  HCT 42.1  MCV 91.1  MCH 30.7  MCHC 33.7  RDW 13.0  PLT 202   Thyroid No results for input(s): "TSH", "FREET4" in the last 168 hours.  BNP Recent Labs  Lab 01/14/24 1810  BNP 666.1*    DDimer No results for input(s): "DDIMER" in the last 168 hours.   Radiology/Studies:  DG Chest 2 View Result Date: 01/14/2024 CLINICAL DATA:  Intermittent chest pain and shortness of breath. EXAM: CHEST - 2 VIEW COMPARISON:  None Available. FINDINGS: The heart size and mediastinal contours are within normal limits. Aortic atherosclerosis. No focal consolidation, pleural effusion, or pneumothorax. No acute osseous abnormality. IMPRESSION: No acute cardiopulmonary findings. Electronically Signed   By: Hart Robinsons M.D.   On: 01/14/2024 19:36     Assessment and Plan:   Darin Mcclure is a 83 y.o. male with a hx of coronary artery disease status post drug-eluting stent to the RCA in 2007, hypertension, hyperlipidemia, type 2 diabetes, hypothyroidism, right bundle branch block,  who is being seen 01/14/2024 for the evaluation of chest pain and atrial fibrillation at the request of the Emergency Department.  Darin Mcclure presents today with cardiac chest pain, and was found to have new onset paroxysmal atrial fibrillation afterward on ECG.  He is euvolemic on exam.  He has evidence of chronic myocardial injury.  His chest pain worsens with exertion.  This is not quite typical for symptoms related to atrial fibrillation.  He does not have dynamic troponins, and his ECG does not show infarct or ischemia.  TIMI score of 5.  Given his history of coronary artery disease, as well as his advanced age and risk factors, he certainly warrants further evaluation of type I NSTEMI.  Regarding his atrial fibrillation, his ventricular rates are currently well-controlled.  His  CHA2DS2-VASc score is 5 (hypertension, age x 2, diabetes, vascular disease), correlates to a 7.2% annual risk of stroke.  Has bled score 2.  Factors of atrial fibrillation include age, hypertension, and possible obstructive sleep apnea (will need to be assessed in the outpatient setting).  His indication for rhythm control is debilitating symptoms.  Will start him on heparin for stroke reduction and treatment of ACS as above, and will consider pursuing electrical cardioversion prior to discharge.  Recommendations -admit to gen med -start IV heparin -continue aspirin and plavix -NPO at MN for possible LHC catheterization -consider electrical cardioversion (may not require TEE if symptoms occurred < 48hrs prior diagnosis) -TTE -A1C  -sleep study as outpatient -d/c meloxicam    Risk Assessment/Risk Scores:     TIMI Risk Score for Unstable Angina or Non-ST Elevation MI:   The patient's TIMI risk score is  , which indicates a  % risk of all cause mortality, new or recurrent myocardial infarction or need for urgent revascularization in the next 14 days.    CHA2DS2-VASc Score =     This indicates a  % annual risk of stroke. The patient's score is based upon:           For questions or updates, please contact [facility] Please consult [email or web address] for contact info under    Signed, Donavan Burnet, MD  01/14/2024 10:09 PM

## 2024-01-14 NOTE — ED Triage Notes (Signed)
 Pt BIB by EMS for reports of intermittent CP and SHOB while ambulating up the steps at home. Recent URI, finished oral steroids and antibiotics. Pt also reports epigastric tenderness and endorses dark-colored stools x 3. Pt ambulatory on arrival. On Plavix for previous cardiac stent.

## 2024-01-14 NOTE — Progress Notes (Addendum)
 ANTICOAGULATION CONSULT NOTE  Pharmacy Consult for Heparin Indication: chest pain/ACS and atrial fibrillation  No Known Allergies  Patient Measurements:   Heparin Dosing Weight: 101 kg  Vital Signs: Temp: 97.8 F (36.6 C) (02/18 2207) Temp Source: Oral (02/18 2207) BP: 147/85 (02/18 2200) Pulse Rate: 87 (02/18 2200)  Labs: Recent Labs    01/14/24 1810 01/14/24 1817 01/14/24 2023  HGB 14.2  --   --   HCT 42.1  --   --   PLT 202  --   --   CREATININE  --  1.48*  --   TROPONINIHS 94*  --  94*    Estimated Creatinine Clearance: 50.2 mL/min (A) (by C-G formula based on SCr of 1.48 mg/dL (H)).   Medical History: 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) 08/26/2017   GERD (gastroesophageal reflux disease)    Heart disease    Hyperglycemia    Hypertension    Mixed hyperlipidemia    Muscle cramp 08/26/2017   Neuropathy    Osteoarthritis    RBBB    RBBB (right bundle branch block)    Type 2 diabetes mellitus (HCC)     Assessment: 56 yom with a history of CAD s/p DES, HTN, HLD, T2DM, hypothyroidism, RBBB. Patient is presenting with chest pain and AF. Heparin per pharmacy consult placed for chest pain/ACS and atrial fibrillation.  Patient reporting dark stools. Hemocult negative, CBC ok.  Cardiology planning for possible LHC in AM.  Patient is not on anticoagulation prior to arrival.  Hgb 14.2; plt 202  Goal of Therapy:  Heparin level 0.3-0.7 units/ml Monitor platelets by anticoagulation protocol: Yes   Plan:  No initial heparin bolus (given reporting dark stools --- would probably give adjustment boluses if needed to for titration if no further concern for hemorrhage) Start heparin infusion at 1400 units/hr Check anti-Xa level in 8 hours and daily while on  heparin Continue to monitor H&H and platelets  Delmar Landau, PharmD, BCPS 01/14/2024 10:31 PM ED Clinical Pharmacist -  [phone]

## 2024-01-14 NOTE — H&P (Signed)
 History and Physical    Darin Mcclure ZOX:[id number] DOB: 04-04-1941 DOA: 01/14/2024  PCP: Eartha Inch, MD  Patient coming from: Home  I have personally briefly reviewed patient's old medical records in [facility]  Chief Complaint: Chest discomfort, shortness of breath  HPI: Andrea Colglazier is a 83 y.o. male with medical history significant for CAD s/p DES to mid RCA 2007, RBBB, T2DM, HTN, CKD stage IIIa, hypothyroidism, HLD who presented to the ED for evaluation of chest discomfort and shortness of breath.  Patient reports about 2 weeks ago he had a significant frequent cough.  He was treated for URI with a course of antibiotics followed by a 5-day course of prednisone which he completed yesterday.  Patient states that yesterday while climbing up a set of stairs he developed midsternal chest pain and shortness of breath.  Pain was nonradiating, worse with exertion, and improved with rest.  Pain has been persistent into today.  Today he also became very fatigued.  He denied any palpitations, syncope, fevers, chills, diaphoresis, nausea, vomiting, leg swelling.  Patient reports taking aspirin/Plavix every night.  He denies any obvious bleeding.  ED Course  Labs/Imaging on admission: I have personally reviewed following labs and imaging studies.  Initial vitals showed BP 146/110, pulse 88, RR 18, temp 97.6 F, SpO2 92% on room air.  Labs showed WBC 9.9, hemoglobin 14.2, platelets 202,000, sodium 138, potassium 4.4, bicarb 24, BUN 30, creatinine 1.48, serum glucose 184, AST 84, ALT 78, alk phos 49, total bilirubin 0.8.  Troponin 94 x 2, BNP 666.1.  UA negative for UTI.  FOBT negative.  Influenza A positive.  SARS-CoV-2 and RSV negative.  2 view chest x-ray negative for focal consolidation, edema, effusion.  EDP discussed with cardiology who will see in consultation and recommended medical admission.  Patient was started on IV heparin.  The hospitalist service was consulted to  admit.  Review of Systems: All systems reviewed and are negative except as documented in history of present illness above.   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) 08/26/2017   GERD (gastroesophageal reflux disease)    Heart disease    Hyperglycemia    Hypertension    Mixed hyperlipidemia    Muscle cramp 08/26/2017   Neuropathy    Osteoarthritis    RBBB    RBBB (right bundle branch block)    Type 2 diabetes mellitus (HCC)     Past Surgical History:  Procedure Laterality Date   ACHILLES TENDON REPAIR Right    BACK SURGERY     CORONARY ANGIOPLASTY     pror stent to mid RCA 10/04/2006   FOOT SURGERY     Cyst removal   INGUINAL HERNIA REPAIR     PERCUTANEOUS CORONARY STENT INTERVENTION (PCI-S)  09/2006   REPLACEMENT TOTAL KNEE Left    TOTAL HIP ARTHROPLASTY Right    TOTAL HIP ARTHROPLASTY Left     Social History:  reports that he has never smoked. He has never used smokeless tobacco. He reports current alcohol use. He reports that he does not use drugs.  No Known Allergies  Family History  Problem Relation Age of Onset   CAD Father    Lung cancer Father    Colon cancer Neg Hx    Esophageal cancer Neg Hx  Rectal cancer Neg Hx    Stomach cancer Neg Hx      Prior to Admission medications   Medication Sig Start Date End Date Taking? Authorizing Provider  amoxicillin (AMOXIL) 500 MG capsule As needed for dental procedure 03/27/19   [provider]  aspirin EC 81 MG tablet Take 81 mg by mouth daily.    [provider]  atorvastatin (LIPITOR) 10 MG tablet Take 1 tablet (10 mg total) by mouth daily. 11/29/23   Sharlene Dory, PA-C  carisoprodol (SOMA) 250 MG tablet Take by mouth as needed. 04/19/21   [provider]  clopidogrel (PLAVIX) 75 MG tablet  Take 1 tablet (75 mg total) by mouth daily. 11/29/23   Sharlene Dory, PA-C  famotidine (PEPCID) 20 MG tablet Take 1 tablet (20 mg total) by mouth daily. 02/05/22   Arnaldo Natal, NP  fluticasone (FLONASE) 50 MCG/ACT nasal spray Place 1 spray into both nostrils daily as needed.  08/08/17   [provider]  JANUVIA 100 MG tablet Take 100 mg by mouth daily. 08/19/21   [provider]  levothyroxine (SYNTHROID) 75 MCG tablet Take 75 mcg by mouth daily. 04/12/20   [provider]  losartan (COZAAR) 100 MG tablet Take 1 tablet (100 mg total) by mouth daily. 11/29/23   Sharlene Dory, PA-C  Magnesium 500 MG CAPS Take 1,000 mg by mouth.    [provider]  meloxicam (MOBIC) 15 MG tablet Take 1 tablet (15 mg total) by mouth daily. Patient taking differently: Take 15 mg by mouth 3 times/day as needed-between meals & bedtime for pain. 11/28/20   Felecia Shelling, DPM  metFORMIN (GLUCOPHAGE) 1000 MG tablet Take 1 tablet by mouth 2 (two) times daily. 04/30/22   [provider]  Methylcellulose, Laxative, (CITRUCEL PO) Take by mouth.    [provider]  metoprolol succinate (TOPROL-XL) 50 MG 24 hr tablet Take 1 tablet (50 mg total) by mouth 2 (two) times daily. 11/29/23   Sharlene Dory, PA-C  Multiple Vitamin (MULTIVITAMIN) tablet Take 1 tablet by mouth daily.    [provider]  NONFORMULARY OR COMPOUNDED ITEM Topical antifungal topical solution: terbinafine 3%, fluconazole 2%, tea tree oil 5%, Urea 10%, ibuprofen2% in DMSO suspension. Sig: Apply to the affected toenail(s) once daily.  Order sent to [facility] [address] [place], [place] [address] 01/08/24   [name], DPM  Omega-3 Fatty Acids (OMEGA-3 FISH OIL PO) Take by mouth.    [provider]  ONE TOUCH ULTRA TEST test strip USE 1 TO 2 TIMES A DAY 08/29/18   [provider]  Probiotic Product (PROBIOTIC DAILY PO) Take 1 tablet by mouth daily.     [provider]    Physical Exam: Vitals:   01/14/24 2200 01/14/24 2207 01/14/24 2230 01/14/24 2231  BP: (!) 147/85  125/84   Pulse: 87  99   Resp: 18  19   Temp:  97.8 F (36.6 C)    TempSrc:  Oral    SpO2: 92%  94%   Weight:    110.2 kg  Height:    6' (1.829 m)   Constitutional: Sitting up in bed, NAD, calm, comfortable Eyes: EOMI, lids and conjunctivae normal ENMT: Mucous membranes are moist. Posterior pharynx clear of any exudate or lesions.Normal dentition.  Neck: normal, supple, no masses. Respiratory: clear to auscultation bilaterally, no wheezing, no crackles. Normal respiratory effort. No accessory muscle use.  Cardiovascular: Irregularly irregular with  intermittent tachycardia to 150s on telemetry, no murmurs / rubs / gallops. No extremity edema. 2+ pedal pulses. Abdomen: no tenderness, no masses palpated.  Musculoskeletal: no clubbing / cyanosis. No joint deformity upper and lower extremities. Good ROM, no contractures. Normal muscle tone.  Skin: no rashes, lesions, ulcers. No induration Neurologic: Sensation intact. Strength 5/5 in all 4.  Psychiatric: Normal judgment and insight. Alert and oriented x 3. Normal mood.   EKG: Personally reviewed.  A-fib, rate 95, RBBB.  A-fib is new when compared to prior.  Assessment/Plan Principal Problem:   New onset atrial fibrillation (HCC) Active Problems:   Coronary artery disease involving native coronary artery of native heart without angina pectoris   Influenza A   Type 2 diabetes mellitus (HCC)   Hypertension associated with diabetes (HCC)   Chronic kidney disease, stage 3a (HCC)   Hypothyroidism   Hyperlipidemia associated with type 2 diabetes mellitus (HCC)   Lenzy Kerschner is a 83 y.o. male with medical history significant for CAD s/p DES to mid RCA 2007, RBBB, T2DM, HTN, CKD stage IIIa, hypothyroidism, HLD who is admitted with new onset A-fib.  Assessment and Plan: New onset atrial fibrillation: Present  on arrival.  Initially her heart rate controlled but having intermittent episodes of RVR with HR up to 150s.  No prior known history of A-fib.  CHA2DS2-VASc score is at least 5. -Cardiology following -Started on IV heparin -Echocardiogram ordered -Check TSH, magnesium -Continue Toprol-XL 50 mg BID  CAD s/p DES to RCA 2007: Patient with central nonradiating chest pain.  Troponin 94 x 2.  Cardiology following and considering left heart cath. -Continue IV heparin -Continue aspirin/Plavix per cardiology -N.p.o. after midnight -Continue atorvastatin  Influenza A: Potentially provoking A-fib otherwise not really having many flulike symptoms.  CXR without evidence of pneumonia.  Saturating well on room air and afebrile.  Hypertension: Continue losartan and Toprol-XL.  Type 2 diabetes: Hold metformin (he was given a dose in the ED prior to admit) and Januvia.  Placed on SSI.  CKD stage IIIa: Creatinine 1.48 on admission, similar to prior.  Will place on gentle IV fluid hydration overnight given potential need for left heart cath.  Hypothyroidism: Continue Synthroid.  Hyperlipidemia: Continue atorvastatin.   DVT prophylaxis: IV heparin Code Status: Full code, confirmed with patient on admission Family Communication: Discussed with patient, he has discussed with family Disposition Plan: From home and likely discharge to home pending clinical progress Consults called: Cardiology Severity of Illness: The appropriate patient status for this patient is OBSERVATION. Observation status is judged to be reasonable and necessary in order to provide the required intensity of service to ensure the patient's safety. The patient's presenting symptoms, physical exam findings, and initial radiographic and laboratory data in the context of their medical condition is felt to place them at decreased risk for further clinical deterioration. Furthermore, it is anticipated that the patient will be medically  stable for discharge from the hospital within 2 midnights of admission.   Darreld Mclean MD [facility]  If 7PM-7AM, please contact night-coverage [email or web address]  01/15/2024, 12:06 AM

## 2024-01-14 NOTE — H&P (Incomplete)
 History and Physical    Darin Mcclure BJY:[id number] DOB: Sep 11, 1941 DOA: 01/14/2024  PCP: Eartha Inch, MD  Patient coming from: Home  I have personally briefly reviewed patient's old medical records in [facility] Link  Chief Complaint: Chest discomfort, shortness of breath  HPI: Darin Mcclure is a 83 y.o. male with medical history significant for CAD s/p DES to mid RCA 2007, RBBB, T2DM, HTN, CKD stage IIIa, hypothyroidism, HLD who presented to the ED for evaluation of chest discomfort and shortness of breath.  Patient reports about 2 weeks ago he had a significant frequent cough.  He was treated for URI with a course of antibiotics followed by a 5-day course of prednisone which he completed yesterday.  Patient states that yesterday while climbing up a set of stairs he developed midsternal chest pain and shortness of breath.  Pain was nonradiating, worse with exertion, and improved with rest.  Pain has been persistent into today.  Today he also became very fatigued.  He denied any palpitations, syncope, fevers, chills, diaphoresis, nausea, vomiting, leg swelling.  Patient reports taking aspirin/Plavix every night.  He denies any obvious bleeding.  ED Course  Labs/Imaging on admission: I have personally reviewed following labs and imaging studies.  Initial vitals showed BP 146/110, pulse 88, RR 18, temp 97.6 F, SpO2 92% on room air.  Labs showed WBC 9.9, hemoglobin 14.2, platelets 202,000, sodium 138, potassium 4.4, bicarb 24, BUN 30, creatinine 1.48, serum glucose 184, AST 84, ALT 78, alk phos 49, total bilirubin 0.8.  Troponin 94 x 2, BNP 666.1.  UA negative for UTI.  FOBT negative.  Influenza A positive.  SARS-CoV-2 and RSV negative.  2 view chest x-ray negative for focal consolidation, edema, effusion.  EDP discussed with cardiology who will see in consultation and recommended medical admission.  Patient was started on IV heparin.  The hospitalist service was consulted to  admit.  Review of Systems: All systems reviewed and are negative except as documented in history of present illness above.   Past Medical History:  Diagnosis Date  . Allergy   . Blood transfusion without reported diagnosis   . Coronary artery disease    cath/stent 2007  . 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) 08/26/2017  . GERD (gastroesophageal reflux disease)   . Heart disease   . Hyperglycemia   . Hypertension   . Mixed hyperlipidemia   . Muscle cramp 08/26/2017  . Neuropathy   . Osteoarthritis   . RBBB   . RBBB (right bundle branch block)   . Type 2 diabetes mellitus (HCC)     Past Surgical History:  Procedure Laterality Date  . ACHILLES TENDON REPAIR Right   . BACK SURGERY    . CORONARY ANGIOPLASTY     pror stent to mid RCA 10/04/2006  . FOOT SURGERY     Cyst removal  . INGUINAL HERNIA REPAIR    . PERCUTANEOUS CORONARY STENT INTERVENTION (PCI-S)  09/2006  . REPLACEMENT TOTAL KNEE Left   . TOTAL HIP ARTHROPLASTY Right   . TOTAL HIP ARTHROPLASTY Left     Social History:  reports that he has never smoked. He has never used smokeless tobacco. He reports current alcohol use. He reports that he does not use drugs.  No Known Allergies  Family History  Problem Relation Age of Onset  . CAD Father   . Lung cancer Father   . Colon cancer Neg Hx   . Esophageal cancer Neg Hx   .  Rectal cancer Neg Hx   . Stomach cancer Neg Hx      Prior to Admission medications   Medication Sig Start Date End Date Taking? Authorizing Provider  amoxicillin (AMOXIL) 500 MG capsule As needed for dental procedure 03/27/19   [provider]  aspirin EC 81 MG tablet Take 81 mg by mouth daily.    [provider]  atorvastatin (LIPITOR) 10 MG tablet Take 1 tablet (10 mg total) by mouth daily. 11/29/23   Sharlene Dory, PA-C  carisoprodol (SOMA) 250 MG tablet Take by mouth as needed. 04/19/21   [provider]   clopidogrel (PLAVIX) 75 MG tablet Take 1 tablet (75 mg total) by mouth daily. 11/29/23   Sharlene Dory, PA-C  famotidine (PEPCID) 20 MG tablet Take 1 tablet (20 mg total) by mouth daily. 02/05/22   Arnaldo Natal, NP  fluticasone (FLONASE) 50 MCG/ACT nasal spray Place 1 spray into both nostrils daily as needed.  08/08/17   [provider]  JANUVIA 100 MG tablet Take 100 mg by mouth daily. 08/19/21   [provider]  levothyroxine (SYNTHROID) 75 MCG tablet Take 75 mcg by mouth daily. 04/12/20   [provider]  losartan (COZAAR) 100 MG tablet Take 1 tablet (100 mg total) by mouth daily. 11/29/23   Sharlene Dory, PA-C  Magnesium 500 MG CAPS Take 1,000 mg by mouth.    [provider]  meloxicam (MOBIC) 15 MG tablet Take 1 tablet (15 mg total) by mouth daily. Patient taking differently: Take 15 mg by mouth 3 times/day as needed-between meals & bedtime for pain. 11/28/20   Felecia Shelling, DPM  metFORMIN (GLUCOPHAGE) 1000 MG tablet Take 1 tablet by mouth 2 (two) times daily. 04/30/22   [provider]  Methylcellulose, Laxative, (CITRUCEL PO) Take by mouth.    [provider]  metoprolol succinate (TOPROL-XL) 50 MG 24 hr tablet Take 1 tablet (50 mg total) by mouth 2 (two) times daily. 11/29/23   Sharlene Dory, PA-C  Multiple Vitamin (MULTIVITAMIN) tablet Take 1 tablet by mouth daily.    [provider]  NONFORMULARY OR COMPOUNDED ITEM Topical antifungal topical solution: terbinafine 3%, fluconazole 2%, tea tree oil 5%, Urea 10%, ibuprofen2% in DMSO suspension. Sig: Apply to the affected toenail(s) once daily.  Order sent to [facility] [address] [place], [place] [address] 01/08/24   [name], DPM  Omega-3 Fatty Acids (OMEGA-3 FISH OIL PO) Take by mouth.    [provider]  ONE TOUCH ULTRA TEST test strip USE 1 TO 2 TIMES A DAY 08/29/18   [provider]  Probiotic Product (PROBIOTIC DAILY PO)  Take 1 tablet by mouth daily.    [provider]    Physical Exam: Vitals:   01/14/24 2200 01/14/24 2207 01/14/24 2230 01/14/24 2231  BP: (!) 147/85  125/84   Pulse: 87  99   Resp: 18  19   Temp:  97.8 F (36.6 C)    TempSrc:  Oral    SpO2: 92%  94%   Weight:    110.2 kg  Height:    6' (1.829 m)   Constitutional: Sitting up in bed, NAD, calm, comfortable Eyes: EOMI, lids and conjunctivae normal ENMT: Mucous membranes are moist. Posterior pharynx clear of any exudate or lesions.Normal dentition.  Neck: normal, supple, no masses. Respiratory: clear to auscultation bilaterally, no wheezing, no crackles. Normal respiratory effort. No accessory muscle use.  Cardiovascular: Irregularly irregular with  intermittent tachycardia to 150s on telemetry, no murmurs / rubs / gallops. No extremity edema. 2+ pedal pulses. Abdomen: no tenderness, no masses palpated.  Musculoskeletal: no clubbing / cyanosis. No joint deformity upper and lower extremities. Good ROM, no contractures. Normal muscle tone.  Skin: no rashes, lesions, ulcers. No induration Neurologic: Sensation intact. Strength 5/5 in all 4.  Psychiatric: Normal judgment and insight. Alert and oriented x 3. Normal mood.   EKG: Personally reviewed.  A-fib, rate 95, RBBB.  A-fib is new when compared to prior.  Assessment/Plan Principal Problem:   New onset atrial fibrillation (HCC) Active Problems:   Coronary artery disease involving native coronary artery of native heart without angina pectoris   Influenza A   Type 2 diabetes mellitus (HCC)   Hypertension associated with diabetes (HCC)   Chronic kidney disease, stage 3a (HCC)   Hypothyroidism   Hyperlipidemia associated with type 2 diabetes mellitus (HCC)   Darin Mcclure is a 83 y.o. male with medical history significant for CAD s/p DES to mid RCA 2007, RBBB, T2DM, HTN, CKD stage IIIa, hypothyroidism, HLD who is admitted with new onset A-fib. *** Assessment and Plan: New  onset atrial fibrillation: Present on arrival.  Initially her heart rate controlled but having intermittent episodes of RVR with HR up to 150s.  No prior known history of A-fib.  CHA2DS2-VASc score is at least 5. -Cardiology following -Started on IV heparin -Echocardiogram ordered -Check TSH, magnesium -Continue Toprol-XL 50 mg BID  CAD s/p DES to RCA 2007: Patient with central nonradiating chest pain.  Troponin 94 x 2.  Cardiology following and considering left heart cath. -Continue IV heparin -Continue aspirin/Plavix per cardiology -N.p.o. after midnight -Continue atorvastatin  Influenza A: Potentially provoking A-fib otherwise not really having many flulike symptoms.  CXR without evidence of pneumonia.  Saturating well on room air and afebrile.  Hypertension: ***  Type 2 diabetes: ***  CKD stage IIIa: ***  Hypothyroidism: ***  Hyperlipidemia: ***    DVT prophylaxis: ***  Code Status: ***  Family Communication: ***  Disposition Plan: ***  Consults called: Cardiology Severity of Illness: {Observation/Inpatient:21159}  Darreld Mclean MD [facility]  If 7PM-7AM, please contact night-coverage [email or web address]  01/15/2024, 12:00 AM

## 2024-01-14 NOTE — ED Provider Notes (Signed)
 [place] EMERGENCY DEPARTMENT AT [facility] Provider Note   CSN: [id number] Arrival date & time: 01/14/24  1753     History  Chief Complaint  Patient presents with   Shortness of Breath    Darin Mcclure is a 83 y.o. male with history of CAD with drug-eluting stent placed in 2007 currently on Plavix and aspirin, hyperlipidemia, right bundle blanch block, diabetes, hypertension, presents with concern for feelings of presyncope and shortness of breath particularly with exertion over the past 2 days.  Also reports a pressure sensation on his chest for the past 2 days.  Denies any pleuritic chest pain or pain radiating to his back.  Denies any new pain or swelling in his legs.  States this started after finishing a course of steroids that were prescribed by him by his PCP for viral URI.  Also reports 3 dark-colored stools yesterday.  No bright red blood in stools.   Shortness of Breath      Home Medications Prior to Admission medications   Medication Sig Start Date End Date Taking? Authorizing Provider  amoxicillin (AMOXIL) 500 MG capsule As needed for dental procedure 03/27/19   [provider]  aspirin EC 81 MG tablet Take 81 mg by mouth daily.    [provider]  atorvastatin (LIPITOR) 10 MG tablet Take 1 tablet (10 mg total) by mouth daily. 11/29/23   Sharlene Dory, PA-C  carisoprodol (SOMA) 250 MG tablet Take by mouth as needed. 04/19/21   [provider]  clopidogrel (PLAVIX) 75 MG tablet Take 1 tablet (75 mg total) by mouth daily. 11/29/23   Sharlene Dory, PA-C  famotidine (PEPCID) 20 MG tablet Take 1 tablet (20 mg total) by mouth daily. 02/05/22   Arnaldo Natal, NP  fluticasone (FLONASE) 50 MCG/ACT nasal spray Place 1 spray into both nostrils daily as needed.  08/08/17   [provider]  JANUVIA 100 MG tablet Take 100 mg by mouth daily. 08/19/21   [provider]  levothyroxine (SYNTHROID) 75 MCG tablet Take 75 mcg by  mouth daily. 04/12/20   [provider]  losartan (COZAAR) 100 MG tablet Take 1 tablet (100 mg total) by mouth daily. 11/29/23   Sharlene Dory, PA-C  Magnesium 500 MG CAPS Take 1,000 mg by mouth.    [provider]  meloxicam (MOBIC) 15 MG tablet Take 1 tablet (15 mg total) by mouth daily. Patient taking differently: Take 15 mg by mouth 3 times/day as needed-between meals & bedtime for pain. 11/28/20   Felecia Shelling, DPM  metFORMIN (GLUCOPHAGE) 1000 MG tablet Take 1 tablet by mouth 2 (two) times daily. 04/30/22   [provider]  Methylcellulose, Laxative, (CITRUCEL PO) Take by mouth.    [provider]  metoprolol succinate (TOPROL-XL) 50 MG 24 hr tablet Take 1 tablet (50 mg total) by mouth 2 (two) times daily. 11/29/23   Sharlene Dory, PA-C  Multiple Vitamin (MULTIVITAMIN) tablet Take 1 tablet by mouth daily.    [provider]  NONFORMULARY OR COMPOUNDED ITEM Topical antifungal topical solution: terbinafine 3%, fluconazole 2%, tea tree oil 5%, Urea 10%, ibuprofen2% in DMSO suspension. Sig: Apply to the affected toenail(s) once daily.  Order sent to [facility] [address] [place], [place] [address] 01/08/24   [name], DPM  Omega-3 Fatty Acids (OMEGA-3 FISH OIL PO) Take by mouth.    [provider]  ONE TOUCH ULTRA TEST test strip USE 1 TO 2  TIMES A DAY 08/29/18   [provider]  Probiotic Product (PROBIOTIC DAILY PO) Take 1 tablet by mouth daily.    [provider]      Allergies    Patient has no known allergies.    Review of Systems   Review of Systems  Respiratory:  Positive for shortness of breath.     Physical Exam Updated Vital Signs BP (!) 147/85   Pulse 87   Temp 97.8 F (36.6 C) (Oral)   Resp 18   Ht 6' (1.829 m)   Wt 110.2 kg   SpO2 92%   BMI 32.95 kg/m  Physical Exam Vitals and nursing note reviewed.  Constitutional:      General: He is not in acute distress.     Appearance: He is well-developed.  HENT:     Head: Normocephalic and atraumatic.  Eyes:     Conjunctiva/sclera: Conjunctivae normal.  Cardiovascular:     Rate and Rhythm: Normal rate. Rhythm irregular.     Heart sounds: No murmur heard.    Comments: 2+ radial pulse bilaterally 2+ dorsalis pedis pulse bilaterally Pulmonary:     Effort: Pulmonary effort is normal. No respiratory distress.     Breath sounds: Normal breath sounds.  Abdominal:     Palpations: Abdomen is soft.     Tenderness: There is no abdominal tenderness.  Musculoskeletal:        General: No swelling.     Cervical back: Neck supple.     Right lower leg: No edema.     Left lower leg: No edema.  Skin:    General: Skin is warm and dry.     Capillary Refill: Capillary refill takes less than 2 seconds.  Neurological:     Mental Status: He is alert.  Psychiatric:        Mood and Affect: Mood normal.     ED Results / Procedures / Treatments   Labs (all labs ordered are listed, but only abnormal results are displayed) Labs Reviewed  RESP PANEL BY RT-PCR (RSV, FLU A&B, COVID)  RVPGX2 - Abnormal; Notable for the following components:      Result Value   Influenza A by PCR POSITIVE (*)    All other components within normal limits  BRAIN NATRIURETIC PEPTIDE - Abnormal; Notable for the following components:   B Natriuretic Peptide 666.1 (*)    All other components within normal limits  COMPREHENSIVE METABOLIC PANEL - Abnormal; Notable for the following components:   Glucose, Bld 184 (*)    BUN 30 (*)    Creatinine, Ser 1.48 (*)    AST 84 (*)    ALT 78 (*)    GFR, Estimated 47 (*)    All other components within normal limits  TROPONIN I (HIGH SENSITIVITY) - Abnormal; Notable for the following components:   Troponin I (High Sensitivity) 94 (*)    All other components within normal limits  TROPONIN I (HIGH SENSITIVITY) - Abnormal; Notable for the following components:   Troponin I (High Sensitivity) 94 (*)    All  other components within normal limits  CBC  URINALYSIS, ROUTINE W REFLEX MICROSCOPIC  PROTIME-INR  APTT  HEMOGLOBIN A1C  POC OCCULT BLOOD, ED    EKG EKG Interpretation Date/Time:  Tuesday January 14 2024 18:10:44 EST Ventricular Rate:  91 PR Interval:    QRS Duration:  147 QT Interval:  377 QTC Calculation: 464 R Axis:   84  Text Interpretation: Atrial fibrillation Right bundle branch  block Confirmed by Benjiman Core [phone] on 01/14/2024 6:22:15 PM  Radiology DG Chest 2 View Result Date: 01/14/2024 CLINICAL DATA:  Intermittent chest pain and shortness of breath. EXAM: CHEST - 2 VIEW COMPARISON:  None Available. FINDINGS: The heart size and mediastinal contours are within normal limits. Aortic atherosclerosis. No focal consolidation, pleural effusion, or pneumothorax. No acute osseous abnormality. IMPRESSION: No acute cardiopulmonary findings. Electronically Signed   By: Hart Robinsons M.D.   On: 01/14/2024 19:36    Procedures Procedures    Medications Ordered in ED Medications  acetaminophen (TYLENOL) tablet 1,000 mg (has no administration in time range)  atorvastatin (LIPITOR) tablet 10 mg (has no administration in time range)  losartan (COZAAR) tablet 100 mg (has no administration in time range)  metFORMIN (GLUCOPHAGE) tablet 1,000 mg (has no administration in time range)  metoprolol succinate (TOPROL-XL) 24 hr tablet 50 mg (has no administration in time range)    ED Course/ Medical Decision Making/ A&P Clinical Course as of 01/14/24 2232  Tue Jan 14, 2024  2223 Unsure when the hospitalist will be able to admit patient.  Patient takes his medications at night.  I ordered his home metoprolol, losartan, metformin, and atorvastatin he takes nightly [AF]    Clinical Course User Index [AF] Arabella Merles, PA-C                                 Medical Decision Making Amount and/or Complexity of Data Reviewed Labs: ordered. Radiology:  ordered.     Differential diagnosis includes but is not limited to ACS, arrhythmia, aortic aneurysm, pericarditis, myocarditis, pericardial effusion, cardiac tamponade, musculoskeletal pain, GERD, Boerhaave's syndrome, DVT/PE, pneumonia, pleural effusion, COVID, flu, RSV, viral URI, GI bleed   ED Course:  Patient overall very well-appearing upon arrival, stable vital signs aside from a slightly elevated blood pressure of 148/88.  Reporting chest discomfort, shortness of breath, particularly with exertion over the past 2 days.  Unsure if this was related to finishing his prednisone taper yesterday. Atrial fibrillation noted on the EKG, this seems to be new onset for the patient.  He did see his cardiologist on 11/29/2023 and EKG there was with normal sinus rhythm, he did have a known right bundle blanch block. I Ordered, and personally interpreted labs.  The pertinent results include:   Initial troponin elevated at 94, and remained stable at 94 at repeat BNP elevated at 666 CMP with elevated creatinine at 1.48, but this appears to be possibly at baseline.  Last creatinine 1 year ago at 1.44.  Mild elevation in AST and ALT at 84 and 78 respectively.  No elevation in T. bili or alk phos.  No electrolyte abnormalities. CBC within normal limits Influenza A positive Hemoccult negative Urinalysis without signs of UTI Given no ST changes and troponin remaining flat, low concern for ACS at this time.  Feel patient's symptoms likely due to new onset A-fib. Elevated troponin likely due to increased demand on heart as patient's baseline heart rate appears to be in the 50s, now with heart rate in the 80s and 90s. Rates currently controlled, no A-fib with RVR.  He also has elevated BNP, but unknown what his baseline is.  He does not have any lower extremity swelling or signs of fluid overload on exam, I think heart failure less likely as contributing to his shortness of breath and chest discomfort, but would  likely benefit from inpatient echo.  Chest  x-ray unremarkable.  Low concern for PE at this time given no pleuritic chest pain, he is chronically anticoagulated.  Low concern for dissection at this time given symptoms ongoing for 2 days and he is hemodynamically stable, no radiation of pain to the back.  He also tested positive for influenza A.  Reported viral URI symptoms over the past couple weeks, but no acute worsening.  Denies any fevers or chills, reports feeling better than he was.  No indication for Tamiflu or further treatment at this time. He reported dark stools yesterday, but Hemoccult negative, hemoglobin within normal limits, low concern for any GI bleed at this time.  Patient reported mild headache, Tylenol ordered.  He also states he takes his medications nightly.  I have ordered his home metoprolol, atorvastatin, metformin, and losartan for him.  He also normally takes his Plavix at night, but since patient is being started on heparin per cardiology, will hold Plavix right now.  Consulted with cardiology, they recommended bringing patient into the medicine service and will plan to see him in consult.  They recommended starting him on heparin.  Consulted with hospitalist Dr. Allena Katz, he will plan to see patient and plan on admission for further workup.  Impression: New onset A-fib Elevated Troponin Elevated BNP Influenza A Elevated creatinine  Disposition:  Admission with hospitalist Dr. Allena Katz  Imaging Studies ordered: I ordered imaging studies including chest x-ray I independently visualized the imaging with scope of interpretation limited to determining acute life threatening conditions related to emergency care. Imaging showed no acute findings I agree with the radiologist interpretation   Cardiac Monitoring: / EKG: The patient was maintained on a cardiac monitor.  I personally viewed and interpreted the cardiac monitored which showed an underlying rhythm of: Atrial  fibrillation with right bundle branch block   Consultations Obtained: I requested consultation with the hospitalist Dr. Allena Katz,  and discussed lab and imaging findings as well as pertinent plan - they recommend: Admission I requested consultation with the cardiology team,  and discussed lab and imaging findings as well as pertinent plan - they recommend: They will see patient in consult, recommend starting patient on heparin  External records from outside source obtained and reviewed including cardiology note from 11/29/2023 patient was seen and had a normal sinus rhythm with right bundle branch on his EKG.  Was being maintained on metoprolol twice daily and losartan 100 mg daily for hypertension.  Was being continued on aspirin 81 mg daily and Plavix for CAD and his stent              Final Clinical Impression(s) / ED Diagnoses Final diagnoses:  Elevated troponin  New onset a-fib (HCC)  Elevated brain natriuretic peptide (BNP) level  Influenza A    Rx / DC Orders ED Discharge Orders     None         Arabella Merles, Cordelia Poche 01/14/24 2232    Benjiman Core, MD 01/14/24 [phone]

## 2024-01-15 ENCOUNTER — Encounter (HOSPITAL_COMMUNITY)
Admission: EM | Disposition: A | Discharge status: Home Health | Payer: Self-pay | Source: Home / Self Care | Attending: Internal Medicine

## 2024-01-15 ENCOUNTER — Encounter (HOSPITAL_COMMUNITY): Payer: Self-pay | Admitting: Cardiovascular Disease

## 2024-01-15 ENCOUNTER — Observation Stay (HOSPITAL_BASED_OUTPATIENT_CLINIC_OR_DEPARTMENT_OTHER): Payer: Medicare Other

## 2024-01-15 DIAGNOSIS — R739 Hyperglycemia, unspecified: Secondary | ICD-10-CM | POA: Diagnosis not present

## 2024-01-15 DIAGNOSIS — R42 Dizziness and giddiness: Secondary | ICD-10-CM | POA: Diagnosis not present

## 2024-01-15 DIAGNOSIS — E1159 Type 2 diabetes mellitus with other circulatory complications: Secondary | ICD-10-CM | POA: Diagnosis not present

## 2024-01-15 DIAGNOSIS — D62 Acute posthemorrhagic anemia: Secondary | ICD-10-CM | POA: Diagnosis not present

## 2024-01-15 DIAGNOSIS — N1831 Chronic kidney disease, stage 3a: Secondary | ICD-10-CM | POA: Diagnosis present

## 2024-01-15 DIAGNOSIS — Z79899 Other long term (current) drug therapy: Secondary | ICD-10-CM | POA: Diagnosis not present

## 2024-01-15 DIAGNOSIS — I200 Unstable angina: Secondary | ICD-10-CM | POA: Diagnosis not present

## 2024-01-15 DIAGNOSIS — Z1152 Encounter for screening for COVID-19: Secondary | ICD-10-CM | POA: Diagnosis not present

## 2024-01-15 DIAGNOSIS — N179 Acute kidney failure, unspecified: Secondary | ICD-10-CM | POA: Diagnosis not present

## 2024-01-15 DIAGNOSIS — E782 Mixed hyperlipidemia: Secondary | ICD-10-CM | POA: Diagnosis present

## 2024-01-15 DIAGNOSIS — I2510 Atherosclerotic heart disease of native coronary artery without angina pectoris: Secondary | ICD-10-CM | POA: Diagnosis present

## 2024-01-15 DIAGNOSIS — J101 Influenza due to other identified influenza virus with other respiratory manifestations: Secondary | ICD-10-CM | POA: Diagnosis not present

## 2024-01-15 DIAGNOSIS — I050 Rheumatic mitral stenosis: Secondary | ICD-10-CM | POA: Diagnosis present

## 2024-01-15 DIAGNOSIS — E1169 Type 2 diabetes mellitus with other specified complication: Secondary | ICD-10-CM | POA: Diagnosis present

## 2024-01-15 DIAGNOSIS — I4819 Other persistent atrial fibrillation: Secondary | ICD-10-CM | POA: Diagnosis present

## 2024-01-15 DIAGNOSIS — R7989 Other specified abnormal findings of blood chemistry: Secondary | ICD-10-CM | POA: Diagnosis not present

## 2024-01-15 DIAGNOSIS — Z951 Presence of aortocoronary bypass graft: Secondary | ICD-10-CM | POA: Diagnosis not present

## 2024-01-15 DIAGNOSIS — Z7984 Long term (current) use of oral hypoglycemic drugs: Secondary | ICD-10-CM | POA: Diagnosis not present

## 2024-01-15 DIAGNOSIS — I4891 Unspecified atrial fibrillation: Secondary | ICD-10-CM | POA: Diagnosis not present

## 2024-01-15 DIAGNOSIS — E039 Hypothyroidism, unspecified: Secondary | ICD-10-CM | POA: Diagnosis present

## 2024-01-15 DIAGNOSIS — K219 Gastro-esophageal reflux disease without esophagitis: Secondary | ICD-10-CM | POA: Diagnosis present

## 2024-01-15 DIAGNOSIS — I214 Non-ST elevation (NSTEMI) myocardial infarction: Secondary | ICD-10-CM

## 2024-01-15 DIAGNOSIS — Z79890 Hormone replacement therapy (postmenopausal): Secondary | ICD-10-CM | POA: Diagnosis not present

## 2024-01-15 DIAGNOSIS — Z7901 Long term (current) use of anticoagulants: Secondary | ICD-10-CM | POA: Diagnosis not present

## 2024-01-15 DIAGNOSIS — E1122 Type 2 diabetes mellitus with diabetic chronic kidney disease: Secondary | ICD-10-CM | POA: Diagnosis present

## 2024-01-15 DIAGNOSIS — E1165 Type 2 diabetes mellitus with hyperglycemia: Secondary | ICD-10-CM | POA: Diagnosis present

## 2024-01-15 DIAGNOSIS — E872 Acidosis, unspecified: Secondary | ICD-10-CM | POA: Diagnosis present

## 2024-01-15 DIAGNOSIS — Z8249 Family history of ischemic heart disease and other diseases of the circulatory system: Secondary | ICD-10-CM | POA: Diagnosis not present

## 2024-01-15 DIAGNOSIS — E1142 Type 2 diabetes mellitus with diabetic polyneuropathy: Secondary | ICD-10-CM | POA: Diagnosis present

## 2024-01-15 DIAGNOSIS — I152 Hypertension secondary to endocrine disorders: Secondary | ICD-10-CM | POA: Diagnosis present

## 2024-01-15 DIAGNOSIS — Z955 Presence of coronary angioplasty implant and graft: Secondary | ICD-10-CM | POA: Diagnosis not present

## 2024-01-15 DIAGNOSIS — D65 Disseminated intravascular coagulation [defibrination syndrome]: Secondary | ICD-10-CM | POA: Diagnosis not present

## 2024-01-15 DIAGNOSIS — I2489 Other forms of acute ischemic heart disease: Secondary | ICD-10-CM | POA: Diagnosis not present

## 2024-01-15 HISTORY — PX: LEFT HEART CATH AND CORONARY ANGIOGRAPHY: CATH118249

## 2024-01-15 LAB — ECHOCARDIOGRAM COMPLETE
AR max vel: 3.14 cm2
AV Peak grad: 4.8 mm[Hg]
Ao pk vel: 1.09 m/s
Area-P 1/2: 2.56 cm2
Height: 72 in
MV VTI: 2.45 cm2
S' Lateral: 3.4 cm
Weight: 3887.15 [oz_av]

## 2024-01-15 LAB — COMPREHENSIVE METABOLIC PANEL
ALT: 60 U/L — ABNORMAL HIGH (ref 0–44)
AST: 43 U/L — ABNORMAL HIGH (ref 15–41)
Albumin: 3.3 g/dL — ABNORMAL LOW (ref 3.5–5.0)
Alkaline Phosphatase: 39 U/L (ref 38–126)
Anion gap: 12 (ref 5–15)
BUN: 28 mg/dL — ABNORMAL HIGH (ref 8–23)
CO2: 21 mmol/L — ABNORMAL LOW (ref 22–32)
Calcium: 9.2 mg/dL (ref 8.9–10.3)
Chloride: 102 mmol/L (ref 98–111)
Creatinine, Ser: 1.23 mg/dL (ref 0.61–1.24)
GFR, Estimated: 59 mL/min — ABNORMAL LOW (ref >60)
Glucose, Bld: 154 mg/dL — ABNORMAL HIGH (ref 70–99)
Potassium: 4.4 mmol/L (ref 3.5–5.1)
Sodium: 135 mmol/L (ref 135–145)
Total Bilirubin: 0.9 mg/dL (ref 0.0–1.2)
Total Protein: 6.2 g/dL — ABNORMAL LOW (ref 6.5–8.1)

## 2024-01-15 LAB — SEDIMENTATION RATE: Sed Rate: 2 mm/h (ref 0–16)

## 2024-01-15 LAB — CBC
HCT: 40 % (ref 39.0–52.0)
Hemoglobin: 13.3 g/dL (ref 13.0–17.0)
MCH: 30.6 pg (ref 26.0–34.0)
MCHC: 33.3 g/dL (ref 30.0–36.0)
MCV: 92 fL (ref 80.0–100.0)
Platelets: 187 10*3/uL (ref 150–400)
RBC: 4.35 MIL/uL (ref 4.22–5.81)
RDW: 13.2 % (ref 11.5–15.5)
WBC: 8.4 10*3/uL (ref 4.0–10.5)
nRBC: 0 % (ref 0.0–0.2)

## 2024-01-15 LAB — PROCALCITONIN: Procalcitonin: <0.1 ng/mL

## 2024-01-15 LAB — C-REACTIVE PROTEIN: CRP: <0.5 mg/dL (ref <1.0)

## 2024-01-15 LAB — MRSA NEXT GEN BY PCR, NASAL: MRSA by PCR Next Gen: NOT DETECTED

## 2024-01-15 LAB — HEPARIN LEVEL (UNFRACTIONATED): Heparin Unfractionated: 0.35 [IU]/mL (ref 0.30–0.70)

## 2024-01-15 LAB — MAGNESIUM: Magnesium: 1.8 mg/dL (ref 1.7–2.4)

## 2024-01-15 LAB — GLUCOSE, CAPILLARY
Glucose-Capillary: 164 mg/dL — ABNORMAL HIGH (ref 70–99)
Glucose-Capillary: 161 mg/dL — ABNORMAL HIGH (ref 70–99)
Glucose-Capillary: 141 mg/dL — ABNORMAL HIGH (ref 70–99)

## 2024-01-15 LAB — TSH: TSH: 3.144 u[IU]/mL (ref 0.350–4.500)

## 2024-01-15 SURGERY — LEFT HEART CATH AND CORONARY ANGIOGRAPHY
Anesthesia: LOCAL

## 2024-01-15 MED ORDER — MIDAZOLAM HCL 2 MG/2ML IJ SOLN
INTRAMUSCULAR | Status: DC | PRN
  Administered 2024-01-15: 1 mg via INTRAVENOUS

## 2024-01-15 MED ORDER — HEPARIN (PORCINE) 25000 UT/250ML-% IV SOLN
1400.0000 [IU]/h | INTRAVENOUS | Status: DC
Start: 2024-01-15 — End: 2024-01-16
  Administered 2024-01-15: 1400 [IU]/h via INTRAVENOUS
  Filled 2024-01-15: qty 250

## 2024-01-15 MED ORDER — SODIUM CHLORIDE 0.9% FLUSH
3.0000 mL | Freq: Two times a day (BID) | INTRAVENOUS | Status: DC
Start: 2024-01-15 — End: 2024-01-21
  Administered 2024-01-16 – 2024-01-20 (×7): 3 mL via INTRAVENOUS

## 2024-01-15 MED ORDER — FENTANYL CITRATE (PF) 100 MCG/2ML IJ SOLN
INTRAMUSCULAR | Status: AC
  Filled 2024-01-15: qty 2

## 2024-01-15 MED ORDER — FAMOTIDINE 20 MG PO TABS: 20.0000 mg | ORAL_TABLET | Freq: Every day | ORAL | Status: DC | PRN

## 2024-01-15 MED ORDER — SODIUM CHLORIDE 0.9% FLUSH: 3.0000 mL | INTRAVENOUS | Status: DC | PRN

## 2024-01-15 MED ORDER — IOHEXOL 350 MG/ML SOLN
INTRAVENOUS | Status: DC | PRN
  Administered 2024-01-15: 40 mL

## 2024-01-15 MED ORDER — SODIUM CHLORIDE 0.9 % IV SOLN: INTRAVENOUS | Status: AC

## 2024-01-15 MED ORDER — VERAPAMIL HCL 2.5 MG/ML IV SOLN
INTRAVENOUS | Status: DC | PRN
  Administered 2024-01-15: 10 mL via INTRA_ARTERIAL

## 2024-01-15 MED ORDER — SODIUM CHLORIDE 0.9 % IV SOLN: 250.0000 mL | INTRAVENOUS | Status: AC | PRN

## 2024-01-15 MED ORDER — VERAPAMIL HCL 2.5 MG/ML IV SOLN
INTRAVENOUS | Status: AC
  Filled 2024-01-15: qty 2

## 2024-01-15 MED ORDER — LIDOCAINE HCL (PF) 1 % IJ SOLN
INTRAMUSCULAR | Status: DC | PRN
  Administered 2024-01-15: 2 mL via INTRADERMAL

## 2024-01-15 MED ORDER — ORAL CARE MOUTH RINSE: 15.0000 mL | OROMUCOSAL | Status: DC | PRN

## 2024-01-15 MED ORDER — MIDAZOLAM HCL 2 MG/2ML IJ SOLN
INTRAMUSCULAR | Status: AC
  Filled 2024-01-15: qty 2

## 2024-01-15 MED ORDER — HEPARIN (PORCINE) IN NACL 1000-0.9 UT/500ML-% IV SOLN
INTRAVENOUS | Status: DC | PRN
  Administered 2024-01-15 (×2): 500 mL

## 2024-01-15 MED ORDER — HEPARIN SODIUM (PORCINE) 1000 UNIT/ML IJ SOLN
INTRAMUSCULAR | Status: AC
  Filled 2024-01-15: qty 10

## 2024-01-15 MED ORDER — HEPARIN SODIUM (PORCINE) 1000 UNIT/ML IJ SOLN
INTRAMUSCULAR | Status: DC | PRN
  Administered 2024-01-15: 5000 [IU] via INTRAVENOUS

## 2024-01-15 MED ORDER — HYDRALAZINE HCL 20 MG/ML IJ SOLN: 10.0000 mg | INTRAMUSCULAR | Status: AC | PRN

## 2024-01-15 MED ORDER — LABETALOL HCL 5 MG/ML IV SOLN: 10.0000 mg | INTRAVENOUS | Status: AC | PRN

## 2024-01-15 MED ORDER — FENTANYL CITRATE (PF) 100 MCG/2ML IJ SOLN
INTRAMUSCULAR | Status: DC | PRN
  Administered 2024-01-15: 25 ug via INTRAVENOUS

## 2024-01-15 MED ORDER — LIDOCAINE HCL (PF) 1 % IJ SOLN
INTRAMUSCULAR | Status: AC
  Filled 2024-01-15: qty 30

## 2024-01-15 MED ORDER — FUROSEMIDE 10 MG/ML IJ SOLN
40.0000 mg | Freq: Once | INTRAMUSCULAR | Status: AC
  Administered 2024-01-15: 40 mg via INTRAVENOUS
  Filled 2024-01-15: qty 4

## 2024-01-15 SURGICAL SUPPLY — 7 items
CATH 5FR JL3.5 JR4 ANG PIG MP (CATHETERS) IMPLANT
DEVICE RAD COMP TR BAND LRG (VASCULAR PRODUCTS) IMPLANT
GLIDESHEATH SLEND SS 6F .021 (SHEATH) IMPLANT
GUIDEWIRE INQWIRE 1.5J.035X260 (WIRE) IMPLANT
INQWIRE 1.5J .035X260CM (WIRE) ×1 IMPLANT
PACK CARDIAC CATHETERIZATION (CUSTOM PROCEDURE TRAY) ×1 IMPLANT
SET ATX-X65L (MISCELLANEOUS) IMPLANT

## 2024-01-15 NOTE — Progress Notes (Signed)
 ANTICOAGULATION CONSULT NOTE  Pharmacy Consult for Heparin Indication: chest pain/ACS and atrial fibrillation  No Known Allergies  Patient Measurements: Height: 6' (182.9 cm) Weight: 110.2 kg (242 lb 15.2 oz) IBW/kg (Calculated) : 77.6 Heparin Dosing Weight: 101 kg  Vital Signs: Temp: 97.4 F (36.3 C) (02/19 0850) Temp Source: Oral (02/19 0713) BP: 154/98 (02/19 0915) Pulse Rate: 88 (02/19 0915)  Labs: Recent Labs    01/14/24 1810 01/14/24 1817 01/14/24 2023 01/14/24 2233 01/15/24 0447  HGB 14.2  --   --   --  13.3  HCT 42.1  --   --   --  40.0  PLT 202  --   --   --  187  APTT  --   --   --  25  --   LABPROT  --   --   --  13.7  --   INR  --   --   --  1.0  --   HEPARINUNFRC  --   --   --   --  0.35  CREATININE  --  1.48*  --   --  1.23  TROPONINIHS 94*  --  94*  --   --     Estimated Creatinine Clearance: 60.4 mL/min (by C-G formula based on SCr of 1.23 mg/dL).   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 Patient is presenting with chest pain and AF. Heparin per pharmacy consult placed for chest pain/ACS and atrial fibrillation.  Patient reporting dark stools. Hemocult negative, CBC ok.  Cardiology planning for possible LHC  Initial heparin level therapeutic on 1400 units/hr, no overt s/sx of bleeding per RN.   Goal of Therapy:  Heparin level 0.3-0.7 units/ml Monitor platelets by  anticoagulation protocol: Yes   Plan:  Continue heparin infusion at 1400 units/hr Check anti-Xa level in 8 hours and daily while on heparin Continue to monitor H&H and platelets  Ruben Im, PharmD Clinical Pharmacist 01/15/2024 10:31 AM Please check AMION for all [facility] numbers

## 2024-01-15 NOTE — Interval H&P Note (Signed)
 History and Physical Interval Note:  01/15/2024 2:32 PM  Darin Mcclure  has presented today for surgery, with the diagnosis of nstemi.  The various methods of treatment have been discussed with the patient and family. After consideration of risks, benefits and other options for treatment, the patient has consented to  Procedure(s): LEFT HEART CATH AND CORONARY ANGIOGRAPHY (N/A) as a surgical intervention.  The patient's history has been reviewed, patient examined, no change in status, stable for surgery.  I have reviewed the patient's chart and labs.  Questions were answered to the patient's satisfaction.    Cath Lab Visit (complete for each Cath Lab visit)  Clinical Evaluation Leading to the Procedure:   ACS: Yes.    Non-ACS:    Anginal Classification: CCS III  Anti-ischemic medical therapy: Minimal Therapy (1 class of medications)  Non-Invasive Test Results: No non-invasive testing performed  Prior CABG: No previous CABG    Verne Carrow

## 2024-01-15 NOTE — Progress Notes (Addendum)
 Cardiology Progress Note  Patient ID: Darin Mcclure MRN: [id number] DOB: May 06, 1941 Date of Encounter: 01/15/2024 Primary Cardiologist: Darin Schultz, MD  Subjective   Chief Complaint: SOB  HPI: Reports shortness of breath and chest discomfort.  Troponins are flat.  EKG shows A-fib with right bundle branch block.  BNP elevated.  He does have increased interstitial markings on chest x-ray on my review.  Echo is pending.  ROS:  All other ROS reviewed and negative. Pertinent positives noted in the HPI.     Telemetry  Overnight telemetry shows A-fib heart rate 80 to 90 bpm, which I personally reviewed.     Physical Exam   Vitals:   01/15/24 0130 01/15/24 0411 01/15/24 0630 01/15/24 0713  BP: 115/82 110/81 (!) 143/104   Pulse: 98 89 79   Resp: 12 18 16    Temp:  97.6 F (36.4 C)  98.1 F (36.7 C)  TempSrc:  Oral  Oral  SpO2: 94% 100% 100%   Weight:      Height:       No intake or output data in the 24 hours ending 01/15/24 0828     01/14/2024   10:31 PM 01/08/2024   11:14 AM 11/29/2023    8:05 AM  Last 3 Weights  Weight (lbs) 242 lb 15.2 oz 243 lb 243 lb 6.4 oz  Weight (kg) 110.2 kg 110.224 kg 110.406 kg    Body mass index is 32.95 kg/m.  General: Well nourished, well developed, in no acute distress Head: Atraumatic, normal size  Eyes: PEERLA, EOMI  Neck: Supple, no JVD Endocrine: No thryomegaly Cardiac: Normal S1, S2; irregular rhythm Lungs: Crackles at the lung bases Abd: Soft, nontender, no hepatomegaly  Ext: No edema, pulses 2+ Musculoskeletal: No deformities, BUE and BLE strength normal and equal Skin: Warm and dry, no rashes   Neuro: Alert and oriented to person, place, time, and situation, CNII-XII grossly intact, no focal deficits  Psych: Normal mood and affect   Cardiac Studies  TTE 01/12/2021  1. Left ventricular ejection fraction, by estimation, is 60 to 65%. The  left ventricle has normal function. The left ventricle has no regional  wall motion  abnormalities. The left ventricular internal cavity size was  mildly dilated. Left ventricular  diastolic parameters are consistent with Grade II diastolic dysfunction  (pseudonormalization).   2. Right ventricular systolic function is normal. The right ventricular  size is normal. There is mildly elevated pulmonary artery systolic  pressure.   3. Left atrial size was mildly dilated.   4. The mitral valve is abnormal. Mild mitral valve regurgitation.  Moderate mitral annular calcification.   5. The aortic valve is abnormal. Aortic valve regurgitation is not  visualized. Mild aortic valve sclerosis is present, with no evidence of  aortic valve stenosis.   6. The inferior vena cava is normal in size with greater than 50%  respiratory variability, suggesting right atrial pressure of 3 mmHg.   Patient Profile  Darin Mcclure is a 83 y.o. male with CAD status post PCI to the RCA in 2007, hypertension, hyperlipidemia, diabetes, right bundle branch block who was admitted on 01/14/2024 for new onset A-fib shortness of breath and chest pain.  Found to have influenza and concerns for congestive heart failure.  Assessment & Plan   # Shortness of breath # Influenza A # Concerns for congestive heart failure # Elevated troponin, possible demand # History of CAD -Describes having cough and congestion for 2 weeks.  Was eventually prescribed prednisone by  primary care physician.  Stopped prednisone on Monday and then felt very poor.  Reports fatigue low energy and shortness of breath.  Developed chest pressure last night. -Still short of breath.  X-ray on my review shows interstitial edema.  Elevated BNP 666. -Troponins are elevated but flat. -Suspect this could be a demand process.  We will give him Lasix 40 mg IV as a one-time dose.  We will obtain an echo.  He can eat today.  I have ordered procalcitonin, ESR and CRP.  We will also order procalcitonin. -I think he will need a left heart catheterization  but I would like to pursue further testing.  Discussed this with the son and the patient.  We can tentatively plan for left heart cath tomorrow. -Additionally this could be something we end up managing medically. -Continue aspirin and heparin drip.  I am going to hold Plavix. -Okay to continue metoprolol succinate 50 mg daily. -We will see how he does today. -We could alternatively treat this medically.  I did discuss this with the patient.  Given influenza A positivity we will see how he does today. -Continue high intensity statin.  Repeat lipids.  # Persistent atrial fibrillation -Likely complication of flu.  Would recommend rate control strategy for now.  Echo is pending. -Continue metoprolol succinate 50 mg twice daily. -Continue heparin drip.  Likely will need left heart cath.  Consideration for outpatient cardioversion when she recovers from fluid.  # Hypertension -Continue home losartan 100 mg daily metoprolol succinate 50 mg twice daily.  For questions or updates, please contact [facility] Please consult [email or web address] for contact info under    Signed, Gerri Spore T. Flora Lipps, MD, [facility]  [facility]  01/15/2024 8:28 AM   Addendum: Echo shows normal LV function with no regional wall motion abnormalities.  BNP is elevated but given reassuring echo that shows no severe systolic dysfunction or RV failure concerning for possible PE we will proceed with left heart catheterization today.  Risk and benefits explained to the patient.  He is willing to proceed.  Informed Consent   Shared Decision Making/Informed Consent The risks [stroke (1 in 1000), death (1 in 1000), kidney failure [usually temporary] (1 in 500), bleeding (1 in 200), allergic reaction [possibly serious] (1 in 200)], benefits (diagnostic support and management of coronary artery disease) and alternatives of a cardiac catheterization were discussed in detail with Darin Mcclure and he is willing to  proceed.     Gerri Spore T. Flora Lipps, MD, [facility]  [facility]  [address], [address] [place], [place] [address] [phone]  11:16 AM

## 2024-01-15 NOTE — Progress Notes (Signed)
 PROGRESS NOTE    Karlon Schlafer  WUJ:[id number] DOB: Aug 14, 1941 DOA: 01/14/2024 PCP: Eartha Inch, MD   Brief Narrative: 83 year old with past medical history significant for CAD status post DES to mid RCA 2007, RBBB, diabetes type 2, hypertension, CKD stage IIIa, hypothyroidism, hyperlipidemia presented for evaluation of chest discomfort and shortness of breath.  2 weeks ago he was treated for upper respiratory infection sinusitis for cough.  He completed course of antibiotics and a course of prednisone.  The day prior to admission he developed chest pain on exertion.  Presented for further evaluation  BNP elevated on admission at 666, troponin at 94 x 2.  Influenza A positive.  Chest x-ray negative for consolidation edema or effusion.  Cardiology was consulted for A-fib and RVR and for their evaluation of chest pain.  Plan is for heart cath today.   Assessment & Plan:   Principal Problem:   New onset atrial fibrillation (HCC) Active Problems:   Coronary artery disease involving native coronary artery of native heart without angina pectoris   Influenza A   Type 2 diabetes mellitus (HCC)   Hypertension associated with diabetes (HCC)   Chronic kidney disease, stage 3a (HCC)   Hypothyroidism   Hyperlipidemia associated with type 2 diabetes mellitus (HCC)   1-new onset of A-fib with RVR -Patient with heart rate in the 150 irregular. -Was a started on heparin drip -Continue metoprolol 50 mg twice daily -IV as needed metoprolol -Cardiology Following  -ECHO: Action fraction 65 to 70%, no wall motion abnormality.  Right ventricular systolic function is normal.  Left ventricular diastolic parameters are indeterminate.   2-Chest pain History of CAD status post DES to RCA 2007 Troponin mildly elevated at 90 Continue with a statin, monitor liver function test Cardiology planning heart cath this afternoon  Influenza A Denies worsening cough, chest x-ray negative for pneumonia. He  reports influenza-like symptoms 2 weeks ago, of note 2 weeks ago he was tested negative. Pro-calcitonin  less than 10, will hold on starting antibiotics at this time  Hypertension: Continue losartan and Toprol  Diabetes type 2: Continue to hold metformin and Januvia.  Sliding scale insulin of note he did received dose of metformin last night in the  ED.  Hypothyroidism: Continue with Synthroid  Lipidemia: Continue with atorvastatin   Estimated body mass index is 32.95 kg/m as calculated from the following:   Height as of this encounter: 6' (1.829 m).   Weight as of this encounter: 110.2 kg.   DVT prophylaxis: Heparin gtt Code Status: full code Family Communication: Son who was at bedside.  Disposition Plan:  Status is: Observation The patient remains OBS appropriate and will d/c before 2 midnights.    Consultants:  Cardiology   Procedures:  ECHO  Antimicrobials:    Subjective: Seen in the ED, denies worsening cough. He did had chest pain and dyspnea since Monday   Objective: Vitals:   01/15/24 0130 01/15/24 0411 01/15/24 0630 01/15/24 0713  BP: 115/82 110/81 (!) 143/104   Pulse: 98 89 79   Resp: 12 18 16    Temp:  97.6 F (36.4 C)  98.1 F (36.7 C)  TempSrc:  Oral  Oral  SpO2: 94% 100% 100%   Weight:      Height:       No intake or output data in the 24 hours ending 01/15/24 0826 Filed Weights   01/14/24 2231  Weight: 110.2 kg    Examination:  General exam: Appears calm and comfortable  Respiratory system: Respiratory effort normal. Crackles at bases.  Cardiovascular system: S1 & S2 heard, RRR. No JVD, murmurs, rubs, gallops or clicks. No pedal edema. Gastrointestinal system: Abdomen is nondistended, soft and nontender. No organomegaly or masses felt. Normal bowel sounds heard. Central nervous system: Alert and oriented. Extremities: Symmetric 5 x 5 power.   Data Reviewed: I have personally reviewed following labs and imaging studies  CBC: Recent  Labs  Lab 01/14/24 1810 01/15/24 0447  WBC 9.9 8.4  HGB 14.2 13.3  HCT 42.1 40.0  MCV 91.1 92.0  PLT 202 187   Basic Metabolic Panel: Recent Labs  Lab 01/14/24 1817 01/15/24 0447  NA 138 135  K 4.4 4.4  CL 103 102  CO2 24 21*  GLUCOSE 184* 154*  BUN 30* 28*  CREATININE 1.48* 1.23  CALCIUM 9.8 9.2  MG  --  1.8   GFR: Estimated Creatinine Clearance: 60.4 mL/min (by C-G formula based on SCr of 1.23 mg/dL). Liver Function Tests: Recent Labs  Lab 01/14/24 1817 01/15/24 0447  AST 84* 43*  ALT 78* 60*  ALKPHOS 49 39  BILITOT 0.8 0.9  PROT 7.0 6.2*  ALBUMIN 3.7 3.3*   No results for input(s): "LIPASE", "AMYLASE" in the last 168 hours. No results for input(s): "AMMONIA" in the last 168 hours. Coagulation Profile: Recent Labs  Lab 01/14/24 2233  INR 1.0   Cardiac Enzymes: No results for input(s): "CKTOTAL", "CKMB", "CKMBINDEX", "TROPONINI" in the last 168 hours. BNP (last 3 results) No results for input(s): "PROBNP" in the last 8760 hours. HbA1C: Recent Labs    01/14/24 2233  HGBA1C 7.1*   CBG: No results for input(s): "GLUCAP" in the last 168 hours. Lipid Profile: No results for input(s): "CHOL", "HDL", "LDLCALC", "TRIG", "CHOLHDL", "LDLDIRECT" in the last 72 hours. Thyroid Function Tests: Recent Labs    01/15/24 0447  TSH 3.144   Anemia Panel: No results for input(s): "VITAMINB12", "FOLATE", "FERRITIN", "TIBC", "IRON", "RETICCTPCT" in the last 72 hours. Sepsis Labs: No results for input(s): "PROCALCITON", "LATICACIDVEN" in the last 168 hours.  Recent Results (from the past 240 hours)  Resp panel by RT-PCR (RSV, Flu A&B, Covid) Anterior Nasal Swab     Status: Abnormal   Collection Time: 01/14/24  6:10 PM   Specimen: Anterior Nasal Swab  Result Value Ref Range Status   SARS Coronavirus 2 by RT PCR NEGATIVE NEGATIVE Final   Influenza A by PCR POSITIVE (A) NEGATIVE Final   Influenza B by PCR NEGATIVE NEGATIVE Final    Comment: (NOTE) The Xpert  Xpress SARS-CoV-2/FLU/RSV plus assay is intended as an aid in the diagnosis of influenza from Nasopharyngeal swab specimens and should not be used as a sole basis for treatment. Nasal washings and aspirates are unacceptable for Xpert Xpress SARS-CoV-2/FLU/RSV testing.  Fact Sheet for Patients: BloggerCourse.com  Fact Sheet for Healthcare Providers: SeriousBroker.it  This test is not yet approved or cleared by the [place] FDA and has been authorized for detection and/or diagnosis of SARS-CoV-2 by FDA under an Emergency Use Authorization (EUA). This EUA will remain in effect (meaning this test can be used) for the duration of the COVID-19 declaration under Section 564(b)(1) of the Act, 21 U.S.C. section 360bbb-3(b)(1), unless the authorization is terminated or revoked.     Resp Syncytial Virus by PCR NEGATIVE NEGATIVE Final    Comment: (NOTE) Fact Sheet for Patients: BloggerCourse.com  Fact Sheet for Healthcare Providers: SeriousBroker.it  This test is not yet approved or cleared by the [place]  FDA and has been authorized for detection and/or diagnosis of SARS-CoV-2 by FDA under an Emergency Use Authorization (EUA). This EUA will remain in effect (meaning this test can be used) for the duration of the COVID-19 declaration under Section 564(b)(1) of the Act, 21 U.S.C. section 360bbb-3(b)(1), unless the authorization is terminated or revoked.  Performed at [facility], [address] [place], [place] [address]          Radiology Studies: DG Chest 2 View Result Date: 01/14/2024 CLINICAL DATA:  Intermittent chest pain and shortness of breath. EXAM: CHEST - 2 VIEW COMPARISON:  None Available. FINDINGS: The heart size and mediastinal contours are within normal limits. Aortic atherosclerosis. No focal consolidation, pleural effusion, or pneumothorax. No acute  osseous abnormality. IMPRESSION: No acute cardiopulmonary findings. Electronically Signed   By: Hart Robinsons M.D.   On: 01/14/2024 19:36        Scheduled Meds:  aspirin EC  81 mg Oral QHS   atorvastatin  10 mg Oral QHS   clopidogrel  75 mg Oral QHS   famotidine  20 mg Oral Daily   furosemide  40 mg Intravenous Once   insulin aspart  0-9 Units Subcutaneous TID WC   levothyroxine  75 mcg Oral Daily   losartan  100 mg Oral Daily   metoprolol succinate  50 mg Oral BID   sodium chloride flush  3 mL Intravenous Q12H   Continuous Infusions:  heparin 1,400 Units/hr (01/14/24 2335)     LOS: 0 days    Time spent: 35 minutes    Terica Yogi A Bryndan Bilyk, MD [facility]   If 7PM-7AM, please contact night-coverage [email or web address]  01/15/2024, 8:26 AM

## 2024-01-15 NOTE — ED Notes (Signed)
 Son would like an update, number is included as an emergency contact

## 2024-01-15 NOTE — Progress Notes (Signed)
 ANTICOAGULATION CONSULT NOTE  Pharmacy Consult for Heparin Indication: chest pain/ACS and atrial fibrillation  No Known Allergies  Patient Measurements: Height: 6' (182.9 cm) Weight: 104.1 kg (229 lb 8 oz) IBW/kg (Calculated) : 77.6 Heparin Dosing Weight: 101 kg  Vital Signs: Temp: 97.6 F (36.4 C) (02/19 1600) Temp Source: Oral (02/19 1600) BP: 122/81 (02/19 1700) Pulse Rate: 89 (02/19 1715)  Labs: Recent Labs    01/14/24 1810 01/14/24 1817 01/14/24 2023 01/14/24 2233 01/15/24 0447  HGB 14.2  --   --   --  13.3  HCT 42.1  --   --   --  40.0  PLT 202  --   --   --  187  APTT  --   --   --  25  --   LABPROT  --   --   --  13.7  --   INR  --   --   --  1.0  --   HEPARINUNFRC  --   --   --   --  0.35  CREATININE  --  1.48*  --   --  1.23  TROPONINIHS 94*  --  94*  --   --     Estimated Creatinine Clearance: 58.8 mL/min (by C-G formula based on SCr of 1.23 mg/dL).   Medical History: 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) 08/26/2017   GERD (gastroesophageal reflux disease)    Heart disease    Hyperglycemia    Hypertension    Mixed hyperlipidemia    Muscle cramp 08/26/2017   Neuropathy    Osteoarthritis    RBBB    RBBB (right bundle branch block)    Type 2 diabetes mellitus (HCC)     Assessment: 57 yom with a history of CAD s/p DES, HTN, HLD, T2DM, hypothyroidism, RBBB. Patient is presenting with chest pain and AF. Heparin per pharmacy consult placed for chest pain/ACS and atrial fibrillation.  S/p cath>>CAD. Consider for CABG. Ok to resume heparin 2 hr post TR band removal (~1830).   Goal of Therapy:  Heparin level 0.3-0.7 units/ml Monitor platelets by anticoagulation protocol: Yes   Plan:  Resume heparin infusion at 1400 units/hr at 2030 Check anti-Xa  level in 8 hours and daily while on heparin Continue to monitor H&H and platelets  Ulyses Southward, PharmD, BCIDP, AAHIVP, CPP Infectious Disease Pharmacist 01/15/2024 6:19 PM

## 2024-01-15 NOTE — H&P (View-Only) (Signed)
 Cardiology Progress Note  Patient ID: Darin Mcclure MRN: [id number] DOB: May 06, 1941 Date of Encounter: 01/15/2024 Primary Cardiologist: Darin Schultz, MD  Subjective   Chief Complaint: SOB  HPI: Reports shortness of breath and chest discomfort.  Troponins are flat.  EKG shows A-fib with right bundle branch block.  BNP elevated.  He does have increased interstitial markings on chest x-ray on my review.  Echo is pending.  ROS:  All other ROS reviewed and negative. Pertinent positives noted in the HPI.     Telemetry  Overnight telemetry shows A-fib heart rate 80 to 90 bpm, which I personally reviewed.     Physical Exam   Vitals:   01/15/24 0130 01/15/24 0411 01/15/24 0630 01/15/24 0713  BP: 115/82 110/81 (!) 143/104   Pulse: 98 89 79   Resp: 12 18 16    Temp:  97.6 F (36.4 C)  98.1 F (36.7 C)  TempSrc:  Oral  Oral  SpO2: 94% 100% 100%   Weight:      Height:       No intake or output data in the 24 hours ending 01/15/24 0828     01/14/2024   10:31 PM 01/08/2024   11:14 AM 11/29/2023    8:05 AM  Last 3 Weights  Weight (lbs) 242 lb 15.2 oz 243 lb 243 lb 6.4 oz  Weight (kg) 110.2 kg 110.224 kg 110.406 kg    Body mass index is 32.95 kg/m.  General: Well nourished, well developed, in no acute distress Head: Atraumatic, normal size  Eyes: PEERLA, EOMI  Neck: Supple, no JVD Endocrine: No thryomegaly Cardiac: Normal S1, S2; irregular rhythm Lungs: Crackles at the lung bases Abd: Soft, nontender, no hepatomegaly  Ext: No edema, pulses 2+ Musculoskeletal: No deformities, BUE and BLE strength normal and equal Skin: Warm and dry, no rashes   Neuro: Alert and oriented to person, place, time, and situation, CNII-XII grossly intact, no focal deficits  Psych: Normal mood and affect   Cardiac Studies  TTE 01/12/2021  1. Left ventricular ejection fraction, by estimation, is 60 to 65%. The  left ventricle has normal function. The left ventricle has no regional  wall motion  abnormalities. The left ventricular internal cavity size was  mildly dilated. Left ventricular  diastolic parameters are consistent with Grade II diastolic dysfunction  (pseudonormalization).   2. Right ventricular systolic function is normal. The right ventricular  size is normal. There is mildly elevated pulmonary artery systolic  pressure.   3. Left atrial size was mildly dilated.   4. The mitral valve is abnormal. Mild mitral valve regurgitation.  Moderate mitral annular calcification.   5. The aortic valve is abnormal. Aortic valve regurgitation is not  visualized. Mild aortic valve sclerosis is present, with no evidence of  aortic valve stenosis.   6. The inferior vena cava is normal in size with greater than 50%  respiratory variability, suggesting right atrial pressure of 3 mmHg.   Patient Profile  Darin Mcclure is a 83 y.o. male with CAD status post PCI to the RCA in 2007, hypertension, hyperlipidemia, diabetes, right bundle branch block who was admitted on 01/14/2024 for new onset A-fib shortness of breath and chest pain.  Found to have influenza and concerns for congestive heart failure.  Assessment & Plan   # Shortness of breath # Influenza A # Concerns for congestive heart failure # Elevated troponin, possible demand # History of CAD -Describes having cough and congestion for 2 weeks.  Was eventually prescribed prednisone by  primary care physician.  Stopped prednisone on Monday and then felt very poor.  Reports fatigue low energy and shortness of breath.  Developed chest pressure last night. -Still short of breath.  X-ray on my review shows interstitial edema.  Elevated BNP 666. -Troponins are elevated but flat. -Suspect this could be a demand process.  We will give him Lasix 40 mg IV as a one-time dose.  We will obtain an echo.  He can eat today.  I have ordered procalcitonin, ESR and CRP.  We will also order procalcitonin. -I think he will need a left heart catheterization  but I would like to pursue further testing.  Discussed this with the son and the patient.  We can tentatively plan for left heart cath tomorrow. -Additionally this could be something we end up managing medically. -Continue aspirin and heparin drip.  I am going to hold Plavix. -Okay to continue metoprolol succinate 50 mg daily. -We will see how he does today. -We could alternatively treat this medically.  I did discuss this with the patient.  Given influenza A positivity we will see how he does today. -Continue high intensity statin.  Repeat lipids.  # Persistent atrial fibrillation -Likely complication of flu.  Would recommend rate control strategy for now.  Echo is pending. -Continue metoprolol succinate 50 mg twice daily. -Continue heparin drip.  Likely will need left heart cath.  Consideration for outpatient cardioversion when she recovers from fluid.  # Hypertension -Continue home losartan 100 mg daily metoprolol succinate 50 mg twice daily.  For questions or updates, please contact [facility] Please consult [email or web address] for contact info under    Signed, Darin Spore T. Flora Lipps, MD, [facility]  [facility]  01/15/2024 8:28 AM   Addendum: Echo shows normal LV function with no regional wall motion abnormalities.  BNP is elevated but given reassuring echo that shows no severe systolic dysfunction or RV failure concerning for possible PE we will proceed with left heart catheterization today.  Risk and benefits explained to the patient.  He is willing to proceed.  Informed Consent   Shared Decision Making/Informed Consent The risks [stroke (1 in 1000), death (1 in 1000), kidney failure [usually temporary] (1 in 500), bleeding (1 in 200), allergic reaction [possibly serious] (1 in 200)], benefits (diagnostic support and management of coronary artery disease) and alternatives of a cardiac catheterization were discussed in detail with Darin Mcclure and he is willing to  proceed.     Darin Spore T. Flora Lipps, MD, [facility]  [facility]  [address], [address] [place], [place] [address] [phone]  11:16 AM

## 2024-01-15 NOTE — Consult Note (Signed)
 [address]       [place] [address]             [phone]        Darin Mcclure [facility] Medical Record #[id number] Date of Birth: 1941/09/14  Referring: No ref. provider found Primary Care: Eartha Inch, MD Primary Cardiologist:Mark Anne Fu, MD  Chief Complaint:    Chief Complaint  Patient presents with   Shortness of Breath    History of Present Illness:    Patient is a 83 year old male we are asked to see in cardiothoracic surgical consultation for consideration of coronary artery surgical revascularization.  He has a known history of CAD status post drug-eluting stent to the RCA in 2007.  Other cardiac risk factors include hypertension, hyperlipidemia and type 2 diabetes.  He also has a known right bundle branch block.  He presented to the emergency room on 218 with atrial fibrillation.  He has recently had symptoms of a URI and did undergo a course of antibiotics as well as steroids which she completed on 01/13/2024.  The day prior to presenting he had an episode of chest pain and shortness of breath while climbing the steps.  The pain was midsternal.  It did not radiate and improved with rest.  On the date of presentation to the ED the symptoms became more persistent and he notes that he was feeling increasingly fatigued.  He was found to be in atrial fibrillation with intermittent episodes of RVR with heart rate up to the 150s.  Cardiology consultation was obtained and he was admitted for further medical stabilization and diagnostic evaluation.  He had 2 sets of troponin I's obtained both values were 94.  BNP was elevated at 666.1.  He was started on IV heparin.  He has been maintained at home on Plavix and aspirin for his previous stent.  He has subsequently undergone cardiac catheterization and echocardiogram and the full results are as described below.  He has severe three-vessel disease with good LV function and good RV function.  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    Current Activity/ Functional Status: Patient was independent with mobility/ambulation, transfers, ADL's, IADL's.   Zubrod Score: At the time of surgery this patient's most appropriate activity status/level should be described as: []     0    Normal activity, no symptoms [x]     1    Restricted in physical strenuous activity but ambulatory, able to do out light work []     2    Ambulatory and capable of self care, unable to do work activities, up and about                 more than 50%  Of the time                            []     3    Only limited self care, in bed greater than 50% of waking hours []     4    Completely disabled, no self care, confined to bed or chair []     5    Moribund  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) 08/26/2017   GERD (gastroesophageal reflux  disease)    Heart disease    Hyperglycemia    Hypertension    Mixed hyperlipidemia    Muscle cramp 08/26/2017   Neuropathy    Osteoarthritis    RBBB    RBBB (right bundle branch block)    Type 2 diabetes mellitus (HCC)     Past Surgical History:  Procedure Laterality Date   ACHILLES TENDON REPAIR Right    BACK SURGERY     CORONARY ANGIOPLASTY     pror stent to mid RCA 10/04/2006   FOOT SURGERY     Cyst removal   INGUINAL HERNIA REPAIR     PERCUTANEOUS CORONARY STENT INTERVENTION (PCI-S)  09/2006   REPLACEMENT TOTAL KNEE Left    TOTAL HIP ARTHROPLASTY Right    TOTAL HIP ARTHROPLASTY Left     Social History   Tobacco Use  Smoking Status Never  Smokeless Tobacco Never    Social History   Substance and Sexual Activity  Alcohol Use Yes   Comment: Rarely     No Known Allergies  Current Facility-Administered Medications  Medication Dose Route Frequency Provider Last  Rate Last Admin   0.9 %  sodium chloride infusion   Intravenous Continuous Verne Carrow D, MD       0.9 %  sodium chloride infusion  250 mL Intravenous PRN Kathleene Hazel, MD       acetaminophen (TYLENOL) tablet 650 mg  650 mg Oral Q6H PRN Charlsie Quest, MD       Or   acetaminophen (TYLENOL) suppository 650 mg  650 mg Rectal Q6H PRN Charlsie Quest, MD       aspirin EC tablet 81 mg  81 mg Oral QHS Charlsie Quest, MD   81 mg at 01/15/24 0126   atorvastatin (LIPITOR) tablet 10 mg  10 mg Oral QHS Arabella Merles, PA-C   10 mg at 01/14/24 2234   famotidine (PEPCID) tablet 20 mg  20 mg Oral Daily PRN Regalado, Belkys A, MD       hydrALAZINE (APRESOLINE) injection 10 mg  10 mg Intravenous Q20 Min PRN Kathleene Hazel, MD       insulin aspart (novoLOG) injection 0-9 Units  0-9 Units Subcutaneous TID WC Charlsie Quest, MD   2 Units at 01/15/24 1237   labetalol (NORMODYNE) injection 10 mg  10 mg Intravenous Q10 min PRN Kathleene Hazel, MD       levothyroxine (SYNTHROID) tablet 75 mcg  75 mcg Oral Daily Charlsie Quest, MD   75 mcg at 01/15/24 0514   losartan (COZAAR) tablet 100 mg  100 mg Oral Daily Arabella Merles, PA-C       metoprolol succinate (TOPROL-XL) 24 hr tablet 50 mg  50 mg Oral BID Arabella Merles, PA-C   50 mg at 01/15/24 1022   metoprolol tartrate (LOPRESSOR) injection 5 mg  5 mg Intravenous Q4H PRN Darreld Mclean R, MD   5 mg at 01/15/24 1318   ondansetron (ZOFRAN) tablet 4 mg  4 mg Oral Q6H PRN Charlsie Quest, MD       Or   ondansetron (ZOFRAN) injection 4 mg  4 mg Intravenous Q6H PRN Charlsie Quest, MD       Oral care mouth rinse  15 mL Mouth Rinse PRN Regalado, Belkys A, MD       senna-docusate (Senokot-S) tablet 1 tablet  1 tablet Oral QHS PRN Charlsie Quest, MD       sodium chloride flush (NS)  0.9 % injection 3 mL  3 mL Intravenous Q12H Darreld Mclean R, MD   3 mL at 01/15/24 1023   sodium chloride flush (NS) 0.9 % injection 3 mL  3 mL  Intravenous Q12H Kathleene Hazel, MD       sodium chloride flush (NS) 0.9 % injection 3 mL  3 mL Intravenous PRN Kathleene Hazel, MD        Medications Prior to Admission  Medication Sig Dispense Refill Last Dose/Taking   aspirin EC 81 MG tablet Take 81 mg by mouth daily.   01/13/2024 Bedtime   atorvastatin (LIPITOR) 10 MG tablet Take 1 tablet (10 mg total) by mouth daily. 90 tablet 3 01/13/2024 Bedtime   carisoprodol (SOMA) 250 MG tablet Take 250 mg by mouth at bedtime as needed (muscle spasms).   Past Week   clopidogrel (PLAVIX) 75 MG tablet Take 1 tablet (75 mg total) by mouth daily. 90 tablet 3 01/13/2024 Bedtime   famotidine (PEPCID) 20 MG tablet Take 1 tablet (20 mg total) by mouth daily. 30 tablet 1 01/13/2024 Bedtime   fluticasone (FLONASE) 50 MCG/ACT nasal spray Place 1 spray into both nostrils daily as needed.   3 Past Month   JANUVIA 100 MG tablet Take 100 mg by mouth daily.   01/14/2024 Morning   levothyroxine (SYNTHROID) 75 MCG tablet Take 75 mcg by mouth daily.   01/14/2024 Morning   losartan (COZAAR) 100 MG tablet Take 1 tablet (100 mg total) by mouth daily. 90 tablet 3 01/14/2024 Morning   Magnesium 500 MG CAPS Take 1,000 mg by mouth daily.   01/14/2024 Morning   metFORMIN (GLUCOPHAGE) 1000 MG tablet Take 1 tablet by mouth 2 (two) times daily.   01/14/2024 Morning   methylcellulose oral powder Take 1 packet by mouth daily.   01/14/2024 Morning   metoprolol succinate (TOPROL-XL) 50 MG 24 hr tablet Take 1 tablet (50 mg total) by mouth 2 (two) times daily. 180 tablet 3 01/14/2024 Morning   Multiple Vitamin (MULTIVITAMIN) tablet Take 1 tablet by mouth daily.   01/14/2024 Morning   NONFORMULARY OR COMPOUNDED ITEM Topical antifungal topical solution: terbinafine 3%, fluconazole 2%, tea tree oil 5%, Urea 10%, ibuprofen2% in DMSO suspension. Sig: Apply to the affected toenail(s) once daily.  Order sent to [facility] [address] [place], [place] [address] 30 each 5  01/14/2024 Morning   Probiotic Product (PROBIOTIC DAILY PO) Take 1 tablet by mouth daily.   01/14/2024 Morning   amoxicillin (AMOXIL) 500 MG capsule As needed for dental procedure (Patient not taking: Reported on 01/14/2024)   Not Taking   meloxicam (MOBIC) 15 MG tablet Take 1 tablet (15 mg total) by mouth daily. (Patient not taking: Reported on 01/14/2024) 60 tablet 1 Not Taking   Omega-3 Fatty Acids (OMEGA-3 FISH OIL PO) Take 4,000 mg by mouth at bedtime.      ONE TOUCH ULTRA TEST test strip USE 1 TO 2 TIMES A DAY  5     Family History  Problem Relation Age of Onset   CAD Father    Lung cancer Father    Colon cancer Neg Hx    Esophageal cancer Neg Hx    Rectal cancer Neg Hx    Stomach cancer Neg Hx      Review of Systems:   Review of Systems  Constitutional:  Positive for malaise/fatigue.  HENT:  Positive for congestion.   Respiratory:  Positive for cough.   Cardiovascular:  Positive for chest pain.  Gastrointestinal:  Positive for heartburn.  Neurological:  Positive for dizziness.           Physical Exam: BP 114/78   Pulse 86   Temp 97.6 F (36.4 C) (Oral)   Resp 20   Ht 6' (1.829 m)   Wt 104.1 kg   SpO2 98%   BMI 31.13 kg/m    General appearance: alert, cooperative, and no distress Head: Normocephalic, without obvious abnormality, atraumatic Neck: no adenopathy, no carotid bruit, no JVD, supple, symmetrical, trachea midline, and thyroid not enlarged, symmetric, no tenderness/mass/nodules Lymph nodes: Cervical, supraclavicular, and axillary nodes normal. Resp: clear to auscultation bilaterally Back: symmetric, no curvature. ROM normal. No CVA tenderness. Cardio: regular rate and rhythm, S1, S2 normal, no murmur, click, rub or gallop GI: soft, non-tender; bowel sounds normal; no masses,  no organomegaly Extremities: extremities normal, atraumatic, no cyanosis or edema and palpable pedal pulses Neurologic: Grossly normal  Diagnostic Studies & Laboratory data:      Recent Radiology Findings:   CARDIAC CATHETERIZATION Result Date: 01/15/2024   Prox RCA-1 lesion is 80% stenosed.   Prox RCA-2 lesion is 30% stenosed.   Mid RCA lesion is 30% stenosed.   Mid Cx lesion is 90% stenosed.   2nd Mrg lesion is 90% stenosed.   Mid LAD lesion is 80% stenosed.   Dist LAD lesion is 70% stenosed.   2nd Diag lesion is 95% stenosed. Severe mid LAD stenosis at the takeoff of a small caliber diagonal branch. The Diagonal branch has a severe stenosis The mid Circumflex has a severe stenosis involving the ostium of the large caliber obtuse marginal branch The large, dominant RCA is heavily calcified throughout the proximal and mid vessel. The proximal vessel has a heavily calcified severe stenosis. The stent in the mid vessel is patent with minimal restenosis. Normal LV filling pressure. Recommendations: Will consult CT surgery for consideration of CABG which I feel is the best revascularization strategy in this very active, diabetic male. Continue ASA and statin. Resume IV heparin 2 hours post radial band removal.   ECHOCARDIOGRAM COMPLETE Result Date: 01/15/2024    ECHOCARDIOGRAM REPORT   Patient Name:   Darin Mcclure Date of Exam: 01/15/2024 Medical Rec #:  [id number]     Height:       72.0 in Accession #:    [id number]    Weight:       242.9 lb Date of Birth:  [birth year]    BSA:          2.314 m Patient Age:    82 years      BP:           146/101 mmHg Patient Gender: M             HR:           98 bpm. Exam Location:  Inpatient Procedure: 2D Echo, Cardiac Doppler and Color Doppler (Both Spectral and Color            Flow Doppler were utilized during procedure). Indications:    Atrial Fibrillation I48.91                 NSTEMI I21.4  History:        Patient has prior history of Echocardiogram examinations, most                 recent 01/12/2021. CAD; Risk Factors:Hypertension and Diabetes.  Sonographer:    Darlys Gales Referring Phys: Donavan Burnet IMPRESSIONS  1. Left  ventricular  ejection fraction, by estimation, is 65 to 70%. The left ventricle has normal function. The left ventricle has no regional wall motion abnormalities. There is moderate left ventricular hypertrophy. Left ventricular diastolic parameters are indeterminate.  2. Right ventricular systolic function is normal. The right ventricular size is normal. There is normal pulmonary artery systolic pressure. The estimated right ventricular systolic pressure is 24.9 mmHg.  3. Left atrial size was moderately dilated.  4. The mitral valve is degenerative. Trivial mitral valve regurgitation. Mild to moderate mitral stenosis. The mean mitral valve gradient is 5.0 mmHg. Severe mitral annular calcification.  5. The aortic valve is normal in structure. Aortic valve regurgitation is not visualized. No aortic stenosis is present.  6. The inferior vena cava is normal in size with greater than 50% respiratory variability, suggesting right atrial pressure of 3 mmHg. Comparison(s): Prior images reviewed side by side. FINDINGS  Left Ventricle: Left ventricular ejection fraction, by estimation, is 65 to 70%. The left ventricle has normal function. The left ventricle has no regional wall motion abnormalities. Strain imaging was not performed. The left ventricular internal cavity  size was normal in size. There is moderate left ventricular hypertrophy. Left ventricular diastolic parameters are indeterminate. Right Ventricle: The right ventricular size is normal. No increase in right ventricular wall thickness. Right ventricular systolic function is normal. There is normal pulmonary artery systolic pressure. The tricuspid regurgitant velocity is 2.34 m/s, and  with an assumed right atrial pressure of 3 mmHg, the estimated right ventricular systolic pressure is 24.9 mmHg. Left Atrium: Left atrial size was moderately dilated. Right Atrium: Right atrial size was normal in size. Pericardium: There is no evidence of pericardial effusion. Mitral Valve: The  mitral valve is degenerative in appearance. There is moderate thickening of the mitral valve leaflet(s). There is moderate calcification of the mitral valve leaflet(s). Severe mitral annular calcification. Trivial mitral valve regurgitation. Mild to moderate mitral valve stenosis. MV peak gradient, 7.5 mmHg. The mean mitral valve gradient is 5.0 mmHg. Tricuspid Valve: The tricuspid valve is normal in structure. Tricuspid valve regurgitation is trivial. No evidence of tricuspid stenosis. Aortic Valve: The aortic valve is normal in structure. Aortic valve regurgitation is not visualized. No aortic stenosis is present. Aortic valve peak gradient measures 4.8 mmHg. Pulmonic Valve: The pulmonic valve was normal in structure. Pulmonic valve regurgitation is not visualized. No evidence of pulmonic stenosis. Aorta: The aortic root is normal in size and structure. Venous: The inferior vena cava is normal in size with greater than 50% respiratory variability, suggesting right atrial pressure of 3 mmHg. IAS/Shunts: No atrial level shunt detected by color flow Doppler. Additional Comments: 3D imaging was not performed.  LEFT VENTRICLE PLAX 2D LVIDd:         4.90 cm   Diastology LVIDs:         3.40 cm   LV e' medial:   8.05 cm/s LV PW:         1.50 cm   LV E/e' medial: 15.4 LV IVS:        1.45 cm LVOT diam:     2.00 cm LV SV:         58 LV SV Index:   25 LVOT Area:     3.14 cm  RIGHT VENTRICLE RV S prime:     13.90 cm/s TAPSE (M-mode): 2.4 cm LEFT ATRIUM             Index        RIGHT  ATRIUM           Index LA Vol (A2C):   99.6 ml 43.04 ml/m  RA Area:     21.30 cm LA Vol (A4C):   51.2 ml 22.12 ml/m  RA Volume:   62.20 ml  26.88 ml/m LA Biplane Vol: 74.4 ml 32.15 ml/m  AORTIC VALVE AV Area (Vmax): 3.14 cm AV Vmax:        109.00 cm/s AV Peak Grad:   4.8 mmHg LVOT Vmax:      109.00 cm/s LVOT Vmean:     68.300 cm/s LVOT VTI:       0.184 m  AORTA Ao Root diam: 3.60 cm MITRAL VALVE                TRICUSPID VALVE MV Area (PHT):  2.56 cm     TR Peak grad:   21.9 mmHg MV Area VTI:   2.45 cm     TR Vmax:        234.00 cm/s MV Peak grad:  7.5 mmHg MV Mean grad:  5.0 mmHg     SHUNTS MV Vmax:       1.37 m/s     Systemic VTI:  0.18 m MV Vmean:      101.0 cm/s   Systemic Diam: 2.00 cm MV Decel Time: 296 msec MV E velocity: 124.00 cm/s MV A velocity: 44.50 cm/s MV E/A ratio:  2.79 Donato Schultz MD Electronically signed by Donato Schultz MD Signature Date/Time: 01/15/2024/1:04:00 PM    Final    DG Chest 2 View Result Date: 01/14/2024 CLINICAL DATA:  Intermittent chest pain and shortness of breath. EXAM: CHEST - 2 VIEW COMPARISON:  None Available. FINDINGS: The heart size and mediastinal contours are within normal limits. Aortic atherosclerosis. No focal consolidation, pleural effusion, or pneumothorax. No acute osseous abnormality. IMPRESSION: No acute cardiopulmonary findings. Electronically Signed   By: Hart Robinsons M.D.   On: 01/14/2024 19:36     I have independently reviewed the above radiologic studies and discussed with the patient   Recent Lab Findings: Lab Results  Component Value Date   WBC 8.4 01/15/2024   HGB 13.3 01/15/2024   HCT 40.0 01/15/2024   PLT 187 01/15/2024   GLUCOSE 154 (H) 01/15/2024   CHOL 100 12/25/2017   TRIG 166 (H) 12/25/2017   HDL 36 (L) 12/25/2017   LDLCALC 31 12/25/2017   ALT 60 (H) 01/15/2024   AST 43 (H) 01/15/2024   NA 135 01/15/2024   K 4.4 01/15/2024   CL 102 01/15/2024   CREATININE 1.23 01/15/2024   BUN 28 (H) 01/15/2024   CO2 21 (L) 01/15/2024   TSH 3.144 01/15/2024   INR 1.0 01/14/2024   HGBA1C 7.1 (H) 01/14/2024      Assessment / Plan: Severe three-vessel coronary artery disease, non-STEMI, history of previous PCI/stent Atrial fibrillation History of right bundle branch block Influenza A GERD Hyperlipidemia DM 2  Patient appears to be a good candidate for CABG.  The surgeon will review his studies and make final decisions in terms of suitability and timing.  It is  likely he could go home prior to surgery for more recovery time from influenza.  If discharged she will likely be on Eliquis for the atrial fibrillation so we will need to be determined when to stop.    I  spent 30 minutes counseling the patient face to face.  Rowe Clack, PA-C  01/15/2024 4:10 PM

## 2024-01-15 NOTE — Hospital Course (Signed)
 Darin Mcclure is a 83 y.o. male with medical history significant for CAD s/p DES to mid RCA 2007, RBBB, T2DM, HTN, CKD stage IIIa, hypothyroidism, HLD who is admitted with new onset A-fib.

## 2024-01-16 ENCOUNTER — Other Ambulatory Visit (HOSPITAL_COMMUNITY): Payer: Self-pay

## 2024-01-16 ENCOUNTER — Telehealth (HOSPITAL_COMMUNITY): Payer: Self-pay | Admitting: Pharmacy Technician

## 2024-01-16 DIAGNOSIS — J101 Influenza due to other identified influenza virus with other respiratory manifestations: Secondary | ICD-10-CM | POA: Diagnosis not present

## 2024-01-16 DIAGNOSIS — E1159 Type 2 diabetes mellitus with other circulatory complications: Secondary | ICD-10-CM | POA: Diagnosis not present

## 2024-01-16 DIAGNOSIS — I152 Hypertension secondary to endocrine disorders: Secondary | ICD-10-CM

## 2024-01-16 DIAGNOSIS — I2489 Other forms of acute ischemic heart disease: Secondary | ICD-10-CM

## 2024-01-16 DIAGNOSIS — I4891 Unspecified atrial fibrillation: Secondary | ICD-10-CM | POA: Diagnosis not present

## 2024-01-16 LAB — CBC
HCT: 46.7 % (ref 39.0–52.0)
Hemoglobin: 16.2 g/dL (ref 13.0–17.0)
MCH: 30.9 pg (ref 26.0–34.0)
MCHC: 34.7 g/dL (ref 30.0–36.0)
MCV: 89 fL (ref 80.0–100.0)
Platelets: 224 10*3/uL (ref 150–400)
RBC: 5.25 MIL/uL (ref 4.22–5.81)
RDW: 13 % (ref 11.5–15.5)
WBC: 10 10*3/uL (ref 4.0–10.5)
nRBC: 0 % (ref 0.0–0.2)

## 2024-01-16 LAB — MAGNESIUM: Magnesium: 1.8 mg/dL (ref 1.7–2.4)

## 2024-01-16 LAB — BASIC METABOLIC PANEL
Anion gap: 14 (ref 5–15)
BUN: 30 mg/dL — ABNORMAL HIGH (ref 8–23)
CO2: 23 mmol/L (ref 22–32)
Calcium: 9.5 mg/dL (ref 8.9–10.3)
Chloride: 102 mmol/L (ref 98–111)
Creatinine, Ser: 1.55 mg/dL — ABNORMAL HIGH (ref 0.61–1.24)
GFR, Estimated: 45 mL/min — ABNORMAL LOW (ref >60)
Glucose, Bld: 181 mg/dL — ABNORMAL HIGH (ref 70–99)
Potassium: 4.2 mmol/L (ref 3.5–5.1)
Sodium: 139 mmol/L (ref 135–145)

## 2024-01-16 LAB — HEPATIC FUNCTION PANEL
ALT: 49 U/L — ABNORMAL HIGH (ref 0–44)
AST: 25 U/L (ref 15–41)
Albumin: 3.7 g/dL (ref 3.5–5.0)
Alkaline Phosphatase: 44 U/L (ref 38–126)
Bilirubin, Direct: 0.1 mg/dL (ref 0.0–0.2)
Indirect Bilirubin: 1 mg/dL — ABNORMAL HIGH (ref 0.3–0.9)
Total Bilirubin: 1.1 mg/dL (ref 0.0–1.2)
Total Protein: 6.9 g/dL (ref 6.5–8.1)

## 2024-01-16 LAB — GLUCOSE, CAPILLARY
Glucose-Capillary: 165 mg/dL — ABNORMAL HIGH (ref 70–99)
Glucose-Capillary: 177 mg/dL — ABNORMAL HIGH (ref 70–99)

## 2024-01-16 LAB — RESP PANEL BY RT-PCR (RSV, FLU A&B, COVID)  RVPGX2
Influenza A by PCR: NEGATIVE
Influenza B by PCR: NEGATIVE
Resp Syncytial Virus by PCR: NEGATIVE
SARS Coronavirus 2 by RT PCR: NEGATIVE

## 2024-01-16 LAB — LIPID PANEL
Cholesterol: 117 mg/dL (ref 0–200)
HDL: 35 mg/dL — ABNORMAL LOW (ref >40)
LDL Cholesterol: 16 mg/dL (ref 0–99)
Total CHOL/HDL Ratio: 3.3 {ratio}
Triglycerides: 332 mg/dL — ABNORMAL HIGH (ref <150)
VLDL: 66 mg/dL — ABNORMAL HIGH (ref 0–40)

## 2024-01-16 MED ORDER — DILTIAZEM HCL 60 MG PO TABS
60.0000 mg | ORAL_TABLET | Freq: Three times a day (TID) | ORAL | Status: DC
  Administered 2024-01-16 – 2024-01-17 (×3): 60 mg via ORAL
  Filled 2024-01-16 (×3): qty 1

## 2024-01-16 MED ORDER — METOPROLOL SUCCINATE ER 100 MG PO TB24
100.0000 mg | ORAL_TABLET | Freq: Two times a day (BID) | ORAL | Status: DC
  Administered 2024-01-16 – 2024-01-17 (×3): 100 mg via ORAL
  Filled 2024-01-16 (×3): qty 1

## 2024-01-16 MED ORDER — MAGNESIUM SULFATE 2 GM/50ML IV SOLN
2.0000 g | Freq: Once | INTRAVENOUS | Status: AC
  Administered 2024-01-16: 2 g via INTRAVENOUS
  Filled 2024-01-16: qty 50

## 2024-01-16 MED ORDER — SODIUM CHLORIDE 0.9 % IV SOLN: INTRAVENOUS | Status: DC

## 2024-01-16 MED ORDER — LACTATED RINGERS IV SOLN: INTRAVENOUS | Status: DC

## 2024-01-16 MED ORDER — NONFORMULARY OR COMPOUNDED ITEM: 5 refills | Status: AC

## 2024-01-16 MED ORDER — ATORVASTATIN CALCIUM 40 MG PO TABS
40.0000 mg | ORAL_TABLET | Freq: Every day | ORAL | Status: DC
  Administered 2024-01-16 – 2024-01-25 (×10): 40 mg via ORAL
  Filled 2024-01-16 (×10): qty 1

## 2024-01-16 MED ORDER — LACTATED RINGERS IV SOLN: INTRAVENOUS | Status: AC

## 2024-01-16 MED ORDER — LOSARTAN POTASSIUM 50 MG PO TABS
50.0000 mg | ORAL_TABLET | Freq: Every day | ORAL | Status: DC
  Administered 2024-01-16: 50 mg via ORAL
  Filled 2024-01-16: qty 1

## 2024-01-16 MED ORDER — APIXABAN 5 MG PO TABS
5.0000 mg | ORAL_TABLET | Freq: Two times a day (BID) | ORAL | Status: DC
  Administered 2024-01-16 – 2024-01-17 (×3): 5 mg via ORAL
  Filled 2024-01-16 (×3): qty 1

## 2024-01-16 MED ORDER — LACTATED RINGERS IV BOLUS
500.0000 mL | Freq: Once | INTRAVENOUS | Status: AC
  Administered 2024-01-16: 500 mL via INTRAVENOUS

## 2024-01-16 MED ORDER — ISOSORBIDE MONONITRATE ER 30 MG PO TB24
30.0000 mg | ORAL_TABLET | Freq: Every day | ORAL | Status: DC
Start: 2024-01-16 — End: 2024-01-17
  Administered 2024-01-16 – 2024-01-17 (×2): 30 mg via ORAL
  Filled 2024-01-16 (×2): qty 1

## 2024-01-16 NOTE — Progress Notes (Signed)
 PROGRESS NOTE    Vernice Bowker  AVW:[id number] DOB: [birth year] DOA: 01/14/2024 PCP: Eartha Inch, MD   Brief Narrative: 83 year old with past medical history significant for CAD status post DES to mid RCA 2007, RBBB, diabetes type 2, hypertension, CKD stage IIIa, hypothyroidism, hyperlipidemia presented for evaluation of chest discomfort and shortness of breath.  2 weeks ago he was treated for upper respiratory infection sinusitis for cough.  He completed course of antibiotics and a course of prednisone.  The day prior to admission he developed chest pain on exertion.  Presented for further evaluation  BNP elevated on admission at 666, troponin at 94 x 2.  Influenza A positive.  Chest x-ray negative for consolidation edema or effusion.  Cardiology was consulted for A-fib and RVR and for their evaluation of chest pain.  Underwent Catheterization 2/19,showed three vessels CAD. CVTS have been consulted.   Assessment & Plan:   Principal Problem:   New onset atrial fibrillation (HCC) Active Problems:   Coronary artery disease involving native coronary artery of native heart without angina pectoris   Influenza A   Type 2 diabetes mellitus (HCC)   Hypertension associated with diabetes (HCC)   Chronic kidney disease, stage 3a (HCC)   Hypothyroidism   Hyperlipidemia associated with type 2 diabetes mellitus (HCC)   1-new onset of A-fib with RVR -Patient with heart rate in the 150 irregular. -Was a started on heparin drip---Transition to  Eliquis today. -Metoprolol increase to  100 mg twice daily -IV as needed metoprolol -Cardiology Following  -ECHO: Action fraction 65 to 70%, no wall motion abnormality.  Right ventricular systolic function is normal.  Left ventricular diastolic parameters are indeterminate. HR remain elevated, cardiology have added Cardizem   2-Chest pain, STEMI History of CAD status post DES to RCA 2007 Troponin mildly elevated at 90 Continue with a statin, monitor  liver function test Underwent Cath 2/19: showed three vessel CAD> He will Need CABG. Per cardiology, patient does not need to stay in the hospital awaiting for surgery. Lipitor increased to 40 mg.  Started on Imdur  Influenza A Denies worsening cough, chest x-ray negative for pneumonia. He reports influenza-like symptoms 2 weeks ago, of note 2 weeks ago he was tested negative. Pro-calcitonin  less than 10, will hold on starting antibiotics at this time He asked to have test repeated today.  AKI; Mildly increase cr to1.5 Hold Cozaar.  Will discussed with cardiology in regards IV fluids Post cath.  Hypertension: Continue  Toprol.Hold losartan due to AKI   Diabetes type 2: Continue to hold metformin and Januvia.  Sliding scale insulin of note he did received dose of metformin last night in the  ED. Will need to hold Metformin for 48 hours after cath.   Hypothyroidism: Continue with Synthroid  HLD: Continue with atorvastatin   Estimated body mass index is 30.5 kg/m as calculated from the following:   Height as of this encounter: 6' (1.829 m).   Weight as of this encounter: 102 kg.   DVT prophylaxis: Heparin gtt Code Status: full code Family Communication: Son who was at bedside.  Disposition Plan:  Status is: Observation The patient remains OBS appropriate and will d/c before 2 midnights.    Consultants:  Cardiology   Procedures:  ECHO  Antimicrobials:    Subjective: Patient report chest pressure on and off. He is awaiting to see CVTS Physician.   Objective: Vitals:   01/15/24 2327 01/16/24 0334 01/16/24 0620 01/16/24 0728  BP: 123/73 113/74  121/64  Pulse: 79 78  (!) 116  Resp: 17 20  16   Temp: 98.8 F (37.1 C) 98.2 F (36.8 C)  98.3 F (36.8 C)  TempSrc: Oral Oral  Oral  SpO2: 96% 94%  94%  Weight:   102 kg   Height:        Intake/Output Summary (Last 24 hours) at 01/16/2024 8657 Last data filed at 01/16/2024 0100 Gross per 24 hour  Intake  1165.79 ml  Output 1375 ml  Net -209.21 ml   Filed Weights   01/14/24 2231 01/15/24 1210 01/16/24 0620  Weight: 110.2 kg 104.1 kg 102 kg    Examination:  General exam: NAD Respiratory system: Normal resp effort Cardiovascular system: S 1, S 2 RRR Gastrointestinal system: BS present,soft,nt Central nervous system: Alert.  Extremities: no edema  Data Reviewed: I have personally reviewed following labs and imaging studies  CBC: Recent Labs  Lab 01/14/24 1810 01/15/24 0447  WBC 9.9 8.4  HGB 14.2 13.3  HCT 42.1 40.0  MCV 91.1 92.0  PLT 202 187   Basic Metabolic Panel: Recent Labs  Lab 01/14/24 1817 01/15/24 0447  NA 138 135  K 4.4 4.4  CL 103 102  CO2 24 21*  GLUCOSE 184* 154*  BUN 30* 28*  CREATININE 1.48* 1.23  CALCIUM 9.8 9.2  MG  --  1.8   GFR: Estimated Creatinine Clearance: 58.2 mL/min (by C-G formula based on SCr of 1.23 mg/dL). Liver Function Tests: Recent Labs  Lab 01/14/24 1817 01/15/24 0447  AST 84* 43*  ALT 78* 60*  ALKPHOS 49 39  BILITOT 0.8 0.9  PROT 7.0 6.2*  ALBUMIN 3.7 3.3*   No results for input(s): "LIPASE", "AMYLASE" in the last 168 hours. No results for input(s): "AMMONIA" in the last 168 hours. Coagulation Profile: Recent Labs  Lab 01/14/24 2233  INR 1.0   Cardiac Enzymes: No results for input(s): "CKTOTAL", "CKMB", "CKMBINDEX", "TROPONINI" in the last 168 hours. BNP (last 3 results) No results for input(s): "PROBNP" in the last 8760 hours. HbA1C: Recent Labs    01/14/24 2233  HGBA1C 7.1*   CBG: Recent Labs  Lab 01/15/24 1219 01/15/24 1602 01/15/24 2132 01/16/24 0610  GLUCAP 164* 161* 141* 165*   Lipid Profile: No results for input(s): "CHOL", "HDL", "LDLCALC", "TRIG", "CHOLHDL", "LDLDIRECT" in the last 72 hours. Thyroid Function Tests: Recent Labs    01/15/24 0447  TSH 3.144   Anemia Panel: No results for input(s): "VITAMINB12", "FOLATE", "FERRITIN", "TIBC", "IRON", "RETICCTPCT" in the last 72  hours. Sepsis Labs: Recent Labs  Lab 01/15/24 1012  PROCALCITON <0.10    Recent Results (from the past 240 hours)  Resp panel by RT-PCR (RSV, Flu A&B, Covid) Anterior Nasal Swab     Status: Abnormal   Collection Time: 01/14/24  6:10 PM   Specimen: Anterior Nasal Swab  Result Value Ref Range Status   SARS Coronavirus 2 by RT PCR NEGATIVE NEGATIVE Final   Influenza A by PCR POSITIVE (A) NEGATIVE Final   Influenza B by PCR NEGATIVE NEGATIVE Final    Comment: (NOTE) The Xpert Xpress SARS-CoV-2/FLU/RSV plus assay is intended as an aid in the diagnosis of influenza from Nasopharyngeal swab specimens and should not be used as a sole basis for treatment. Nasal washings and aspirates are unacceptable for Xpert Xpress SARS-CoV-2/FLU/RSV testing.  Fact Sheet for Patients: BloggerCourse.com  Fact Sheet for Healthcare Providers: SeriousBroker.it  This test is not yet approved or cleared by the [place] and has been authorized for  detection and/or diagnosis of SARS-CoV-2 by FDA under an Emergency Use Authorization (EUA). This EUA will remain in effect (meaning this test can be used) for the duration of the COVID-19 declaration under Section 564(b)(1) of the Act, 21 U.S.C. section 360bbb-3(b)(1), unless the authorization is terminated or revoked.     Resp Syncytial Virus by PCR NEGATIVE NEGATIVE Final    Comment: (NOTE) Fact Sheet for Patients: BloggerCourse.com  Fact Sheet for Healthcare Providers: SeriousBroker.it  This test is not yet approved or cleared by the [place] FDA and has been authorized for detection and/or diagnosis of SARS-CoV-2 by FDA under an Emergency Use Authorization (EUA). This EUA will remain in effect (meaning this test can be used) for the duration of the COVID-19 declaration under Section 564(b)(1) of the Act, 21 U.S.C. section 360bbb-3(b)(1),  unless the authorization is terminated or revoked.  Performed at [facility], [address] [place], [place] [address]   MRSA Next Gen by PCR, Nasal     Status: None   Collection Time: 01/15/24 12:32 PM   Specimen: Nasal Mucosa; Nasal Swab  Result Value Ref Range Status   MRSA by PCR Next Gen NOT DETECTED NOT DETECTED Final    Comment: (NOTE) The GeneXpert MRSA Assay (FDA approved for NASAL specimens only), is one component of a comprehensive MRSA colonization surveillance program. It is not intended to diagnose MRSA infection nor to guide or monitor treatment for MRSA infections. Test performance is not FDA approved in patients less than 14 years old. Performed at [facility], [address] [place], [place] [address]          Radiology Studies: CARDIAC CATHETERIZATION Result Date: 01/15/2024   Prox RCA-1 lesion is 80% stenosed.   Prox RCA-2 lesion is 30% stenosed.   Mid RCA lesion is 30% stenosed.   Mid Cx lesion is 90% stenosed.   2nd Mrg lesion is 90% stenosed.   Mid LAD lesion is 80% stenosed.   Dist LAD lesion is 70% stenosed.   2nd Diag lesion is 95% stenosed. Severe mid LAD stenosis at the takeoff of a small caliber diagonal branch. The Diagonal branch has a severe stenosis The mid Circumflex has a severe stenosis involving the ostium of the large caliber obtuse marginal branch The large, dominant RCA is heavily calcified throughout the proximal and mid vessel. The proximal vessel has a heavily calcified severe stenosis. The stent in the mid vessel is patent with minimal restenosis. Normal LV filling pressure. Recommendations: Will consult CT surgery for consideration of CABG which I feel is the best revascularization strategy in this very active, diabetic male. Continue ASA and statin. Resume IV heparin 2 hours post radial band removal.   ECHOCARDIOGRAM COMPLETE Result Date: 01/15/2024    ECHOCARDIOGRAM REPORT   Patient Name:   Fleetwood Pierron Date of Exam:  01/15/2024 Medical Rec #:  [id number]     Height:       72.0 in Accession #:    [id number]    Weight:       242.9 lb Date of Birth:  1941/09/16    BSA:          2.314 m Patient Age:    82 years      BP:           146/101 mmHg Patient Gender: M             HR:           98 bpm. Exam  Location:  Inpatient Procedure: 2D Echo, Cardiac Doppler and Color Doppler (Both Spectral and Color            Flow Doppler were utilized during procedure). Indications:    Atrial Fibrillation I48.91                 NSTEMI I21.4  History:        Patient has prior history of Echocardiogram examinations, most                 recent 01/12/2021. CAD; Risk Factors:Hypertension and Diabetes.  Sonographer:    Darlys Gales Referring Phys: Donavan Burnet IMPRESSIONS  1. Left ventricular ejection fraction, by estimation, is 65 to 70%. The left ventricle has normal function. The left ventricle has no regional wall motion abnormalities. There is moderate left ventricular hypertrophy. Left ventricular diastolic parameters are indeterminate.  2. Right ventricular systolic function is normal. The right ventricular size is normal. There is normal pulmonary artery systolic pressure. The estimated right ventricular systolic pressure is 24.9 mmHg.  3. Left atrial size was moderately dilated.  4. The mitral valve is degenerative. Trivial mitral valve regurgitation. Mild to moderate mitral stenosis. The mean mitral valve gradient is 5.0 mmHg. Severe mitral annular calcification.  5. The aortic valve is normal in structure. Aortic valve regurgitation is not visualized. No aortic stenosis is present.  6. The inferior vena cava is normal in size with greater than 50% respiratory variability, suggesting right atrial pressure of 3 mmHg. Comparison(s): Prior images reviewed side by side. FINDINGS  Left Ventricle: Left ventricular ejection fraction, by estimation, is 65 to 70%. The left ventricle has normal function. The left ventricle has no regional wall motion  abnormalities. Strain imaging was not performed. The left ventricular internal cavity  size was normal in size. There is moderate left ventricular hypertrophy. Left ventricular diastolic parameters are indeterminate. Right Ventricle: The right ventricular size is normal. No increase in right ventricular wall thickness. Right ventricular systolic function is normal. There is normal pulmonary artery systolic pressure. The tricuspid regurgitant velocity is 2.34 m/s, and  with an assumed right atrial pressure of 3 mmHg, the estimated right ventricular systolic pressure is 24.9 mmHg. Left Atrium: Left atrial size was moderately dilated. Right Atrium: Right atrial size was normal in size. Pericardium: There is no evidence of pericardial effusion. Mitral Valve: The mitral valve is degenerative in appearance. There is moderate thickening of the mitral valve leaflet(s). There is moderate calcification of the mitral valve leaflet(s). Severe mitral annular calcification. Trivial mitral valve regurgitation. Mild to moderate mitral valve stenosis. MV peak gradient, 7.5 mmHg. The mean mitral valve gradient is 5.0 mmHg. Tricuspid Valve: The tricuspid valve is normal in structure. Tricuspid valve regurgitation is trivial. No evidence of tricuspid stenosis. Aortic Valve: The aortic valve is normal in structure. Aortic valve regurgitation is not visualized. No aortic stenosis is present. Aortic valve peak gradient measures 4.8 mmHg. Pulmonic Valve: The pulmonic valve was normal in structure. Pulmonic valve regurgitation is not visualized. No evidence of pulmonic stenosis. Aorta: The aortic root is normal in size and structure. Venous: The inferior vena cava is normal in size with greater than 50% respiratory variability, suggesting right atrial pressure of 3 mmHg. IAS/Shunts: No atrial level shunt detected by color flow Doppler. Additional Comments: 3D imaging was not performed.  LEFT VENTRICLE PLAX 2D LVIDd:         4.90 cm    Diastology LVIDs:  3.40 cm   LV e' medial:   8.05 cm/s LV PW:         1.50 cm   LV E/e' medial: 15.4 LV IVS:        1.45 cm LVOT diam:     2.00 cm LV SV:         58 LV SV Index:   25 LVOT Area:     3.14 cm  RIGHT VENTRICLE RV S prime:     13.90 cm/s TAPSE (M-mode): 2.4 cm LEFT ATRIUM             Index        RIGHT ATRIUM           Index LA Vol (A2C):   99.6 ml 43.04 ml/m  RA Area:     21.30 cm LA Vol (A4C):   51.2 ml 22.12 ml/m  RA Volume:   62.20 ml  26.88 ml/m LA Biplane Vol: 74.4 ml 32.15 ml/m  AORTIC VALVE AV Area (Vmax): 3.14 cm AV Vmax:        109.00 cm/s AV Peak Grad:   4.8 mmHg LVOT Vmax:      109.00 cm/s LVOT Vmean:     68.300 cm/s LVOT VTI:       0.184 m  AORTA Ao Root diam: 3.60 cm MITRAL VALVE                TRICUSPID VALVE MV Area (PHT): 2.56 cm     TR Peak grad:   21.9 mmHg MV Area VTI:   2.45 cm     TR Vmax:        234.00 cm/s MV Peak grad:  7.5 mmHg MV Mean grad:  5.0 mmHg     SHUNTS MV Vmax:       1.37 m/s     Systemic VTI:  0.18 m MV Vmean:      101.0 cm/s   Systemic Diam: 2.00 cm MV Decel Time: 296 msec MV E velocity: 124.00 cm/s MV A velocity: 44.50 cm/s MV E/A ratio:  2.79 Donato Schultz MD Electronically signed by Donato Schultz MD Signature Date/Time: 01/15/2024/1:04:00 PM    Final    DG Chest 2 View Result Date: 01/14/2024 CLINICAL DATA:  Intermittent chest pain and shortness of breath. EXAM: CHEST - 2 VIEW COMPARISON:  None Available. FINDINGS: The heart size and mediastinal contours are within normal limits. Aortic atherosclerosis. No focal consolidation, pleural effusion, or pneumothorax. No acute osseous abnormality. IMPRESSION: No acute cardiopulmonary findings. Electronically Signed   By: Hart Robinsons M.D.   On: 01/14/2024 19:36        Scheduled Meds:  apixaban  5 mg Oral BID   aspirin EC  81 mg Oral QHS   atorvastatin  40 mg Oral QHS   insulin aspart  0-9 Units Subcutaneous TID WC   isosorbide mononitrate  30 mg Oral Daily   levothyroxine  75 mcg Oral Daily    losartan  50 mg Oral Daily   metoprolol succinate  100 mg Oral BID   sodium chloride flush  3 mL Intravenous Q12H   sodium chloride flush  3 mL Intravenous Q12H   Continuous Infusions:  sodium chloride       LOS: 1 day    Time spent: 35 minutes    Betsi Crespi A Reem Fleury, MD [facility]   If 7PM-7AM, please contact night-coverage [email or web address]  01/16/2024, 9:28 AM

## 2024-01-16 NOTE — Progress Notes (Signed)
 ANNUAL DIABETIC FOOT EXAM  Subjective: Darin Mcclure presents today for annual diabetic foot exam.  Chief Complaint  Patient presents with   RFC    He is here for    Patient confirms h/o diabetes.  Patient denies any h/o foot wounds.  Patient has been diagnosed with neuropathy.  Darin Inch, MD is patient's PCP.  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) 08/26/2017   GERD (gastroesophageal reflux disease)    Heart disease    Hyperglycemia    Hypertension    Mixed hyperlipidemia    Muscle cramp 08/26/2017   Neuropathy    Osteoarthritis    RBBB    RBBB (right bundle branch block)    Type 2 diabetes mellitus (HCC)    Patient Active Problem List   Diagnosis Date Noted   New onset atrial fibrillation (HCC) 01/14/2024   Influenza A 01/14/2024   Type 2 diabetes mellitus (HCC) 01/14/2024   Hypertension associated with diabetes (HCC) 01/14/2024   Chronic kidney disease, stage 3a (HCC) 01/14/2024   Hypothyroidism 01/14/2024   Hyperlipidemia associated with type 2 diabetes mellitus (HCC) 01/14/2024   Coronary artery disease involving native coronary artery of native heart without angina pectoris 12/29/2021   Pure hypercholesterolemia 12/29/2021   Right bundle branch block 12/29/2021   Carotid artery disease (HCC) 12/29/2021   Gastroesophageal reflux disease without esophagitis 04/19/2021   Impingement syndrome of left shoulder region 05/17/2020   History of prosthetic unicompartmental arthroplasty of left knee 11/18/2019   Pain in left knee 09/24/2018   Pain of left hip joint 09/24/2018   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) 08/26/2017   Muscle cramp 08/26/2017   Past Surgical History:  Procedure Laterality Date    ACHILLES TENDON REPAIR Right    BACK SURGERY     CORONARY ANGIOPLASTY     pror stent to mid RCA 10/04/2006   FOOT SURGERY     Cyst removal   INGUINAL HERNIA REPAIR     LEFT HEART CATH AND CORONARY ANGIOGRAPHY N/A 01/15/2024   Procedure: LEFT HEART CATH AND CORONARY ANGIOGRAPHY;  Surgeon: Kathleene Hazel, MD;  Location: [facility];  Service: Cardiovascular;  Laterality: N/A;   PERCUTANEOUS CORONARY STENT INTERVENTION (PCI-S)  09/2006   REPLACEMENT TOTAL KNEE Left    TOTAL HIP ARTHROPLASTY Right    TOTAL HIP ARTHROPLASTY Left    No current facility-administered medications on file prior to visit.   Current Outpatient Medications on File Prior to Visit  Medication Sig Dispense Refill   amoxicillin (AMOXIL) 500 MG capsule As needed for dental procedure (Patient not taking: Reported on 01/14/2024)     aspirin EC 81 MG tablet Take 81 mg by mouth daily.     atorvastatin (LIPITOR) 10 MG tablet Take 1 tablet (10 mg total) by mouth daily. 90 tablet 3   carisoprodol (SOMA) 250 MG tablet Take 250 mg by mouth at bedtime as needed (muscle spasms).     clopidogrel (PLAVIX) 75 MG tablet Take 1 tablet (75 mg total) by mouth daily. 90 tablet 3   famotidine (PEPCID) 20 MG tablet Take 1 tablet (20 mg total) by mouth daily. 30 tablet 1   fluticasone (FLONASE) 50 MCG/ACT nasal spray Place 1 spray into both nostrils daily as needed.  3   JANUVIA 100 MG tablet Take 100 mg by mouth daily.     levothyroxine (SYNTHROID) 75 MCG tablet Take 75 mcg by mouth daily.     losartan (COZAAR) 100 MG tablet Take 1 tablet (100 mg total) by mouth daily. 90 tablet 3   Magnesium 500 MG CAPS Take 1,000 mg by mouth daily.     meloxicam (MOBIC) 15 MG tablet Take 1 tablet (15 mg total) by mouth daily. (Patient not taking: Reported on 01/14/2024) 60 tablet 1   metFORMIN (GLUCOPHAGE) 1000 MG tablet Take 1 tablet by mouth 2 (two) times daily.     methylcellulose oral powder Take 1 packet by mouth daily.     metoprolol  succinate (TOPROL-XL) 50 MG 24 hr tablet Take 1 tablet (50 mg total) by mouth 2 (two) times daily. 180 tablet 3   Multiple Vitamin (MULTIVITAMIN) tablet Take 1 tablet by mouth daily.     Omega-3 Fatty Acids (OMEGA-3 FISH OIL PO) Take 4,000 mg by mouth at bedtime.     ONE TOUCH ULTRA TEST test strip USE 1 TO 2 TIMES A DAY  5   Probiotic Product (PROBIOTIC DAILY PO) Take 1 tablet by mouth daily.      No Known Allergies Social History   Occupational History   Occupation: retired  Tobacco Use   Smoking status: Never   Smokeless tobacco: Never  Vaping Use   Vaping status: Never Used  Substance and Sexual Activity   Alcohol use: Yes    Comment: Rarely   Drug use: No   Sexual activity: Not on file   Family History  Problem Relation Age of Onset   CAD Father    Lung cancer Father    Colon cancer Neg Hx    Esophageal cancer Neg Hx    Rectal cancer Neg Hx    Stomach cancer Neg Hx    Immunization History  Administered Date(s) Administered   Fluad Quad(high Dose 65+) 08/31/2019   Hep A, Unspecified 11/08/2017, 05/08/2018   Hepatitis A, Ped/Adol-2 Dose 11/08/2017, 05/08/2018   Hepatitis B, ADULT 11/08/2017, 01/03/2018, 05/08/2018   Hepatitis B, PED/ADOLESCENT 11/08/2017, 01/03/2018, 05/08/2018   Influenza, High Dose Seasonal PF 09/01/2021   PFIZER(Purple Top)SARS-COV-2 Vaccination 01/17/2019, 02/08/2019, 01/18/2020, 02/08/2020, 09/06/2020, 03/22/2021, 09/19/2021   Pneumococcal Conjugate-13 12/13/2014   Pneumococcal Polysaccharide-23 11/08/2017   Tdap 12/13/2014   Zoster Recombinant(Shingrix) 08/08/2017, 03/10/2018   Zoster, Unspecified 08/08/2017, 03/10/2018     Review of Systems: Negative except as noted in the HPI.   Objective: There were no vitals filed for this visit.  Darin Mcclure is a pleasant 83 y.o. male in NAD. AAO X 3.  Diabetic foot exam was performed with the following findings:   Vascular Examination: Capillary refill time immediate b/l. Vascular status intact  b/l with palpable pedal pulses. Pedal hair present b/l. No pain with calf compression b/l. Skin temperature gradient WNL b/l. No cyanosis or clubbing b/l. No ischemia or gangrene noted b/l.   Neurological Examination: Sensation grossly intact b/l with 10 gram monofilament. Vibratory sensation intact b/l. Pt has subjective symptoms of neuropathy.  Dermatological Examination: Pedal skin with normal turgor, texture and tone b/l.  No open wounds. No interdigital macerations.   Toenails 1-5 b/l thick, discolored, elongated with subungual debris and pain on dorsal palpation.   No hyperkeratotic nor porokeratotic lesions present on today's visit.  Musculoskeletal Examination: Muscle strength 5/5 to all lower extremity muscle groups bilaterally. Muscle strength 2/5 to all lower extremity muscle groups bilaterally. Hammertoes  b/l LE.  Radiographs: None     Lab Results  Component Value Date   HGBA1C 7.1 (H) 01/14/2024    ADA Risk Categorization: Low Risk :  Patient has all of the following: Intact protective sensation No prior foot ulcer  No severe deformity Pedal pulses present  Assessment: 1. Pain due to onychomycosis of toenails of both feet   2. Hammertoe, bilateral   3. Diabetic peripheral neuropathy associated with type 2 diabetes mellitus (HCC)   4. Encounter for diabetic foot exam (HCC)      Plan: No orders of the defined types were placed in this encounter.   Meds ordered this encounter  Medications   DISCONTD: NONFORMULARY OR COMPOUNDED ITEM    Sig: Topical antifungal topical solution: terbinafine 3%, fluconazole 2%, tea tree oil 5%, Urea 10%, ibuprofen2% in DMSO suspension. Sig: Apply to the affected toenail(s) once daily.  Order sent to [facility] [address] [place], [place] [address]    Dispense:  30 each    Refill:  5   NONFORMULARY OR COMPOUNDED ITEM    Sig: Topical antifungal topical solution: terbinafine 3%, fluconazole 2%, tea tree oil 5%,  Urea 10%, ibuprofen2% in DMSO suspension. Sig: Apply to the affected toenail(s) once daily.  Order sent to [facility] [address] [place], [place] [address]    Dispense:  30 each    Refill:  5   Patient was evaluated and treated. All patient's and/or POA's questions/concerns addressed on today's visit. Toenails 1-5 debrided in length and girth without incident. Continue soft, supportive shoe gear daily. Report any pedal injuries to medical professional. Call office if there are any questions/concerns. -Diabetic foot examination performed today. -Continue diabetic foot care principles: inspect feet daily, monitor glucose as recommended by PCP and/or Endocrinologist, and follow prescribed diet per PCP, Endocrinologist and/or dietician. -Discussed treatment options for onychomycosis. Patient opted for topical treatment with compounded medication. Rx written for nonformulary compounding topical antifungal: [facility]: Antifungal cream - Terbinafine 3%, Fluconazole 2%, Tea Tree Oil 5%, Urea 10%, Ibuprofen 2% in DMSO Suspension #4ml. Apply to the affected nail(s) once daily. -Patient/POA to call should there be question/concern in the interim. Return in about 4 months (around 05/07/2024).  [name], DPM      [place] LOCATION: [address], [place] [address]                   Office [phone]   [facility] LOCATION: [address] [place], [place] [address] Office [phone]

## 2024-01-16 NOTE — Discharge Instructions (Addendum)
Discharge Instructions:  1. You may shower, please wash incisions daily with soap and water and keep dry.  If you wish to cover wounds with dressing you may do so but please keep clean and change daily.  No tub baths or swimming until incisions have completely healed.  If your incisions become red or develop any drainage please call our office at [phone]  2. No Driving until cleared by Dr. Abran Duke office and you are no longer using narcotic pain medications  3. Monitor your weight daily.. Please use the same scale and weigh at same time... If you gain 5-10 lbs in 48 hours with associated lower extremity swelling, please contact our office at [phone]  4. Fever of 101.5 for at least 24 hours with no source, please contact our office at [phone]  5. Activity- up as tolerated, please walk at least 3 times per day.  Avoid strenuous activity, no lifting, pushing, or pulling with your arms over 8-10 lbs for a minimum of 6 weeks  6. If any questions or concerns arise, please do not hesitate to contact our office at [phone]  ------------------------------------------- Information on my medicine - ELIQUIS (apixaban)  Why was Eliquis prescribed for you? Eliquis was prescribed for you to reduce the risk of a blood clot forming that can cause a stroke if you have a medical condition called atrial fibrillation (a type of irregular heartbeat).  What do You need to know about Eliquis ? Take your Eliquis TWICE DAILY - one tablet in the morning and one tablet in the evening with or without food. If you have difficulty swallowing the tablet whole please discuss with your pharmacist how to take the medication safely.  Take Eliquis exactly as prescribed by your doctor and DO NOT stop taking Eliquis without talking to the doctor who prescribed the medication.  Stopping may increase your risk of developing a stroke.  Refill your prescription before you run out.  After discharge, you  should have regular check-up appointments with your healthcare provider that is prescribing your Eliquis.  In the future your dose may need to be changed if your kidney function or weight changes by a significant amount or as you get older.  What do you do if you miss a dose? If you miss a dose, take it as soon as you remember on the same day and resume taking twice daily.  Do not take more than one dose of ELIQUIS at the same time to make up a missed dose.  Important Safety Information A possible side effect of Eliquis is bleeding. You should call your healthcare provider right away if you experience any of the following: ? Bleeding from an injury or your nose that does not stop. ? Unusual colored urine (red or dark brown) or unusual colored stools (red or black). ? Unusual bruising for unknown reasons. ? A serious fall or if you hit your head (even if there is no bleeding).  Some medicines may interact with Eliquis and might increase your risk of bleeding or clotting while on Eliquis. To help avoid this, consult your healthcare provider or pharmacist prior to using any new prescription or non-prescription medications, including herbals, vitamins, non-steroidal anti-inflammatory drugs (NSAIDs) and supplements.  This website has more information on Eliquis (apixaban): http://www.eliquis.com/eliquis/home

## 2024-01-16 NOTE — Progress Notes (Signed)
 Cardiology Progress Note  Patient ID: Darin Mcclure MRN: [id number] DOB: [birth year] Date of Encounter: 01/16/2024 Primary Cardiologist: Donato Schultz, MD  Subjective   Chief Complaint: None.   HPI: Reports he slept well.  No chest pain or trouble breathing.  ROS:  All other ROS reviewed and negative. Pertinent positives noted in the HPI.     Telemetry  Overnight telemetry shows A-fib 90 to 150 bpm, which I personally reviewed.    Physical Exam   Vitals:   01/15/24 2327 01/16/24 0334 01/16/24 0620 01/16/24 0728  BP: 123/73 113/74  121/64  Pulse: 79 78  (!) 116  Resp: 17 20  16   Temp: 98.8 F (37.1 C) 98.2 F (36.8 C)  98.3 F (36.8 C)  TempSrc: Oral Oral  Oral  SpO2: 96% 94%  94%  Weight:   102 kg   Height:        Intake/Output Summary (Last 24 hours) at 01/16/2024 0833 Last data filed at 01/16/2024 0100 Gross per 24 hour  Intake 1165.79 ml  Output 2175 ml  Net -1009.21 ml       01/16/2024    6:20 AM 01/15/2024   12:10 PM 01/14/2024   10:31 PM  Last 3 Weights  Weight (lbs) 224 lb 13.9 oz 229 lb 8 oz 242 lb 15.2 oz  Weight (kg) 102 kg 104.1 kg 110.2 kg    Body mass index is 30.5 kg/m.  General: Well nourished, well developed, in no acute distress Head: Atraumatic, normal size  Eyes: PEERLA, EOMI  Neck: Supple, no JVD Endocrine: No thryomegaly Cardiac: Normal S1, S2; irregular rhythm, no murmurs Lungs: Clear to auscultation bilaterally, no wheezing, rhonchi or rales  Abd: Soft, nontender, no hepatomegaly  Ext: No edema, pulses 2+ Musculoskeletal: No deformities, BUE and BLE strength normal and equal Skin: Warm and dry, no rashes   Neuro: Alert and oriented to person, place, time, and situation, CNII-XII grossly intact, no focal deficits  Psych: Normal mood and affect   Cardiac Studies  LHC 01/15/2024   Prox RCA-1 lesion is 80% stenosed.   Prox RCA-2 lesion is 30% stenosed.   Mid RCA lesion is 30% stenosed.   Mid Cx lesion is 90% stenosed.   2nd Mrg lesion  is 90% stenosed.   Mid LAD lesion is 80% stenosed.   Dist LAD lesion is 70% stenosed.   2nd Diag lesion is 95% stenosed.   Severe mid LAD stenosis at the takeoff of a small caliber diagonal branch. The Diagonal branch has a severe stenosis The mid Circumflex has a severe stenosis involving the ostium of the large caliber obtuse marginal branch The large, dominant RCA is heavily calcified throughout the proximal and mid vessel. The proximal vessel has a heavily calcified severe stenosis. The stent in the mid vessel is patent with minimal restenosis.  Normal LV filling pressure.   TTE 01/15/2024  1. Left ventricular ejection fraction, by estimation, is 65 to 70%. The  left ventricle has normal function. The left ventricle has no regional  wall motion abnormalities. There is moderate left ventricular hypertrophy.  Left ventricular diastolic  parameters are indeterminate.   2. Right ventricular systolic function is normal. The right ventricular  size is normal. There is normal pulmonary artery systolic pressure. The  estimated right ventricular systolic pressure is 24.9 mmHg.   3. Left atrial size was moderately dilated.   4. The mitral valve is degenerative. Trivial mitral valve regurgitation.  Mild to moderate mitral stenosis. The mean mitral valve  gradient is 5.0  mmHg. Severe mitral annular calcification.   5. The aortic valve is normal in structure. Aortic valve regurgitation is  not visualized. No aortic stenosis is present.   6. The inferior vena cava is normal in size with greater than 50%  respiratory variability, suggesting right atrial pressure of 3 mmHg.   Patient Profile  Darin Mcclure is a 83 y.o. male with CAD status post PCI to the RCA in 2007, hypertension, hyperlipidemia, diabetes, right bundle branch block who was admitted on 01/14/2024 for new onset A-fib shortness of breath and chest pain.  Found to have influenza and concerns for congestive heart failure.   Assessment &  Plan   # Influenza A # Elevated troponin, demand # Elevated BNP -Admitted yesterday with cough and congestion for 2 weeks.  Describes shortness of breath and chest discomfort. -Elevated BNP and given Lasix.  Left heart cath shows normal LVEDP.  -Chest x-ray with concerns for interstitial edema but this is rapidly improved. -Echo showed normal LV function. -Found to have three-vessel CAD.  He will need bypass.  I do not believe he needs to wait in house for this.  He had demand and chest discomfort.  I think he could go home and recover from the flu and then come back.   -Echo shows normal LV function. -New A-fib.  Discussed below. -It looks like he did receive Plavix on 01/15/2024.  Would need at least 5-day washout anyway. -I am going to decrease his losartan to 50 mg daily.  Add Imdur 30 mg daily.  We are increasing his metoprolol to 100 mg of succinate twice daily.  This should be appropriate antianginal therapy. -Continue aspirin and Lipitor 80 mg daily. -We are stopping heparin and going to start Eliquis. -We will await final timing of surgery to the surgeon.  However I think he could go home and come back. -Would recommend Lasix 20 mg daily as needed when going home.  Does not believe he needs a standing dose.  # New onset A-fib -Increase metoprolol succinate to 100 mg twice daily. -Add Eliquis 5 mg twice daily.  He will need to stop this 2 days before CABG. -Would recommend surgical maze and left atrial appendage clipping at the time of surgery.  # HTN -Reduce losartan to 50 mg daily.  Add Imdur 30 mg daily.  Increasing metoprolol succinate to 100 mg twice daily.      For questions or updates, please contact [facility] Please consult [email or web address] for contact info under      Signed, Gerri Spore T. Flora Lipps, MD, [facility]  [facility]  01/16/2024 8:33 AM

## 2024-01-16 NOTE — TOC CM/SW Note (Signed)
 Transition of Care [facility]) - Inpatient Brief Assessment   Patient Details  Name: Rodarius Kichline MRN: [id number] Date of Birth: 1940-12-09  Transition of Care [facility]) CM/SW Contact:    Harriet Masson, RN Phone Number: 01/16/2024, 1:55 PM   Clinical Narrative: New onset atrial fibrillation. No TOC needs at this time.    Transition of Care Asessment: Insurance and Status: Insurance coverage has been reviewed Patient has primary care physician: Yes Home environment has been reviewed: safe to discharge home Prior level of function:: independent Prior/Current Home Services: No current home services Social Drivers of Health Review: SDOH reviewed no interventions necessary Readmission risk has been reviewed: Yes Transition of care needs: no transition of care needs at this time

## 2024-01-16 NOTE — Telephone Encounter (Signed)
 Patient Product/process development scientist completed.    The patient is insured through [employer]. Patient has [employer], may use a copay card, and/or apply for patient assistance if available.    Ran test claim for Eliquis 5 mg and the current 30 day co-pay is $100.94.  Ran test claim for Xarelto 20 mg and the current 30 day co-pay is $86.97  This test claim was processed through [employer]- copay amounts may vary at other pharmacies due to [employer], or as the patient moves through the different stages of their insurance plan.     Darin Mcclure, CPHT Pharmacy Technician III Certified Patient Advocate [facility] Patient Advocate Team Direct Number: [phone]  Fax: [phone]

## 2024-01-17 DIAGNOSIS — I4891 Unspecified atrial fibrillation: Secondary | ICD-10-CM | POA: Diagnosis not present

## 2024-01-17 DIAGNOSIS — I2510 Atherosclerotic heart disease of native coronary artery without angina pectoris: Secondary | ICD-10-CM | POA: Diagnosis not present

## 2024-01-17 DIAGNOSIS — J101 Influenza due to other identified influenza virus with other respiratory manifestations: Secondary | ICD-10-CM | POA: Diagnosis not present

## 2024-01-17 DIAGNOSIS — E1159 Type 2 diabetes mellitus with other circulatory complications: Secondary | ICD-10-CM | POA: Diagnosis not present

## 2024-01-17 DIAGNOSIS — N1831 Chronic kidney disease, stage 3a: Secondary | ICD-10-CM

## 2024-01-17 LAB — GLUCOSE, CAPILLARY
Glucose-Capillary: 146 mg/dL — ABNORMAL HIGH (ref 70–99)
Glucose-Capillary: 143 mg/dL — ABNORMAL HIGH (ref 70–99)
Glucose-Capillary: 151 mg/dL — ABNORMAL HIGH (ref 70–99)
Glucose-Capillary: 163 mg/dL — ABNORMAL HIGH (ref 70–99)
Glucose-Capillary: 124 mg/dL — ABNORMAL HIGH (ref 70–99)
Glucose-Capillary: 148 mg/dL — ABNORMAL HIGH (ref 70–99)
Glucose-Capillary: 122 mg/dL — ABNORMAL HIGH (ref 70–99)

## 2024-01-17 LAB — URINALYSIS, ROUTINE W REFLEX MICROSCOPIC
Bilirubin Urine: NEGATIVE
Glucose, UA: NEGATIVE mg/dL
Hgb urine dipstick: NEGATIVE
Ketones, ur: NEGATIVE mg/dL
Leukocytes,Ua: NEGATIVE
Nitrite: NEGATIVE
Protein, ur: NEGATIVE mg/dL
Specific Gravity, Urine: 1.008 (ref 1.005–1.030)
pH: 7 (ref 5.0–8.0)

## 2024-01-17 LAB — BASIC METABOLIC PANEL
Anion gap: 10 (ref 5–15)
BUN: 34 mg/dL — ABNORMAL HIGH (ref 8–23)
CO2: 27 mmol/L (ref 22–32)
Calcium: 8.9 mg/dL (ref 8.9–10.3)
Chloride: 100 mmol/L (ref 98–111)
Creatinine, Ser: 1.74 mg/dL — ABNORMAL HIGH (ref 0.61–1.24)
GFR, Estimated: 39 mL/min — ABNORMAL LOW (ref >60)
Glucose, Bld: 156 mg/dL — ABNORMAL HIGH (ref 70–99)
Potassium: 4 mmol/L (ref 3.5–5.1)
Sodium: 137 mmol/L (ref 135–145)

## 2024-01-17 MED ORDER — HEPARIN (PORCINE) 25000 UT/250ML-% IV SOLN
1400.0000 [IU]/h | INTRAVENOUS | Status: DC
  Administered 2024-01-18 – 2024-01-19 (×3): 1400 [IU]/h via INTRAVENOUS
  Filled 2024-01-17 (×4): qty 250

## 2024-01-17 MED ORDER — METOPROLOL SUCCINATE ER 50 MG PO TB24
50.0000 mg | ORAL_TABLET | Freq: Every day | ORAL | Status: DC
  Administered 2024-01-18 – 2024-01-19 (×2): 50 mg via ORAL
  Filled 2024-01-17 (×2): qty 1

## 2024-01-17 MED ORDER — DILTIAZEM HCL ER COATED BEADS 180 MG PO CP24
180.0000 mg | ORAL_CAPSULE | Freq: Every day | ORAL | Status: DC
Start: 2024-01-17 — End: 2024-01-17
  Administered 2024-01-17: 180 mg via ORAL
  Filled 2024-01-17: qty 1

## 2024-01-17 MED ORDER — DILTIAZEM HCL ER COATED BEADS 120 MG PO CP24
120.0000 mg | ORAL_CAPSULE | Freq: Every day | ORAL | Status: DC
  Administered 2024-01-18 – 2024-01-20 (×3): 120 mg via ORAL
  Filled 2024-01-17 (×3): qty 1

## 2024-01-17 MED ORDER — APIXABAN 2.5 MG PO TABS
2.5000 mg | ORAL_TABLET | Freq: Two times a day (BID) | ORAL | Status: AC
  Administered 2024-01-17: 2.5 mg via ORAL
  Filled 2024-01-17: qty 1

## 2024-01-17 NOTE — Consult Note (Signed)
 Nephrology Consult   Requesting provider: Hartley Barefoot  Service requesting consult: Hospitalist Reason for consult: AKI on CKD 3a   Assessment/Recommendations: Darin Mcclure is a/an 83 y.o. male with a past medical history CAD, DM2, HTN, HLD, CKD 3 AA who presents with NSTEMI  Non-Oliguric AKI on CKD 3a: BL Crt 1.2-1.4. AKI mild 2/2 hypotension and contrast.  Also recently received losartan.  Recent UA bland -Supportive care only -Hold ARB -Continue to monitor daily Cr, Dose meds for GFR -Monitor Daily I/Os, Daily weight  -Maintain MAP>65 for optimal renal perfusion.  -Avoid nephrotoxic medications including NSAIDs -Use synthetic opioids (Fentanyl/Dilaudid) if needed -Currently no indication for HD  Atrial fibrillation with RVR: Cardiology following.  Heart rates improved  NSTEMI/CAD: LHC on 2/19.  Surgery following.  Will need CABG  Influenza: Management per primary team  Type 2 diabetes: Management per primary team   Recommendations conveyed to primary service.    [name] 01/17/2024 10:39 AM   _____________________________________________________________________________________ CC: Chest discomfort  History of Present Illness: Darin Mcclure is a/an 83 y.o. male with a past medical history of CAD, DM2, HTN, HLD, CKD 3 AA who presents with chest pain.  Patient states for about 3 weeks he was dealing with a cough.  Saw his primary care doctor who tested them and he was negative for the flu and COVID at that time.  Treated him with an antibiotic as well as prednisone.  For couple days prior to arrival the patient started to feel unwell.  Had some chest discomfort that he thought was GERD.  Took some over-the-counter medications without significant improvement.  Denies any fevers, chills, nausea, vomiting, diarrhea, dysuria, hematuria.  In the emergency department he was found to be positive for the flu.  He was also found to have A-fib  with RVR.  Underwent left heart catheterization on 2/19 that showed three-vessel disease.  Creatinine at baseline is around 1.2-1.4.  Creatinine 1.6 yesterday and 1.7 today.  Patient feels like he is urinating normally.  Currently denies any shortness of breath or chest pain.   Medications:  Current Facility-Administered Medications  Medication Dose Route Frequency Provider Last Rate Last Admin   acetaminophen (TYLENOL) tablet 650 mg  650 mg Oral Q6H PRN Charlsie Quest, MD       Or   acetaminophen (TYLENOL) suppository 650 mg  650 mg Rectal Q6H PRN Charlsie Quest, MD       apixaban (ELIQUIS) tablet 5 mg  5 mg Oral BID Sande Rives, MD   5 mg at 01/17/24 8295   aspirin EC tablet 81 mg  81 mg Oral QHS Charlsie Quest, MD   81 mg at 01/16/24 2113   atorvastatin (LIPITOR) tablet 40 mg  40 mg Oral QHS Sande Rives, MD   40 mg at 01/16/24 2112   diltiazem (CARDIZEM CD) 24 hr capsule 180 mg  180 mg Oral Daily Sande Rives, MD   180 mg at 01/17/24 6213   famotidine (PEPCID) tablet 20 mg  20 mg Oral Daily PRN Regalado, Belkys A, MD       insulin aspart (novoLOG) injection 0-9 Units  0-9 Units Subcutaneous TID WC Charlsie Quest, MD   2 Units at 01/17/24 0865   isosorbide mononitrate (IMDUR) 24 hr tablet 30 mg  30 mg Oral Daily Sande Rives, MD   30 mg at 01/17/24 7846   lactated ringers infusion   Intravenous Continuous Regalado, Prentiss Bells, MD  100 mL/hr at 01/17/24 0700 Infusion Verify at 01/17/24 0700   levothyroxine (SYNTHROID) tablet 75 mcg  75 mcg Oral Daily Charlsie Quest, MD   75 mcg at 01/17/24 0640   metoprolol succinate (TOPROL-XL) 24 hr tablet 100 mg  100 mg Oral BID Sande Rives, MD   100 mg at 01/17/24 0102   metoprolol tartrate (LOPRESSOR) injection 5 mg  5 mg Intravenous Q4H PRN Charlsie Quest, MD   5 mg at 01/16/24 1120   ondansetron (ZOFRAN) tablet 4 mg  4 mg Oral Q6H PRN Charlsie Quest, MD       Or   ondansetron (ZOFRAN) injection 4 mg   4 mg Intravenous Q6H PRN Charlsie Quest, MD       Oral care mouth rinse  15 mL Mouth Rinse PRN Regalado, Belkys A, MD       senna-docusate (Senokot-S) tablet 1 tablet  1 tablet Oral QHS PRN Darreld Mclean R, MD       sodium chloride flush (NS) 0.9 % injection 3 mL  3 mL Intravenous Q12H Darreld Mclean R, MD   3 mL at 01/16/24 2114   sodium chloride flush (NS) 0.9 % injection 3 mL  3 mL Intravenous Q12H Kathleene Hazel, MD   3 mL at 01/16/24 2114   sodium chloride flush (NS) 0.9 % injection 3 mL  3 mL Intravenous PRN Kathleene Hazel, MD         ALLERGIES Patient has no known allergies.  MEDICAL HISTORY 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) 08/26/2017   GERD (gastroesophageal reflux disease)    Heart disease    Hyperglycemia    Hypertension    Mixed hyperlipidemia    Muscle cramp 08/26/2017   Neuropathy    Osteoarthritis    RBBB    RBBB (right bundle branch block)    Type 2 diabetes mellitus (HCC)      SOCIAL HISTORY Social History   Socioeconomic History   Marital status: Single    Spouse name: Not on file   Number of children: 2   Years of education: Not on file   Highest education level: Not on file  Occupational History   Occupation: retired  Tobacco Use   Smoking status: Never   Smokeless tobacco: Never  Vaping Use   Vaping status: Never Used  Substance and Sexual Activity   Alcohol use: Yes    Comment: Rarely   Drug use: No   Sexual activity: Not on file  Other Topics Concern   Not on file  Social History Narrative   Retired as a Production designer, 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       Two Story Home    Right handed    Social Drivers of Health   Financial  Resource Strain: Low Risk  (12/30/2023)   Received [facility]   Overall Financial Resource Strain (CARDIA)    Difficulty of Paying Living Expenses: Not hard at all  Food Insecurity: No Food Insecurity (01/15/2024)   Hunger Vital Sign    Worried About Running Out of Food in the Last Year: Never true    Ran Out of Food in  the Last Year: Never true  Transportation Needs: No Transportation Needs (01/15/2024)   PRAPARE - Administrator, Civil Service (Medical): No    Lack of Transportation (Non-Medical): No  Physical Activity: Insufficiently Active (07/31/2023)   Received from [facility]   Exercise Vital Sign    Days of Exercise per Week: 4 days    Minutes of Exercise per Session: 20 min  Stress: No Stress Concern Present (07/31/2023)   Received from [place] [facility] - Occupational Stress Questionnaire    Feeling of Stress : Not at all  Social Connections: Socially Integrated (01/15/2024)   Social Connection and Isolation Panel [NHANES]    Frequency of Communication with Friends and Family: More than three times a week    Frequency of Social Gatherings with Friends and Family: More than three times a week    Attends Religious Services: 1 to 4 times per year    Active Member of [employer] or Organizations: Yes    Attends Engineer, structural: More than 4 times per year    Marital Status: Living with partner  Intimate Partner Violence: Not At Risk (01/15/2024)   Humiliation, Afraid, Rape, and Kick questionnaire    Fear of Current or Ex-Partner: No    Emotionally Abused: No    Physically Abused: No    Sexually Abused: No     FAMILY HISTORY Family History  Problem Relation Age of Onset   CAD Father    Lung cancer Father    Colon cancer Neg Hx    Esophageal cancer Neg Hx    Rectal cancer Neg Hx    Stomach cancer Neg Hx       Review of Systems: 12 systems reviewed Otherwise as per HPI, all other systems reviewed and  negative  Physical Exam: Vitals:   01/17/24 0754 01/17/24 0821  BP: (!) 112/53 95/60  Pulse:    Resp:    Temp: 97.6 F (36.4 C)   SpO2:     No intake/output data recorded.  Intake/Output Summary (Last 24 hours) at 01/17/2024 1039 Last data filed at 01/17/2024 0700 Gross per 24 hour  Intake 1466.46 ml  Output 1375 ml  Net 91.46 ml   General: well-appearing, no acute distress HEENT: anicteric sclera, oropharynx clear without lesions CV: regular rate, no rub, no peripheral edema, no murmur Lungs: clear to auscultation bilaterally, normal work of breathing Abd: soft, non-tender, non-distended Skin: no visible lesions or rashes Psych: alert, engaged, appropriate mood and affect Musculoskeletal: no obvious deformities Neuro: normal speech, no gross focal deficits   Test Results Reviewed Lab Results  Component Value Date   NA 137 01/17/2024   K 4.0 01/17/2024   CL 100 01/17/2024   CO2 27 01/17/2024   BUN 34 (H) 01/17/2024   CREATININE 1.74 (H) 01/17/2024   GFR 46.28 (L) 02/05/2022   CALCIUM 8.9 01/17/2024   ALBUMIN 3.7 01/16/2024    CBC Recent Labs  Lab 01/14/24 1810 01/15/24 0447 01/16/24 0923  WBC 9.9 8.4 10.0  HGB 14.2 13.3 16.2  HCT 42.1 40.0 46.7  MCV 91.1 92.0 89.0  PLT 202 187 224    I have reviewed all relevant outside healthcare records related to the patient's current hospitalization

## 2024-01-17 NOTE — Progress Notes (Signed)
 Cardiology Progress Note  Patient ID: Darin Mcclure MRN: [id number] DOB: Dec 02, 1940 Date of Encounter: 01/17/2024 Primary Cardiologist: Donato Schultz, MD  Subjective   Chief Complaint: none.   HPI: Denies CP or SOB.   ROS:  All other ROS reviewed and negative. Pertinent positives noted in the HPI.     Telemetry  Overnight telemetry shows Afib 60s, which I personally reviewed.   Physical Exam   Vitals:   01/17/24 0400 01/17/24 0500 01/17/24 0754 01/17/24 0821  BP: 120/65  (!) 112/53 95/60  Pulse: 78     Resp: 16     Temp:   97.6 F (36.4 C)   TempSrc:   Oral   SpO2: 96%     Weight:  103.5 kg    Height:        Intake/Output Summary (Last 24 hours) at 01/17/2024 1104 Last data filed at 01/17/2024 0700 Gross per 24 hour  Intake 1466.46 ml  Output 1375 ml  Net 91.46 ml       01/17/2024    5:00 AM 01/16/2024    6:20 AM 01/15/2024   12:10 PM  Last 3 Weights  Weight (lbs) 228 lb 2.8 oz 224 lb 13.9 oz 229 lb 8 oz  Weight (kg) 103.5 kg 102 kg 104.1 kg    Body mass index is 30.95 kg/m.  General: Well nourished, well developed, in no acute distress Head: Atraumatic, normal size  Eyes: PEERLA, EOMI  Neck: Supple, no JVD Endocrine: No thryomegaly Cardiac: Normal S1, S2; irregular rhythm Lungs: Clear to auscultation bilaterally, no wheezing, rhonchi or rales  Abd: Soft, nontender, no hepatomegaly  Ext: No edema, pulses 2+ Musculoskeletal: No deformities, BUE and BLE strength normal and equal Skin: Warm and dry, no rashes   Neuro: Alert and oriented to person, place, time, and situation, CNII-XII grossly intact, no focal deficits  Psych: Normal mood and affect   Cardiac Studies  LHC 01/15/2024    Prox RCA-1 lesion is 80% stenosed.   Prox RCA-2 lesion is 30% stenosed.   Mid RCA lesion is 30% stenosed.   Mid Cx lesion is 90% stenosed.   2nd Mrg lesion is 90% stenosed.   Mid LAD lesion is 80% stenosed.   Dist LAD lesion is 70% stenosed.   2nd Diag lesion is 95%  stenosed.   TTE 01/15/2024  1. Left ventricular ejection fraction, by estimation, is 65 to 70%. The  left ventricle has normal function. The left ventricle has no regional  wall motion abnormalities. There is moderate left ventricular hypertrophy.  Left ventricular diastolic  parameters are indeterminate.   2. Right ventricular systolic function is normal. The right ventricular  size is normal. There is normal pulmonary artery systolic pressure. The  estimated right ventricular systolic pressure is 24.9 mmHg.   3. Left atrial size was moderately dilated.   4. The mitral valve is degenerative. Trivial mitral valve regurgitation.  Mild to moderate mitral stenosis. The mean mitral valve gradient is 5.0  mmHg. Severe mitral annular calcification.   5. The aortic valve is normal in structure. Aortic valve regurgitation is  not visualized. No aortic stenosis is present.   6. The inferior vena cava is normal in size with greater than 50%  respiratory variability, suggesting right atrial pressure of 3 mmHg.    Patient Profile  Darin Mcclure is a 83 y.o. male with CAD status post PCI to the RCA in 2007, hypertension, hyperlipidemia, diabetes, right bundle branch block who was admitted on 01/14/2024 for new  onset A-fib shortness of breath and chest pain. Found to have influenza and concerns for congestive heart failure.   Assessment & Plan   # Influenza A # Demand ischemia # Multivessel CAD -Admitted with flu symptoms and found to have multivessel CAD.  Symptoms of flu have resolved.  Also with elevated BNP.  Was given diuresis. -Plan is for CABG on Wednesday. -Continue to hold Plavix.  Last dose of Plavix was 01/15/2024 -Continue aspirin 81 mg daily -A bit hypotensive and with AKI.  Hold losartan and Imdur -No further symptoms of angina. -Reducing metoprolol succinate to 50 mg daily.  On diltiazem as well.  Rates are much improved. -We will continue his Eliquis until Monday.  Then transition  to heparin drip.  He will need Eliquis held for 2 day before CABG.  Would like to avoid IV heparin over the weekend.  He will be able to get up and move around with this.  # Elevated BNP -Was given a one-time dose of Lasix.  Euvolemic.  No further Lasix.  As needed moving forward. -LVEDP was normal at the time of cath.  # Persistent A-fib -Blood pressures have been a bit soft.  Likely over diuresed.  Also with AKI. -Reduce dose of Eliquis to 2.5 mg twice daily based on age and kidney function. -Plan for rate control strategy.  Reduce dose of metoprolol succinate to 50 mg daily.  Continue diltiazem.  I have reduced the dose to 120 mg daily tomorrow. -Would recommend surgical maze and left atrial appendage clipping at the time of surgery. -Transition to heparin drip 2 days before CABG.  CABG is planned for Wednesday.  # AKI -AKI likely related to Lasix, hypotension and contrast load. -Still making urine. -Getting back fluids.  # HTN -Holding losartan and Imdur.  Reducing metoprolol succinate to 50 mg daily.      For questions or updates, please contact [facility] Please consult [email or web address] for contact info under       Signed, Gerri Spore T. Flora Lipps, MD, [facility]  [facility]  01/17/2024 11:04 AM

## 2024-01-17 NOTE — Progress Notes (Addendum)
 ANTICOAGULATION CONSULT NOTE  Pharmacy Consult for Heparin Indication: chest pain/ACS and atrial fibrillation  No Known Allergies  Patient Measurements: Height: 6' (182.9 cm) Weight: 103.5 kg (228 lb 2.8 oz) IBW/kg (Calculated) : 77.6 HEPARIN DW (KG): 99.1   Vital Signs: Temp: 97.8 F (36.6 C) (02/21 1109) Temp Source: Oral (02/21 1109) BP: 103/66 (02/21 1109) Pulse Rate: 78 (02/21 0400)  Labs: Recent Labs    01/14/24 1810 01/14/24 1817 01/14/24 2023 01/14/24 2233 01/15/24 0447 01/16/24 0923 01/17/24 0205  HGB 14.2  --   --   --  13.3 16.2  --   HCT 42.1  --   --   --  40.0 46.7  --   PLT 202  --   --   --  187 224  --   APTT  --   --   --  25  --   --   --   LABPROT  --   --   --  13.7  --   --   --   INR  --   --   --  1.0  --   --   --   HEPARINUNFRC  --   --   --   --  0.35  --   --   CREATININE  --    < >  --   --  1.23 1.55* 1.74*  TROPONINIHS 94*  --  94*  --   --   --   --    < > = values in this interval not displayed.    Estimated Creatinine Clearance: 41.4 mL/min (A) (by C-G formula based on SCr of 1.74 mg/dL (H)).   Medical History: 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) 08/26/2017   GERD (gastroesophageal reflux disease)    Heart disease    Hyperglycemia    Hypertension    Mixed hyperlipidemia    Muscle cramp 08/26/2017   Neuropathy    Osteoarthritis    RBBB    RBBB (right bundle branch block)    Type 2 diabetes mellitus (HCC)     Assessment: 71 yom with a history of CAD s/p DES, HTN, HLD, T2DM, hypothyroidism, RBBB. Patient is presenting with chest pain and AF. Heparin per pharmacy consult placed for afib/RVR.   He is s/p cath 2/19 with plans for CABG on 2/25. He was started on apixaban 2/20 and has remained inpatient with afib and AKI.  Plans are to continue apixaban until 2/22 then start IV heparin on 2/23  Goal of Therapy:  Heparin level 0.3-0.7 units/ml Monitor platelets by anticoagulation protocol: Yes   Plan:  -apixaban stops after the evening dose on 2/22 -Will follow patient progress and determine IV heparin orders on 2/23  Harland German, PharmD Clinical Pharmacist **Pharmacist phone directory can now be found on [email or web address] ([email or web address]).  Listed under [facility].  Addendum -With CrCl < 30 there is some consideration for holding apixaban 72 hours before surgery -With AKI and plans for CABG will hold apixaban starting tonight and start IV heprin at 10am on 2/22 (I discussed this with Dr. Flora Lipps) -He was on heparin on 2/19 and heparin level was at goal on 1400 units/hr  Plan -Discontinue apixaban after tonight's dose -Restart heparin 1400 units/he at 10am on 2/22 -aPTT and  heparin level in 8 hrs  Harland German, PharmD Clinical Pharmacist **Pharmacist phone directory can now be found on [email or web address] ([email or web address]).  Listed under [facility].

## 2024-01-17 NOTE — Progress Notes (Signed)
 Mobility Specialist Progress Note;   01/17/24 1525  Mobility  Activity Ambulated with assistance in hallway  Level of Assistance Contact guard assist, steadying assist  Assistive Device None  Distance Ambulated (ft) 810 ft  Activity Response Tolerated well  Mobility Referral Yes  Mobility visit 1 Mobility  Mobility Specialist Start Time (ACUTE ONLY) 1525  Mobility Specialist Stop Time (ACUTE ONLY) 1537  Mobility Specialist Time Calculation (min) (ACUTE ONLY) 12 min   Pt eager for mobility. Required light MinG assistance during ambulation for safety. Minor LOBs while ambulating, however pt able to self correct. VSS throughout ambulation and no c/o during session. Pt returned back to bed with all needs met. NT in room.   Caesar Bookman Mobility Specialist Please contact via SecureChat or [employer] [phone]

## 2024-01-17 NOTE — Progress Notes (Signed)
 PROGRESS NOTE    Darin Mcclure  NUU:[id number] DOB: 03-May-1941 DOA: 01/14/2024 PCP: Eartha Inch, MD   Brief Narrative: 83 year old with past medical history significant for CAD status post DES to mid RCA 2007, RBBB, diabetes type 2, hypertension, CKD stage IIIa, hypothyroidism, hyperlipidemia presented for evaluation of chest discomfort and shortness of breath.  2 weeks ago he was treated for upper respiratory infection sinusitis for cough.  He completed course of antibiotics and a course of prednisone.  The day prior to admission he developed chest pain on exertion.  Presented for further evaluation  BNP elevated on admission at 666, troponin at 94 x 2.  Influenza A positive.  Chest x-ray negative for consolidation edema or effusion.  Cardiology was consulted for A-fib and RVR and for their evaluation of chest pain.  Underwent Catheterization 2/19,showed three vessels CAD. CVTS have been consulted.   Assessment & Plan:   Principal Problem:   New onset atrial fibrillation (HCC) Active Problems:   Coronary artery disease involving native coronary artery of native heart without angina pectoris   Influenza A   Type 2 diabetes mellitus (HCC)   Hypertension associated with diabetes (HCC)   Chronic kidney disease, stage 3a (HCC)   Hypothyroidism   Hyperlipidemia associated with type 2 diabetes mellitus (HCC)   1-new onset of A-fib with RVR -Patient with heart rate in the 150 irregular. -Was a started on heparin drip---Transition to  Eliquis  -IV as needed metoprolol -Cardiology Following  -ECHO: Action fraction 65 to 70%, no wall motion abnormality.  Right ventricular systolic function is normal.  Left ventricular diastolic parameters are indeterminate. -2/20:HR remain elevated, cardiology have added Cardizem -BP have been soft--- Metoprolol change to daily and Cardizem to 120 daily. Plan to transition to Heparin gtt on Monday in anticipation for surgery on Wednesday   2-Chest  pain, STEMI History of CAD status post DES to RCA 2007 Troponin mildly elevated at 90 Continue with a statin, monitor liver function test Underwent Cath 2/19: showed three vessel CAD> He will Need CABG. Plan for surgery on Wednesday  Lipitor increased to 40 mg.   Imdur will be held due to soft BP He report an episode of chest pressure this am, pain less frequent.   Influenza A Denies worsening cough, chest x-ray negative for pneumonia. He reports influenza-like symptoms 2 weeks ago, of note 2 weeks ago he was tested negative. Pro-calcitonin  less than 10, will hold on starting antibiotics at this time Repeated test negative. He likely recover from Influenza.   AKI; on CKD stage 3A Insetting of Hypotension and Contrast for cath./ Hold Cozaar.  Nephrology consulted Continue with IV fluids Avoid Hypotension  Hypertension: Continue metoprolol. Hold losartan due to AKI   Diabetes type 2: Continue to hold metformin and Januvia.  Sliding scale insulin of note he did received dose of metformin last night in the  ED. Will need to hold Metformin for 48 hours after cath.   Hypothyroidism: Continue with Synthroid  HLD: Continue with atorvastatin   Estimated body mass index is 30.95 kg/m as calculated from the following:   Height as of this encounter: 6' (1.829 m).   Weight as of this encounter: 103.5 kg.   DVT prophylaxis: Heparin gtt Code Status: full code Family Communication: Son who was at bedside.  Disposition Plan:  Status is: Observation The patient remains OBS appropriate and will d/c before 2 midnights.    Consultants:  Cardiology   Procedures:  ECHO  Antimicrobials:  Subjective: He report mild chest pressure this am. Pain overall is getting better and less frequent. He was able to ambulate in the unit without any shortness of breath or chest pain.   Objective: Vitals:   01/17/24 0400 01/17/24 0500 01/17/24 0754 01/17/24 0821  BP: 120/65  (!) 112/53  95/60  Pulse: 78     Resp: 16     Temp:   97.6 F (36.4 C)   TempSrc:   Oral   SpO2: 96%     Weight:  103.5 kg    Height:        Intake/Output Summary (Last 24 hours) at 01/17/2024 1048 Last data filed at 01/17/2024 0700 Gross per 24 hour  Intake 1466.46 ml  Output 1375 ml  Net 91.46 ml   Filed Weights   01/15/24 1210 01/16/24 0620 01/17/24 0500  Weight: 104.1 kg 102 kg 103.5 kg    Examination:  General exam: NAD Respiratory system: CTA Cardiovascular system: S 1, S 2 RRR Gastrointestinal system: BS present, soft, nt Central nervous system: Alert. Extremities: No edema  Data Reviewed: I have personally reviewed following labs and imaging studies  CBC: Recent Labs  Lab 01/14/24 1810 01/15/24 0447 01/16/24 0923  WBC 9.9 8.4 10.0  HGB 14.2 13.3 16.2  HCT 42.1 40.0 46.7  MCV 91.1 92.0 89.0  PLT 202 187 224   Basic Metabolic Panel: Recent Labs  Lab 01/14/24 1817 01/15/24 0447 01/16/24 0923 01/17/24 0205  NA 138 135 139 137  K 4.4 4.4 4.2 4.0  CL 103 102 102 100  CO2 24 21* 23 27  GLUCOSE 184* 154* 181* 156*  BUN 30* 28* 30* 34*  CREATININE 1.48* 1.23 1.55* 1.74*  CALCIUM 9.8 9.2 9.5 8.9  MG  --  1.8 1.8  --    GFR: Estimated Creatinine Clearance: 41.4 mL/min (A) (by C-G formula based on SCr of 1.74 mg/dL (H)). Liver Function Tests: Recent Labs  Lab 01/14/24 1817 01/15/24 0447 01/16/24 0923  AST 84* 43* 25  ALT 78* 60* 49*  ALKPHOS 49 39 44  BILITOT 0.8 0.9 1.1  PROT 7.0 6.2* 6.9  ALBUMIN 3.7 3.3* 3.7   No results for input(s): "LIPASE", "AMYLASE" in the last 168 hours. No results for input(s): "AMMONIA" in the last 168 hours. Coagulation Profile: Recent Labs  Lab 01/14/24 2233  INR 1.0   Cardiac Enzymes: No results for input(s): "CKTOTAL", "CKMB", "CKMBINDEX", "TROPONINI" in the last 168 hours. BNP (last 3 results) No results for input(s): "PROBNP" in the last 8760 hours. HbA1C: Recent Labs    01/14/24 2233  HGBA1C 7.1*    CBG: Recent Labs  Lab 01/16/24 1135 01/16/24 1557 01/16/24 2111 01/17/24 0600 01/17/24 0758  GLUCAP 177* 146* 143* 151* 163*   Lipid Profile: Recent Labs    01/16/24 0923  CHOL 117  HDL 35*  LDLCALC 16  TRIG 161*  CHOLHDL 3.3   Thyroid Function Tests: Recent Labs    01/15/24 0447  TSH 3.144   Anemia Panel: No results for input(s): "VITAMINB12", "FOLATE", "FERRITIN", "TIBC", "IRON", "RETICCTPCT" in the last 72 hours. Sepsis Labs: Recent Labs  Lab 01/15/24 1012  PROCALCITON <0.10    Recent Results (from the past 240 hours)  Resp panel by RT-PCR (RSV, Flu A&B, Covid) Anterior Nasal Swab     Status: Abnormal   Collection Time: 01/14/24  6:10 PM   Specimen: Anterior Nasal Swab  Result Value Ref Range Status   SARS Coronavirus 2 by RT PCR NEGATIVE NEGATIVE  Final   Influenza A by PCR POSITIVE (A) NEGATIVE Final   Influenza B by PCR NEGATIVE NEGATIVE Final    Comment: (NOTE) The Xpert Xpress SARS-CoV-2/FLU/RSV plus assay is intended as an aid in the diagnosis of influenza from Nasopharyngeal swab specimens and should not be used as a sole basis for treatment. Nasal washings and aspirates are unacceptable for Xpert Xpress SARS-CoV-2/FLU/RSV testing.  Fact Sheet for Patients: BloggerCourse.com  Fact Sheet for Healthcare Providers: SeriousBroker.it  This test is not yet approved or cleared by the [place] FDA and has been authorized for detection and/or diagnosis of SARS-CoV-2 by FDA under an Emergency Use Authorization (EUA). This EUA will remain in effect (meaning this test can be used) for the duration of the COVID-19 declaration under Section 564(b)(1) of the Act, 21 U.S.C. section 360bbb-3(b)(1), unless the authorization is terminated or revoked.     Resp Syncytial Virus by PCR NEGATIVE NEGATIVE Final    Comment: (NOTE) Fact Sheet for Patients: BloggerCourse.com  Fact  Sheet for Healthcare Providers: SeriousBroker.it  This test is not yet approved or cleared by the [place] FDA and has been authorized for detection and/or diagnosis of SARS-CoV-2 by FDA under an Emergency Use Authorization (EUA). This EUA will remain in effect (meaning this test can be used) for the duration of the COVID-19 declaration under Section 564(b)(1) of the Act, 21 U.S.C. section 360bbb-3(b)(1), unless the authorization is terminated or revoked.  Performed at [facility], [address] [place], [place] [address]   MRSA Next Gen by PCR, Nasal     Status: None   Collection Time: 01/15/24 12:32 PM   Specimen: Nasal Mucosa; Nasal Swab  Result Value Ref Range Status   MRSA by PCR Next Gen NOT DETECTED NOT DETECTED Final    Comment: (NOTE) The GeneXpert MRSA Assay (FDA approved for NASAL specimens only), is one component of a comprehensive MRSA colonization surveillance program. It is not intended to diagnose MRSA infection nor to guide or monitor treatment for MRSA infections. Test performance is not FDA approved in patients less than 40 years old. Performed at [facility], [address] [place], [place] [address]   Resp panel by RT-PCR (RSV, Flu A&B, Covid) Anterior Nasal Swab     Status: None   Collection Time: 01/16/24  9:27 AM   Specimen: Anterior Nasal Swab  Result Value Ref Range Status   SARS Coronavirus 2 by RT PCR NEGATIVE NEGATIVE Final   Influenza A by PCR NEGATIVE NEGATIVE Final   Influenza B by PCR NEGATIVE NEGATIVE Final    Comment: (NOTE) The Xpert Xpress SARS-CoV-2/FLU/RSV plus assay is intended as an aid in the diagnosis of influenza from Nasopharyngeal swab specimens and should not be used as a sole basis for treatment. Nasal washings and aspirates are unacceptable for Xpert Xpress SARS-CoV-2/FLU/RSV testing.  Fact Sheet for Patients: BloggerCourse.com  Fact Sheet for Healthcare  Providers: SeriousBroker.it  This test is not yet approved or cleared by the [place] FDA and has been authorized for detection and/or diagnosis of SARS-CoV-2 by FDA under an Emergency Use Authorization (EUA). This EUA will remain in effect (meaning this test can be used) for the duration of the COVID-19 declaration under Section 564(b)(1) of the Act, 21 U.S.C. section 360bbb-3(b)(1), unless the authorization is terminated or revoked.     Resp Syncytial Virus by PCR NEGATIVE NEGATIVE Final    Comment: (NOTE) Fact Sheet for Patients: BloggerCourse.com  Fact Sheet for Healthcare Providers: SeriousBroker.it  This test is not yet approved or cleared by the [place] and has been authorized for detection and/or diagnosis of SARS-CoV-2 by FDA under an Emergency Use Authorization (EUA). This EUA will remain in effect (meaning this test can be used) for the duration of the COVID-19 declaration under Section 564(b)(1) of the Act, 21 U.S.C. section 360bbb-3(b)(1), unless the authorization is terminated or revoked.  Performed at [facility], [address] [place], [place] [address]          Radiology Studies: CARDIAC CATHETERIZATION Result Date: 01/15/2024   Prox RCA-1 lesion is 80% stenosed.   Prox RCA-2 lesion is 30% stenosed.   Mid RCA lesion is 30% stenosed.   Mid Cx lesion is 90% stenosed.   2nd Mrg lesion is 90% stenosed.   Mid LAD lesion is 80% stenosed.   Dist LAD lesion is 70% stenosed.   2nd Diag lesion is 95% stenosed. Severe mid LAD stenosis at the takeoff of a small caliber diagonal branch. The Diagonal branch has a severe stenosis The mid Circumflex has a severe stenosis involving the ostium of the large caliber obtuse marginal branch The large, dominant RCA is heavily calcified throughout the proximal and mid vessel. The proximal vessel has a heavily calcified severe stenosis.  The stent in the mid vessel is patent with minimal restenosis. Normal LV filling pressure. Recommendations: Will consult CT surgery for consideration of CABG which I feel is the best revascularization strategy in this very active, diabetic male. Continue ASA and statin. Resume IV heparin 2 hours post radial band removal.        Scheduled Meds:  apixaban  5 mg Oral BID   aspirin EC  81 mg Oral QHS   atorvastatin  40 mg Oral QHS   diltiazem  180 mg Oral Daily   insulin aspart  0-9 Units Subcutaneous TID WC   isosorbide mononitrate  30 mg Oral Daily   levothyroxine  75 mcg Oral Daily   metoprolol succinate  100 mg Oral BID   sodium chloride flush  3 mL Intravenous Q12H   sodium chloride flush  3 mL Intravenous Q12H   Continuous Infusions:  lactated ringers 100 mL/hr at 01/17/24 0700     LOS: 2 days    Time spent: 35 minutes    Moneisha Vosler A Shamyah Stantz, MD [facility]   If 7PM-7AM, please contact night-coverage [email or web address]  01/17/2024, 10:48 AM

## 2024-01-18 DIAGNOSIS — I200 Unstable angina: Secondary | ICD-10-CM | POA: Diagnosis not present

## 2024-01-18 DIAGNOSIS — I4891 Unspecified atrial fibrillation: Secondary | ICD-10-CM | POA: Diagnosis not present

## 2024-01-18 LAB — HEPATIC FUNCTION PANEL
ALT: 35 U/L (ref 0–44)
AST: 25 U/L (ref 15–41)
Albumin: 3.7 g/dL (ref 3.5–5.0)
Alkaline Phosphatase: 40 U/L (ref 38–126)
Bilirubin, Direct: 0.2 mg/dL (ref 0.0–0.2)
Indirect Bilirubin: 1 mg/dL — ABNORMAL HIGH (ref 0.3–0.9)
Total Bilirubin: 1.2 mg/dL (ref 0.0–1.2)
Total Protein: 6.9 g/dL (ref 6.5–8.1)

## 2024-01-18 LAB — BASIC METABOLIC PANEL
Anion gap: 12 (ref 5–15)
BUN: 23 mg/dL (ref 8–23)
CO2: 26 mmol/L (ref 22–32)
Calcium: 9.3 mg/dL (ref 8.9–10.3)
Chloride: 101 mmol/L (ref 98–111)
Creatinine, Ser: 1.47 mg/dL — ABNORMAL HIGH (ref 0.61–1.24)
GFR, Estimated: 48 mL/min — ABNORMAL LOW (ref >60)
Glucose, Bld: 178 mg/dL — ABNORMAL HIGH (ref 70–99)
Potassium: 4.1 mmol/L (ref 3.5–5.1)
Sodium: 139 mmol/L (ref 135–145)

## 2024-01-18 LAB — GLUCOSE, CAPILLARY
Glucose-Capillary: 149 mg/dL — ABNORMAL HIGH (ref 70–99)
Glucose-Capillary: 181 mg/dL — ABNORMAL HIGH (ref 70–99)
Glucose-Capillary: 138 mg/dL — ABNORMAL HIGH (ref 70–99)
Glucose-Capillary: 146 mg/dL — ABNORMAL HIGH (ref 70–99)

## 2024-01-18 LAB — CBC
HCT: 35.9 % — ABNORMAL LOW (ref 39.0–52.0)
Hemoglobin: 12.7 g/dL — ABNORMAL LOW (ref 13.0–17.0)
MCH: 31.4 pg (ref 26.0–34.0)
MCHC: 35.4 g/dL (ref 30.0–36.0)
MCV: 88.9 fL (ref 80.0–100.0)
Platelets: 174 10*3/uL (ref 150–400)
RBC: 4.04 MIL/uL — ABNORMAL LOW (ref 4.22–5.81)
RDW: 13 % (ref 11.5–15.5)
WBC: 6.6 10*3/uL (ref 4.0–10.5)
nRBC: 0.5 % — ABNORMAL HIGH (ref 0.0–0.2)

## 2024-01-18 LAB — APTT: aPTT: 68 s — ABNORMAL HIGH (ref 24–36)

## 2024-01-18 LAB — HEPARIN LEVEL (UNFRACTIONATED): Heparin Unfractionated: >1.1 [IU]/mL — ABNORMAL HIGH (ref 0.30–0.70)

## 2024-01-18 NOTE — Progress Notes (Signed)
 PROGRESS NOTE    Darin Mcclure  WUJ:[id number] DOB: 1941-03-07 DOA: 01/14/2024 PCP: Eartha Inch, MD   Brief Narrative: 83 year old with past medical history significant for CAD status post DES to mid RCA 2007, RBBB, diabetes type 2, hypertension, CKD stage IIIa, hypothyroidism, hyperlipidemia presented for evaluation of chest discomfort and shortness of breath.  2 weeks ago he was treated for upper respiratory infection sinusitis for cough.  He completed course of antibiotics and a course of prednisone.  The day prior to admission he developed chest pain on exertion.  Presented for further evaluation  BNP elevated on admission at 666, troponin at 94 x 2.  Influenza A positive.  Chest x-ray negative for consolidation edema or effusion.  Cardiology was consulted for A-fib and RVR and for their evaluation of chest pain.  Underwent Catheterization 2/19,showed three vessels CAD. CVTS have been consulted, plan for surgery 2/25.    Assessment & Plan:   Principal Problem:   New onset atrial fibrillation (HCC) Active Problems:   Coronary artery disease involving native coronary artery of native heart without angina pectoris   Influenza A   Type 2 diabetes mellitus (HCC)   Hypertension associated with diabetes (HCC)   Chronic kidney disease, stage 3a (HCC)   Hypothyroidism   Hyperlipidemia associated with type 2 diabetes mellitus (HCC)   1-New onset of A-fib with RVR -Patient with heart rate in the 150 irregular. -IV as needed metoprolol -Cardiology Following  -ECHO: Action fraction 65 to 70%, no wall motion abnormality.  Right ventricular systolic function is normal.  Left ventricular diastolic parameters are indeterminate. -2/20:HR remain elevated, cardiology have added Cardizem -BP have been soft--- Metoprolol change to daily and Cardizem to 120 daily. HR stable.  Eliquis change to IV heparin.  2-Chest pain, STEMI History of CAD status post DES to RCA 2007 Troponin mildly  elevated at 90 Continue with a statin, monitor liver function test Underwent Cath 2/19: showed three vessel CAD> He will Need CABG. Plan for surgery on Tuesday. Lipitor increased to 40 mg.   Imdur will be held due to soft BP Had transient episode of chest pain. EKG ordered. For Sx on Tuesday.   Influenza A Denies worsening cough, chest x-ray negative for pneumonia. He reports influenza-like symptoms 2 weeks ago, of note 2 weeks ago he was tested negative. Pro-calcitonin  less than 10, will hold on starting antibiotics at this time Repeated test negative. He likely recover from Influenza.   AKI; on CKD stage 3A Insetting of Hypotension and Contrast for cath./ Hold Cozaar.  Nephrology consulted Avoid Hypotension Improved with IV fluids.  Hypertension: Continue metoprolol. Hold losartan due to AKI   Diabetes type 2: Continue to hold metformin and Januvia.  Sliding scale insulin of note he did received dose of metformin last night in the  ED. Will need to hold Metformin for 48 hours after cath.   Hypothyroidism: Continue with Synthroid  HLD: Continue with atorvastatin   Estimated body mass index is 30.74 kg/m as calculated from the following:   Height as of this encounter: 6' (1.829 m).   Weight as of this encounter: 102.8 kg.   DVT prophylaxis: Heparin gtt Code Status: full code Family Communication: Son who was at bedside 2/21 Disposition Plan:  Status is: Observation The patient remains OBS appropriate and will d/c before 2 midnights.    Consultants:  Cardiology   Procedures:  ECHO  Antimicrobials:    Subjective: Report chest pain transiently this am.  Objective: Vitals:  01/18/24 0404 01/18/24 0800 01/18/24 0811 01/18/24 1120  BP: 119/61 (!) 116/46 (!) 116/46 126/71  Pulse: 69  73 66  Resp: 13   18  Temp: 98.7 F (37.1 C) 98 F (36.7 C)  97.6 F (36.4 C)  TempSrc: Oral Oral  Oral  SpO2: 95%   93%  Weight: 102.8 kg     Height:         Intake/Output Summary (Last 24 hours) at 01/18/2024 1129 Last data filed at 01/18/2024 9604 Gross per 24 hour  Intake 1026.25 ml  Output 250 ml  Net 776.25 ml   Filed Weights   01/16/24 0620 01/17/24 0500 01/18/24 0404  Weight: 102 kg 103.5 kg 102.8 kg    Examination:  General exam: NAD Respiratory system: CTA Cardiovascular system: S 1, S 2 RRR Gastrointestinal system: BS present, soft,nt Central nervous system: Alert. Extremities: No edema  Data Reviewed: I have personally reviewed following labs and imaging studies  CBC: Recent Labs  Lab 01/14/24 1810 01/15/24 0447 01/16/24 0923 01/18/24 0204  WBC 9.9 8.4 10.0 6.6  HGB 14.2 13.3 16.2 12.7*  HCT 42.1 40.0 46.7 35.9*  MCV 91.1 92.0 89.0 88.9  PLT 202 187 224 174   Basic Metabolic Panel: Recent Labs  Lab 01/14/24 1817 01/15/24 0447 01/16/24 0923 01/17/24 0205 01/18/24 0723  NA 138 135 139 137 139  K 4.4 4.4 4.2 4.0 4.1  CL 103 102 102 100 101  CO2 24 21* 23 27 26   GLUCOSE 184* 154* 181* 156* 178*  BUN 30* 28* 30* 34* 23  CREATININE 1.48* 1.23 1.55* 1.74* 1.47*  CALCIUM 9.8 9.2 9.5 8.9 9.3  MG  --  1.8 1.8  --   --    GFR: Estimated Creatinine Clearance: 48.9 mL/min (A) (by C-G formula based on SCr of 1.47 mg/dL (H)). Liver Function Tests: Recent Labs  Lab 01/14/24 1817 01/15/24 0447 01/16/24 0923 01/18/24 0723  AST 84* 43* 25 25  ALT 78* 60* 49* 35  ALKPHOS 49 39 44 40  BILITOT 0.8 0.9 1.1 1.2  PROT 7.0 6.2* 6.9 6.9  ALBUMIN 3.7 3.3* 3.7 3.7   No results for input(s): "LIPASE", "AMYLASE" in the last 168 hours. No results for input(s): "AMMONIA" in the last 168 hours. Coagulation Profile: Recent Labs  Lab 01/14/24 2233  INR 1.0   Cardiac Enzymes: No results for input(s): "CKTOTAL", "CKMB", "CKMBINDEX", "TROPONINI" in the last 168 hours. BNP (last 3 results) No results for input(s): "PROBNP" in the last 8760 hours. HbA1C: No results for input(s): "HGBA1C" in the last 72  hours.  CBG: Recent Labs  Lab 01/17/24 1111 01/17/24 1539 01/17/24 2000 01/18/24 0603 01/18/24 1121  GLUCAP 124* 148* 122* 149* 181*   Lipid Profile: Recent Labs    01/16/24 0923  CHOL 117  HDL 35*  LDLCALC 16  TRIG 540*  CHOLHDL 3.3   Thyroid Function Tests: No results for input(s): "TSH", "T4TOTAL", "FREET4", "T3FREE", "THYROIDAB" in the last 72 hours.  Anemia Panel: No results for input(s): "VITAMINB12", "FOLATE", "FERRITIN", "TIBC", "IRON", "RETICCTPCT" in the last 72 hours. Sepsis Labs: Recent Labs  Lab 01/15/24 1012  PROCALCITON <0.10    Recent Results (from the past 240 hours)  Resp panel by RT-PCR (RSV, Flu A&B, Covid) Anterior Nasal Swab     Status: Abnormal   Collection Time: 01/14/24  6:10 PM   Specimen: Anterior Nasal Swab  Result Value Ref Range Status   SARS Coronavirus 2 by RT PCR NEGATIVE NEGATIVE Final  Influenza A by PCR POSITIVE (A) NEGATIVE Final   Influenza B by PCR NEGATIVE NEGATIVE Final    Comment: (NOTE) The Xpert Xpress SARS-CoV-2/FLU/RSV plus assay is intended as an aid in the diagnosis of influenza from Nasopharyngeal swab specimens and should not be used as a sole basis for treatment. Nasal washings and aspirates are unacceptable for Xpert Xpress SARS-CoV-2/FLU/RSV testing.  Fact Sheet for Patients: BloggerCourse.com  Fact Sheet for Healthcare Providers: SeriousBroker.it  This test is not yet approved or cleared by the [place] FDA and has been authorized for detection and/or diagnosis of SARS-CoV-2 by FDA under an Emergency Use Authorization (EUA). This EUA will remain in effect (meaning this test can be used) for the duration of the COVID-19 declaration under Section 564(b)(1) of the Act, 21 U.S.C. section 360bbb-3(b)(1), unless the authorization is terminated or revoked.     Resp Syncytial Virus by PCR NEGATIVE NEGATIVE Final    Comment: (NOTE) Fact Sheet for  Patients: BloggerCourse.com  Fact Sheet for Healthcare Providers: SeriousBroker.it  This test is not yet approved or cleared by the [place] FDA and has been authorized for detection and/or diagnosis of SARS-CoV-2 by FDA under an Emergency Use Authorization (EUA). This EUA will remain in effect (meaning this test can be used) for the duration of the COVID-19 declaration under Section 564(b)(1) of the Act, 21 U.S.C. section 360bbb-3(b)(1), unless the authorization is terminated or revoked.  Performed at [facility], [address] [place], [place] [address]   MRSA Next Gen by PCR, Nasal     Status: None   Collection Time: 01/15/24 12:32 PM   Specimen: Nasal Mucosa; Nasal Swab  Result Value Ref Range Status   MRSA by PCR Next Gen NOT DETECTED NOT DETECTED Final    Comment: (NOTE) The GeneXpert MRSA Assay (FDA approved for NASAL specimens only), is one component of a comprehensive MRSA colonization surveillance program. It is not intended to diagnose MRSA infection nor to guide or monitor treatment for MRSA infections. Test performance is not FDA approved in patients less than 49 years old. Performed at [facility], [address] [place], [place] [address]   Resp panel by RT-PCR (RSV, Flu A&B, Covid) Anterior Nasal Swab     Status: None   Collection Time: 01/16/24  9:27 AM   Specimen: Anterior Nasal Swab  Result Value Ref Range Status   SARS Coronavirus 2 by RT PCR NEGATIVE NEGATIVE Final   Influenza A by PCR NEGATIVE NEGATIVE Final   Influenza B by PCR NEGATIVE NEGATIVE Final    Comment: (NOTE) The Xpert Xpress SARS-CoV-2/FLU/RSV plus assay is intended as an aid in the diagnosis of influenza from Nasopharyngeal swab specimens and should not be used as a sole basis for treatment. Nasal washings and aspirates are unacceptable for Xpert Xpress SARS-CoV-2/FLU/RSV testing.  Fact Sheet for  Patients: BloggerCourse.com  Fact Sheet for Healthcare Providers: SeriousBroker.it  This test is not yet approved or cleared by the [place] FDA and has been authorized for detection and/or diagnosis of SARS-CoV-2 by FDA under an Emergency Use Authorization (EUA). This EUA will remain in effect (meaning this test can be used) for the duration of the COVID-19 declaration under Section 564(b)(1) of the Act, 21 U.S.C. section 360bbb-3(b)(1), unless the authorization is terminated or revoked.     Resp Syncytial Virus by PCR NEGATIVE NEGATIVE Final    Comment: (NOTE) Fact Sheet for Patients: BloggerCourse.com  Fact Sheet for Healthcare Providers: SeriousBroker.it  This test  is not yet approved or cleared by the [place] and has been authorized for detection and/or diagnosis of SARS-CoV-2 by FDA under an Emergency Use Authorization (EUA). This EUA will remain in effect (meaning this test can be used) for the duration of the COVID-19 declaration under Section 564(b)(1) of the Act, 21 U.S.C. section 360bbb-3(b)(1), unless the authorization is terminated or revoked.  Performed at [facility], [address] [place], [place] [address]          Radiology Studies: No results found.       Scheduled Meds:  aspirin EC  81 mg Oral QHS   atorvastatin  40 mg Oral QHS   diltiazem  120 mg Oral Daily   insulin aspart  0-9 Units Subcutaneous TID WC   levothyroxine  75 mcg Oral Daily   metoprolol succinate  50 mg Oral Daily   sodium chloride flush  3 mL Intravenous Q12H   sodium chloride flush  3 mL Intravenous Q12H   Continuous Infusions:  heparin 1,400 Units/hr (01/18/24 0816)     LOS: 3 days    Time spent: 35 minutes    Jahmiya Guidotti A Lorriane Dehart, MD [facility]   If 7PM-7AM, please contact night-coverage [email or web address]  01/18/2024, 11:29 AM

## 2024-01-18 NOTE — Progress Notes (Signed)
     [address]       [place] [address]             [phone]       Was told by office that Dr Cliffton Asters performing CABG on Tuesday. Please adjust anticoagulation schedule accordingly

## 2024-01-18 NOTE — Progress Notes (Signed)
 ANTICOAGULATION CONSULT NOTE  Pharmacy Consult for Heparin Indication: chest pain/ACS and atrial fibrillation  No Known Allergies  Patient Measurements: Height: 6' (182.9 cm) Weight: 102.8 kg (226 lb 10.1 oz) IBW/kg (Calculated) : 77.6 HEPARIN DW (KG): 99.1   Vital Signs: Temp: 97.6 F (36.4 C) (02/22 1120) Temp Source: Oral (02/22 1120) BP: 126/71 (02/22 1120) Pulse Rate: 66 (02/22 1120)  Labs: Recent Labs    01/16/24 0923 01/17/24 0205 01/18/24 0204 01/18/24 0723  HGB 16.2  --  12.7*  --   HCT 46.7  --  35.9*  --   PLT 224  --  174  --   CREATININE 1.55* 1.74*  --  1.47*    Estimated Creatinine Clearance: 48.9 mL/min (A) (by C-G formula based on SCr of 1.47 mg/dL (H)).   Medical History: 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) 08/26/2017   GERD (gastroesophageal reflux disease)    Heart disease    Hyperglycemia    Hypertension    Mixed hyperlipidemia    Muscle cramp 08/26/2017   Neuropathy    Osteoarthritis    RBBB    RBBB (right bundle branch block)    Type 2 diabetes mellitus (HCC)     Assessment: 25 yom with a history of CAD s/p DES, HTN, HLD, T2DM, hypothyroidism, RBBB. Patient is presenting with chest pain and AF.  Heparin per pharmacy consult placed for afib/RVR.   He is s/p cath 2/19 with plans for CABG on 2/25. He was started on apixaban 2/20 and has remained inpatient with Afib and AKI.  Apixaban last dose 2/21 PM (given at 2100) and transitioned to heparin this morning (started 0816).   -Initial aPTT 68 sec is therapeutic, heparin level > 1.1 (reflective of recent DOAC use) -No issues with infusion or s/sx of bleeding noted   Goal of Therapy:  Heparin level 0.3-0.7 units/ml Monitor platelets by anticoagulation protocol: Yes   Plan:   -Continue heparin IV 1400 units/hour -Monitor daily aPTT + heparin level, CBC, and signs of bleeding  Trixie Rude, PharmD Clinical Pharmacist 01/18/2024  3:13 PM

## 2024-01-18 NOTE — Progress Notes (Signed)
 Cardiology Progress Note  Patient ID: Darin Mcclure MRN: [id number] DOB: 06-17-1941 Date of Encounter: 01/18/2024 Primary Cardiologist: Donato Schultz, MD  Subjective    HPI: No angina or dyspnea   ROS:  All other ROS reviewed and negative. Pertinent positives noted in the HPI.     Telemetry  Overnight telemetry shows Afib 60s, which I personally reviewed.   Physical Exam   Vitals:   01/17/24 2343 01/18/24 0404 01/18/24 0800 01/18/24 0811  BP: 101/60 119/61 (!) 116/46 (!) 116/46  Pulse: 77 69  73  Resp: 16 13    Temp: 97.7 F (36.5 C) 98.7 F (37.1 C) 98 F (36.7 C)   TempSrc: Oral Oral Oral   SpO2: 94% 95%    Weight:  102.8 kg    Height:        Intake/Output Summary (Last 24 hours) at 01/18/2024 1116 Last data filed at 01/18/2024 4098 Gross per 24 hour  Intake 1026.25 ml  Output 250 ml  Net 776.25 ml       01/18/2024    4:04 AM 01/17/2024    5:00 AM 01/16/2024    6:20 AM  Last 3 Weights  Weight (lbs) 226 lb 10.1 oz 228 lb 2.8 oz 224 lb 13.9 oz  Weight (kg) 102.8 kg 103.5 kg 102 kg    Body mass index is 30.74 kg/m.   Lungs clear No bruit No murmur  Abdomen benign LE varicosities   Cardiac Studies  LHC 01/15/2024    Prox RCA-1 lesion is 80% stenosed.   Prox RCA-2 lesion is 30% stenosed.   Mid RCA lesion is 30% stenosed.   Mid Cx lesion is 90% stenosed.   2nd Mrg lesion is 90% stenosed.   Mid LAD lesion is 80% stenosed.   Dist LAD lesion is 70% stenosed.   2nd Diag lesion is 95% stenosed.   TTE 01/15/2024  1. Left ventricular ejection fraction, by estimation, is 65 to 70%. The  left ventricle has normal function. The left ventricle has no regional  wall motion abnormalities. There is moderate left ventricular hypertrophy.  Left ventricular diastolic  parameters are indeterminate.   2. Right ventricular systolic function is normal. The right ventricular  size is normal. There is normal pulmonary artery systolic pressure. The  estimated right  ventricular systolic pressure is 24.9 mmHg.   3. Left atrial size was moderately dilated.   4. The mitral valve is degenerative. Trivial mitral valve regurgitation.  Mild to moderate mitral stenosis. The mean mitral valve gradient is 5.0  mmHg. Severe mitral annular calcification.   5. The aortic valve is normal in structure. Aortic valve regurgitation is  not visualized. No aortic stenosis is present.   6. The inferior vena cava is normal in size with greater than 50%  respiratory variability, suggesting right atrial pressure of 3 mmHg.    Patient Profile  Darin Mcclure is a 83 y.o. male with CAD status post PCI to the RCA in 2007, hypertension, hyperlipidemia, diabetes, right bundle branch block who was admitted on 01/14/2024 for new onset A-fib shortness of breath and chest pain. Found to have influenza and concerns for congestive heart failure.   Assessment & Plan   # Influenza A # Demand ischemia # Multivessel CAD -Admitted with flu symptoms and found to have multivessel CAD.  Symptoms of flu have resolved.  Also with elevated BNP.  Was given diuresis. -Plan is for CABG on Tuesday with Dr Cliffton Asters now . -Plavix held - CVTS has already changed  him to heparin   # Elevated BNP -Was given a one-time dose of Lasix.  Euvolemic.  No further Lasix.  As needed moving forward. -LVEDP was normal at the time of cath.  # Persistent A-fib -Plan for MAZE LAA clipping if possible at time of surgery - now on heparin - continue oral cardizem and toprol   # AKI -AKI likely related to Lasix, hypotension and contrast load. -Still making urine. -Improved today 1.74-> 1.47   # HTN -Holding losartan and Imdur.  Reducing metoprolol succinate to 50 mg daily.     Charlton Haws MD [facility]

## 2024-01-18 NOTE — Progress Notes (Signed)
 Nephrology Follow-Up Consult note   Assessment/Recommendations: Darin Mcclure is a/an 83 y.o. 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 AKI mild 2/2 hypotension and contrast.  Also recently received losartan.  Recent UA bland -Creatinine now back to baseline -Hold ARB -Continue to monitor daily Cr, Dose meds for GFR -Monitor Daily I/Os, Daily weight  -Maintain MAP>65 for optimal renal perfusion.  -Avoid nephrotoxic medications including NSAIDs -Use synthetic opioids (Fentanyl/Dilaudid) if needed -Currently no indication for HD  We will sign off at this time.   Atrial fibrillation with RVR: Cardiology following.  Heart rates improved   NSTEMI/CAD: LHC on 2/19.  Surgery following.  Will need CABG next week   Influenza: Management per primary team   Type 2 diabetes: Management per primary team   Recommendations conveyed to primary service.    Darin Mcclure [facility] 01/18/2024 9:19 AM  ___________________________________________________________  CC: Chest pain  Interval History/Subjective: Has some mild shortness of breath.  Notes she has pain.  Plan for surgery on Tuesday.   Medications:  Current Facility-Administered Medications  Medication Dose Route Frequency Provider Last Rate Last Admin   acetaminophen (TYLENOL) tablet 650 mg  650 mg Oral Q6H PRN Charlsie Quest, MD       Or   acetaminophen (TYLENOL) suppository 650 mg  650 mg Rectal Q6H PRN Charlsie Quest, MD       aspirin EC tablet 81 mg  81 mg Oral QHS Charlsie Quest, MD   81 mg at 01/17/24 2100   atorvastatin (LIPITOR) tablet 40 mg  40 mg Oral QHS Sande Rives, MD   40 mg at 01/17/24 2100   diltiazem (CARDIZEM CD) 24 hr capsule 120 mg  120 mg Oral Daily Sande Rives, MD   120 mg at 01/18/24 1610   famotidine (PEPCID) tablet 20 mg  20 mg Oral Daily PRN Regalado, Belkys A,  MD       heparin ADULT infusion 100 units/mL (25000 units/268mL)  1,400 Units/hr Intravenous Continuous Silvana Newness, RPH 14 mL/hr at 01/18/24 0816 1,400 Units/hr at 01/18/24 0816   insulin aspart (novoLOG) injection 0-9 Units  0-9 Units Subcutaneous TID WC Charlsie Quest, MD   1 Units at 01/18/24 9604   levothyroxine (SYNTHROID) tablet 75 mcg  75 mcg Oral Daily Charlsie Quest, MD   75 mcg at 01/18/24 5409   metoprolol succinate (TOPROL-XL) 24 hr tablet 50 mg  50 mg Oral Daily Sande Rives, MD   50 mg at 01/18/24 8119   metoprolol tartrate (LOPRESSOR) injection 5 mg  5 mg Intravenous Q4H PRN Charlsie Quest, MD   5 mg at 01/16/24 1120   ondansetron (ZOFRAN) tablet 4 mg  4 mg Oral Q6H PRN Charlsie Quest, MD       Or   ondansetron (ZOFRAN) injection 4 mg  4 mg Intravenous Q6H PRN Charlsie Quest, MD       Oral care mouth rinse  15 mL Mouth Rinse PRN Regalado, Belkys A, MD       senna-docusate (Senokot-S) tablet 1 tablet  1 tablet Oral QHS PRN Darreld Mclean R, MD       sodium chloride flush (NS) 0.9 % injection 3 mL  3 mL Intravenous Q12H Darreld Mclean R, MD   3 mL at 01/18/24 0814   sodium chloride flush (NS) 0.9 %  injection 3 mL  3 mL Intravenous Q12H Kathleene Hazel, MD   3 mL at 01/18/24 0814   sodium chloride flush (NS) 0.9 % injection 3 mL  3 mL Intravenous PRN Kathleene Hazel, MD          Review of Systems: 10 systems reviewed and negative except per interval history/subjective  Physical Exam: Vitals:   01/18/24 0800 01/18/24 0811  BP: (!) 116/46 (!) 116/46  Pulse:  73  Resp:    Temp: 98 F (36.7 C)   SpO2:     Total I/O In: 130 [P.O.:120; I.V.:10] Out: -   Intake/Output Summary (Last 24 hours) at 01/18/2024 0919 Last data filed at 01/18/2024 8295 Gross per 24 hour  Intake 1026.25 ml  Output 750 ml  Net 276.25 ml   Constitutional: well-appearing, no acute distress ENMT: ears and nose without scars or lesions, MMM CV: normal rate, no  edema Respiratory: Bilateral chest rise, normal work of breathing Gastrointestinal: soft, non-tender, no palpable masses or hernias Skin: no visible lesions or rashes Psych: alert, judgement/insight appropriate, appropriate mood and affect   Test Results I personally reviewed new and old clinical labs and radiology tests Lab Results  Component Value Date   NA 139 01/18/2024   K 4.1 01/18/2024   CL 101 01/18/2024   CO2 26 01/18/2024   BUN 23 01/18/2024   CREATININE 1.47 (H) 01/18/2024   GFR 46.28 (L) 02/05/2022   CALCIUM 9.3 01/18/2024   ALBUMIN 3.7 01/18/2024    CBC Recent Labs  Lab 01/15/24 0447 01/16/24 0923 01/18/24 0204  WBC 8.4 10.0 6.6  HGB 13.3 16.2 12.7*  HCT 40.0 46.7 35.9*  MCV 92.0 89.0 88.9  PLT 187 224 174

## 2024-01-18 NOTE — Progress Notes (Signed)
 Mobility Specialist Progress Note;   01/18/24 0950  Mobility  Activity Ambulated with assistance in hallway  Level of Assistance Standby assist, set-up cues, supervision of patient - no hands on  Assistive Device Other (Comment) (IV pole)  Distance Ambulated (ft) 1200 ft  Activity Response Tolerated well  Mobility Referral Yes  Mobility visit 1 Mobility  Mobility Specialist Start Time (ACUTE ONLY) 0950  Mobility Specialist Stop Time (ACUTE ONLY) 1015  Mobility Specialist Time Calculation (min) (ACUTE ONLY) 25 min   Pt eager for mobility. Required no physical assistance during ambulation, SV. VSS throughout and no c/o during session. Pt returned back to chair with all needs met.   Caesar Bookman Mobility Specialist Please contact via SecureChat or [employer] [phone]

## 2024-01-18 NOTE — Progress Notes (Signed)
 ANTICOAGULATION CONSULT NOTE  Pharmacy Consult for Heparin Indication: chest pain/ACS and atrial fibrillation  No Known Allergies  Patient Measurements: Height: 6' (182.9 cm) Weight: 103.5 kg (228 lb 2.8 oz) IBW/kg (Calculated) : 77.6 HEPARIN DW (KG): 99.1   Vital Signs: Temp: 98.7 F (37.1 C) (02/22 0404) Temp Source: Oral (02/22 0404) BP: 119/61 (02/22 0404) Pulse Rate: 69 (02/22 0404)  Labs: Recent Labs    01/16/24 0923 01/17/24 0205 01/18/24 0204  HGB 16.2  --  12.7*  HCT 46.7  --  35.9*  PLT 224  --  174  CREATININE 1.55* 1.74*  --     Estimated Creatinine Clearance: 41.4 mL/min (A) (by C-G formula based on SCr of 1.74 mg/dL (H)).   Medical History: 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) 08/26/2017   GERD (gastroesophageal reflux disease)    Heart disease    Hyperglycemia    Hypertension    Mixed hyperlipidemia    Muscle cramp 08/26/2017   Neuropathy    Osteoarthritis    RBBB    RBBB (right bundle branch block)    Type 2 diabetes mellitus (HCC)     Assessment: 24 yom with a history of CAD s/p DES, HTN, HLD, T2DM, hypothyroidism, RBBB. Patient is presenting with chest pain and AF. Heparin per pharmacy consult placed for afib/RVR.   He is s/p cath 2/19 with plans for CABG on 2/25. He was started on apixaban 2/20 and has remained inpatient with Afib and AKI. Plans to stop apixaban 2/21 PM then start IV heparin this morning.  This morning Hgb down (16.2 >12.7), platelet down (224 >174). No signs of bleeding per RN.  Goal of Therapy:  Heparin level 0.3-0.7 units/ml Monitor platelets by anticoagulation protocol: Yes   Plan:  -apixaban stops after the evening dose on 2/21 -Start UFH IV 1400 units/hour -Monitor 8-hour aPTT and heparin level -Monitor daily  aPTT + heparin level, CBC, and signs of bleeding  [name], PharmD PGY2 Cardiology Pharmacy Resident

## 2024-01-19 DIAGNOSIS — I4891 Unspecified atrial fibrillation: Secondary | ICD-10-CM | POA: Diagnosis not present

## 2024-01-19 DIAGNOSIS — I200 Unstable angina: Secondary | ICD-10-CM

## 2024-01-19 LAB — BASIC METABOLIC PANEL
Anion gap: 12 (ref 5–15)
BUN: 21 mg/dL (ref 8–23)
CO2: 23 mmol/L (ref 22–32)
Calcium: 9.2 mg/dL (ref 8.9–10.3)
Chloride: 103 mmol/L (ref 98–111)
Creatinine, Ser: 1.38 mg/dL — ABNORMAL HIGH (ref 0.61–1.24)
GFR, Estimated: 51 mL/min — ABNORMAL LOW (ref >60)
Glucose, Bld: 152 mg/dL — ABNORMAL HIGH (ref 70–99)
Potassium: 3.7 mmol/L (ref 3.5–5.1)
Sodium: 138 mmol/L (ref 135–145)

## 2024-01-19 LAB — CBC
HCT: 39.2 % (ref 39.0–52.0)
Hemoglobin: 13.4 g/dL (ref 13.0–17.0)
MCH: 30.6 pg (ref 26.0–34.0)
MCHC: 34.2 g/dL (ref 30.0–36.0)
MCV: 89.5 fL (ref 80.0–100.0)
Platelets: 149 10*3/uL — ABNORMAL LOW (ref 150–400)
RBC: 4.38 MIL/uL (ref 4.22–5.81)
RDW: 13 % (ref 11.5–15.5)
WBC: 6.2 10*3/uL (ref 4.0–10.5)
nRBC: 0 % (ref 0.0–0.2)

## 2024-01-19 LAB — APTT: aPTT: 80 s — ABNORMAL HIGH (ref 24–36)

## 2024-01-19 LAB — MAGNESIUM: Magnesium: 1.8 mg/dL (ref 1.7–2.4)

## 2024-01-19 LAB — GLUCOSE, CAPILLARY
Glucose-Capillary: 146 mg/dL — ABNORMAL HIGH (ref 70–99)
Glucose-Capillary: 125 mg/dL — ABNORMAL HIGH (ref 70–99)
Glucose-Capillary: 132 mg/dL — ABNORMAL HIGH (ref 70–99)

## 2024-01-19 LAB — HEPARIN LEVEL (UNFRACTIONATED): Heparin Unfractionated: >1.1 [IU]/mL — ABNORMAL HIGH (ref 0.30–0.70)

## 2024-01-19 MED ORDER — POLYETHYLENE GLYCOL 3350 17 G PO PACK
17.0000 g | PACK | Freq: Every day | ORAL | Status: DC
  Administered 2024-01-19 – 2024-01-20 (×2): 17 g via ORAL
  Filled 2024-01-19 (×2): qty 1

## 2024-01-19 MED ORDER — METOPROLOL SUCCINATE ER 100 MG PO TB24
100.0000 mg | ORAL_TABLET | Freq: Every day | ORAL | Status: DC
  Administered 2024-01-20: 100 mg via ORAL
  Filled 2024-01-19: qty 1

## 2024-01-19 MED ORDER — POTASSIUM CHLORIDE CRYS ER 20 MEQ PO TBCR
40.0000 meq | EXTENDED_RELEASE_TABLET | Freq: Once | ORAL | Status: AC
  Administered 2024-01-19: 40 meq via ORAL
  Filled 2024-01-19: qty 2

## 2024-01-19 MED ORDER — SENNOSIDES-DOCUSATE SODIUM 8.6-50 MG PO TABS
1.0000 | ORAL_TABLET | Freq: Two times a day (BID) | ORAL | Status: DC
  Administered 2024-01-19 – 2024-01-20 (×3): 1 via ORAL
  Filled 2024-01-19 (×3): qty 1

## 2024-01-19 MED ORDER — METOPROLOL TARTRATE 50 MG PO TABS
50.0000 mg | ORAL_TABLET | Freq: Once | ORAL | Status: AC
  Administered 2024-01-19: 50 mg via ORAL
  Filled 2024-01-19: qty 1

## 2024-01-19 MED ORDER — MAGNESIUM SULFATE 2 GM/50ML IV SOLN
2.0000 g | Freq: Once | INTRAVENOUS | Status: AC
  Administered 2024-01-19: 2 g via INTRAVENOUS
  Filled 2024-01-19: qty 50

## 2024-01-19 NOTE — Progress Notes (Addendum)
 Cardiology Progress Note  Patient ID: Darin Mcclure MRN: [id number] DOB: [birth year] Date of Encounter: 01/19/2024 Primary Cardiologist: Darin Schultz, MD  Subjective    [name] ? Some angina when ambulating   ROS:  All other ROS reviewed and negative. Pertinent positives noted in the HPI.     Telemetry  Overnight telemetry shows Afib 60s, which I personally reviewed.   Physical Exam   Vitals:   01/19/24 0500 01/19/24 0632 01/19/24 0746 01/19/24 0923  BP:    (!) 154/72  Pulse:   60 (!) 124  Resp:      Temp:  97.9 F (36.6 C) 97.9 F (36.6 C)   TempSrc:  Oral Oral   SpO2:   96%   Weight: 101.6 kg     Height:        Intake/Output Summary (Last 24 hours) at 01/19/2024 0930 Last data filed at 01/18/2024 1503 Gross per 24 hour  Intake 94.86 ml  Output --  Net 94.86 ml       01/19/2024    5:00 AM 01/18/2024    4:04 AM 01/17/2024    5:00 AM  Last 3 Weights  Weight (lbs) 224 lb 226 lb 10.1 oz 228 lb 2.8 oz  Weight (kg) 101.606 kg 102.8 kg 103.5 kg    Body mass index is 30.38 kg/m.   Lungs clear No bruit No murmur  Abdomen benign LE varicosities   Cardiac Studies  LHC 01/15/2024    Prox RCA-1 lesion is 80% stenosed.   Prox RCA-2 lesion is 30% stenosed.   Mid RCA lesion is 30% stenosed.   Mid Cx lesion is 90% stenosed.   2nd Mrg lesion is 90% stenosed.   Mid LAD lesion is 80% stenosed.   Dist LAD lesion is 70% stenosed.   2nd Diag lesion is 95% stenosed.   TTE 01/15/2024  1. Left ventricular ejection fraction, by estimation, is 65 to 70%. The  left ventricle has normal function. The left ventricle has no regional  wall motion abnormalities. There is moderate left ventricular hypertrophy.  Left ventricular diastolic  parameters are indeterminate.   2. Right ventricular systolic function is normal. The right ventricular  size is normal. There is normal pulmonary artery systolic pressure. The  estimated right ventricular systolic pressure is  24.9 mmHg.   3. Left atrial size was moderately dilated.   4. The mitral valve is degenerative. Trivial mitral valve regurgitation.  Mild to moderate mitral stenosis. The mean mitral valve gradient is 5.0  mmHg. Severe mitral annular calcification.   5. The aortic valve is normal in structure. Aortic valve regurgitation is  not visualized. No aortic stenosis is present.   6. The inferior vena cava is normal in size with greater than 50%  respiratory variability, suggesting right atrial pressure of 3 mmHg.    Patient Profile  Darin Mcclure is a 83 y.o. male with CAD status post PCI to the RCA in 2007, hypertension, hyperlipidemia, diabetes, right bundle branch block who was admitted on 01/14/2024 for new onset A-fib shortness of breath and chest pain. Found to have influenza and concerns for congestive heart failure.   Assessment & Plan   # Influenza A # Demand ischemia # Multivessel CAD -Admitted with flu symptoms and found to have multivessel CAD.  Symptoms of flu have resolved.  Also with elevated BNP.  Was given diuresis. -Plan is for CABG on Tuesday with Dr Darin Mcclure now . -Plavix held - CVTS has already changed him  to heparin   # Elevated BNP -Was given a one-time dose of Lasix.  Euvolemic.  No further Lasix.  As needed moving forward. -LVEDP was normal at the time of cath.  # Persistent A-fib -Plan for MAZE LAA clipping if possible at time of surgery - now on heparin - continue oral cardizem increase toprol dose to 100 mg daily   # AKI -AKI likely related to Lasix, hypotension and contrast load. -Still making urine. -Improved today 1.74-> 1.47 -> 1.38   # HTN -Holding losartan and Imdur.  Reducing metoprolol succinate to 50 mg daily.     Darin Haws MD [facility]

## 2024-01-19 NOTE — Progress Notes (Signed)
 PROGRESS NOTE    Darin Mcclure  BJY:[id number] DOB: December 21, 1940 DOA: 01/14/2024 PCP: Eartha Inch, MD   Brief Narrative: 83 year old with past medical history significant for CAD status post DES to mid RCA 2007, RBBB, diabetes type 2, hypertension, CKD stage IIIa, hypothyroidism, hyperlipidemia presented for evaluation of chest discomfort and shortness of breath.  2 weeks ago he was treated for upper respiratory infection sinusitis for cough.  He completed course of antibiotics and a course of prednisone.  The day prior to admission he developed chest pain on exertion.  Presented for further evaluation  BNP elevated on admission at 666, troponin at 94 x 2.  Influenza A positive.  Chest x-ray negative for consolidation edema or effusion.  Cardiology was consulted for A-fib and RVR and for their evaluation of chest pain.  Underwent Catheterization 2/19,showed three vessels CAD. CVTS have been consulted, plan for surgery 2/25.    Assessment & Plan:   Principal Problem:   New onset atrial fibrillation (HCC) Active Problems:   Coronary artery disease involving native coronary artery of native heart without angina pectoris   Influenza A   Type 2 diabetes mellitus (HCC)   Hypertension associated with diabetes (HCC)   Chronic kidney disease, stage 3a (HCC)   Hypothyroidism   Hyperlipidemia associated with type 2 diabetes mellitus (HCC)   1-New onset of A-fib with RVR -Patient with heart rate in the 150 irregular. -IV as needed metoprolol -Cardiology Following  -ECHO: Action fraction 65 to 70%, no wall motion abnormality.  Right ventricular systolic function is normal.  Left ventricular diastolic parameters are indeterminate. -2/20:HR remain elevated, cardiology have added Cardizem -BP was soft--- Metoprolol change to daily and Cardizem to 120 daily. Eliquis change to IV heparin. HR increasing today, cardiology has increase metoprolol to 100 mg daily  2-Chest pain, STEMI History of  CAD status post DES to RCA 2007 Troponin mildly elevated at 90 Continue with a statin, monitor liver function test Underwent Cath 2/19: showed three vessel CAD> He will Need CABG. Plan for surgery on Tuesday. Lipitor increased to 40 mg.   Imdur will be held due to soft BP Had transient episode of chest pain 2/22. EKG ordered. For Sx on Tuesday.   Influenza A Denies worsening cough, chest x-ray negative for pneumonia. He reports influenza-like symptoms 2 weeks ago, of note 2 weeks ago he was tested negative. Pro-calcitonin  less than 10, will hold on starting antibiotics at this time Repeated test negative. He likely recover from Influenza.   AKI; on CKD stage 3A Insetting of Hypotension and Contrast for cath./ Hold Cozaar.  Nephrology consulted Avoid Hypotension Improved with IV fluids.  Hypertension: Continue metoprolol. Hold losartan due to AKI   Diabetes type 2: Continue to hold metformin and Januvia.  Sliding scale insulin of note he did received dose of metformin last night in the  ED. Will need to hold Metformin for 48 hours after cath.   Hypothyroidism: Continue with Synthroid  HLD: Continue with atorvastatin   Estimated body mass index is 30.38 kg/m as calculated from the following:   Height as of this encounter: 6' (1.829 m).   Weight as of this encounter: 101.6 kg.   DVT prophylaxis: Heparin gtt Code Status: full code Family Communication: Son who was at bedside 2/21 Disposition Plan:  Status is: Observation The patient remains OBS appropriate and will d/c before 2 midnights.    Consultants:  Cardiology   Procedures:  ECHO  Antimicrobials:    Subjective: Feeling ok,  his HR have been increasing today with movement, on standing HR increases to 120 Objective: Vitals:   01/18/24 2109 01/19/24 0019 01/19/24 0500 01/19/24 0632  BP:      Pulse:      Resp:      Temp: 97.7 F (36.5 C) 97.6 F (36.4 C)  97.9 F (36.6 C)  TempSrc: Oral Oral  Oral   SpO2:      Weight:   101.6 kg   Height:        Intake/Output Summary (Last 24 hours) at 01/19/2024 0711 Last data filed at 01/18/2024 1503 Gross per 24 hour  Intake 224.86 ml  Output --  Net 224.86 ml   Filed Weights   01/17/24 0500 01/18/24 0404 01/19/24 0500  Weight: 103.5 kg 102.8 kg 101.6 kg    Examination:  General exam: NAD Respiratory system: CTA Cardiovascular system: S1, S 2 RRR Gastrointestinal system: BS present, soft, nt Central nervous system: Alert. Extremities: No edema  Data Reviewed: I have personally reviewed following labs and imaging studies  CBC: Recent Labs  Lab 01/14/24 1810 01/15/24 0447 01/16/24 0923 01/18/24 0204 01/19/24 0208  WBC 9.9 8.4 10.0 6.6 6.2  HGB 14.2 13.3 16.2 12.7* 13.4  HCT 42.1 40.0 46.7 35.9* 39.2  MCV 91.1 92.0 89.0 88.9 89.5  PLT 202 187 224 174 149*   Basic Metabolic Panel: Recent Labs  Lab 01/15/24 0447 01/16/24 0923 01/17/24 0205 01/18/24 0723 01/19/24 0207  NA 135 139 137 139 138  K 4.4 4.2 4.0 4.1 3.7  CL 102 102 100 101 103  CO2 21* 23 27 26 23   GLUCOSE 154* 181* 156* 178* 152*  BUN 28* 30* 34* 23 21  CREATININE 1.23 1.55* 1.74* 1.47* 1.38*  CALCIUM 9.2 9.5 8.9 9.3 9.2  MG 1.8 1.8  --   --  1.8   GFR: Estimated Creatinine Clearance: 51.8 mL/min (A) (by C-G formula based on SCr of 1.38 mg/dL (H)). Liver Function Tests: Recent Labs  Lab 01/14/24 1817 01/15/24 0447 01/16/24 0923 01/18/24 0723  AST 84* 43* 25 25  ALT 78* 60* 49* 35  ALKPHOS 49 39 44 40  BILITOT 0.8 0.9 1.1 1.2  PROT 7.0 6.2* 6.9 6.9  ALBUMIN 3.7 3.3* 3.7 3.7   No results for input(s): "LIPASE", "AMYLASE" in the last 168 hours. No results for input(s): "AMMONIA" in the last 168 hours. Coagulation Profile: Recent Labs  Lab 01/14/24 2233  INR 1.0   Cardiac Enzymes: No results for input(s): "CKTOTAL", "CKMB", "CKMBINDEX", "TROPONINI" in the last 168 hours. BNP (last 3 results) No results for input(s): "PROBNP" in the last  8760 hours. HbA1C: No results for input(s): "HGBA1C" in the last 72 hours.  CBG: Recent Labs  Lab 01/18/24 0603 01/18/24 1121 01/18/24 1518 01/18/24 2108 01/19/24 0638  GLUCAP 149* 181* 138* 146* 146*   Lipid Profile: Recent Labs    01/16/24 0923  CHOL 117  HDL 35*  LDLCALC 16  TRIG 865*  CHOLHDL 3.3   Thyroid Function Tests: No results for input(s): "TSH", "T4TOTAL", "FREET4", "T3FREE", "THYROIDAB" in the last 72 hours.  Anemia Panel: No results for input(s): "VITAMINB12", "FOLATE", "FERRITIN", "TIBC", "IRON", "RETICCTPCT" in the last 72 hours. Sepsis Labs: Recent Labs  Lab 01/15/24 1012  PROCALCITON <0.10    Recent Results (from the past 240 hours)  Resp panel by RT-PCR (RSV, Flu A&B, Covid) Anterior Nasal Swab     Status: Abnormal   Collection Time: 01/14/24  6:10 PM   Specimen: Anterior  Nasal Swab  Result Value Ref Range Status   SARS Coronavirus 2 by RT PCR NEGATIVE NEGATIVE Final   Influenza A by PCR POSITIVE (A) NEGATIVE Final   Influenza B by PCR NEGATIVE NEGATIVE Final    Comment: (NOTE) The Xpert Xpress SARS-CoV-2/FLU/RSV plus assay is intended as an aid in the diagnosis of influenza from Nasopharyngeal swab specimens and should not be used as a sole basis for treatment. Nasal washings and aspirates are unacceptable for Xpert Xpress SARS-CoV-2/FLU/RSV testing.  Fact Sheet for Patients: BloggerCourse.com  Fact Sheet for Healthcare Providers: SeriousBroker.it  This test is not yet approved or cleared by the [place] FDA and has been authorized for detection and/or diagnosis of SARS-CoV-2 by FDA under an Emergency Use Authorization (EUA). This EUA will remain in effect (meaning this test can be used) for the duration of the COVID-19 declaration under Section 564(b)(1) of the Act, 21 U.S.C. section 360bbb-3(b)(1), unless the authorization is terminated or revoked.     Resp Syncytial Virus by  PCR NEGATIVE NEGATIVE Final    Comment: (NOTE) Fact Sheet for Patients: BloggerCourse.com  Fact Sheet for Healthcare Providers: SeriousBroker.it  This test is not yet approved or cleared by the [place] FDA and has been authorized for detection and/or diagnosis of SARS-CoV-2 by FDA under an Emergency Use Authorization (EUA). This EUA will remain in effect (meaning this test can be used) for the duration of the COVID-19 declaration under Section 564(b)(1) of the Act, 21 U.S.C. section 360bbb-3(b)(1), unless the authorization is terminated or revoked.  Performed at [facility], [address], [place], [place] [address]   MRSA Next Gen by PCR, Nasal     Status: None   Collection Time: 01/15/24 12:32 PM   Specimen: Nasal Mucosa; Nasal Swab  Result Value Ref Range Status   MRSA by PCR Next Gen NOT DETECTED NOT DETECTED Final    Comment: (NOTE) The GeneXpert MRSA Assay (FDA approved for NASAL specimens only), is one component of a comprehensive MRSA colonization surveillance program. It is not intended to diagnose MRSA infection nor to guide or monitor treatment for MRSA infections. Test performance is not FDA approved in patients less than 36 years old. Performed at [facility], [address], [place], [place] [address]   Resp panel by RT-PCR (RSV, Flu A&B, Covid) Anterior Nasal Swab     Status: None   Collection Time: 01/16/24  9:27 AM   Specimen: Anterior Nasal Swab  Result Value Ref Range Status   SARS Coronavirus 2 by RT PCR NEGATIVE NEGATIVE Final   Influenza A by PCR NEGATIVE NEGATIVE Final   Influenza B by PCR NEGATIVE NEGATIVE Final    Comment: (NOTE) The Xpert Xpress SARS-CoV-2/FLU/RSV plus assay is intended as an aid in the diagnosis of influenza from Nasopharyngeal swab specimens and should not be used as a sole basis for treatment. Nasal washings and aspirates are unacceptable for Xpert Xpress  SARS-CoV-2/FLU/RSV testing.  Fact Sheet for Patients: BloggerCourse.com  Fact Sheet for Healthcare Providers: SeriousBroker.it  This test is not yet approved or cleared by the [place] FDA and has been authorized for detection and/or diagnosis of SARS-CoV-2 by FDA under an Emergency Use Authorization (EUA). This EUA will remain in effect (meaning this test can be used) for the duration of the COVID-19 declaration under Section 564(b)(1) of the Act, 21 U.S.C. section 360bbb-3(b)(1), unless the authorization is terminated or revoked.     Resp Syncytial Virus by PCR NEGATIVE NEGATIVE  Final    Comment: (NOTE) Fact Sheet for Patients: BloggerCourse.com  Fact Sheet for Healthcare Providers: SeriousBroker.it  This test is not yet approved or cleared by the [place] FDA and has been authorized for detection and/or diagnosis of SARS-CoV-2 by FDA under an Emergency Use Authorization (EUA). This EUA will remain in effect (meaning this test can be used) for the duration of the COVID-19 declaration under Section 564(b)(1) of the Act, 21 U.S.C. section 360bbb-3(b)(1), unless the authorization is terminated or revoked.  Performed at [facility], [address] [place], [place] [address]          Radiology Studies: No results found.       Scheduled Meds:  aspirin EC  81 mg Oral QHS   atorvastatin  40 mg Oral QHS   diltiazem  120 mg Oral Daily   insulin aspart  0-9 Units Subcutaneous TID WC   levothyroxine  75 mcg Oral Daily   metoprolol succinate  50 mg Oral Daily   sodium chloride flush  3 mL Intravenous Q12H   sodium chloride flush  3 mL Intravenous Q12H   Continuous Infusions:  heparin 1,400 Units/hr (01/19/24 0042)     LOS: 4 days    Time spent: 35 minutes    Quintan Saldivar A Ryoma Nofziger, MD [facility]   If 7PM-7AM, please contact  night-coverage [email or web address]  01/19/2024, 7:11 AM

## 2024-01-19 NOTE — Plan of Care (Signed)
  Problem: Coping: Goal: Ability to adjust to condition or change in health will improve Outcome: Progressing   Problem: Fluid Volume: Goal: Ability to maintain a balanced intake and output will improve Outcome: Progressing   Problem: Health Behavior/Discharge Planning: Goal: Ability to manage health-related needs will improve Outcome: Progressing   Problem: Metabolic: Goal: Ability to maintain appropriate glucose levels will improve Outcome: Progressing   Problem: Tissue Perfusion: Goal: Adequacy of tissue perfusion will improve Outcome: Progressing

## 2024-01-19 NOTE — Progress Notes (Signed)
 ANTICOAGULATION CONSULT NOTE  Pharmacy Consult for Heparin Indication: chest pain/ACS and atrial fibrillation  No Known Allergies  Patient Measurements: Height: 6' (182.9 cm) Weight: 102.8 kg (226 lb 10.1 oz) IBW/kg (Calculated) : 77.6 HEPARIN DW (KG): 99.1   Vital Signs: Temp: 97.6 F (36.4 C) (02/23 0019) Temp Source: Oral (02/23 0019)  Labs: Recent Labs    01/16/24 4696 01/17/24 0205 01/18/24 2952 01/18/24 0723 01/18/24 1552 01/19/24 0207 01/19/24 0208  HGB 16.2  --  12.7*  --   --   --  13.4  HCT 46.7  --  35.9*  --   --   --  39.2  PLT 224  --  174  --   --   --  149*  APTT  --   --   --   --  68* 80*  --   HEPARINUNFRC  --   --   --   --  >1.10* >1.10*  --   CREATININE 1.55* 1.74*  --  1.47*  --  1.38*  --     Estimated Creatinine Clearance: 52.1 mL/min (A) (by C-G formula based on SCr of 1.38 mg/dL (H)).   Medical History: 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) 08/26/2017   GERD (gastroesophageal reflux disease)    Heart disease    Hyperglycemia    Hypertension    Mixed hyperlipidemia    Muscle cramp 08/26/2017   Neuropathy    Osteoarthritis    RBBB    RBBB (right bundle branch block)    Type 2 diabetes mellitus (HCC)     Assessment: 46 yom with a history of CAD s/p DES, HTN, HLD, T2DM, hypothyroidism, RBBB. Patient is presenting with chest pain and AF.  Heparin per pharmacy consult placed for afib/RVR.   He is s/p cath 2/19 with plans for CABG on 2/25. He was started on apixaban 2/20 and has remained inpatient with Afib and AKI.  Apixaban last dose 2/21 PM (given at 2100) and transitioned to heparin this morning (started 0816).   aPTT is therapeutic at 80 seconds on UFH IV 1400 units/hour. CBC is stable. No issues with infusion or s/sx of bleeding  noted   Goal of Therapy:  aPTT 66-102 seconds Heparin level 0.3-0.7 units/ml Monitor platelets by anticoagulation protocol: Yes   Plan:  -Continue heparin IV 1400 units/hour -Monitor daily aPTT + heparin level, CBC, and signs of bleeding  Darin Mcclure, PharmD PGY2 Cardiology Pharmacy Resident 01/19/2024  6:24 AM

## 2024-01-20 DIAGNOSIS — I200 Unstable angina: Secondary | ICD-10-CM | POA: Diagnosis not present

## 2024-01-20 DIAGNOSIS — I4891 Unspecified atrial fibrillation: Secondary | ICD-10-CM | POA: Diagnosis not present

## 2024-01-20 LAB — GLUCOSE, CAPILLARY
Glucose-Capillary: 159 mg/dL — ABNORMAL HIGH (ref 70–99)
Glucose-Capillary: 142 mg/dL — ABNORMAL HIGH (ref 70–99)
Glucose-Capillary: 165 mg/dL — ABNORMAL HIGH (ref 70–99)
Glucose-Capillary: 185 mg/dL — ABNORMAL HIGH (ref 70–99)
Glucose-Capillary: 178 mg/dL — ABNORMAL HIGH (ref 70–99)

## 2024-01-20 LAB — BLOOD GAS, ARTERIAL
Acid-base deficit: 0.6 mmol/L (ref 0.0–2.0)
Bicarbonate: 23.2 mmol/L (ref 20.0–28.0)
Drawn by: 252031
O2 Saturation: 98.7 %
Patient temperature: 36.9
pCO2 arterial: 35 mmHg (ref 32–48)
pH, Arterial: 7.43 (ref 7.35–7.45)
pO2, Arterial: 92 mmHg (ref 83–108)

## 2024-01-20 LAB — CBC
HCT: 40.2 % (ref 39.0–52.0)
Hemoglobin: 13.7 g/dL (ref 13.0–17.0)
MCH: 30.8 pg (ref 26.0–34.0)
MCHC: 34.1 g/dL (ref 30.0–36.0)
MCV: 90.3 fL (ref 80.0–100.0)
Platelets: 144 10*3/uL — ABNORMAL LOW (ref 150–400)
RBC: 4.45 MIL/uL (ref 4.22–5.81)
RDW: 13.2 % (ref 11.5–15.5)
WBC: 6.8 10*3/uL (ref 4.0–10.5)
nRBC: 0 % (ref 0.0–0.2)

## 2024-01-20 LAB — BASIC METABOLIC PANEL
Anion gap: 11 (ref 5–15)
BUN: 21 mg/dL (ref 8–23)
CO2: 22 mmol/L (ref 22–32)
Calcium: 9.3 mg/dL (ref 8.9–10.3)
Chloride: 104 mmol/L (ref 98–111)
Creatinine, Ser: 1.58 mg/dL — ABNORMAL HIGH (ref 0.61–1.24)
GFR, Estimated: 44 mL/min — ABNORMAL LOW (ref >60)
Glucose, Bld: 149 mg/dL — ABNORMAL HIGH (ref 70–99)
Potassium: 4.3 mmol/L (ref 3.5–5.1)
Sodium: 137 mmol/L (ref 135–145)

## 2024-01-20 LAB — PREPARE RBC (CROSSMATCH)

## 2024-01-20 LAB — HEPARIN LEVEL (UNFRACTIONATED): Heparin Unfractionated: 0.79 [IU]/mL — ABNORMAL HIGH (ref 0.30–0.70)

## 2024-01-20 LAB — APTT: aPTT: 78 s — ABNORMAL HIGH (ref 24–36)

## 2024-01-20 LAB — ABO/RH: ABO/RH(D): O NEG

## 2024-01-20 MED ORDER — TRANEXAMIC ACID (OHS) BOLUS VIA INFUSION
15.0000 mg/kg | INTRAVENOUS | Status: AC
  Administered 2024-01-21: 1525.5 mg via INTRAVENOUS
  Filled 2024-01-20: qty 1526

## 2024-01-20 MED ORDER — CEFAZOLIN SODIUM-DEXTROSE 2-4 GM/100ML-% IV SOLN
2.0000 g | INTRAVENOUS | Status: DC
  Filled 2024-01-20: qty 100

## 2024-01-20 MED ORDER — TRANEXAMIC ACID 1000 MG/10ML IV SOLN
1.5000 mg/kg/h | INTRAVENOUS | Status: AC
  Administered 2024-01-21: 1.5 mg/kg/h via INTRAVENOUS
  Filled 2024-01-20 (×2): qty 25

## 2024-01-20 MED ORDER — INSULIN REGULAR(HUMAN) IN NACL 100-0.9 UT/100ML-% IV SOLN
INTRAVENOUS | Status: AC
  Administered 2024-01-21: 3 [IU]/h via INTRAVENOUS
  Filled 2024-01-20: qty 100

## 2024-01-20 MED ORDER — NOREPINEPHRINE 4 MG/250ML-% IV SOLN
0.0000 ug/min | INTRAVENOUS | Status: DC
  Filled 2024-01-20: qty 250

## 2024-01-20 MED ORDER — PLASMA-LYTE A IV SOLN
INTRAVENOUS | Status: DC
  Filled 2024-01-20: qty 2.5

## 2024-01-20 MED ORDER — HEPARIN 30,000 UNITS/1000 ML (OHS) CELLSAVER SOLUTION
Status: DC
  Filled 2024-01-20: qty 1000

## 2024-01-20 MED ORDER — TEMAZEPAM 15 MG PO CAPS
15.0000 mg | ORAL_CAPSULE | Freq: Once | ORAL | Status: AC | PRN
  Administered 2024-01-20: 15 mg via ORAL
  Filled 2024-01-20: qty 1

## 2024-01-20 MED ORDER — MILRINONE LACTATE IN DEXTROSE 20-5 MG/100ML-% IV SOLN
0.3000 ug/kg/min | INTRAVENOUS | Status: DC
  Filled 2024-01-20: qty 100

## 2024-01-20 MED ORDER — BISACODYL 5 MG PO TBEC
5.0000 mg | DELAYED_RELEASE_TABLET | Freq: Once | ORAL | Status: AC
  Administered 2024-01-20: 5 mg via ORAL
  Filled 2024-01-20: qty 1

## 2024-01-20 MED ORDER — CHLORHEXIDINE GLUCONATE 0.12 % MT SOLN
15.0000 mL | Freq: Once | OROMUCOSAL | Status: AC
  Administered 2024-01-21: 15 mL via OROMUCOSAL
  Filled 2024-01-20: qty 15

## 2024-01-20 MED ORDER — CHLORHEXIDINE GLUCONATE CLOTH 2 % EX PADS
6.0000 | MEDICATED_PAD | Freq: Once | CUTANEOUS | Status: AC
  Administered 2024-01-21: 6 via TOPICAL

## 2024-01-20 MED ORDER — CHLORHEXIDINE GLUCONATE CLOTH 2 % EX PADS
6.0000 | MEDICATED_PAD | Freq: Once | CUTANEOUS | Status: AC
  Administered 2024-01-20: 6 via TOPICAL

## 2024-01-20 MED ORDER — NITROGLYCERIN IN D5W 200-5 MCG/ML-% IV SOLN
2.0000 ug/min | INTRAVENOUS | Status: DC
  Filled 2024-01-20: qty 250

## 2024-01-20 MED ORDER — DEXMEDETOMIDINE HCL IN NACL 400 MCG/100ML IV SOLN
0.1000 ug/kg/h | INTRAVENOUS | Status: AC
  Administered 2024-01-21: .5 ug/kg/h via INTRAVENOUS
  Filled 2024-01-20: qty 100

## 2024-01-20 MED ORDER — CEFAZOLIN SODIUM-DEXTROSE 2-4 GM/100ML-% IV SOLN
2.0000 g | INTRAVENOUS | Status: AC
  Administered 2024-01-21 (×2): 2 g via INTRAVENOUS
  Filled 2024-01-20: qty 100

## 2024-01-20 MED ORDER — POTASSIUM CHLORIDE 2 MEQ/ML IV SOLN
80.0000 meq | INTRAVENOUS | Status: DC
  Filled 2024-01-20: qty 40

## 2024-01-20 MED ORDER — TRANEXAMIC ACID (OHS) PUMP PRIME SOLUTION
2.0000 mg/kg | INTRAVENOUS | Status: DC
  Filled 2024-01-20: qty 2.03

## 2024-01-20 MED ORDER — LACTATED RINGERS IV SOLN: INTRAVENOUS | Status: DC

## 2024-01-20 MED ORDER — PHENYLEPHRINE HCL-NACL 20-0.9 MG/250ML-% IV SOLN
30.0000 ug/min | INTRAVENOUS | Status: AC
  Administered 2024-01-21: 30 ug/min via INTRAVENOUS
  Filled 2024-01-20: qty 250

## 2024-01-20 MED ORDER — METOPROLOL TARTRATE 12.5 MG HALF TABLET
12.5000 mg | ORAL_TABLET | Freq: Once | ORAL | Status: AC
  Administered 2024-01-21: 12.5 mg via ORAL
  Filled 2024-01-20: qty 1

## 2024-01-20 MED ORDER — VANCOMYCIN HCL 1.5 G IV SOLR
1500.0000 mg | INTRAVENOUS | Status: AC
  Administered 2024-01-21: 1500 mg via INTRAVENOUS
  Filled 2024-01-20: qty 30

## 2024-01-20 MED ORDER — EPINEPHRINE HCL 5 MG/250ML IV SOLN IN NS
0.0000 ug/min | INTRAVENOUS | Status: DC
  Filled 2024-01-20: qty 250

## 2024-01-20 MED ORDER — MANNITOL 20 % IV SOLN
INTRAVENOUS | Status: DC
  Filled 2024-01-20: qty 13

## 2024-01-20 NOTE — Progress Notes (Signed)
 CARDIAC REHAB PHASE I      Pre-op OHS education including OHS booklet, OHS handout, IS use, mobility importance, home needs at discharge and sternal precautions/move in the tube reviewed. All questions and concerns addressed. Will continue to follow.   Woodroe Chen, RN BSN 01/20/2024 11:49 AM

## 2024-01-20 NOTE — Progress Notes (Signed)
 Progress Note  Patient Name: Darin Mcclure Date of Encounter: 01/20/2024  Primary Cardiologist:   Donato Schultz, MD   Subjective   The patient denies any palpitations or shortness of breath.  He has had no chest discomfort.  Inpatient Medications    Scheduled Meds:  aspirin EC  81 mg Oral QHS   atorvastatin  40 mg Oral QHS   diltiazem  120 mg Oral Daily   insulin aspart  0-9 Units Subcutaneous TID WC   levothyroxine  75 mcg Oral Daily   metoprolol succinate  100 mg Oral Daily   polyethylene glycol  17 g Oral Daily   senna-docusate  1 tablet Oral BID   sodium chloride flush  3 mL Intravenous Q12H   sodium chloride flush  3 mL Intravenous Q12H   Continuous Infusions:  heparin 1,400 Units/hr (01/20/24 0559)   PRN Meds: acetaminophen **OR** acetaminophen, famotidine, metoprolol tartrate, ondansetron **OR** ondansetron (ZOFRAN) IV, mouth rinse, sodium chloride flush   Vital Signs    Vitals:   01/19/24 2039 01/20/24 0015 01/20/24 0512 01/20/24 0624  BP: 123/69 111/70 120/78   Pulse: 69  73   Resp: 12 17 14    Temp: 97.7 F (36.5 C) 97.9 F (36.6 C) 98.4 F (36.9 C)   TempSrc: Oral Oral Oral   SpO2: 94% 99% 93%   Weight:    101.7 kg  Height:        Intake/Output Summary (Last 24 hours) at 01/20/2024 0749 Last data filed at 01/20/2024 0559 Gross per 24 hour  Intake 544.85 ml  Output --  Net 544.85 ml   Filed Weights   01/18/24 0404 01/19/24 0500 01/20/24 0624  Weight: 102.8 kg 101.6 kg 101.7 kg    Telemetry    Atrial fibrillation with controlled ventricular rate- Personally Reviewed  ECG    NA - Personally Reviewed  Physical Exam   GEN: No acute distress.   Neck: No  JVD Cardiac: Irregular RR, no murmurs, rubs, or gallops.  Respiratory: Clear  to auscultation bilaterally. GI: Soft, nontender, non-distended  MS: No  edema; No deformity. Neuro:  Nonfocal  Psych: Normal affect   Labs    Chemistry Recent Labs  Lab 01/15/24 0447 01/16/24 0923  01/17/24 0205 01/18/24 0723 01/19/24 0207  NA 135 139 137 139 138  K 4.4 4.2 4.0 4.1 3.7  CL 102 102 100 101 103  CO2 21* 23 27 26 23   GLUCOSE 154* 181* 156* 178* 152*  BUN 28* 30* 34* 23 21  CREATININE 1.23 1.55* 1.74* 1.47* 1.38*  CALCIUM 9.2 9.5 8.9 9.3 9.2  PROT 6.2* 6.9  --  6.9  --   ALBUMIN 3.3* 3.7  --  3.7  --   AST 43* 25  --  25  --   ALT 60* 49*  --  35  --   ALKPHOS 39 44  --  40  --   BILITOT 0.9 1.1  --  1.2  --   GFRNONAA 59* 45* 39* 48* 51*  ANIONGAP 12 14 10 12 12      Hematology Recent Labs  Lab 01/18/24 0204 01/19/24 0208 01/20/24 0207  WBC 6.6 6.2 6.8  RBC 4.04* 4.38 4.45  HGB 12.7* 13.4 13.7  HCT 35.9* 39.2 40.2  MCV 88.9 89.5 90.3  MCH 31.4 30.6 30.8  MCHC 35.4 34.2 34.1  RDW 13.0 13.0 13.2  PLT 174 149* 144*    Cardiac EnzymesNo results for input(s): "TROPONINI" in the last 168 hours.  No results for input(s): "TROPIPOC" in the last 168 hours.   BNP Recent Labs  Lab 01/14/24 1810  BNP 666.1*     DDimer No results for input(s): "DDIMER" in the last 168 hours.   Radiology    No results found.  Cardiac Studies   Diagnostic Dominance: Right    Patient Profile     83 y.o. male with CAD status post PCI to the RCA in 2007, hypertension, hyperlipidemia, diabetes, right bundle branch block who was admitted on 01/14/2024 for new onset A-fib shortness of breath and chest pain. Found to have influenza and concerns for congestive heart failure.   Assessment & Plan    Influenza A:  Managed per primary team.    Demand ischemia:   Plan for CABG on Tuesday.     Elevated BNP:   NL LVEDP normal.  Treated with Lasix as clinically indicated.   Now with mild RI.     Persistent A-fib:   Rate is relatively controlled.  Continue heparin for now.  Will need DOAC at discharge.  AKI:   Continue to trend creat.   HTN:  BP is improved.  Holding ARB and Imdur.   For questions or updates, please contact [facility] Please consult [email or web address]  for contact info under Cardiology/STEMI.   Signed, Rollene Rotunda, MD  01/20/2024, 7:49 AM

## 2024-01-20 NOTE — Progress Notes (Signed)
 ANTICOAGULATION CONSULT NOTE  Pharmacy Consult for Heparin Indication: chest pain/ACS and atrial fibrillation  No Known Allergies  Patient Measurements: Height: 6' (182.9 cm) Weight: 101.7 kg (224 lb 3.3 oz) IBW/kg (Calculated) : 77.6 HEPARIN DW (KG): 99.1   Vital Signs: Temp: 98.1 F (36.7 C) (02/24 0751) Temp Source: Oral (02/24 0751) BP: 107/78 (02/24 0751) Pulse Rate: 80 (02/24 0751)  Labs: Recent Labs    01/18/24 0204 01/18/24 0723 01/18/24 1552 01/19/24 0207 01/19/24 0208 01/20/24 0207  HGB 12.7*  --   --   --  13.4 13.7  HCT 35.9*  --   --   --  39.2 40.2  PLT 174  --   --   --  149* 144*  APTT  --   --  68* 80*  --  78*  HEPARINUNFRC  --   --  >1.10* >1.10*  --  0.79*  CREATININE  --  1.47*  --  1.38*  --   --     Estimated Creatinine Clearance: 51.8 mL/min (A) (by C-G formula based on SCr of 1.38 mg/dL (H)).   Medical History: 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) 08/26/2017   GERD (gastroesophageal reflux disease)    Heart disease    Hyperglycemia    Hypertension    Mixed hyperlipidemia    Muscle cramp 08/26/2017   Neuropathy    Osteoarthritis    RBBB    RBBB (right bundle branch block)    Type 2 diabetes mellitus (HCC)     Assessment: 27 yom with a history of CAD s/p DES, HTN, HLD, T2DM, hypothyroidism, RBBB. Patient is presenting with chest pain and AF.  Heparin per pharmacy consult placed for afib/RVR.   He is s/p cath 2/19 with plans for CABG on 2/25. He was started on apixaban 2/20 and has remained inpatient with Afib and AKI.  Apixaban last dose 2/21 PM (given at 2100) and transitioned to heparin this morning (started 0816).   aPTT is therapeutic at 78 seconds on UFH IV 1400 units/hour. CBC is stable. No issues with infusion or s/sx of  bleeding noted   Goal of Therapy:  aPTT 66-102 seconds Heparin level 0.3-0.7 units/ml Monitor platelets by anticoagulation protocol: Yes   Plan:  -Continue heparin IV 1400 units/hour -Monitor daily aPTT + heparin level, CBC, and signs of bleeding  CABG planned 2/25  Thank you Okey Regal, PharmD  01/20/2024  8:07 AM

## 2024-01-20 NOTE — Progress Notes (Signed)
 PROGRESS NOTE    Darin Mcclure  ZOX:[id number] DOB: [birth year] DOA: 01/14/2024 PCP: Eartha Inch, MD   Brief Narrative: 83 year old with past medical history significant for CAD status post DES to mid RCA 2007, RBBB, diabetes type 2, hypertension, CKD stage IIIa, hypothyroidism, hyperlipidemia presented for evaluation of chest discomfort and shortness of breath.  2 weeks ago he was treated for upper respiratory infection sinusitis for cough.  He completed course of antibiotics and a course of prednisone.  The day prior to admission he developed chest pain on exertion.  Presented for further evaluation  BNP elevated on admission at 666, troponin at 94 x 2.  Influenza A positive.  Chest x-ray negative for consolidation edema or effusion.  Cardiology was consulted for A-fib and RVR and for their evaluation of chest pain.  Underwent Catheterization 2/19,showed three vessels CAD. CVTS have been consulted, plan for surgery 2/25.    Assessment & Plan:   Principal Problem:   New onset atrial fibrillation (HCC) Active Problems:   Coronary artery disease involving native coronary artery of native heart without angina pectoris   Influenza A   Type 2 diabetes mellitus (HCC)   Hypertension associated with diabetes (HCC)   Chronic kidney disease, stage 3a (HCC)   Hypothyroidism   Hyperlipidemia associated with type 2 diabetes mellitus (HCC)   1-New onset of A-fib with RVR -Patient presented with HR 150 irregular. -IV as needed metoprolol -Cardiology Following  -ECHO: Action fraction 65 to 70%, no wall motion abnormality.  Right ventricular systolic function is normal.  Left ventricular diastolic parameters are indeterminate. -Eliquis change to IV heparin. -Continue with Cardizem and metoprolol 100 mg daily (increased 2/23.)  2-Chest pain, STEMI History of CAD status post DES to RCA 2007 Troponin mildly elevated at 90 Continue with a statin, monitor liver function test Underwent Cath  2/19: showed three vessel CAD> He will Need CABG. Plan for surgery on Tuesday. Lipitor increased to 40 mg.   Imdur will be held due to soft BP Had transient episode of chest pain 2/22. EKG ordered.   Influenza A Denies worsening cough, chest x-ray negative for pneumonia. He reports influenza-like symptoms 2 weeks ago, of note 2 weeks ago he was tested negative. Pro-calcitonin  less than 10, will hold on starting antibiotics at this time Repeated test negative. He likely recover from Influenza.  Discontinue droplet.  AKI; on CKD stage 3A Insetting of Hypotension and Contrast for cath./ Hold Cozaar.  Nephrology consulted Avoid Hypotension Improved with IV fluids. Mildly increased 1.5. avoid hypotension. IV Fluids.  Hypertension: Continue metoprolol. Hold losartan due to AKI   Diabetes type 2: Continue to hold metformin and Januvia.  Sliding scale insulin of note he did received dose of metformin last night in the  ED. Will need to hold Metformin for 48 hours after cath.   Hypothyroidism: Continue with Synthroid  HLD: Continue with atorvastatin   Estimated body mass index is 30.41 kg/m as calculated from the following:   Height as of this encounter: 6' (1.829 m).   Weight as of this encounter: 101.7 kg.   DVT prophylaxis: Heparin gtt Code Status: full code Family Communication: Son who was at bedside 2/21 Disposition Plan:  Status is: Observation The patient remains OBS appropriate and will d/c before 2 midnights.    Consultants:  Cardiology   Procedures:  ECHO  Antimicrobials:    Subjective: No more chest pain. Had BM. HR elevated again this am.  Objective: Vitals:   01/20/24 0624 01/20/24  4098 01/20/24 0840 01/20/24 1113  BP:  107/78 107/78 111/81  Pulse:  80 (!) 144 74  Resp:  19  14  Temp:  98.1 F (36.7 C)  98.2 F (36.8 C)  TempSrc:  Oral  Oral  SpO2:  96%  96%  Weight: 101.7 kg     Height:        Intake/Output Summary (Last 24 hours) at  01/20/2024 1252 Last data filed at 01/20/2024 1191 Gross per 24 hour  Intake 664.85 ml  Output --  Net 664.85 ml   Filed Weights   01/18/24 0404 01/19/24 0500 01/20/24 0624  Weight: 102.8 kg 101.6 kg 101.7 kg    Examination:  General exam: NAD Respiratory system: CTA Cardiovascular system: S1, S 2 IRR Gastrointestinal system: BS present, soft, Central nervous system: Alert. Extremities: No edema  Data Reviewed: I have personally reviewed following labs and imaging studies  CBC: Recent Labs  Lab 01/15/24 0447 01/16/24 0923 01/18/24 0204 01/19/24 0208 01/20/24 0207  WBC 8.4 10.0 6.6 6.2 6.8  HGB 13.3 16.2 12.7* 13.4 13.7  HCT 40.0 46.7 35.9* 39.2 40.2  MCV 92.0 89.0 88.9 89.5 90.3  PLT 187 224 174 149* 144*   Basic Metabolic Panel: Recent Labs  Lab 01/15/24 0447 01/16/24 0923 01/17/24 0205 01/18/24 0723 01/19/24 0207 01/20/24 0207  NA 135 139 137 139 138 137  K 4.4 4.2 4.0 4.1 3.7 4.3  CL 102 102 100 101 103 104  CO2 21* 23 27 26 23 22   GLUCOSE 154* 181* 156* 178* 152* 149*  BUN 28* 30* 34* 23 21 21   CREATININE 1.23 1.55* 1.74* 1.47* 1.38* 1.58*  CALCIUM 9.2 9.5 8.9 9.3 9.2 9.3  MG 1.8 1.8  --   --  1.8  --    GFR: Estimated Creatinine Clearance: 45.2 mL/min (A) (by C-G formula based on SCr of 1.58 mg/dL (H)). Liver Function Tests: Recent Labs  Lab 01/14/24 1817 01/15/24 0447 01/16/24 0923 01/18/24 0723  AST 84* 43* 25 25  ALT 78* 60* 49* 35  ALKPHOS 49 39 44 40  BILITOT 0.8 0.9 1.1 1.2  PROT 7.0 6.2* 6.9 6.9  ALBUMIN 3.7 3.3* 3.7 3.7   No results for input(s): "LIPASE", "AMYLASE" in the last 168 hours. No results for input(s): "AMMONIA" in the last 168 hours. Coagulation Profile: Recent Labs  Lab 01/14/24 2233  INR 1.0   Cardiac Enzymes: No results for input(s): "CKTOTAL", "CKMB", "CKMBINDEX", "TROPONINI" in the last 168 hours. BNP (last 3 results) No results for input(s): "PROBNP" in the last 8760 hours. HbA1C: No results for  input(s): "HGBA1C" in the last 72 hours.  CBG: Recent Labs  Lab 01/19/24 1114 01/19/24 1545 01/19/24 2139 01/20/24 0622 01/20/24 1115  GLUCAP 159* 125* 132* 142* 165*   Lipid Profile: No results for input(s): "CHOL", "HDL", "LDLCALC", "TRIG", "CHOLHDL", "LDLDIRECT" in the last 72 hours.  Thyroid Function Tests: No results for input(s): "TSH", "T4TOTAL", "FREET4", "T3FREE", "THYROIDAB" in the last 72 hours.  Anemia Panel: No results for input(s): "VITAMINB12", "FOLATE", "FERRITIN", "TIBC", "IRON", "RETICCTPCT" in the last 72 hours. Sepsis Labs: Recent Labs  Lab 01/15/24 1012  PROCALCITON <0.10    Recent Results (from the past 240 hours)  Resp panel by RT-PCR (RSV, Flu A&B, Covid) Anterior Nasal Swab     Status: Abnormal   Collection Time: 01/14/24  6:10 PM   Specimen: Anterior Nasal Swab  Result Value Ref Range Status   SARS Coronavirus 2 by RT PCR NEGATIVE NEGATIVE Final  Influenza A by PCR POSITIVE (A) NEGATIVE Final   Influenza B by PCR NEGATIVE NEGATIVE Final    Comment: (NOTE) The Xpert Xpress SARS-CoV-2/FLU/RSV plus assay is intended as an aid in the diagnosis of influenza from Nasopharyngeal swab specimens and should not be used as a sole basis for treatment. Nasal washings and aspirates are unacceptable for Xpert Xpress SARS-CoV-2/FLU/RSV testing.  Fact Sheet for Patients: BloggerCourse.com  Fact Sheet for Healthcare Providers: SeriousBroker.it  This test is not yet approved or cleared by the [place] FDA and has been authorized for detection and/or diagnosis of SARS-CoV-2 by FDA under an Emergency Use Authorization (EUA). This EUA will remain in effect (meaning this test can be used) for the duration of the COVID-19 declaration under Section 564(b)(1) of the Act, 21 U.S.C. section 360bbb-3(b)(1), unless the authorization is terminated or revoked.     Resp Syncytial Virus by PCR NEGATIVE NEGATIVE  Final    Comment: (NOTE) Fact Sheet for Patients: BloggerCourse.com  Fact Sheet for Healthcare Providers: SeriousBroker.it  This test is not yet approved or cleared by the [place] FDA and has been authorized for detection and/or diagnosis of SARS-CoV-2 by FDA under an Emergency Use Authorization (EUA). This EUA will remain in effect (meaning this test can be used) for the duration of the COVID-19 declaration under Section 564(b)(1) of the Act, 21 U.S.C. section 360bbb-3(b)(1), unless the authorization is terminated or revoked.  Performed at [facility], [address], [place], [place] [address]   MRSA Next Gen by PCR, Nasal     Status: None   Collection Time: 01/15/24 12:32 PM   Specimen: Nasal Mucosa; Nasal Swab  Result Value Ref Range Status   MRSA by PCR Next Gen NOT DETECTED NOT DETECTED Final    Comment: (NOTE) The GeneXpert MRSA Assay (FDA approved for NASAL specimens only), is one component of a comprehensive MRSA colonization surveillance program. It is not intended to diagnose MRSA infection nor to guide or monitor treatment for MRSA infections. Test performance is not FDA approved in patients less than 46 years old. Performed at [facility], [address] [place], [place] [address]   Resp panel by RT-PCR (RSV, Flu A&B, Covid) Anterior Nasal Swab     Status: None   Collection Time: 01/16/24  9:27 AM   Specimen: Anterior Nasal Swab  Result Value Ref Range Status   SARS Coronavirus 2 by RT PCR NEGATIVE NEGATIVE Final   Influenza A by PCR NEGATIVE NEGATIVE Final   Influenza B by PCR NEGATIVE NEGATIVE Final    Comment: (NOTE) The Xpert Xpress SARS-CoV-2/FLU/RSV plus assay is intended as an aid in the diagnosis of influenza from Nasopharyngeal swab specimens and should not be used as a sole basis for treatment. Nasal washings and aspirates are unacceptable for Xpert Xpress  SARS-CoV-2/FLU/RSV testing.  Fact Sheet for Patients: BloggerCourse.com  Fact Sheet for Healthcare Providers: SeriousBroker.it  This test is not yet approved or cleared by the [place] FDA and has been authorized for detection and/or diagnosis of SARS-CoV-2 by FDA under an Emergency Use Authorization (EUA). This EUA will remain in effect (meaning this test can be used) for the duration of the COVID-19 declaration under Section 564(b)(1) of the Act, 21 U.S.C. section 360bbb-3(b)(1), unless the authorization is terminated or revoked.     Resp Syncytial Virus by PCR NEGATIVE NEGATIVE Final    Comment: (NOTE) Fact Sheet for Patients: BloggerCourse.com  Fact Sheet for Healthcare Providers: SeriousBroker.it  This test  is not yet approved or cleared by the [place] and has been authorized for detection and/or diagnosis of SARS-CoV-2 by FDA under an Emergency Use Authorization (EUA). This EUA will remain in effect (meaning this test can be used) for the duration of the COVID-19 declaration under Section 564(b)(1) of the Act, 21 U.S.C. section 360bbb-3(b)(1), unless the authorization is terminated or revoked.  Performed at [facility], [address], [place], [place] [address]          Radiology Studies: No results found.       Scheduled Meds:  aspirin EC  81 mg Oral QHS   atorvastatin  40 mg Oral QHS   diltiazem  120 mg Oral Daily   insulin aspart  0-9 Units Subcutaneous TID WC   levothyroxine  75 mcg Oral Daily   metoprolol succinate  100 mg Oral Daily   polyethylene glycol  17 g Oral Daily   senna-docusate  1 tablet Oral BID   sodium chloride flush  3 mL Intravenous Q12H   sodium chloride flush  3 mL Intravenous Q12H   Continuous Infusions:  heparin 1,400 Units/hr (01/20/24 0559)     LOS: 5 days    Time spent: 35 minutes    Melannie Metzner  A Tashanti Dalporto, MD [facility]   If 7PM-7AM, please contact night-coverage [email or web address]  01/20/2024, 12:52 PM

## 2024-01-20 NOTE — Care Management Important Message (Signed)
 Important Message  Patient Details  Name: Darin Mcclure MRN: [id number] Date of Birth: 1941-05-12   Important Message Given:  Yes - Medicare IM     Dorena Bodo 01/20/2024, 3:09 PM

## 2024-01-20 NOTE — Hospital Course (Addendum)
 HPI: This is an 83 year old male we are asked to see in cardiothoracic surgical consultation for consideration of coronary artery surgical revascularization. He has a known history of CAD status post drug-eluting stent to the RCA in 2007. Other cardiac risk factors include hypertension, hyperlipidemia and type 2 diabetes. He also has a known right bundle branch block. He presented to the emergency room on 218 with atrial fibrillation. He has recently had symptoms of a URI and did undergo a course of antibiotics as well as steroids which she completed on 01/13/2024. The day prior to presenting he had an episode of chest pain and shortness of breath while climbing the steps. The pain was mid sternal and it did not radiate and improved with rest. On the date of presentation to the ED the symptoms became more persistent and he notes that he was feeling increasingly fatigued. He was found to be in atrial fibrillation with intermittent episodes of RVR with heart rate up to the 150s. Cardiology consultation was obtained and he was admitted for further medical stabilization and diagnostic evaluation. He had 2 sets of troponin I's obtained both values were 94. BNP was elevated at 666.1. He was started on IV heparin. He has been maintained at home on Plavix and aspirin for his previous stent. He has subsequently undergone cardiac catheterization and echocardiogram and the full results are as described below. He has severe three-vessel disease with good LV function and good RV function. 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 A cardiothoracic consultation was obtained for the consideration of coronary artery bypass grafting surgery. Potential risks, benefits, and complications of the surgery were discussed with the patient and he agreed to proceed with surgery. Pre operative carotid duplex US showed no significant internal carotid artery stenosis bilaterally.   Hospital Course: Patient underwent a CABG x 4,  MAZE, LA clip. Patient was transported from the OR to [facility] ICU in stable condition. He was extubated the evening of surgery. He was on Amiodarone and Neo Synephrine drips. A line, chest tubes, and foley were removed early in his post operative course. He was initially paced at 70 (underlying bradycardia). He had dizziness initially, but this did improve. He was transitioned off the Insulin drip. His pre op HGA1C was 7.1. He will be restarted on Januvia and Metformin closer to discharge. He had expected post op blood loss anemia and did not require a post op transfusion. Last H and H was 8.0/23.9.  He also had mild thrombocytopenia. His last platelet count was 91K. His creatinine increased to 1.81 post op. Prior to admission, his creatinine was 1.48. His creatinine did gradually decrease and was down to 1.59 on 03/01. Epicardial pacing wires were removed on 02/27. He was restarted on Apixaban for history of atrial fibrillation. He was stable for transfer from the ICU to 4E for further convalescence on 02/28.  The patient has done well.  He remains in NSR.  He is ambulating without difficulty.  His surgical incisions are healing without evidence of infection.  He is medically stable for discharge home today.

## 2024-01-20 NOTE — Plan of Care (Signed)
  Problem: Coping: Goal: Ability to adjust to condition or change in health will improve Outcome: Progressing   Problem: Tissue Perfusion: Goal: Adequacy of tissue perfusion will improve Outcome: Progressing   Problem: Activity: Goal: Ability to tolerate increased activity will improve Outcome: Progressing   Problem: Activity: Goal: Risk for activity intolerance will decrease Outcome: Progressing   Problem: Nutrition: Goal: Adequate nutrition will be maintained Outcome: Progressing   Problem: Coping: Goal: Level of anxiety will decrease Outcome: Progressing   Problem: Elimination: Goal: Will not experience complications related to bowel motility Outcome: Progressing Goal: Will not experience complications related to urinary retention Outcome: Progressing   Problem: Pain Managment: Goal: General experience of comfort will improve and/or be controlled Outcome: Progressing   Problem: Safety: Goal: Ability to remain free from injury will improve Outcome: Progressing   Problem: Skin Integrity: Goal: Risk for impaired skin integrity will decrease Outcome: Progressing

## 2024-01-20 NOTE — TOC Progression Note (Signed)
 Transition of Care [facility]) - Progression Note    Patient Details  Name: Rayshad Riviello MRN: [id number] Date of Birth: [birth year]  [facility] CM/SW Contact  Harriet Masson, RN Phone Number: 01/20/2024, 2:00 PM  Clinical Narrative:    CABG scheduled for 2//25/25. TOC following.         Expected Discharge Plan and Services                                               Social Determinants of Health (SDOH) Interventions SDOH Screenings   Food Insecurity: No Food Insecurity (01/15/2024)  Housing: Low Risk  (01/15/2024)  Transportation Needs: No Transportation Needs (01/15/2024)  Utilities: Not At Risk (01/15/2024)  Financial Resource Strain: Low Risk  (12/30/2023)   Received from [facility]  Physical Activity: Insufficiently Active (07/31/2023)   Received from [facility]  Social Connections: Socially Integrated (01/15/2024)  Stress: No Stress Concern Present (07/31/2023)   Received from [facility]  Tobacco Use: Low Risk  (01/14/2024)    Readmission Risk Interventions     No data to display

## 2024-01-20 NOTE — Anesthesia Preprocedure Evaluation (Signed)
 Anesthesia Evaluation  Patient identified by MRN, date of birth, ID band Patient awake    Reviewed: Allergy & Precautions, NPO status , Patient's Chart, lab work & pertinent test results, reviewed documented beta blocker date and time   History of Anesthesia Complications Negative for: history of anesthetic complications  Airway Mallampati: II  TM Distance: >3 FB Neck ROM: Full    Dental  (+) Dental Advisory Given, Teeth Intact   Pulmonary neg pulmonary ROS   breath sounds clear to auscultation       Cardiovascular hypertension, Pt. on medications and Pt. on home beta blockers (-) angina + CAD and + Cardiac Stents  + Valvular Problems/Murmurs (mitral stenosis)  Rhythm:Regular Rate:Normal  01/15/2024 cath: severe 3v ASCAD 01/15/2024 ECHO: EF 65-70%, mod LVH, normal LVF, normal RVF, trivial MR, mild-mod Mitral stenosis with mean gradient 5 mmHg   Neuro/Psych negative neurological ROS     GI/Hepatic Neg liver ROS,GERD  Controlled,,  Endo/Other  diabetes (glu 149), Oral Hypoglycemic AgentsHypothyroidism    Renal/GU Renal InsufficiencyRenal disease     Musculoskeletal  (+) Arthritis ,    Abdominal   Peds  Hematology plavix   Anesthesia Other Findings   Reproductive/Obstetrics                             Anesthesia Physical Anesthesia Plan  ASA: 4  Anesthesia Plan: General   Post-op Pain Management:    Induction: Intravenous  PONV Risk Score and Plan: 2 and Treatment may vary due to age or medical condition  Airway Management Planned: Oral ETT  Additional Equipment: Arterial line, ClearSight, TEE, CVP and Ultrasound Guidance Line Placement  Intra-op Plan:   Post-operative Plan: Post-operative intubation/ventilation  Informed Consent: I have reviewed the patients History and Physical, chart, labs and discussed the procedure including the risks, benefits and alternatives for the  proposed anesthesia with the patient or authorized representative who has indicated his/her understanding and acceptance.     Dental advisory given  Plan Discussed with: CRNA and Surgeon  Anesthesia Plan Comments:        Anesthesia Quick Evaluation

## 2024-01-21 ENCOUNTER — Inpatient Hospital Stay (HOSPITAL_COMMUNITY): Payer: Medicare Other

## 2024-01-21 ENCOUNTER — Encounter (HOSPITAL_COMMUNITY): Payer: Self-pay | Admitting: Internal Medicine

## 2024-01-21 ENCOUNTER — Inpatient Hospital Stay (HOSPITAL_COMMUNITY)
Admission: EM | Disposition: A | Discharge status: Home Health | Payer: Self-pay | Source: Home / Self Care | Attending: Internal Medicine

## 2024-01-21 ENCOUNTER — Inpatient Hospital Stay (HOSPITAL_COMMUNITY): Payer: Self-pay | Admitting: Certified Registered"

## 2024-01-21 ENCOUNTER — Other Ambulatory Visit: Payer: Self-pay

## 2024-01-21 DIAGNOSIS — D62 Acute posthemorrhagic anemia: Secondary | ICD-10-CM

## 2024-01-21 DIAGNOSIS — K219 Gastro-esophageal reflux disease without esophagitis: Secondary | ICD-10-CM

## 2024-01-21 DIAGNOSIS — I4891 Unspecified atrial fibrillation: Secondary | ICD-10-CM | POA: Diagnosis not present

## 2024-01-21 DIAGNOSIS — R739 Hyperglycemia, unspecified: Secondary | ICD-10-CM

## 2024-01-21 DIAGNOSIS — Z951 Presence of aortocoronary bypass graft: Secondary | ICD-10-CM | POA: Diagnosis not present

## 2024-01-21 DIAGNOSIS — I2510 Atherosclerotic heart disease of native coronary artery without angina pectoris: Secondary | ICD-10-CM | POA: Diagnosis present

## 2024-01-21 DIAGNOSIS — E039 Hypothyroidism, unspecified: Secondary | ICD-10-CM

## 2024-01-21 HISTORY — PX: TEE WITHOUT CARDIOVERSION: SHX5443

## 2024-01-21 HISTORY — PX: MAZE: SHX5063

## 2024-01-21 HISTORY — PX: CLIPPING OF ATRIAL APPENDAGE: SHX5773

## 2024-01-21 HISTORY — PX: CORONARY ARTERY BYPASS GRAFT: SHX141

## 2024-01-21 LAB — POCT I-STAT 7, (LYTES, BLD GAS, ICA,H+H)
Acid-base deficit: 3 mmol/L — ABNORMAL HIGH (ref 0.0–2.0)
Acid-base deficit: 4 mmol/L — ABNORMAL HIGH (ref 0.0–2.0)
Acid-base deficit: 6 mmol/L — ABNORMAL HIGH (ref 0.0–2.0)
Acid-base deficit: 6 mmol/L — ABNORMAL HIGH (ref 0.0–2.0)
Acid-base deficit: 8 mmol/L — ABNORMAL HIGH (ref 0.0–2.0)
Acid-base deficit: 7 mmol/L — ABNORMAL HIGH (ref 0.0–2.0)
Acid-base deficit: 5 mmol/L — ABNORMAL HIGH (ref 0.0–2.0)
Bicarbonate: 22.9 mmol/L (ref 20.0–28.0)
Bicarbonate: 21.4 mmol/L (ref 20.0–28.0)
Bicarbonate: 20.4 mmol/L (ref 20.0–28.0)
Bicarbonate: 19 mmol/L — ABNORMAL LOW (ref 20.0–28.0)
Bicarbonate: 16.4 mmol/L — ABNORMAL LOW (ref 20.0–28.0)
Bicarbonate: 18.6 mmol/L — ABNORMAL LOW (ref 20.0–28.0)
Bicarbonate: 20.8 mmol/L (ref 20.0–28.0)
Calcium, Ion: 1.24 mmol/L (ref 1.15–1.40)
Calcium, Ion: 1.02 mmol/L — ABNORMAL LOW (ref 1.15–1.40)
Calcium, Ion: 1.17 mmol/L (ref 1.15–1.40)
Calcium, Ion: 1.14 mmol/L — ABNORMAL LOW (ref 1.15–1.40)
Calcium, Ion: 1.09 mmol/L — ABNORMAL LOW (ref 1.15–1.40)
Calcium, Ion: 1.09 mmol/L — ABNORMAL LOW (ref 1.15–1.40)
Calcium, Ion: 1.08 mmol/L — ABNORMAL LOW (ref 1.15–1.40)
HCT: 34 % — ABNORMAL LOW (ref 39.0–52.0)
HCT: 27 % — ABNORMAL LOW (ref 39.0–52.0)
HCT: 24 % — ABNORMAL LOW (ref 39.0–52.0)
HCT: 28 % — ABNORMAL LOW (ref 39.0–52.0)
HCT: 26 % — ABNORMAL LOW (ref 39.0–52.0)
HCT: 25 % — ABNORMAL LOW (ref 39.0–52.0)
HCT: 24 % — ABNORMAL LOW (ref 39.0–52.0)
Hemoglobin: 11.6 g/dL — ABNORMAL LOW (ref 13.0–17.0)
Hemoglobin: 9.2 g/dL — ABNORMAL LOW (ref 13.0–17.0)
Hemoglobin: 8.2 g/dL — ABNORMAL LOW (ref 13.0–17.0)
Hemoglobin: 9.5 g/dL — ABNORMAL LOW (ref 13.0–17.0)
Hemoglobin: 8.8 g/dL — ABNORMAL LOW (ref 13.0–17.0)
Hemoglobin: 8.5 g/dL — ABNORMAL LOW (ref 13.0–17.0)
Hemoglobin: 8.2 g/dL — ABNORMAL LOW (ref 13.0–17.0)
O2 Saturation: 100 %
O2 Saturation: 100 %
O2 Saturation: 95 %
O2 Saturation: 97 %
O2 Saturation: 97 %
O2 Saturation: 96 %
O2 Saturation: 96 %
Patient temperature: 37.2
Patient temperature: 36.9
Patient temperature: 37.3
Patient temperature: 37.6
Potassium: 3.9 mmol/L (ref 3.5–5.1)
Potassium: 4.5 mmol/L (ref 3.5–5.1)
Potassium: 4.3 mmol/L (ref 3.5–5.1)
Potassium: 4.2 mmol/L (ref 3.5–5.1)
Potassium: 3.7 mmol/L (ref 3.5–5.1)
Potassium: 3.6 mmol/L (ref 3.5–5.1)
Potassium: 3.7 mmol/L (ref 3.5–5.1)
Sodium: 140 mmol/L (ref 135–145)
Sodium: 139 mmol/L (ref 135–145)
Sodium: 138 mmol/L (ref 135–145)
Sodium: 140 mmol/L (ref 135–145)
Sodium: 143 mmol/L (ref 135–145)
Sodium: 143 mmol/L (ref 135–145)
Sodium: 143 mmol/L (ref 135–145)
TCO2: 24 mmol/L (ref 22–32)
TCO2: 23 mmol/L (ref 22–32)
TCO2: 22 mmol/L (ref 22–32)
TCO2: 20 mmol/L — ABNORMAL LOW (ref 22–32)
TCO2: 17 mmol/L — ABNORMAL LOW (ref 22–32)
TCO2: 20 mmol/L — ABNORMAL LOW (ref 22–32)
TCO2: 22 mmol/L (ref 22–32)
pCO2 arterial: 42.5 mmHg (ref 32–48)
pCO2 arterial: 40.6 mmHg (ref 32–48)
pCO2 arterial: 41.2 mmHg (ref 32–48)
pCO2 arterial: 33.7 mmHg (ref 32–48)
pCO2 arterial: 29.9 mmHg — ABNORMAL LOW (ref 32–48)
pCO2 arterial: 38.9 mmHg (ref 32–48)
pCO2 arterial: 41.7 mmHg (ref 32–48)
pH, Arterial: 7.339 — ABNORMAL LOW (ref 7.35–7.45)
pH, Arterial: 7.33 — ABNORMAL LOW (ref 7.35–7.45)
pH, Arterial: 7.302 — ABNORMAL LOW (ref 7.35–7.45)
pH, Arterial: 7.359 (ref 7.35–7.45)
pH, Arterial: 7.346 — ABNORMAL LOW (ref 7.35–7.45)
pH, Arterial: 7.289 — ABNORMAL LOW (ref 7.35–7.45)
pH, Arterial: 7.308 — ABNORMAL LOW (ref 7.35–7.45)
pO2, Arterial: 366 mmHg — ABNORMAL HIGH (ref 83–108)
pO2, Arterial: 399 mmHg — ABNORMAL HIGH (ref 83–108)
pO2, Arterial: 82 mmHg — ABNORMAL LOW (ref 83–108)
pO2, Arterial: 100 mmHg (ref 83–108)
pO2, Arterial: 93 mmHg (ref 83–108)
pO2, Arterial: 95 mmHg (ref 83–108)
pO2, Arterial: 89 mmHg (ref 83–108)

## 2024-01-21 LAB — COMPREHENSIVE METABOLIC PANEL WITH GFR
ALT: 25 U/L (ref 0–44)
AST: 22 U/L (ref 15–41)
Albumin: 3.3 g/dL — ABNORMAL LOW (ref 3.5–5.0)
Alkaline Phosphatase: 34 U/L — ABNORMAL LOW (ref 38–126)
Anion gap: 11 (ref 5–15)
BUN: 21 mg/dL (ref 8–23)
CO2: 21 mmol/L — ABNORMAL LOW (ref 22–32)
Calcium: 9.1 mg/dL (ref 8.9–10.3)
Chloride: 106 mmol/L (ref 98–111)
Creatinine, Ser: 1.37 mg/dL — ABNORMAL HIGH (ref 0.61–1.24)
GFR, Estimated: 52 mL/min — ABNORMAL LOW
Glucose, Bld: 160 mg/dL — ABNORMAL HIGH (ref 70–99)
Potassium: 3.9 mmol/L (ref 3.5–5.1)
Sodium: 138 mmol/L (ref 135–145)
Total Bilirubin: 1.1 mg/dL (ref 0.0–1.2)
Total Protein: 6.3 g/dL — ABNORMAL LOW (ref 6.5–8.1)

## 2024-01-21 LAB — POCT I-STAT, CHEM 8
BUN: 19 mg/dL (ref 8–23)
BUN: 18 mg/dL (ref 8–23)
BUN: 16 mg/dL (ref 8–23)
BUN: 16 mg/dL (ref 8–23)
BUN: 19 mg/dL (ref 8–23)
Calcium, Ion: 1.26 mmol/L (ref 1.15–1.40)
Calcium, Ion: 1.25 mmol/L (ref 1.15–1.40)
Calcium, Ion: 1.03 mmol/L — ABNORMAL LOW (ref 1.15–1.40)
Calcium, Ion: 0.93 mmol/L — ABNORMAL LOW (ref 1.15–1.40)
Calcium, Ion: 0.98 mmol/L — ABNORMAL LOW (ref 1.15–1.40)
Chloride: 104 mmol/L (ref 98–111)
Chloride: 106 mmol/L (ref 98–111)
Chloride: 103 mmol/L (ref 98–111)
Chloride: 102 mmol/L (ref 98–111)
Chloride: 103 mmol/L (ref 98–111)
Creatinine, Ser: 1.3 mg/dL — ABNORMAL HIGH (ref 0.61–1.24)
Creatinine, Ser: 1.3 mg/dL — ABNORMAL HIGH (ref 0.61–1.24)
Creatinine, Ser: 1.2 mg/dL (ref 0.61–1.24)
Creatinine, Ser: 1 mg/dL (ref 0.61–1.24)
Creatinine, Ser: 1.1 mg/dL (ref 0.61–1.24)
Glucose, Bld: 146 mg/dL — ABNORMAL HIGH (ref 70–99)
Glucose, Bld: 129 mg/dL — ABNORMAL HIGH (ref 70–99)
Glucose, Bld: 108 mg/dL — ABNORMAL HIGH (ref 70–99)
Glucose, Bld: 136 mg/dL — ABNORMAL HIGH (ref 70–99)
Glucose, Bld: 160 mg/dL — ABNORMAL HIGH (ref 70–99)
HCT: 33 % — ABNORMAL LOW (ref 39.0–52.0)
HCT: 33 % — ABNORMAL LOW (ref 39.0–52.0)
HCT: 26 % — ABNORMAL LOW (ref 39.0–52.0)
HCT: 22 % — ABNORMAL LOW (ref 39.0–52.0)
HCT: 23 % — ABNORMAL LOW (ref 39.0–52.0)
Hemoglobin: 11.2 g/dL — ABNORMAL LOW (ref 13.0–17.0)
Hemoglobin: 11.2 g/dL — ABNORMAL LOW (ref 13.0–17.0)
Hemoglobin: 8.8 g/dL — ABNORMAL LOW (ref 13.0–17.0)
Hemoglobin: 7.5 g/dL — ABNORMAL LOW (ref 13.0–17.0)
Hemoglobin: 7.8 g/dL — ABNORMAL LOW (ref 13.0–17.0)
Potassium: 3.9 mmol/L (ref 3.5–5.1)
Potassium: 3.9 mmol/L (ref 3.5–5.1)
Potassium: 4.5 mmol/L (ref 3.5–5.1)
Potassium: 4.8 mmol/L (ref 3.5–5.1)
Potassium: 5 mmol/L (ref 3.5–5.1)
Sodium: 139 mmol/L (ref 135–145)
Sodium: 139 mmol/L (ref 135–145)
Sodium: 139 mmol/L (ref 135–145)
Sodium: 136 mmol/L (ref 135–145)
Sodium: 138 mmol/L (ref 135–145)
TCO2: 24 mmol/L (ref 22–32)
TCO2: 23 mmol/L (ref 22–32)
TCO2: 22 mmol/L (ref 22–32)
TCO2: 23 mmol/L (ref 22–32)
TCO2: 25 mmol/L (ref 22–32)

## 2024-01-21 LAB — CBC
HCT: 38.7 % — ABNORMAL LOW (ref 39.0–52.0)
HCT: 28.9 % — ABNORMAL LOW (ref 39.0–52.0)
HCT: 26.6 % — ABNORMAL LOW (ref 39.0–52.0)
Hemoglobin: 13.3 g/dL (ref 13.0–17.0)
Hemoglobin: 9.9 g/dL — ABNORMAL LOW (ref 13.0–17.0)
Hemoglobin: 9.1 g/dL — ABNORMAL LOW (ref 13.0–17.0)
MCH: 30.9 pg (ref 26.0–34.0)
MCH: 31.6 pg (ref 26.0–34.0)
MCH: 31.3 pg (ref 26.0–34.0)
MCHC: 34.4 g/dL (ref 30.0–36.0)
MCHC: 34.3 g/dL (ref 30.0–36.0)
MCHC: 34.2 g/dL (ref 30.0–36.0)
MCV: 90 fL (ref 80.0–100.0)
MCV: 92.3 fL (ref 80.0–100.0)
MCV: 91.4 fL (ref 80.0–100.0)
Platelets: 127 10*3/uL — ABNORMAL LOW (ref 150–400)
Platelets: 73 10*3/uL — ABNORMAL LOW (ref 150–400)
Platelets: 90 10*3/uL — ABNORMAL LOW (ref 150–400)
RBC: 4.3 MIL/uL (ref 4.22–5.81)
RBC: 3.13 MIL/uL — ABNORMAL LOW (ref 4.22–5.81)
RBC: 2.91 MIL/uL — ABNORMAL LOW (ref 4.22–5.81)
RDW: 13.3 % (ref 11.5–15.5)
RDW: 13.4 % (ref 11.5–15.5)
RDW: 13.5 % (ref 11.5–15.5)
WBC: 6.5 10*3/uL (ref 4.0–10.5)
WBC: 11.8 10*3/uL — ABNORMAL HIGH (ref 4.0–10.5)
WBC: 12.5 10*3/uL — ABNORMAL HIGH (ref 4.0–10.5)
nRBC: 0 % (ref 0.0–0.2)
nRBC: 0 % (ref 0.0–0.2)
nRBC: 0 % (ref 0.0–0.2)

## 2024-01-21 LAB — ECHO INTRAOPERATIVE TEE
AV Mean grad: 1 mmHg
Height: 72 in
Weight: 3594.38 [oz_av]

## 2024-01-21 LAB — BASIC METABOLIC PANEL
Anion gap: 16 — ABNORMAL HIGH (ref 5–15)
BUN: 15 mg/dL (ref 8–23)
CO2: 22 mmol/L (ref 22–32)
Calcium: 7.8 mg/dL — ABNORMAL LOW (ref 8.9–10.3)
Chloride: 104 mmol/L (ref 98–111)
Creatinine, Ser: 1.3 mg/dL — ABNORMAL HIGH (ref 0.61–1.24)
GFR, Estimated: 55 mL/min — ABNORMAL LOW (ref >60)
Glucose, Bld: 172 mg/dL — ABNORMAL HIGH (ref 70–99)
Potassium: 3.7 mmol/L (ref 3.5–5.1)
Sodium: 142 mmol/L (ref 135–145)

## 2024-01-21 LAB — APTT
aPTT: 86 s — ABNORMAL HIGH (ref 24–36)
aPTT: 33 s (ref 24–36)

## 2024-01-21 LAB — PREPARE RBC (CROSSMATCH)

## 2024-01-21 LAB — GLUCOSE, CAPILLARY
Glucose-Capillary: 117 mg/dL — ABNORMAL HIGH (ref 70–99)
Glucose-Capillary: 136 mg/dL — ABNORMAL HIGH (ref 70–99)
Glucose-Capillary: 140 mg/dL — ABNORMAL HIGH (ref 70–99)
Glucose-Capillary: 140 mg/dL — ABNORMAL HIGH (ref 70–99)
Glucose-Capillary: 146 mg/dL — ABNORMAL HIGH (ref 70–99)
Glucose-Capillary: 148 mg/dL — ABNORMAL HIGH (ref 70–99)
Glucose-Capillary: 149 mg/dL — ABNORMAL HIGH (ref 70–99)
Glucose-Capillary: 156 mg/dL — ABNORMAL HIGH (ref 70–99)
Glucose-Capillary: 164 mg/dL — ABNORMAL HIGH (ref 70–99)

## 2024-01-21 LAB — PROTIME-INR
INR: 1.5 — ABNORMAL HIGH (ref 0.8–1.2)
Prothrombin Time: 18.7 s — ABNORMAL HIGH (ref 11.4–15.2)

## 2024-01-21 LAB — HEPARIN LEVEL (UNFRACTIONATED): Heparin Unfractionated: 0.49 [IU]/mL (ref 0.30–0.70)

## 2024-01-21 LAB — HEMOGLOBIN AND HEMATOCRIT, BLOOD
HCT: 22.6 % — ABNORMAL LOW (ref 39.0–52.0)
Hemoglobin: 7.6 g/dL — ABNORMAL LOW (ref 13.0–17.0)

## 2024-01-21 LAB — SURGICAL PCR SCREEN
MRSA, PCR: NEGATIVE
Staphylococcus aureus: NEGATIVE

## 2024-01-21 LAB — MAGNESIUM: Magnesium: 2.8 mg/dL — ABNORMAL HIGH (ref 1.7–2.4)

## 2024-01-21 LAB — PLATELET COUNT: Platelets: 107 10*3/uL — ABNORMAL LOW (ref 150–400)

## 2024-01-21 SURGERY — CORONARY ARTERY BYPASS GRAFTING (CABG)
Anesthesia: General | Site: Chest

## 2024-01-21 MED ORDER — ASPIRIN 325 MG PO TBEC
325.0000 mg | DELAYED_RELEASE_TABLET | Freq: Every day | ORAL | Status: DC
  Administered 2024-01-22: 325 mg via ORAL
  Filled 2024-01-21: qty 1

## 2024-01-21 MED ORDER — SODIUM CHLORIDE 0.9% FLUSH
3.0000 mL | Freq: Two times a day (BID) | INTRAVENOUS | Status: DC
  Administered 2024-01-21: 10 mL via INTRAVENOUS

## 2024-01-21 MED ORDER — REVEFENACIN 175 MCG/3ML IN SOLN: 175.0000 ug | Freq: Every day | RESPIRATORY_TRACT | Status: DC

## 2024-01-21 MED ORDER — LACTATED RINGERS IV SOLN
INTRAVENOUS | Status: DC | PRN
Start: 2024-01-21 — End: 2024-01-21

## 2024-01-21 MED ORDER — FENTANYL CITRATE (PF) 250 MCG/5ML IJ SOLN
INTRAMUSCULAR | Status: DC | PRN
  Administered 2024-01-21: 200 ug via INTRAVENOUS
  Administered 2024-01-21 (×3): 100 ug via INTRAVENOUS
  Administered 2024-01-21 (×2): 200 ug via INTRAVENOUS
  Administered 2024-01-21: 100 ug via INTRAVENOUS

## 2024-01-21 MED ORDER — ACETAMINOPHEN 160 MG/5ML PO SOLN
650.0000 mg | Freq: Once | ORAL | Status: AC
  Administered 2024-01-21: 650 mg
  Filled 2024-01-21: qty 20.3

## 2024-01-21 MED ORDER — CHLORHEXIDINE GLUCONATE CLOTH 2 % EX PADS
6.0000 | MEDICATED_PAD | Freq: Every day | CUTANEOUS | Status: DC
  Administered 2024-01-21 – 2024-01-24 (×4): 6 via TOPICAL

## 2024-01-21 MED ORDER — SODIUM CHLORIDE (PF) 0.9 % IJ SOLN
OROMUCOSAL | Status: DC | PRN
  Administered 2024-01-21 (×2): 4 mL via TOPICAL

## 2024-01-21 MED ORDER — VANCOMYCIN HCL IN DEXTROSE 1-5 GM/200ML-% IV SOLN
1000.0000 mg | Freq: Once | INTRAVENOUS | Status: AC
  Administered 2024-01-21: 1000 mg via INTRAVENOUS
  Filled 2024-01-21: qty 200

## 2024-01-21 MED ORDER — ACETAMINOPHEN 160 MG/5ML PO SOLN: 1000.0000 mg | Freq: Four times a day (QID) | ORAL | Status: DC

## 2024-01-21 MED ORDER — ARFORMOTEROL TARTRATE 15 MCG/2ML IN NEBU
15.0000 ug | INHALATION_SOLUTION | Freq: Two times a day (BID) | RESPIRATORY_TRACT | Status: DC
  Administered 2024-01-21: 15 ug via RESPIRATORY_TRACT
  Filled 2024-01-21: qty 2

## 2024-01-21 MED ORDER — DEXMEDETOMIDINE HCL IN NACL 400 MCG/100ML IV SOLN
0.0000 ug/kg/h | INTRAVENOUS | Status: DC
  Administered 2024-01-21: 0.7 ug/kg/h via INTRAVENOUS
  Filled 2024-01-21: qty 100

## 2024-01-21 MED ORDER — PROTAMINE SULFATE 10 MG/ML IV SOLN
INTRAVENOUS | Status: AC
  Filled 2024-01-21: qty 50

## 2024-01-21 MED ORDER — OXYCODONE HCL 5 MG PO TABS
5.0000 mg | ORAL_TABLET | ORAL | Status: DC | PRN
  Administered 2024-01-21 – 2024-01-23 (×8): 5 mg via ORAL
  Filled 2024-01-21 (×9): qty 1

## 2024-01-21 MED ORDER — SODIUM CHLORIDE 0.9% FLUSH: 3.0000 mL | INTRAVENOUS | Status: DC | PRN

## 2024-01-21 MED ORDER — POTASSIUM CHLORIDE 10 MEQ/50ML IV SOLN
10.0000 meq | INTRAVENOUS | Status: AC
  Administered 2024-01-21 (×2): 10 meq via INTRAVENOUS

## 2024-01-21 MED ORDER — PHENYLEPHRINE 80 MCG/ML (10ML) SYRINGE FOR IV PUSH (FOR BLOOD PRESSURE SUPPORT)
PREFILLED_SYRINGE | INTRAVENOUS | Status: AC
  Filled 2024-01-21: qty 10

## 2024-01-21 MED ORDER — PROTAMINE SULFATE 10 MG/ML IV SOLN
INTRAVENOUS | Status: DC | PRN
  Administered 2024-01-21: 360 mg via INTRAVENOUS

## 2024-01-21 MED ORDER — METOPROLOL TARTRATE 5 MG/5ML IV SOLN: 2.5000 mg | INTRAVENOUS | Status: DC | PRN

## 2024-01-21 MED ORDER — PHENYLEPHRINE HCL-NACL 20-0.9 MG/250ML-% IV SOLN
30.0000 ug/min | INTRAVENOUS | Status: AC
  Filled 2024-01-21: qty 250

## 2024-01-21 MED ORDER — MAGNESIUM SULFATE 4 GM/100ML IV SOLN
4.0000 g | Freq: Once | INTRAVENOUS | Status: AC
  Administered 2024-01-21: 4 g via INTRAVENOUS
  Filled 2024-01-21: qty 100

## 2024-01-21 MED ORDER — METHOCARBAMOL 500 MG PO TABS
500.0000 mg | ORAL_TABLET | Freq: Three times a day (TID) | ORAL | Status: DC | PRN
  Administered 2024-01-21 – 2024-01-22 (×3): 500 mg via ORAL
  Filled 2024-01-21 (×4): qty 1

## 2024-01-21 MED ORDER — SODIUM BICARBONATE 8.4 % IV SOLN
50.0000 meq | Freq: Once | INTRAVENOUS | Status: AC
  Administered 2024-01-21: 50 meq via INTRAVENOUS

## 2024-01-21 MED ORDER — PHENYLEPHRINE HCL-NACL 20-0.9 MG/250ML-% IV SOLN
0.0000 ug/min | INTRAVENOUS | Status: DC
  Administered 2024-01-21: 20 ug/min via INTRAVENOUS

## 2024-01-21 MED ORDER — DOCUSATE SODIUM 100 MG PO CAPS
200.0000 mg | ORAL_CAPSULE | Freq: Every day | ORAL | Status: DC
  Administered 2024-01-22: 200 mg via ORAL
  Filled 2024-01-21: qty 2

## 2024-01-21 MED ORDER — FENTANYL CITRATE (PF) 250 MCG/5ML IJ SOLN
INTRAMUSCULAR | Status: AC
  Filled 2024-01-21: qty 5

## 2024-01-21 MED ORDER — HYDROMORPHONE HCL 1 MG/ML IJ SOLN
0.5000 mg | Freq: Once | INTRAMUSCULAR | Status: AC
  Administered 2024-01-21: 0.5 mg via INTRAVENOUS
  Filled 2024-01-21: qty 0.5

## 2024-01-21 MED ORDER — METOCLOPRAMIDE HCL 5 MG/ML IJ SOLN
10.0000 mg | Freq: Four times a day (QID) | INTRAMUSCULAR | Status: AC
  Administered 2024-01-21 – 2024-01-22 (×6): 10 mg via INTRAVENOUS
  Filled 2024-01-21 (×6): qty 2

## 2024-01-21 MED ORDER — ALBUTEROL SULFATE HFA 108 (90 BASE) MCG/ACT IN AERS
INHALATION_SPRAY | RESPIRATORY_TRACT | Status: DC | PRN
  Administered 2024-01-21: 6 via RESPIRATORY_TRACT

## 2024-01-21 MED ORDER — ESMOLOL HCL 100 MG/10ML IV SOLN
INTRAVENOUS | Status: DC | PRN
  Administered 2024-01-21 (×4): 10 ug via INTRAVENOUS

## 2024-01-21 MED ORDER — METOPROLOL TARTRATE 12.5 MG HALF TABLET: 12.5000 mg | ORAL_TABLET | Freq: Two times a day (BID) | ORAL | Status: DC

## 2024-01-21 MED ORDER — POTASSIUM CHLORIDE 10 MEQ/50ML IV SOLN: 10.0000 meq | INTRAVENOUS | Status: AC

## 2024-01-21 MED ORDER — MIDAZOLAM HCL (PF) 5 MG/ML IJ SOLN
INTRAMUSCULAR | Status: DC | PRN
  Administered 2024-01-21: 3 mg via INTRAVENOUS
  Administered 2024-01-21: 2 mg via INTRAVENOUS

## 2024-01-21 MED ORDER — SODIUM CHLORIDE 0.9% FLUSH
3.0000 mL | Freq: Two times a day (BID) | INTRAVENOUS | Status: DC
  Administered 2024-01-22: 10 mL via INTRAVENOUS
  Administered 2024-01-22 – 2024-01-26 (×5): 3 mL via INTRAVENOUS

## 2024-01-21 MED ORDER — PLASMA-LYTE A IV SOLN
INTRAVENOUS | Status: DC | PRN
  Administered 2024-01-21: 500 mL via INTRAVASCULAR

## 2024-01-21 MED ORDER — ONDANSETRON HCL 4 MG/2ML IJ SOLN
4.0000 mg | Freq: Four times a day (QID) | INTRAMUSCULAR | Status: DC | PRN
  Administered 2024-01-21: 4 mg via INTRAVENOUS
  Filled 2024-01-21: qty 2

## 2024-01-21 MED ORDER — BISACODYL 5 MG PO TBEC
10.0000 mg | DELAYED_RELEASE_TABLET | Freq: Every day | ORAL | Status: DC
  Administered 2024-01-22 – 2024-01-26 (×4): 10 mg via ORAL
  Filled 2024-01-21 (×4): qty 2

## 2024-01-21 MED ORDER — ROCURONIUM BROMIDE 10 MG/ML (PF) SYRINGE
PREFILLED_SYRINGE | INTRAVENOUS | Status: AC
  Filled 2024-01-21: qty 10

## 2024-01-21 MED ORDER — SODIUM CHLORIDE 0.9% FLUSH
3.0000 mL | Freq: Two times a day (BID) | INTRAVENOUS | Status: DC
  Administered 2024-01-21 – 2024-01-22 (×2): 10 mL via INTRAVENOUS
  Administered 2024-01-23: 3 mL via INTRAVENOUS

## 2024-01-21 MED ORDER — INSULIN REGULAR(HUMAN) IN NACL 100-0.9 UT/100ML-% IV SOLN
INTRAVENOUS | Status: DC
  Administered 2024-01-21: 4 [IU]/h via INTRAVENOUS

## 2024-01-21 MED ORDER — ALBUMIN HUMAN 5 % IV SOLN: INTRAVENOUS | Status: DC | PRN

## 2024-01-21 MED ORDER — CLEVIDIPINE BUTYRATE 0.5 MG/ML IV EMUL
INTRAVENOUS | Status: AC
  Filled 2024-01-21: qty 50

## 2024-01-21 MED ORDER — ORAL CARE MOUTH RINSE: 15.0000 mL | OROMUCOSAL | Status: DC | PRN

## 2024-01-21 MED ORDER — SODIUM BICARBONATE 8.4 % IV SOLN: 100.0000 meq | Freq: Once | INTRAVENOUS | Status: AC

## 2024-01-21 MED ORDER — TRAMADOL HCL 50 MG PO TABS
50.0000 mg | ORAL_TABLET | Freq: Four times a day (QID) | ORAL | Status: DC | PRN
  Administered 2024-01-21 – 2024-01-23 (×3): 50 mg via ORAL
  Filled 2024-01-21 (×5): qty 1

## 2024-01-21 MED ORDER — SODIUM CHLORIDE 0.9 % IV SOLN: 250.0000 mL | INTRAVENOUS | Status: AC

## 2024-01-21 MED ORDER — NICARDIPINE HCL IN NACL 20-0.86 MG/200ML-% IV SOLN: 0.0000 mg/h | INTRAVENOUS | Status: DC

## 2024-01-21 MED ORDER — AMIODARONE HCL IN DEXTROSE 360-4.14 MG/200ML-% IV SOLN
30.0000 mg/h | INTRAVENOUS | Status: DC
  Administered 2024-01-21 (×2): 30 mg/h via INTRAVENOUS
  Filled 2024-01-21: qty 200

## 2024-01-21 MED ORDER — EPHEDRINE 5 MG/ML INJ
INTRAVENOUS | Status: AC
  Filled 2024-01-21: qty 5

## 2024-01-21 MED ORDER — PANTOPRAZOLE SODIUM 40 MG PO TBEC
40.0000 mg | DELAYED_RELEASE_TABLET | Freq: Every day | ORAL | Status: DC
  Administered 2024-01-23 – 2024-01-26 (×4): 40 mg via ORAL
  Filled 2024-01-21 (×4): qty 1

## 2024-01-21 MED ORDER — 0.9 % SODIUM CHLORIDE (POUR BTL) OPTIME
TOPICAL | Status: DC | PRN
  Administered 2024-01-21: 5000 mL

## 2024-01-21 MED ORDER — DEXTROSE 50 % IV SOLN: 0.0000 mL | INTRAVENOUS | Status: DC | PRN

## 2024-01-21 MED ORDER — ASPIRIN 81 MG PO CHEW: 324.0000 mg | CHEWABLE_TABLET | Freq: Every day | ORAL | Status: DC

## 2024-01-21 MED ORDER — METOPROLOL TARTRATE 25 MG/10 ML ORAL SUSPENSION: 12.5000 mg | Freq: Two times a day (BID) | ORAL | Status: DC

## 2024-01-21 MED ORDER — NOREPINEPHRINE 4 MG/250ML-% IV SOLN
0.0000 ug/min | INTRAVENOUS | Status: DC
Start: 2024-01-21 — End: 2024-01-22

## 2024-01-21 MED ORDER — TRANEXAMIC ACID 1000 MG/10ML IV SOLN
1.5000 mg/kg/h | INTRAVENOUS | Status: DC
  Filled 2024-01-21: qty 25

## 2024-01-21 MED ORDER — PHENYLEPHRINE 80 MCG/ML (10ML) SYRINGE FOR IV PUSH (FOR BLOOD PRESSURE SUPPORT)
PREFILLED_SYRINGE | INTRAVENOUS | Status: DC | PRN
  Administered 2024-01-21: 160 ug via INTRAVENOUS
  Administered 2024-01-21 (×3): 80 ug via INTRAVENOUS
  Administered 2024-01-21 (×2): 160 ug via INTRAVENOUS
  Administered 2024-01-21: 80 ug via INTRAVENOUS

## 2024-01-21 MED ORDER — ADULT MULTIVITAMIN W/MINERALS CH
1.0000 | ORAL_TABLET | Freq: Every day | ORAL | Status: DC
  Administered 2024-01-22 – 2024-01-26 (×5): 1 via ORAL
  Filled 2024-01-21 (×5): qty 1

## 2024-01-21 MED ORDER — MIDAZOLAM HCL (PF) 10 MG/2ML IJ SOLN
INTRAMUSCULAR | Status: AC
  Filled 2024-01-21: qty 2

## 2024-01-21 MED ORDER — PANTOPRAZOLE SODIUM 40 MG IV SOLR
40.0000 mg | Freq: Every day | INTRAVENOUS | Status: AC
  Administered 2024-01-21 – 2024-01-22 (×2): 40 mg via INTRAVENOUS
  Filled 2024-01-21 (×2): qty 10

## 2024-01-21 MED ORDER — LIDOCAINE 2% (20 MG/ML) 5 ML SYRINGE
INTRAMUSCULAR | Status: AC
  Filled 2024-01-21: qty 5

## 2024-01-21 MED ORDER — POTASSIUM CHLORIDE 10 MEQ/50ML IV SOLN
INTRAVENOUS | Status: AC
Start: 2024-01-21 — End: 2024-01-21
  Administered 2024-01-21: 10 meq via INTRAVENOUS
  Filled 2024-01-21: qty 50

## 2024-01-21 MED ORDER — ALBUMIN HUMAN 5 % IV SOLN
250.0000 mL | INTRAVENOUS | Status: DC | PRN
  Administered 2024-01-21 (×3): 12.5 g via INTRAVENOUS
  Filled 2024-01-21 (×3): qty 250

## 2024-01-21 MED ORDER — AMIODARONE HCL IN DEXTROSE 360-4.14 MG/200ML-% IV SOLN
INTRAVENOUS | Status: DC | PRN
  Administered 2024-01-21: 30 mg/h via INTRAVENOUS

## 2024-01-21 MED ORDER — BISACODYL 10 MG RE SUPP
10.0000 mg | Freq: Every day | RECTAL | Status: DC
  Filled 2024-01-21: qty 1

## 2024-01-21 MED ORDER — ROCURONIUM BROMIDE 10 MG/ML (PF) SYRINGE
PREFILLED_SYRINGE | INTRAVENOUS | Status: DC | PRN
  Administered 2024-01-21: 30 mg via INTRAVENOUS
  Administered 2024-01-21: 50 mg via INTRAVENOUS
  Administered 2024-01-21: 70 mg via INTRAVENOUS

## 2024-01-21 MED ORDER — PROPOFOL 10 MG/ML IV BOLUS
INTRAVENOUS | Status: DC | PRN
  Administered 2024-01-21: 50 mg via INTRAVENOUS
  Administered 2024-01-21: 30 mg via INTRAVENOUS

## 2024-01-21 MED ORDER — RISAQUAD PO CAPS
1.0000 | ORAL_CAPSULE | Freq: Every day | ORAL | Status: DC
  Administered 2024-01-22 – 2024-01-26 (×5): 1 via ORAL
  Filled 2024-01-21 (×5): qty 1

## 2024-01-21 MED ORDER — ACETAMINOPHEN 500 MG PO TABS
1000.0000 mg | ORAL_TABLET | Freq: Four times a day (QID) | ORAL | Status: DC
  Administered 2024-01-21 – 2024-01-26 (×16): 1000 mg via ORAL
  Filled 2024-01-21 (×17): qty 2

## 2024-01-21 MED ORDER — SODIUM BICARBONATE 8.4 % IV SOLN
INTRAVENOUS | Status: AC
Start: 2024-01-21 — End: 2024-01-21
  Administered 2024-01-21: 100 meq via INTRAVENOUS
  Filled 2024-01-21: qty 50

## 2024-01-21 MED ORDER — PROPOFOL 10 MG/ML IV BOLUS
INTRAVENOUS | Status: AC
  Filled 2024-01-21: qty 20

## 2024-01-21 MED ORDER — NITROGLYCERIN IN D5W 200-5 MCG/ML-% IV SOLN: 0.0000 ug/min | INTRAVENOUS | Status: DC

## 2024-01-21 MED ORDER — CHLORHEXIDINE GLUCONATE 0.12 % MT SOLN
15.0000 mL | OROMUCOSAL | Status: AC
  Administered 2024-01-21: 15 mL via OROMUCOSAL
  Filled 2024-01-21: qty 15

## 2024-01-21 MED ORDER — CEFAZOLIN SODIUM-DEXTROSE 2-4 GM/100ML-% IV SOLN
2.0000 g | Freq: Three times a day (TID) | INTRAVENOUS | Status: AC
  Administered 2024-01-21 – 2024-01-23 (×6): 2 g via INTRAVENOUS
  Filled 2024-01-21 (×6): qty 100

## 2024-01-21 MED ORDER — MORPHINE SULFATE (PF) 2 MG/ML IV SOLN
1.0000 mg | INTRAVENOUS | Status: DC | PRN
  Administered 2024-01-21 – 2024-01-22 (×7): 2 mg via INTRAVENOUS
  Administered 2024-01-22 – 2024-01-23 (×3): 4 mg via INTRAVENOUS
  Administered 2024-01-23: 2 mg via INTRAVENOUS
  Filled 2024-01-21: qty 2
  Filled 2024-01-21: qty 1
  Filled 2024-01-21: qty 2
  Filled 2024-01-21 (×4): qty 1
  Filled 2024-01-21: qty 2
  Filled 2024-01-21 (×2): qty 1
  Filled 2024-01-21: qty 2
  Filled 2024-01-21: qty 1

## 2024-01-21 MED ORDER — HYDROMORPHONE HCL 1 MG/ML IJ SOLN: 0.5000 mg | Freq: Once | INTRAMUSCULAR | Status: DC

## 2024-01-21 MED ORDER — SODIUM CHLORIDE 0.9% FLUSH
3.0000 mL | Freq: Two times a day (BID) | INTRAVENOUS | Status: DC
  Administered 2024-01-21: 10 mL via INTRAVENOUS
  Administered 2024-01-23: 3 mL via INTRAVENOUS

## 2024-01-21 MED ORDER — VASOPRESSIN 20 UNIT/ML IV SOLN
INTRAVENOUS | Status: AC
  Filled 2024-01-21: qty 1

## 2024-01-21 MED ORDER — ASPIRIN 81 MG PO CHEW
324.0000 mg | CHEWABLE_TABLET | Freq: Once | ORAL | Status: AC
  Administered 2024-01-21: 324 mg via ORAL
  Filled 2024-01-21: qty 4

## 2024-01-21 MED ORDER — HEPARIN SODIUM (PORCINE) 1000 UNIT/ML IJ SOLN
INTRAMUSCULAR | Status: DC | PRN
  Administered 2024-01-21: 36000 [IU] via INTRAVENOUS

## 2024-01-21 MED ORDER — MIDAZOLAM HCL 2 MG/2ML IJ SOLN: 2.0000 mg | INTRAMUSCULAR | Status: DC | PRN

## 2024-01-21 SURGICAL SUPPLY — 74 items
ARTICLIP LAA PROCLIP II 45 (Clip) ×2 IMPLANT
BAG DECANTER FOR FLEXI CONT (MISCELLANEOUS) ×2 IMPLANT
BLADE CLIPPER SURG (BLADE) ×2 IMPLANT
BLADE STERNUM SYSTEM 6 (BLADE) ×2 IMPLANT
BLADE SURG 11 STRL SS (BLADE) IMPLANT
BNDG ELASTIC 4INX 5YD STR LF (GAUZE/BANDAGES/DRESSINGS) IMPLANT
BNDG ELASTIC 6INX 5YD STR LF (GAUZE/BANDAGES/DRESSINGS) ×2 IMPLANT
BNDG GAUZE DERMACEA FLUFF 4 (GAUZE/BANDAGES/DRESSINGS) ×2 IMPLANT
CANISTER SUCT 3000ML PPV (MISCELLANEOUS) ×2 IMPLANT
CANNULA MC2 2 STG 29/37 NON-V (CANNULA) ×2 IMPLANT
CANNULA NON VENT 20FR 12 (CANNULA) ×2 IMPLANT
CANNULA VESSEL 3MM BLUNT TIP (CANNULA) IMPLANT
CATH ROBINSON RED A/P 18FR (CATHETERS) ×4 IMPLANT
CLAMP ISOLATOR ENCOMPASS RF (ELECTROSURGICAL) IMPLANT
CLIP TI MEDIUM 24 (CLIP) IMPLANT
CONNECTOR BLAKE 2:1 CARIO BLK (MISCELLANEOUS) ×2 IMPLANT
CONTAINER PROTECT SURGISLUSH (MISCELLANEOUS) ×4 IMPLANT
DERMABOND ADVANCED .7 DNX12 (GAUZE/BANDAGES/DRESSINGS) IMPLANT
DEVICE ATRICLIP LAA PRCLPII 45 (Clip) IMPLANT
DRAIN CHANNEL 19F RND (DRAIN) ×6 IMPLANT
DRAIN CONNECTOR BLAKE 1:1 (MISCELLANEOUS) IMPLANT
DRAPE SRG 135X102X78XABS (DRAPES) ×2 IMPLANT
DRAPE WARM FLUID 44X44 (DRAPES) ×2 IMPLANT
DRSG AQUACEL AG ADV 3.5X10 (GAUZE/BANDAGES/DRESSINGS) ×2 IMPLANT
ELECT BLADE 4.0 EZ CLEAN MEGAD (MISCELLANEOUS) ×2 IMPLANT
ELECT REM PT RETURN 9FT ADLT (ELECTROSURGICAL) ×4 IMPLANT
ELECTRODE BLDE 4.0 EZ CLN MEGD (MISCELLANEOUS) ×2 IMPLANT
ELECTRODE REM PT RTRN 9FT ADLT (ELECTROSURGICAL) ×4 IMPLANT
FELT TEFLON 1X6 (MISCELLANEOUS) ×4 IMPLANT
GAUZE SPONGE 4X4 12PLY STRL (GAUZE/BANDAGES/DRESSINGS) ×4 IMPLANT
GLOVE BIOGEL M STRL SZ7.5 (GLOVE) ×4 IMPLANT
GOWN STRL REUS W/ TWL LRG LVL3 (GOWN DISPOSABLE) ×8 IMPLANT
GOWN STRL REUS W/ TWL XL LVL3 (GOWN DISPOSABLE) ×4 IMPLANT
HEMOSTAT POWDER SURGIFOAM 1G (HEMOSTASIS) IMPLANT
INSERT SUTURE HOLDER (MISCELLANEOUS) ×2 IMPLANT
KIT BASIN OR (CUSTOM PROCEDURE TRAY) ×2 IMPLANT
KIT SUCTION CATH 14FR (SUCTIONS) ×2 IMPLANT
KIT TURNOVER KIT B (KITS) ×2 IMPLANT
KIT VASOVIEW HEMOPRO 2 VH 4000 (KITS) ×2 IMPLANT
LEAD PACING MYOCARDI (MISCELLANEOUS) ×2 IMPLANT
MARKER GRAFT CORONARY BYPASS (MISCELLANEOUS) ×6 IMPLANT
NS IRRIG 1000ML POUR BTL (IV SOLUTION) ×10 IMPLANT
PACK E OPEN HEART (SUTURE) ×2 IMPLANT
PACK OPEN HEART (CUSTOM PROCEDURE TRAY) ×2 IMPLANT
PAD ARMBOARD 7.5X6 YLW CONV (MISCELLANEOUS) ×4 IMPLANT
PAD ELECT DEFIB RADIOL ZOLL (MISCELLANEOUS) ×2 IMPLANT
PENCIL BUTTON HOLSTER BLD 10FT (ELECTRODE) ×2 IMPLANT
POSITIONER HEAD DONUT 9IN (MISCELLANEOUS) ×2 IMPLANT
PUNCH AORTIC ROTATE 4.0MM (MISCELLANEOUS) ×2 IMPLANT
SET MPS 3-ND DEL (MISCELLANEOUS) IMPLANT
SPONGE T-LAP 18X18 ~~LOC~~+RFID (SPONGE) ×8 IMPLANT
SUPPORT HEART JANKE-BARRON (MISCELLANEOUS) ×2 IMPLANT
SUT BONE WAX W31G (SUTURE) ×2 IMPLANT
SUT ETHIBOND X763 2 0 SH 1 (SUTURE) ×4 IMPLANT
SUT MNCRL AB 3-0 PS2 18 (SUTURE) ×4 IMPLANT
SUT MNCRL AB 4-0 PS2 18 (SUTURE) IMPLANT
SUT PDS AB 1 CTX 36 (SUTURE) ×4 IMPLANT
SUT PROLENE 4 0 SH DA (SUTURE) ×2 IMPLANT
SUT PROLENE 5 0 C 1 36 (SUTURE) ×6 IMPLANT
SUT PROLENE 7 0 BV 1 (SUTURE) IMPLANT
SUT PROLENE 7 0 BV1 MDA (SUTURE) ×2 IMPLANT
SUT SILK 1 MH (SUTURE) IMPLANT
SUT STEEL STERNAL CCS#1 18IN (SUTURE) IMPLANT
SUT VIC AB 2-0 CT1 TAPERPNT 27 (SUTURE) IMPLANT
SUT VIC AB 2-0 CTX 36 (SUTURE) IMPLANT
SYSTEM SAHARA CHEST DRAIN ATS (WOUND CARE) ×2 IMPLANT
TAPE CLOTH SURG 4X10 WHT LF (GAUZE/BANDAGES/DRESSINGS) IMPLANT
TAPE PAPER 2X10 WHT MICROPORE (GAUZE/BANDAGES/DRESSINGS) IMPLANT
TOWEL GREEN STERILE (TOWEL DISPOSABLE) ×2 IMPLANT
TOWEL GREEN STERILE FF (TOWEL DISPOSABLE) ×2 IMPLANT
TRAY FOLEY SLVR 16FR TEMP STAT (SET/KITS/TRAYS/PACK) ×2 IMPLANT
TUBING LAP HI FLOW INSUFFLATIO (TUBING) ×2 IMPLANT
UNDERPAD 30X36 HEAVY ABSORB (UNDERPADS AND DIAPERS) ×2 IMPLANT
WATER STERILE IRR 1000ML POUR (IV SOLUTION) ×4 IMPLANT

## 2024-01-21 NOTE — Anesthesia Procedure Notes (Signed)
 Arterial Line Insertion Start/End2/25/2025 7:00 AM, 01/21/2024 7:20 AM Performed by: Jairo Ben, MD, Alwyn Ren, CRNA, CRNA  Patient location: Pre-op. Preanesthetic checklist: patient identified, IV checked, site marked, risks and benefits discussed, surgical consent, monitors and equipment checked, pre-op evaluation, timeout performed and anesthesia consent Lidocaine 1% used for infiltration Left, radial was placed Catheter size: 20 G Hand hygiene performed  and maximum sterile barriers used   Attempts: 1 Procedure performed using ultrasound guided technique. Ultrasound Notes:anatomy identified, needle tip was noted to be adjacent to the nerve/plexus identified and no ultrasound evidence of intravascular and/or intraneural injection Following insertion, dressing applied. Post procedure assessment: normal and unchanged  Post procedure complications: unsuccessful attempts. Additional procedure comments: Attempted x 2 by SRNA, flash but unable to thread. US guidance and placed on second attempt by CRNA.

## 2024-01-21 NOTE — Progress Notes (Signed)
      [address]       [place] [address]             [phone]      S/p CABG, PVI, LAA clip  Recently extubated  BP (!) 84/55   Pulse 80   Temp 99.5 F (37.5 C)   Resp (!) 24   Ht 6' (1.829 m)   Wt 101.9 kg   SpO2 99%   BMI 30.47 kg/m  4L [place] 99% sat CI 3.3 by Flow track, CVP 3 Post extubation- 7.31/42/89/22/-5   Intake/Output Summary (Last 24 hours) at 01/21/2024 2007 Last data filed at 01/21/2024 2000 Gross per 24 hour  Intake 3566.08 ml  Output 3570 ml  Net -3.92 ml   CT 80-100 ml/hr, down to 50 ml last hour K 3.7 Creatinine 1.3 Hct 24, PLT 90K  Monitor CT output, seems to be trending down Correct metabolic acidosis Supplement Hillery Hunter C. Dorris Fetch, MD [facility] [phone]

## 2024-01-21 NOTE — Progress Notes (Signed)
     [address]       [place] [address]             [phone]       No events Vitals:   01/20/24 2000 01/20/24 2333  BP: 118/74 130/70  Pulse:    Resp: 13 20  Temp: 98.2 F (36.8 C) 97.9 F (36.6 C)  SpO2: 97% 95%   Alert NAD EWOB  OR today for CABG, LA ablation, and atriclip.  Audra Kagel Keane Scrape

## 2024-01-21 NOTE — Op Note (Signed)
 [address]       [place] [address]             [phone]                                          01/21/2024 Patient:  Darin Mcclure Pre-Op Dx: 3V CAD Atrial fibrillation DM HTN HLP NSTEMI   Post-op Dx:  same Procedure: CABG X 4.  LIMA LAD, RSVG PDA, OM, diagonal   Endoscopic greater saphenous vein harvest on the right and left 45mm Atriclip placement Left atrial MAZE with Atricure encompass clamp   Surgeon and Role:      * Lanyiah Brix, Eliezer Lofts, MD - Primary    * D.Zimmerman , PA-C - assisting An experienced assistant was required given the complexity of this surgery and the standard of surgical care. The assistant was needed for exposure, dissection, suctioning, retraction of delicate tissues and sutures, instrument exchange and for overall help during this procedure.    Anesthesia  general EBL:  Blood Administration: none Xclamp Time:  56 min Pump Time:   Drains: 108 F blake drain:  R, L, mediastinal  Wires: ventricular Counts: correct   Indications: Patient is a 83 year old male we are asked to see in cardiothoracic surgical consultation for consideration of coronary artery surgical revascularization. He has a known history of CAD status post drug-eluting stent to the RCA in 2007. Other cardiac risk factors include hypertension, hyperlipidemia and type 2 diabetes. He also has a known right bundle branch block. He presented to the emergency room on 218 with atrial fibrillation. He has recently had symptoms of a URI and did undergo a course of antibiotics as well as steroids which she completed on 01/13/2024. The day prior to presenting he had an episode of chest pain and shortness of breath while climbing the steps. The pain was midsternal. It did not radiate and improved with rest. On the date of presentation to the ED the symptoms became more persistent and he notes that he was feeling increasingly fatigued. He was found to be in  atrial fibrillation with intermittent episodes of RVR with heart rate up to the 150s. Cardiology consultation was obtained and he was admitted for further medical stabilization and diagnostic evaluation. He had 2 sets of troponin I's obtained both values were 94. BNP was elevated at 666.1. He was started on IV heparin. He has been maintained at home on Plavix and aspirin for his previous stent.  Findings: Back in sinus with brady cardia after 1st ablation.  Good vein and LIMA.  Good LAD, PDA and OM.  Small diagonal.  Operative Technique: All invasive lines were placed in pre-op holding.  After the risks, benefits and alternatives were thoroughly discussed, the patient was brought to the operative theatre.  Anesthesia was induced, and the patient was prepped and draped in normal sterile fashion.  An appropriate surgical pause was performed, and pre-operative antibiotics were dosed accordingly.  We began with simultaneous incisions along the right leg for harvesting of the greater saphenous vein and the chest for the sternotomy.  In regards to the sternotomy, this was carried down with bovie cautery, and the sternum was divided with a reciprocating saw.  Meticulous hemostasis was obtained.  The left internal thoracic artery was exposed and harvested in in pedicled fashion.  The patient was systemically  heparinized, and the artery was divided distally, and placed in a papaverine sponge.    The sternal elevator was removed, and a retractor was placed.  The pericardium was divided in the midline and fashioned into a cradle with pericardial stitches.   After we confirmed an appropriate ACT, the ascending aorta was cannulated in standard fashion.  The right atrial appendage was used for venous cannulation site.  Cardiopulmonary bypass was initiated, and the heart retractor was placed. The cross clamp was applied, and a dose of anterograde cardioplegia was given with good arrest of the heart.  We moved to the  posterior wall of the heart, and found a good target on the PDA.  An arteriotomy was made, and the vein graft was anastomosed to it in an end to side fashion.  Next we exposed the lateral wall, and found a good target on the OM.  An end to side anastomosis with the vein graft was then created.  Next, we exposed the anterior wall of the heart and identified a good target on Diagonal.   An arteriotomy was created.  The vein was anastomosed in an end to side fashion.  Finally, we exposed a good target on the LAD, and fashioned an end to side anastomosis between it and the LITA.  We began to re-warm, and a re-animation dose of cardioplegia was given.  The heart was de-aired, and the cross clamp was removed.  Meticulous hemostasis was obtained.    A partial occludding clamp was then placed on the ascending aorta, and we created an end to side anastomosis between it and the proximal vein grafts.  Rings were placed on the proximal anastomosis.  Hemostasis was obtained, and we separated from cardiopulmonary bypass without event.  The heparin was reversed with protamine.  Chest tubes and wires were placed, and the sternum was re-approximated with sternal wires.  The soft tissue and skin were re-approximated wth absorbable suture.    The patient tolerated the procedure without any immediate complications, and was transferred to the ICU in guarded condition.  Darin Mcclure Keane Scrape

## 2024-01-21 NOTE — Procedures (Signed)
 Extubation Procedure Note  Patient Details:   Name: Darin Mcclure DOB: 09/21/1941 MRN: [id number]   Airway Documentation:    Vent end date: 01/21/24 Vent end time: 1645   Evaluation  O2 sats: stable throughout Complications: No apparent complications Patient did tolerate procedure well. Bilateral Breath Sounds: Clear, Diminished   Yes  Pt achieved a NIF of - 32 and VC of 969 mL, with great pt effort on all attempts.   Pt extubated to 4L [place], pt tolerating well at this time. Cuff leak positive, no stridor noted, RN at bedside, CCM MD notified, RT will monitor as needed.     Thornell Mule 01/21/2024, 6:06 PM

## 2024-01-21 NOTE — Transfer of Care (Signed)
 Immediate Anesthesia Transfer of Care Note  Patient: Darin Mcclure  Procedure(s) Performed: CORONARY ARTERY BYPASS GRAFTING (CABG) TIMES FOUR USING LEFT INTERNAL MAMMARY ARTERY AND BILATERAL ENDOSCOPICALLY HARVESTED GREATER SAPHENOUS VEINS (Chest) CLIPPING OF ATRIAL APPENDAGE USING ATRICURE ATRICLIP PRO2 (Chest) TRANSESOPHAGEAL ECHOCARDIOGRAM (TEE) MAZE (Chest)  Patient Location: ICU  Anesthesia Type:General  Level of Consciousness: Patient remains intubated per anesthesia plan  Airway & Oxygen Therapy: Patient remains intubated per anesthesia plan and Patient placed on Ventilator (see vital sign flow sheet for setting)  Post-op Assessment: Report given to RN and Post -op Vital signs reviewed and stable  Post vital signs: Reviewed and stable  Last Vitals:  Vitals Value Taken Time  BP 117/80   Temp 37   Pulse 80   Resp 14    SpO2 100     Last Pain:  Vitals:   01/21/24 0640  TempSrc:   PainSc: 0-No pain      Patients Stated Pain Goal: 0 (01/19/24 2039)  Complications: No notable events documented.

## 2024-01-21 NOTE — Plan of Care (Signed)

## 2024-01-21 NOTE — Anesthesia Procedure Notes (Signed)
 Central Venous Catheter Insertion Performed by: Jairo Ben, MD, anesthesiologist Start/End2/25/2025 6:58 AM, 01/21/2024 7:21 AM Patient location: Pre-op. Preanesthetic checklist: patient identified, IV checked, risks and benefits discussed, surgical consent, monitors and equipment checked, pre-op evaluation, timeout performed and anesthesia consent Position: supine Lidocaine 1% used for infiltration and patient sedated Hand hygiene performed , maximum sterile barriers used  and Seldinger technique used Catheter size: 8.5 Fr Central line was placed.Sheath introducer Procedure performed using ultrasound guided technique. Ultrasound Notes:anatomy identified, needle tip was noted to be adjacent to the nerve/plexus identified, no ultrasound evidence of intravascular and/or intraneural injection and image(s) printed for medical record Attempts: 1 Following insertion, line sutured, dressing applied and Biopatch. Post procedure assessment: blood return through all ports, free fluid flow and no air  Patient tolerated the procedure well with no immediate complications. Additional procedure comments: CVP: Timeout, sterile prep, drape, FBP R neck.  Supine position.  1% lido local, finder and trocar RIJ 1st pass with US guidance. Cordis introducer placed over J wire, triple lumen inserted through introducer. Biopatch and sterile dressing on.  Patient tolerated well.  VSS.  Sandford Craze, MD.

## 2024-01-21 NOTE — Brief Op Note (Signed)
 01/14/2024 - 01/21/2024  12:22 PM  PATIENT:  Darin Mcclure  83 y.o. male  PRE-OPERATIVE DIAGNOSIS:  1. Coronary Artery Disease 2. Atrial fibrillation  POST-OPERATIVE DIAGNOSIS:  1. Coronary Artery Disease  2. Atrial fibrillation  PROCEDURE: TRANSESOPHAGEAL ECHOCARDIOGRAM (TEE), CORONARY ARTERY BYPASS GRAFTING (CABG) TIMES 4 (LIMA to LAD, SVG to DIAGONAL, SVG to OM, SVG to PDA) USING LEFT INTERNAL MAMMARY ARTERY AND BILATERAL ENDOSCOPICALLY HARVESTED GREATER SAPHENOUS VEINS,  CLIPPING OF ATRIAL APPENDAGE USING ATRICURE ATRICLIP PRO2, and MAZE via [facility] Clamp   Vein harvest time: 70 min Vein prep time: 24 min  SURGEON:  Surgeons and Role:    Corliss Skains, MD - Primary  PHYSICIAN ASSISTANT: Doree Fudge PA-C  ASSISTANTS: Tanda Rockers RNFA   ANESTHESIA:   general  EBL:  Per anesthesia, perfusion records  DRAINS:  Chest tubes placed in the mediastinal and pleural spaces    COUNTS CORRECT:  YES  DICTATION: .Dragon Dictation  PLAN OF CARE: Admit to inpatient   PATIENT DISPOSITION:  ICU - intubated and hemodynamically stable.   Delay start of Pharmacological VTE agent (>24hrs) due to surgical blood loss or risk of bleeding: no  BASELINE WEIGHT: 101.9 kg

## 2024-01-21 NOTE — Discharge Summary (Signed)
 [address]       [place] [address]             [phone]    Physician Discharge Summary  Patient ID: Harim Bi MRN: [id number] DOB/AGE: 1941-01-26 83 y.o.  Admit date: 01/14/2024 Discharge date: 01/26/2024  Admission Diagnoses:  Patient Active Problem List   Diagnosis Date Noted   Coronary artery disease 01/21/2024   New onset atrial fibrillation (HCC) 01/14/2024   Influenza A 01/14/2024   Type 2 diabetes mellitus (HCC) 01/14/2024   Hypertension associated with diabetes (HCC) 01/14/2024   Chronic kidney disease, stage 3a (HCC) 01/14/2024   Hypothyroidism 01/14/2024   Hyperlipidemia associated with type 2 diabetes mellitus (HCC) 01/14/2024   Coronary artery disease involving native coronary artery of native heart without angina pectoris 12/29/2021   Pure hypercholesterolemia 12/29/2021   Right bundle branch block 12/29/2021   Carotid artery disease (HCC) 12/29/2021   Gastroesophageal reflux disease without esophagitis 04/19/2021   Impingement syndrome of left shoulder region 05/17/2020   History of prosthetic unicompartmental arthroplasty of left knee 11/18/2019   Pain in left knee 09/24/2018   Pain of left hip joint 09/24/2018   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) 08/26/2017   Muscle cramp 08/26/2017   Discharge Diagnoses:  Patient Active Problem List   Diagnosis Date Noted   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   Testing of male for genetic disease carrier status 01/26/2024   History of left atrial appendage closure 01/26/2024   Coronary artery disease 01/21/2024   New onset atrial fibrillation (HCC) 01/14/2024   Influenza A 01/14/2024   Type 2 diabetes mellitus (HCC) 01/14/2024   Hypertension associated with diabetes (HCC) 01/14/2024   Chronic kidney disease, stage 3a (HCC) 01/14/2024   Hypothyroidism 01/14/2024   Hyperlipidemia associated with type 2 diabetes mellitus (HCC) 01/14/2024    Coronary artery disease involving native coronary artery of native heart without angina pectoris 12/29/2021   Pure hypercholesterolemia 12/29/2021   Right bundle branch block 12/29/2021   Carotid artery disease (HCC) 12/29/2021   Gastroesophageal reflux disease without esophagitis 04/19/2021   Impingement syndrome of left shoulder region 05/17/2020   History of prosthetic unicompartmental arthroplasty of left knee 11/18/2019   Pain in left knee 09/24/2018   Pain of left hip joint 09/24/2018   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 He has a known history of CAD status post drug-eluting stent to the RCA in 2007. Other cardiac risk factors include hypertension, hyperlipidemia and type 2 diabetes. He also has a known right bundle branch block. He presented to the emergency room on 218 with atrial fibrillation. He has recently had symptoms of a URI and did undergo a course of antibiotics as well as steroids which she completed on 01/13/2024. The day prior to presenting he had an episode of chest pain and shortness of breath while climbing the steps. The pain was mid sternal and it did not radiate and improved with rest. On the date of presentation to the ED the symptoms became more persistent and he notes that he was feeling increasingly fatigued. He was found to be in atrial fibrillation with intermittent episodes of RVR with heart rate up to the 150s. Cardiology consultation  was obtained and he was admitted for further medical stabilization and diagnostic evaluation. He had 2 sets of troponin I's obtained both values were 94. BNP was elevated at 666.1. He was started on IV heparin. He has been maintained at home on Plavix and  aspirin for his previous stent. He has subsequently undergone cardiac catheterization and echocardiogram and the full results are as described below. He has severe three-vessel disease with good LV function and good RV function. 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 A cardiothoracic consultation was obtained for the consideration of coronary artery bypass grafting surgery. Potential risks, benefits, and complications of the surgery were discussed with the patient and he agreed to proceed with surgery. Pre operative carotid duplex US showed no significant internal carotid artery stenosis bilaterally.   Hospital Course: Patient underwent a CABG x 4, MAZE, LA clip. Patient was transported from the OR to [facility] ICU in stable condition. He was extubated the evening of surgery. He was on Amiodarone and Neo Synephrine drips. A line, chest tubes, and foley were removed early in his post operative course. He was initially paced at 70 (underlying bradycardia). He had dizziness initially, but this did improve. He was transitioned off the Insulin drip. His pre op HGA1C was 7.1. He will be restarted on Januvia and Metformin closer to discharge. He had expected post op blood loss anemia and did not require a post op transfusion. Last H and H was 8.0/23.9.  He also had mild thrombocytopenia. His last platelet count was 91K. His creatinine increased to 1.81 post op. Prior to admission, his creatinine was 1.48. His creatinine did gradually decrease and was down to 1.59 on 03/01. Epicardial pacing wires were removed on 02/27. He was restarted on Apixaban for history of atrial fibrillation. He was stable for transfer from the ICU to 4E for further convalescence on 02/28.  The patient has done well.  He remains in NSR.  He is ambulating without difficulty.  His surgical incisions are healing without evidence of infection.  He is medically stable for discharge home today.  Consults: pulmonary/intensive  care  Significant Diagnostic Studies:   Narrative & Impression  CLINICAL DATA:  Pneumothorax.   EXAM: PORTABLE CHEST 1 VIEW   COMPARISON:  January 21, 2024.   FINDINGS: Stable cardiomediastinal silhouette. Sternotomy wires are noted. Right internal jugular catheter is unchanged. Small right apical pneumothorax is noted which is enlarged compared to prior exam. Bibasilar subsegmental atelectasis is noted with small left pleural effusion.   IMPRESSION: Small right apical pneumothorax is noted which is enlarged compared to prior exam. Mild bibasilar subsegmental atelectasis with small left pleural effusion.     Electronically Signed   By: Lupita Raider M.D.   On: 01/22/2024 10:02      Treatments: surgery:  CABG X 4.  LIMA LAD, RSVG PDA, OM, diagonal   Endoscopic greater saphenous vein harvest on the right and left 45mm Atriclip placement Left atrial MAZE with Atricure encompass clamp by Dr. Cliffton Asters on 01/21/2024.  Discharge Exam: Blood pressure (!) 115/51, pulse 72, temperature 98.1 F (36.7 C), temperature source Oral, resp. rate 20, height 6' (1.829 m), weight 109.5 kg, SpO2 97%.  General appearance: alert, cooperative, and no distress Heart: regular rate and rhythm Lungs: clear to auscultation bilaterally Abdomen: soft, non-tender; bowel sounds normal; no masses,  no organomegaly Extremities: extremities normal, atraumatic, no cyanosis or edema Wound: clean and dry   Discharge Medications:  The patient has been discharged on:  1.Beta Blocker:  Yes [ x  ]                              No   [   ]                              If No, reason:  2.Ace Inhibitor/ARB: Yes [   ]                                     No  [  X  ]                                     If No, reason:elevated creatinine  3.Statin:   Yes [x   ]                  No  [   ]                  If No, reason:  4.Ecasa:  Yes  [  x ]                  No   [   ]                  If No,  reason:  Patient had ACS upon admission: no  Plavix/P2Y12 inhibitor: Yes [   ]                                      No  [  X ]   Discharge Instructions     Amb Referral to Cardiac Rehabilitation   Complete by: As directed    Diagnosis: CABG   CABG X ___: 4   After initial evaluation and assessments completed: Virtual Based Care may be provided alone or in conjunction with Phase 2 Cardiac Rehab based on patient barriers.: Yes   Intensive Cardiac Rehabilitation (ICR) [facility] location only OR Traditional Cardiac Rehabilitation (TCR) *If criteria for ICR are not met will enroll in TCR [facility] only): Yes      Allergies as of 01/26/2024   No Known Allergies      Medication List     STOP taking these medications    clopidogrel 75 MG tablet Commonly known as: PLAVIX   losartan 100 MG tablet Commonly known as: COZAAR   meloxicam 15 MG tablet Commonly known as: MOBIC       TAKE these medications    acetaminophen 500 MG tablet Commonly known as: TYLENOL Take 1-2 tablets (500-1,000 mg total) by mouth every 6 (six) hours as needed.   amiodarone 200 MG tablet Commonly known as: PACERONE Take 1 tablet (200 mg total) by mouth daily.   amoxicillin 500 MG capsule Commonly known as: AMOXIL As needed for dental procedure   apixaban 2.5 MG Tabs tablet Commonly known as: ELIQUIS Take 1 tablet (2.5 mg total) by mouth 2 (two) times daily.   aspirin EC 81 MG tablet Take 81 mg by mouth daily.   atorvastatin 40 MG tablet Commonly known as: LIPITOR Take 1 tablet (40 mg total) by mouth at  bedtime. What changed:  medication strength how much to take when to take this   carisoprodol 250 MG tablet Commonly known as: SOMA Take 250 mg by mouth at bedtime as needed (muscle spasms).   diphenhydrAMINE 25 mg capsule Commonly known as: BENADRYL Take 1 capsule (25 mg total) by mouth at bedtime as needed for sleep.   famotidine 20 MG tablet Commonly known as: PEPCID Take 1 tablet (20 mg  total) by mouth daily.   fluticasone 50 MCG/ACT nasal spray Commonly known as: FLONASE Place 1 spray into both nostrils daily as needed.   furosemide 40 MG tablet Commonly known as: LASIX Take 1 tablet (40 mg total) by mouth daily.   Januvia 100 MG tablet Generic drug: sitaGLIPtin Take 100 mg by mouth daily.   levothyroxine 75 MCG tablet Commonly known as: SYNTHROID Take 75 mcg by mouth daily.   Magnesium 500 MG Caps Take 1,000 mg by mouth daily.   metFORMIN 1000 MG tablet Commonly known as: GLUCOPHAGE Take 1 tablet by mouth 2 (two) times daily.   methylcellulose oral powder Take 1 packet by mouth daily.   metoprolol succinate 25 MG 24 hr tablet Commonly known as: Toprol XL Take 1 tablet (25 mg total) by mouth daily. What changed:  medication strength how much to take when to take this   multivitamin tablet Take 1 tablet by mouth daily.   NONFORMULARY OR COMPOUNDED ITEM Topical antifungal topical solution: terbinafine 3%, fluconazole 2%, tea tree oil 5%, Urea 10%, ibuprofen2% in DMSO suspension. Sig: Apply to the affected toenail(s) once daily.  Order sent to [facility] [address] [place], [place] [address]   OMEGA-3 FISH OIL PO Take 4,000 mg by mouth at bedtime.   ONE TOUCH ULTRA TEST test strip Generic drug: glucose blood USE 1 TO 2 TIMES A DAY   potassium chloride 10 MEQ tablet Commonly known as: KLOR-CON M Take 1 tablet (10 mEq total) by mouth daily.   PROBIOTIC DAILY PO Take 1 tablet by mouth daily.   traMADol 50 MG tablet Commonly known as: ULTRAM Take 1 tablet (50 mg total) by mouth every 6 (six) hours as needed for moderate pain (pain score 4-6).        Follow-up Information     Lightfoot, Eliezer Lofts, MD Follow up.   Specialty: Cardiothoracic Surgery Why: Appointment is VIRTUAL;please do NOT go to the office. Dr. Cliffton Asters will call you on 02/07/2024 at 2:20 pm Contact information: [address] [place]  [place] [address] [phone]         Levi Aland, NP. Go on 02/10/2024.   Specialty: Cardiology Why: Appointment time is at 8:25 am Contact information: [address] [place] [address] [place] [address] [phone]                 Signed:  Lowella Dandy, PA-C 01/26/2024, 9:32 AM

## 2024-01-21 NOTE — Anesthesia Procedure Notes (Signed)
 Procedure Name: Intubation Date/Time: 01/21/2024 8:23 AM  Performed by: Alwyn Ren, CRNAPre-anesthesia Checklist: Patient identified, Emergency Drugs available, Suction available and Patient being monitored Patient Re-evaluated:Patient Re-evaluated prior to induction Oxygen Delivery Method: Circle system utilized Preoxygenation: Pre-oxygenation with 100% oxygen Induction Type: IV induction Ventilation: Mask ventilation without difficulty Laryngoscope Size: Miller and 2 Grade View: Grade II Tube type: Oral Tube size: 8.0 mm Number of attempts: 1 Airway Equipment and Method: Stylet and Oral airway Placement Confirmation: ETT inserted through vocal cords under direct vision, positive ETCO2 and breath sounds checked- equal and bilateral Secured at: 23 cm Tube secured with: Tape Dental Injury: Teeth and Oropharynx as per pre-operative assessment

## 2024-01-21 NOTE — Consult Note (Signed)
 NAME:  Darin Mcclure, MRN:  [id number], DOB:  02/21/1941, LOS: 6 ADMISSION DATE:  01/14/2024, CONSULTATION DATE:  2/25 REFERRING MD:  Cliffton Asters, CHIEF COMPLAINT:  post--CABG   History of Present Illness:  Darin Mcclure is an 83 y/o gentleman with a history of HTN, HLD, CAD, DM who presented on 2/18 with CP & DOE walking up the steps that persisted for about 1 day, eventually also developing fatigue. He had previously been treated for a URI with steroids and antibiotics. He was noted to be in Afib at presentation and had influenza A, which has since resolved.  LHC demonstrated diffuse disease in mid-LCX, mid-LAD, second diag, and proximal RCA. Echo with LVEF 65-70%, no RWMA.  He was on aspirin and plavix at baseline since previous stent in 2007. He underwent CABG x 4 and MAZE. Post-MAZE Afib was resolved and he required pacing for native HR in 20-30s.  He had 600 EBL, 300 cell saver.  Pump time 121 min, cross clamp 56 min. He was transferred to the ICU on MV and neosynephrine with V-pacing. Albuterol given in OR for low saturations.   Pertinent  Medical History  CAD DM Afib RBBB HLD HTN GERD CKD3a  Significant Hospital Events: Including procedures, antibiotic start and stop dates in addition to other pertinent events   2/18 admitted 2/25 CABGx4, MAZE  Interim History / Subjective:    Objective   Blood pressure 130/70, pulse 81, temperature 97.9 F (36.6 C), temperature source Oral, resp. rate 11, height 6' (1.829 m), weight 101.9 kg, SpO2 100%.        Intake/Output Summary (Last 24 hours) at 01/21/2024 1409 Last data filed at 01/21/2024 1318 Gross per 24 hour  Intake 3384.89 ml  Output 2275 ml  Net 1109.89 ml   Filed Weights   01/19/24 0500 01/20/24 0624 01/21/24 0543  Weight: 101.6 kg 101.7 kg 101.9 kg    Examination: General: critically ill appearing man lying in bed in NAD, intubated, sedated HENT: [place]/AT, eyes anicteric Lungs: breathing comfortably on MV, synchronous  with MV Cardiovascular: S1S2, RRR, V-paced. Mild bloody output from chest tubes. Abdomen: abd soft, NT Extremities: no edema, RLE compressive dressing. 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 A1c 7.1 -insulin gtt per protocol -hold PTA Januvia, mteformin  ABLA- expected post-op Consumptive thrombocytopenia- expected post-op Consumptive coagulopathy- expected post-op  H/o HTN -hold PTA imdur, metoprolol succinate, and losartan  CKD 3a -maintain adequate perfusion -strict I/O -renally dose meds, avoid nephrotoxic meds  Hypothyroidism -con't synthroid  GERD -con't PTA pepcid   Best Practice (right click and "Reselect all SmartList Selections" daily)   Diet/type: NPO DVT prophylaxis SCD Pressure ulcer(s): N/A GI prophylaxis: H2B and PPI Lines: Central line and Arterial Line Foley:  Yes, and it is still needed Code Status:  full code Last date of multidisciplinary goals of care discussion [ ]   Labs   CBC: Recent Labs  Lab 01/16/24 0923 01/18/24 0204 01/19/24 0208 01/20/24 0207 01/21/24 0456 01/21/24 0826 01/21/24 1042 01/21/24 1137 01/21/24 1213 01/21/24 1225 01/21/24 1250  WBC 10.0 6.6 6.2 6.8 6.5  --   --   --   --   --   --   HGB 16.2 12.7* 13.4 13.7 13.3   < > 8.8* 7.8* 7.6* 7.5* 8.2*   HCT 46.7 35.9* 39.2 40.2 38.7*   < > 26.0* 23.0* 22.6* 22.0* 24.0*  MCV 89.0 88.9 89.5 90.3 90.0  --   --   --   --   --   --   PLT 224 174 149* 144* 127*  --   --   --  107*  --   --    < > = values in this interval not displayed.    Basic Metabolic Panel: Recent Labs  Lab 01/15/24 0447 01/16/24 1610 01/17/24 0205 01/18/24 0723 01/19/24 0207 01/20/24 0207 01/21/24 0456 01/21/24 0826 01/21/24 0830 01/21/24 1016 01/21/24 1037 01/21/24 1042 01/21/24 1137 01/21/24 1225 01/21/24 1250  NA 135 139 137 139 138 137 138   < > 139 139 139 139 136 138 138  K 4.4 4.2 4.0 4.1 3.7 4.3 3.9   < > 3.9 3.9 4.5 4.5 4.8 5.0 4.3  CL 102 102 100 101 103 104 106  --  104 106  --  103 103 102  --   CO2 21* 23 27 26 23 22  21*  --   --   --   --   --   --   --   --   GLUCOSE 154* 181* 156* 178* 152* 149* 160*  --  146* 129*  --  108* 136* 160*  --   BUN 28* 30* 34* 23 21 21 21   --  19 18  --  16 16 19   --   CREATININE 1.23 1.55* 1.74* 1.47* 1.38* 1.58* 1.37*  --  1.30* 1.30*  --  1.20 1.00 1.10  --   CALCIUM 9.2 9.5 8.9 9.3 9.2 9.3 9.1  --   --   --   --   --   --   --   --   MG 1.8 1.8  --   --  1.8  --   --   --   --   --   --   --   --   --   --    < > = values in this interval not displayed.   GFR: Estimated Creatinine Clearance: 65 mL/min (by C-G formula based on SCr of 1.1 mg/dL). Recent Labs  Lab 01/15/24 1012 01/16/24 0923 01/18/24 0204 01/19/24 0208 01/20/24 0207 01/21/24 0456  PROCALCITON <0.10  --   --   --   --   --   WBC  --    < > 6.6 6.2 6.8 6.5   < > = values in this interval not displayed.    Liver Function Tests: Recent Labs  Lab 01/14/24 1817 01/15/24 0447 01/16/24 0923 01/18/24 0723 01/21/24 0456  AST 84* 43* 25 25 22   ALT 78* 60* 49* 35 25  ALKPHOS 49 39 44 40 34*  BILITOT 0.8 0.9 1.1 1.2 1.1  PROT 7.0 6.2* 6.9 6.9 6.3*  ALBUMIN 3.7 3.3* 3.7 3.7 3.3*   No results for input(s): "LIPASE", "AMYLASE" in the last 168 hours. No results for input(s): "AMMONIA"  in the last 168 hours.  ABG  Component Value Date/Time   PHART 7.302 (L) 01/21/2024 1250   PCO2ART 41.2 01/21/2024 1250   PO2ART 82 (L) 01/21/2024 1250   HCO3 20.4 01/21/2024 1250   TCO2 22 01/21/2024 1250   ACIDBASEDEF 6.0 (H) 01/21/2024 1250   O2SAT 95 01/21/2024 1250     Coagulation Profile: Recent Labs  Lab 01/14/24 2233  INR 1.0    Cardiac Enzymes: No results for input(s): "CKTOTAL", "CKMB", "CKMBINDEX", "TROPONINI" in the last 168 hours.  HbA1C: Hgb A1c MFr Bld  Date/Time Value Ref Range Status  01/14/2024 10:33 PM 7.1 (H) 4.8 - 5.6 % Final    Comment:    (NOTE) Pre diabetes:          5.7%-6.4%  Diabetes:              >6.4%  Glycemic control for   <7.0% adults with diabetes   12/17/2018 12:21 PM 6.9 (H) 4.6 - 6.5 % Final    Comment:    Glycemic Control Guidelines for People with Diabetes:Non Diabetic:  <6%Goal of Therapy: <7%Additional Action Suggested:  >8%     CBG: Recent Labs  Lab 01/20/24 1115 01/20/24 1506 01/20/24 2227 01/21/24 0618 01/21/24 1401  GLUCAP 165* 185* 178* 149* 156*    Review of Systems:   Unable to be obtained due to mental status.   Past Medical History:  He,  has a past medical history of Allergy, Blood transfusion without reported diagnosis, Coronary artery disease, 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) (08/26/2017), GERD (gastroesophageal reflux disease), Heart disease, Hyperglycemia, Hypertension, Mixed hyperlipidemia, Muscle cramp (08/26/2017), Neuropathy, Osteoarthritis, RBBB, RBBB (right bundle branch block), and Type 2 diabetes mellitus (HCC).   Surgical History:   Past Surgical History:  Procedure Laterality Date   ACHILLES TENDON REPAIR Right    BACK SURGERY     CORONARY ANGIOPLASTY     pror stent to mid RCA 10/04/2006   FOOT SURGERY     Cyst removal   INGUINAL HERNIA REPAIR     LEFT HEART CATH AND CORONARY ANGIOGRAPHY N/A 01/15/2024   Procedure: LEFT HEART  CATH AND CORONARY ANGIOGRAPHY;  Surgeon: Kathleene Hazel, MD;  Location: [facility];  Service: Cardiovascular;  Laterality: N/A;   PERCUTANEOUS CORONARY STENT INTERVENTION (PCI-S)  09/2006   REPLACEMENT TOTAL KNEE Left    TOTAL HIP ARTHROPLASTY Right    TOTAL HIP ARTHROPLASTY Left      Social History:   reports that he has never smoked. He has never used smokeless tobacco. He reports current alcohol use. He reports that he does not use drugs.   Family History:  His family history includes CAD in his father; Lung cancer in his father. There is no history of Colon cancer, Esophageal cancer, Rectal cancer, or Stomach cancer.   Allergies No Known Allergies   Home Medications  Prior to Admission medications   Medication Sig Start Date End Date Taking? Authorizing Provider  aspirin EC 81 MG tablet Take 81 mg by mouth daily.   Yes [provider]  atorvastatin (LIPITOR) 10 MG tablet Take 1 tablet (10 mg total) by mouth daily. 11/29/23  Yes Sharlene Dory, PA-C  carisoprodol (SOMA) 250 MG tablet Take 250 mg by mouth at bedtime as needed (muscle spasms). 04/19/21  Yes [provider]  clopidogrel (PLAVIX) 75 MG tablet Take 1 tablet (75 mg total) by mouth daily. 11/29/23  Yes Conte, Tessa N, PA-C  famotidine (PEPCID) 20 MG tablet Take 1 tablet (  20 mg total) by mouth daily. 02/05/22  Yes Arnaldo Natal, NP  fluticasone (FLONASE) 50 MCG/ACT nasal spray Place 1 spray into both nostrils daily as needed.  08/08/17  Yes [provider]  JANUVIA 100 MG tablet Take 100 mg by mouth daily. 08/19/21  Yes [provider]  levothyroxine (SYNTHROID) 75 MCG tablet Take 75 mcg by mouth daily. 04/12/20  Yes [provider]  losartan (COZAAR) 100 MG tablet Take 1 tablet (100 mg total) by mouth daily. 11/29/23  Yes Sharlene Dory, PA-C  Magnesium 500 MG CAPS Take 1,000 mg by mouth daily.   Yes [provider]  metFORMIN (GLUCOPHAGE) 1000 MG tablet  Take 1 tablet by mouth 2 (two) times daily. 04/30/22  Yes [provider]  methylcellulose oral powder Take 1 packet by mouth daily.   Yes [provider]  metoprolol succinate (TOPROL-XL) 50 MG 24 hr tablet Take 1 tablet (50 mg total) by mouth 2 (two) times daily. 11/29/23  Yes Conte, Tessa N, PA-C  Multiple Vitamin (MULTIVITAMIN) tablet Take 1 tablet by mouth daily.   Yes [provider]  Probiotic Product (PROBIOTIC DAILY PO) Take 1 tablet by mouth daily.   Yes [provider]  amoxicillin (AMOXIL) 500 MG capsule As needed for dental procedure Patient not taking: Reported on 01/14/2024 03/27/19   [provider]  meloxicam (MOBIC) 15 MG tablet Take 1 tablet (15 mg total) by mouth daily. Patient not taking: Reported on 01/14/2024 11/28/20   Felecia Shelling, DPM  NONFORMULARY OR COMPOUNDED ITEM Topical antifungal topical solution: terbinafine 3%, fluconazole 2%, tea tree oil 5%, Urea 10%, ibuprofen2% in DMSO suspension. Sig: Apply to the affected toenail(s) once daily.  Order sent to [facility] [address] [place], [place] [address] 01/16/24   [name], DPM  Omega-3 Fatty Acids (OMEGA-3 FISH OIL PO) Take 4,000 mg by mouth at bedtime.    [provider]  ONE TOUCH ULTRA TEST test strip USE 1 TO 2 TIMES A DAY 08/29/18   [provider]     Critical care time: 40 min.     Steffanie Dunn, DO 01/21/24 2:09 PM [facility]  For contact information, see Amion. If no response to pager, please call PCCM consult pager. After hours, 7PM- 7AM, please call Elink.

## 2024-01-21 NOTE — Progress Notes (Signed)
 Echocardiogram Echocardiogram Transesophageal has been performed.  Darin Mcclure 01/21/2024, 6:31 PM

## 2024-01-22 ENCOUNTER — Encounter (HOSPITAL_COMMUNITY): Payer: Self-pay | Admitting: Thoracic Surgery (Cardiothoracic Vascular Surgery)

## 2024-01-22 ENCOUNTER — Inpatient Hospital Stay (HOSPITAL_COMMUNITY): Payer: Medicare Other

## 2024-01-22 DIAGNOSIS — R42 Dizziness and giddiness: Secondary | ICD-10-CM | POA: Diagnosis not present

## 2024-01-22 DIAGNOSIS — Z951 Presence of aortocoronary bypass graft: Secondary | ICD-10-CM | POA: Diagnosis not present

## 2024-01-22 DIAGNOSIS — N179 Acute kidney failure, unspecified: Secondary | ICD-10-CM

## 2024-01-22 DIAGNOSIS — I4891 Unspecified atrial fibrillation: Secondary | ICD-10-CM | POA: Diagnosis not present

## 2024-01-22 DIAGNOSIS — D62 Acute posthemorrhagic anemia: Secondary | ICD-10-CM | POA: Diagnosis not present

## 2024-01-22 LAB — CBC
HCT: 27 % — ABNORMAL LOW (ref 39.0–52.0)
HCT: 27.3 % — ABNORMAL LOW (ref 39.0–52.0)
Hemoglobin: 9.1 g/dL — ABNORMAL LOW (ref 13.0–17.0)
Hemoglobin: 9 g/dL — ABNORMAL LOW (ref 13.0–17.0)
MCH: 31.2 pg (ref 26.0–34.0)
MCH: 31.1 pg (ref 26.0–34.0)
MCHC: 33.7 g/dL (ref 30.0–36.0)
MCHC: 33 g/dL (ref 30.0–36.0)
MCV: 92.5 fL (ref 80.0–100.0)
MCV: 94.5 fL (ref 80.0–100.0)
Platelets: 89 10*3/uL — ABNORMAL LOW (ref 150–400)
Platelets: 100 10*3/uL — ABNORMAL LOW (ref 150–400)
RBC: 2.92 MIL/uL — ABNORMAL LOW (ref 4.22–5.81)
RBC: 2.89 MIL/uL — ABNORMAL LOW (ref 4.22–5.81)
RDW: 13.7 % (ref 11.5–15.5)
RDW: 14.3 % (ref 11.5–15.5)
WBC: 11 10*3/uL — ABNORMAL HIGH (ref 4.0–10.5)
WBC: 14 10*3/uL — ABNORMAL HIGH (ref 4.0–10.5)
nRBC: 0 % (ref 0.0–0.2)
nRBC: 0 % (ref 0.0–0.2)

## 2024-01-22 LAB — GLUCOSE, CAPILLARY
Glucose-Capillary: 114 mg/dL — ABNORMAL HIGH (ref 70–99)
Glucose-Capillary: 129 mg/dL — ABNORMAL HIGH (ref 70–99)
Glucose-Capillary: 135 mg/dL — ABNORMAL HIGH (ref 70–99)
Glucose-Capillary: 137 mg/dL — ABNORMAL HIGH (ref 70–99)
Glucose-Capillary: 143 mg/dL — ABNORMAL HIGH (ref 70–99)
Glucose-Capillary: 143 mg/dL — ABNORMAL HIGH (ref 70–99)
Glucose-Capillary: 145 mg/dL — ABNORMAL HIGH (ref 70–99)
Glucose-Capillary: 146 mg/dL — ABNORMAL HIGH (ref 70–99)
Glucose-Capillary: 179 mg/dL — ABNORMAL HIGH (ref 70–99)
Glucose-Capillary: 185 mg/dL — ABNORMAL HIGH (ref 70–99)

## 2024-01-22 LAB — BASIC METABOLIC PANEL
Anion gap: 11 (ref 5–15)
Anion gap: 12 (ref 5–15)
BUN: 18 mg/dL (ref 8–23)
BUN: 20 mg/dL (ref 8–23)
CO2: 21 mmol/L — ABNORMAL LOW (ref 22–32)
CO2: 21 mmol/L — ABNORMAL LOW (ref 22–32)
Calcium: 7.8 mg/dL — ABNORMAL LOW (ref 8.9–10.3)
Calcium: 7.9 mg/dL — ABNORMAL LOW (ref 8.9–10.3)
Chloride: 106 mmol/L (ref 98–111)
Chloride: 104 mmol/L (ref 98–111)
Creatinine, Ser: 1.44 mg/dL — ABNORMAL HIGH (ref 0.61–1.24)
Creatinine, Ser: 1.81 mg/dL — ABNORMAL HIGH (ref 0.61–1.24)
GFR, Estimated: 49 mL/min — ABNORMAL LOW (ref >60)
GFR, Estimated: 37 mL/min — ABNORMAL LOW (ref >60)
Glucose, Bld: 136 mg/dL — ABNORMAL HIGH (ref 70–99)
Glucose, Bld: 208 mg/dL — ABNORMAL HIGH (ref 70–99)
Potassium: 4.1 mmol/L (ref 3.5–5.1)
Potassium: 4.1 mmol/L (ref 3.5–5.1)
Sodium: 138 mmol/L (ref 135–145)
Sodium: 137 mmol/L (ref 135–145)

## 2024-01-22 LAB — MAGNESIUM
Magnesium: 2.7 mg/dL — ABNORMAL HIGH (ref 1.7–2.4)
Magnesium: 2.7 mg/dL — ABNORMAL HIGH (ref 1.7–2.4)

## 2024-01-22 MED ORDER — INSULIN ASPART 100 UNIT/ML IJ SOLN: 2.0000 [IU] | INTRAMUSCULAR | Status: DC

## 2024-01-22 MED ORDER — IPRATROPIUM-ALBUTEROL 0.5-2.5 (3) MG/3ML IN SOLN
3.0000 mL | RESPIRATORY_TRACT | Status: DC
  Administered 2024-01-22 – 2024-01-24 (×10): 3 mL via RESPIRATORY_TRACT
  Filled 2024-01-22 (×10): qty 3

## 2024-01-22 MED ORDER — AMIODARONE HCL 200 MG PO TABS
200.0000 mg | ORAL_TABLET | Freq: Every day | ORAL | Status: DC
  Administered 2024-01-22 – 2024-01-26 (×5): 200 mg via ORAL
  Filled 2024-01-22 (×5): qty 1

## 2024-01-22 MED ORDER — INSULIN GLARGINE-YFGN 100 UNIT/ML ~~LOC~~ SOLN
5.0000 [IU] | Freq: Two times a day (BID) | SUBCUTANEOUS | Status: DC
  Filled 2024-01-22 (×2): qty 0.05

## 2024-01-22 MED ORDER — INSULIN ASPART 100 UNIT/ML IJ SOLN
0.0000 [IU] | INTRAMUSCULAR | Status: DC
  Administered 2024-01-22: 4 [IU] via SUBCUTANEOUS
  Administered 2024-01-22: 2 [IU] via SUBCUTANEOUS
  Administered 2024-01-22: 4 [IU] via SUBCUTANEOUS
  Administered 2024-01-23: 6 [IU] via SUBCUTANEOUS
  Administered 2024-01-23: 12 [IU] via SUBCUTANEOUS
  Administered 2024-01-23: 8 [IU] via SUBCUTANEOUS
  Administered 2024-01-23: 4 [IU] via SUBCUTANEOUS
  Administered 2024-01-23: 8 [IU] via SUBCUTANEOUS
  Administered 2024-01-24: 2 [IU] via SUBCUTANEOUS
  Administered 2024-01-24: 4 [IU] via SUBCUTANEOUS
  Administered 2024-01-24 (×2): 2 [IU] via SUBCUTANEOUS

## 2024-01-22 MED ORDER — INSULIN GLARGINE 100 UNIT/ML ~~LOC~~ SOLN
5.0000 [IU] | Freq: Two times a day (BID) | SUBCUTANEOUS | Status: DC
  Administered 2024-01-22 – 2024-01-26 (×9): 5 [IU] via SUBCUTANEOUS
  Filled 2024-01-22 (×10): qty 0.05

## 2024-01-22 MED FILL — Electrolyte-R (PH 7.4) Solution: INTRAVENOUS | Qty: 7000 | Status: AC

## 2024-01-22 MED FILL — Sodium Chloride IV Soln 0.9%: INTRAVENOUS | Qty: 2000 | Status: AC

## 2024-01-22 MED FILL — Potassium Chloride Inj 2 mEq/ML: INTRAVENOUS | Qty: 40 | Status: AC

## 2024-01-22 MED FILL — Heparin Sodium (Porcine) Inj 1000 Unit/ML: INTRAMUSCULAR | Qty: 20 | Status: AC

## 2024-01-22 MED FILL — Heparin Sodium (Porcine) Inj 1000 Unit/ML: INTRAMUSCULAR | Qty: 30 | Status: AC

## 2024-01-22 MED FILL — Sodium Bicarbonate IV Soln 8.4%: INTRAVENOUS | Qty: 50 | Status: AC

## 2024-01-22 MED FILL — Calcium Chloride Inj 10%: INTRAVENOUS | Qty: 10 | Status: AC

## 2024-01-22 MED FILL — Lidocaine HCl Local Preservative Free (PF) Inj 2%: INTRAMUSCULAR | Qty: 14 | Status: AC

## 2024-01-22 MED FILL — Lidocaine HCl Local Soln Prefilled Syringe 100 MG/5ML (2%): INTRAMUSCULAR | Qty: 5 | Status: AC

## 2024-01-22 MED FILL — Heparin Sodium (Porcine) Inj 1000 Unit/ML: Qty: 1000 | Status: AC

## 2024-01-22 MED FILL — Mannitol IV Soln 20%: INTRAVENOUS | Qty: 500 | Status: AC

## 2024-01-22 NOTE — Plan of Care (Signed)
  Problem: Fluid Volume: Goal: Ability to maintain a balanced intake and output will improve Outcome: Progressing   Problem: Health Behavior/Discharge Planning: Goal: Ability to identify and utilize available resources and services will improve Outcome: Progressing Goal: Ability to manage health-related needs will improve Outcome: Progressing   Problem: Skin Integrity: Goal: Risk for impaired skin integrity will decrease Outcome: Progressing   Problem: Tissue Perfusion: Goal: Adequacy of tissue perfusion will improve Outcome: Progressing   Problem: Education: Goal: Knowledge of disease or condition will improve Outcome: Progressing   Problem: Activity: Goal: Ability to tolerate increased activity will improve Outcome: Progressing

## 2024-01-22 NOTE — Anesthesia Postprocedure Evaluation (Signed)
 Anesthesia Post Note  Patient: Darin Mcclure  Procedure(s) Performed: CORONARY ARTERY BYPASS GRAFTING (CABG) TIMES FOUR USING LEFT INTERNAL MAMMARY ARTERY AND BILATERAL ENDOSCOPICALLY HARVESTED GREATER SAPHENOUS VEINS (Chest) CLIPPING OF ATRIAL APPENDAGE USING ATRICURE ATRICLIP PRO2 (Chest) TRANSESOPHAGEAL ECHOCARDIOGRAM (TEE) MAZE (Chest)     Patient location during evaluation: SICU Anesthesia Type: General Level of consciousness: awake and alert, patient cooperative and oriented Pain management: pain level controlled Vital Signs Assessment: post-procedure vital signs reviewed and stable Respiratory status: nonlabored ventilation, spontaneous breathing, respiratory function stable and patient connected to nasal cannula oxygen Cardiovascular status: blood pressure returned to baseline and stable Postop Assessment: no apparent nausea or vomiting, able to ambulate and adequate PO intake (sitting in chair) Anesthetic complications: no   No notable events documented.  Last Vitals:  Vitals:   01/22/24 1150 01/22/24 1200  BP:  139/64  Pulse: 70 69  Resp: 14 14  Temp:    SpO2: 95% 94%    Last Pain:  Vitals:   01/22/24 1347  TempSrc:   PainSc: 8                  Juliann Olesky,E. Andra Matsuo

## 2024-01-22 NOTE — Progress Notes (Signed)
 NAME:  Darin Mcclure, MRN:  [id number], DOB:  1941/03/12, LOS: 7 ADMISSION DATE:  01/14/2024, CONSULTATION DATE:  2/25 REFERRING MD:  Cliffton Asters, CHIEF COMPLAINT:  post--CABG   History of Present Illness:  Darin Mcclure is an 83 y/o gentleman with a history of HTN, HLD, CAD, DM who presented on 2/18 with CP & DOE walking up the steps that persisted for about 1 day, eventually also developing fatigue. He had previously been treated for a URI with steroids and antibiotics. He was noted to be in Afib at presentation and had influenza A, which has since resolved.  LHC demonstrated diffuse disease in mid-LCX, mid-LAD, second diag, and proximal RCA. Echo with LVEF 65-70%, no RWMA.  He was on aspirin and plavix at baseline since previous stent in 2007. He underwent CABG x 4 and MAZE. Post-MAZE Afib was resolved and he required pacing for native HR in 20-30s.  He had 600 EBL, 300 cell saver.  Pump time 121 min, cross clamp 56 min. He was transferred to the ICU on MV and neosynephrine with V-pacing. Albuterol given in OR for low saturations.   Pertinent  Medical History  CAD DM Afib RBBB HLD HTN GERD CKD3a  Significant Hospital Events: Including procedures, antibiotic start and stop dates in addition to other pertinent events   2/18 admitted 2/25 CABGx4, MAZE  Interim History / Subjective:  Still having significant pain; required low dose precedex overnight. Now having vertical vertigo- better with eyes closed.  No nausea, trouble swallowing or speaking. No asymmetric weakness. No diplopia.  Objective   Blood pressure 103/68, pulse 88, temperature 98.6 F (37 C), resp. rate 13, height 6' (1.829 m), weight 110.3 kg, SpO2 98%. CVP:  [1 mmHg-16 mmHg] 6 mmHg CO:  [4.7 L/min-8.6 L/min] 7.5 L/min CI:  [0.3 L/min/m2-3.9 L/min/m2] 3.3 L/min/m2  Vent Mode: PSV;CPAP FiO2 (%):  [40 %-50 %] 40 % Set Rate:  [4 bmp-16 bmp] 4 bmp Vt Set:  [620 mL] 620 mL PEEP:  [5 cmH20] 5 cmH20 Pressure Support:  [10  cmH20] 10 cmH20 Plateau Pressure:  [18 cmH20] 18 cmH20   Intake/Output Summary (Last 24 hours) at 01/22/2024 0713 Last data filed at 01/22/2024 0700 Gross per 24 hour  Intake 4672.91 ml  Output 4090 ml  Net 582.91 ml   Filed Weights   01/20/24 0624 01/21/24 0543 01/22/24 0500  Weight: 101.7 kg 101.9 kg 110.3 kg    Examination: General: elderly man lying in bed in NAD, less apparent pain compared to last evening's exam HENT: [place]/AT, eyes anicteric Lungs: breathing comfortably on [place], only splinting with larger breaths. No conversational dyspnea. Cardiovascular: S1S2, RRR Abdomen: soft, NT Extremities: peripheral edema, no cyanosis Neuro: Awake, alert, normal speech. Answering questions appropriately, but frequently closing eyes during the encounter. No deficiency in hand rapid repetitive movements. Symmetric grip strength. No nystagmus in any direction, EOMI, normal accomodation. GU: foley with amber urine  CXR personally reviewed> RIJ CVC, small left effusion, chest tubes. No opacities.  EKG personally reviewed> SB, RBB, normal Qtc. No ischemic changes.  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 Adding robaxin. No toradol due to CKD. -daily aspirin and statin -planning to start apixaban after pacing wires  d/c -metoprolol once off Neosynephrine -pulmonary hygiene, IS -progress diet and mobility -complete post-op antibiotics  Recent influenza A, may explain response to bronchodilators in OR. No history of obstructive lung disease or smoking. -brovana and yupelri ok to d/c -duonebs PRN  Vertigo- not sure if this is medication side effect, otolith dysfunction, or CNS cause. Seems this has been persistent at least  overnight/ since waking up post extubation. Better with eye closing, no associated posterior circulation deficits noted on my exam.  -very careful with mobility; worry about high fall risk right now -Unfortunately with his pain being poorly controlled right now it's hard to deescalate his pain regimen. Will review with TCTS before making additional med changes or imaging. Symptoms ongoing >12 hrs.   Hyperglycemia; h/o DM. A1c 7.1 -transition to basal bolus insulin -hold PTA metformin and januvia  ABLA- expected post-op Consumptive thrombocytopenia- expected post-op Consumptive coagulopathy- expected post-op -no need for transfusion currently  H/o HTN -con't to hold PTA losartan, Imdur, toprol  AKI on CKD 3a -maintain adequate perfusion -strict I/O -renally dose meds, avoid nephrotoxic meds  Hypothyroidism -con't synthroid  GERD -con't PPI   Best Practice (right click and "Reselect all SmartList Selections" daily)   Diet/type: clear liquids, progress as tolerated DVT prophylaxis SCD Pressure ulcer(s): N/A GI prophylaxis: H2B and PPI Lines: Central line and Arterial Line Foley:  Yes, and it is still needed Code Status:  full code Last date of multidisciplinary goals of care discussion [ ]   Labs   CBC: Recent Labs  Lab 01/20/24 0207 01/21/24 0456 01/21/24 0826 01/21/24 1213 01/21/24 1225 01/21/24 1403 01/21/24 1404 01/21/24 1639 01/21/24 1752 01/21/24 1826 01/21/24 1930 01/22/24 0354  WBC 6.8 6.5  --   --   --  11.8*  --   --   --  12.5*  --  11.0*  HGB 13.7 13.3   < > 7.6*   < > 9.9*   < > 8.8* 8.5* 9.1* 8.2* 9.1*  HCT 40.2 38.7*   < > 22.6*   < > 28.9*   < > 26.0* 25.0* 26.6* 24.0* 27.0*  MCV 90.3 90.0  --   --   --  92.3  --   --   --  91.4  --  92.5  PLT 144* 127*  --  107*  --  73*  --   --   --  90*  --  89*   < > = values in this interval not displayed.    Basic Metabolic Panel: Recent Labs  Lab 01/16/24 0923 01/17/24 0205 01/19/24 0207  01/20/24 0207 01/21/24 0456 01/21/24 0826 01/21/24 1042 01/21/24 1137 01/21/24 1225 01/21/24 1250 01/21/24 1639 01/21/24 1752 01/21/24 1826 01/21/24 1930 01/22/24 0354  NA 139   < > 138 137 138   < > 139 136 138   < > 143 143 142 143 138  K 4.2   < > 3.7 4.3 3.9   < > 4.5 4.8 5.0   < > 3.7 3.6 3.7 3.7 4.1  CL 102   < > 103 104 106   < > 103 103 102  --   --   --  104  --  106  CO2 23   < > 23 22 21*  --   --   --   --   --   --   --  22  --  21*  GLUCOSE 181*   < > 152* 149* 160*   < >  108* 136* 160*  --   --   --  172*  --  136*  BUN 30*   < > 21 21 21    < > 16 16 19   --   --   --  15  --  18  CREATININE 1.55*   < > 1.38* 1.58* 1.37*   < > 1.20 1.00 1.10  --   --   --  1.30*  --  1.44*  CALCIUM 9.5   < > 9.2 9.3 9.1  --   --   --   --   --   --   --  7.8*  --  7.8*  MG 1.8  --  1.8  --   --   --   --   --   --   --   --   --  2.8*  --  2.7*   < > = values in this interval not displayed.   GFR: Estimated Creatinine Clearance: 51.6 mL/min (A) (by C-G formula based on SCr of 1.44 mg/dL (H)). Recent Labs  Lab 01/15/24 1012 01/16/24 0923 01/21/24 0456 01/21/24 1403 01/21/24 1826 01/22/24 0354  PROCALCITON <0.10  --   --   --   --   --   WBC  --    < > 6.5 11.8* 12.5* 11.0*   < > = values in this interval not displayed.        Critical care time:  42 min.     Steffanie Dunn, DO 01/22/24 9:47 AM [facility]  For contact information, see Amion. If no response to pager, please call PCCM consult pager. After hours, 7PM- 7AM, please call Elink.

## 2024-01-22 NOTE — Progress Notes (Signed)
 Patient ID: Darin Mcclure, male   DOB: 02-17-1941, 83 y.o.   MRN: [id number]  TCTS Evening Rounds:  Hemodynamically stable   Ventricular pacing at 70.  Sats 95% 1L  UO ok  CT output low.  BMET    Component Value Date/Time   NA 137 01/22/2024 1715   NA 137 12/25/2017 0845   K 4.1 01/22/2024 1715   CL 104 01/22/2024 1715   CO2 21 (L) 01/22/2024 1715   GLUCOSE 208 (H) 01/22/2024 1715   BUN 20 01/22/2024 1715   BUN 28 (H) 12/25/2017 0845   CREATININE 1.81 (H) 01/22/2024 1715   CALCIUM 7.9 (L) 01/22/2024 1715   GFRNONAA 37 (L) 01/22/2024 1715   GFRNONAA 58 07/26/2023 1314   CBC    Component Value Date/Time   WBC 14.0 (H) 01/22/2024 1715   RBC 2.89 (L) 01/22/2024 1715   HGB 9.0 (L) 01/22/2024 1715   HGB 13.7 12/25/2017 0845   HCT 27.3 (L) 01/22/2024 1715   HCT 40.4 12/25/2017 0845   PLT 100 (L) 01/22/2024 1715   PLT 161 12/25/2017 0845   MCV 94.5 01/22/2024 1715   MCV 92 12/25/2017 0845   MCH 31.1 01/22/2024 1715   MCHC 33.0 01/22/2024 1715   RDW 14.3 01/22/2024 1715   RDW 13.5 12/25/2017 0845   LYMPHSABS 1.6 02/05/2022 1115   MONOABS 0.4 02/05/2022 1115   EOSABS 0.1 02/05/2022 1115   BASOSABS 0.0 02/05/2022 1115

## 2024-01-22 NOTE — Plan of Care (Signed)
  Problem: Coping: Goal: Level of anxiety will decrease Outcome: Progressing   Problem: Pain Managment: Goal: General experience of comfort will improve and/or be controlled Outcome: Progressing   Problem: Safety: Goal: Ability to remain free from injury will improve Outcome: Progressing   Problem: Cardiovascular: Goal: Ability to achieve and maintain adequate cardiovascular perfusion will improve Outcome: Progressing   Problem: Activity: Goal: Risk for activity intolerance will decrease Outcome: Progressing   Problem: Cardiac: Goal: Will achieve and/or maintain hemodynamic stability Outcome: Progressing

## 2024-01-22 NOTE — Progress Notes (Signed)
 [address]       [place] [address]             [phone]                 1 Day Post-Op Procedure(s) (LRB): CORONARY ARTERY BYPASS GRAFTING (CABG) TIMES FOUR USING LEFT INTERNAL MAMMARY ARTERY AND BILATERAL ENDOSCOPICALLY HARVESTED GREATER SAPHENOUS VEINS (N/A) CLIPPING OF ATRIAL APPENDAGE USING ATRICURE ATRICLIP PRO2 (N/A) TRANSESOPHAGEAL ECHOCARDIOGRAM (TEE) (N/A) MAZE (N/A)   Events: No events Some dizziness with standing today _______________________________________________________________ Vitals: BP 111/67   Pulse 70   Temp 98.8 F (37.1 C)   Resp (!) 8   Ht 6' (1.829 m)   Wt 110.3 kg   SpO2 99%   BMI 32.98 kg/m  Filed Weights   01/20/24 0624 01/21/24 0543 01/22/24 0500  Weight: 101.7 kg 101.9 kg 110.3 kg     - Neuro: alert NAD  - Cardiovascular: sinus brady.  Drips: neo 25, amio 30.   CVP:  [1 mmHg-16 mmHg] 7 mmHg CO:  [4.7 L/min-8.6 L/min] 6.8 L/min CI:  [0.3 L/min/m2-3.9 L/min/m2] 3 L/min/m2  - Pulm: EWOB  ABG    Component Value Date/Time   PHART 7.308 (L) 01/21/2024 1930   PCO2ART 41.7 01/21/2024 1930   PO2ART 89 01/21/2024 1930   HCO3 20.8 01/21/2024 1930   TCO2 22 01/21/2024 1930   ACIDBASEDEF 5.0 (H) 01/21/2024 1930   O2SAT 96 01/21/2024 1930    - Abd: ND - Extremity: warm  .Intake/Output      02/25 0701 02/26 0700 02/26 0701 02/27 0700   P.O.     I.V. (mL/kg) 2755.4 (25)    Blood 300    IV Piggyback 1617.5    Total Intake(mL/kg) 4672.9 (42.4)    Urine (mL/kg/hr) 2630 (1)    Blood 600    Chest Tube 860    Total Output 4090    Net +582.9            _______________________________________________________________ Labs:    Latest Ref Rng & Units 01/22/2024    3:54 AM 01/21/2024    7:30 PM 01/21/2024    6:26 PM  CBC  WBC 4.0 - 10.5 K/uL 11.0   12.5   Hemoglobin 13.0 - 17.0 g/dL 9.1  8.2  9.1   Hematocrit 39.0 - 52.0 % 27.0  24.0  26.6   Platelets 150 - 400 K/uL 89   90       Latest Ref Rng  & Units 01/22/2024    3:54 AM 01/21/2024    7:30 PM 01/21/2024    6:26 PM  CMP  Glucose 70 - 99 mg/dL 914   782   BUN 8 - 23 mg/dL 18   15   Creatinine 9.56 - 1.24 mg/dL 2.13   0.86   Sodium 578 - 145 mmol/L 138  143  142   Potassium 3.5 - 5.1 mmol/L 4.1  3.7  3.7   Chloride 98 - 111 mmol/L 106   104   CO2 22 - 32 mmol/L 21   22   Calcium 8.9 - 10.3 mg/dL 7.8   7.8     CXR: PV congestion  _______________________________________________________________  Assessment and Plan: POD 1 s/p CABG, LA MAZE, atriclip  Neuro: pain controlled.  Will continue to follow symptoms for now CV: will keep pacing given better output at higher rate.  Will transition amio to PO.  Eliquis once wires out.  Wean neo.  Hold BB Pulm: IS, ambulation Renal: creat up slightly.  Will watch GI: on diet Heme: stable ID: afebrile Endo: SSI  Dispo: continue ICU care   Corliss Skains 01/22/2024 8:32 AM

## 2024-01-23 ENCOUNTER — Telehealth (HOSPITAL_COMMUNITY): Payer: Self-pay | Admitting: Pharmacy Technician

## 2024-01-23 ENCOUNTER — Other Ambulatory Visit (HOSPITAL_COMMUNITY): Payer: Self-pay

## 2024-01-23 ENCOUNTER — Inpatient Hospital Stay (HOSPITAL_COMMUNITY): Payer: Medicare Other

## 2024-01-23 DIAGNOSIS — I10 Essential (primary) hypertension: Secondary | ICD-10-CM

## 2024-01-23 DIAGNOSIS — D62 Acute posthemorrhagic anemia: Secondary | ICD-10-CM | POA: Diagnosis not present

## 2024-01-23 DIAGNOSIS — I4891 Unspecified atrial fibrillation: Secondary | ICD-10-CM | POA: Diagnosis not present

## 2024-01-23 DIAGNOSIS — N179 Acute kidney failure, unspecified: Secondary | ICD-10-CM | POA: Diagnosis not present

## 2024-01-23 DIAGNOSIS — R739 Hyperglycemia, unspecified: Secondary | ICD-10-CM | POA: Diagnosis not present

## 2024-01-23 LAB — BASIC METABOLIC PANEL
Anion gap: 13 (ref 5–15)
BUN: 23 mg/dL (ref 8–23)
CO2: 21 mmol/L — ABNORMAL LOW (ref 22–32)
Calcium: 7.8 mg/dL — ABNORMAL LOW (ref 8.9–10.3)
Chloride: 100 mmol/L (ref 98–111)
Creatinine, Ser: 1.76 mg/dL — ABNORMAL HIGH (ref 0.61–1.24)
GFR, Estimated: 38 mL/min — ABNORMAL LOW (ref >60)
Glucose, Bld: 197 mg/dL — ABNORMAL HIGH (ref 70–99)
Potassium: 3.8 mmol/L (ref 3.5–5.1)
Sodium: 134 mmol/L — ABNORMAL LOW (ref 135–145)

## 2024-01-23 LAB — CBC
HCT: 25.8 % — ABNORMAL LOW (ref 39.0–52.0)
Hemoglobin: 8.6 g/dL — ABNORMAL LOW (ref 13.0–17.0)
MCH: 31.4 pg (ref 26.0–34.0)
MCHC: 33.3 g/dL (ref 30.0–36.0)
MCV: 94.2 fL (ref 80.0–100.0)
Platelets: 92 10*3/uL — ABNORMAL LOW (ref 150–400)
RBC: 2.74 MIL/uL — ABNORMAL LOW (ref 4.22–5.81)
RDW: 14.6 % (ref 11.5–15.5)
WBC: 11.5 10*3/uL — ABNORMAL HIGH (ref 4.0–10.5)
nRBC: 0 % (ref 0.0–0.2)

## 2024-01-23 LAB — GLUCOSE, CAPILLARY
Glucose-Capillary: 173 mg/dL — ABNORMAL HIGH (ref 70–99)
Glucose-Capillary: 261 mg/dL — ABNORMAL HIGH (ref 70–99)
Glucose-Capillary: 228 mg/dL — ABNORMAL HIGH (ref 70–99)
Glucose-Capillary: 218 mg/dL — ABNORMAL HIGH (ref 70–99)
Glucose-Capillary: 162 mg/dL — ABNORMAL HIGH (ref 70–99)

## 2024-01-23 LAB — POCT I-STAT, CHEM 8
BUN: 23 mg/dL (ref 8–23)
Calcium, Ion: 1.13 mmol/L — ABNORMAL LOW (ref 1.15–1.40)
Chloride: 98 mmol/L (ref 98–111)
Creatinine, Ser: 1.6 mg/dL — ABNORMAL HIGH (ref 0.61–1.24)
Glucose, Bld: 195 mg/dL — ABNORMAL HIGH (ref 70–99)
HCT: 23 % — ABNORMAL LOW (ref 39.0–52.0)
Hemoglobin: 7.8 g/dL — ABNORMAL LOW (ref 13.0–17.0)
Potassium: 3.7 mmol/L (ref 3.5–5.1)
Sodium: 135 mmol/L (ref 135–145)
TCO2: 22 mmol/L (ref 22–32)

## 2024-01-23 MED ORDER — POLYETHYLENE GLYCOL 3350 17 G PO PACK
17.0000 g | PACK | Freq: Every day | ORAL | Status: DC
  Administered 2024-01-23 – 2024-01-26 (×3): 17 g via ORAL
  Filled 2024-01-23 (×3): qty 1

## 2024-01-23 MED ORDER — SODIUM CHLORIDE 0.9 % IV SOLN: 250.0000 mL | INTRAVENOUS | Status: AC | PRN

## 2024-01-23 MED ORDER — INSULIN ASPART 100 UNIT/ML IJ SOLN
2.0000 [IU] | Freq: Three times a day (TID) | INTRAMUSCULAR | Status: DC
  Administered 2024-01-23 – 2024-01-24 (×3): 2 [IU] via SUBCUTANEOUS

## 2024-01-23 MED ORDER — METOPROLOL TARTRATE 12.5 MG HALF TABLET
12.5000 mg | ORAL_TABLET | Freq: Two times a day (BID) | ORAL | Status: DC
  Administered 2024-01-23 – 2024-01-26 (×7): 12.5 mg via ORAL
  Filled 2024-01-23 (×7): qty 1

## 2024-01-23 MED ORDER — SENNOSIDES-DOCUSATE SODIUM 8.6-50 MG PO TABS
1.0000 | ORAL_TABLET | Freq: Every day | ORAL | Status: DC
  Administered 2024-01-23 – 2024-01-26 (×3): 1 via ORAL
  Filled 2024-01-23 (×3): qty 1

## 2024-01-23 MED ORDER — SODIUM CHLORIDE 0.9% FLUSH: 3.0000 mL | INTRAVENOUS | Status: DC | PRN

## 2024-01-23 MED ORDER — ~~LOC~~ CARDIAC SURGERY, PATIENT & FAMILY EDUCATION: Freq: Once | Status: AC

## 2024-01-23 MED ORDER — FUROSEMIDE 40 MG PO TABS
40.0000 mg | ORAL_TABLET | Freq: Every day | ORAL | Status: DC
  Administered 2024-01-23 – 2024-01-26 (×4): 40 mg via ORAL
  Filled 2024-01-23 (×4): qty 1

## 2024-01-23 MED ORDER — SODIUM CHLORIDE 0.9% FLUSH
3.0000 mL | Freq: Two times a day (BID) | INTRAVENOUS | Status: DC
  Administered 2024-01-23 – 2024-01-24 (×3): 3 mL via INTRAVENOUS

## 2024-01-23 MED ORDER — ASPIRIN 81 MG PO TBEC
81.0000 mg | DELAYED_RELEASE_TABLET | Freq: Every day | ORAL | Status: DC
  Administered 2024-01-23 – 2024-01-26 (×4): 81 mg via ORAL
  Filled 2024-01-23 (×4): qty 1

## 2024-01-23 MED ORDER — POTASSIUM CHLORIDE CRYS ER 20 MEQ PO TBCR
20.0000 meq | EXTENDED_RELEASE_TABLET | ORAL | Status: AC
  Administered 2024-01-23 (×2): 20 meq via ORAL
  Filled 2024-01-23 (×2): qty 1

## 2024-01-23 MED ORDER — APIXABAN 2.5 MG PO TABS
2.5000 mg | ORAL_TABLET | Freq: Two times a day (BID) | ORAL | Status: DC
  Administered 2024-01-23 – 2024-01-26 (×6): 2.5 mg via ORAL
  Filled 2024-01-23 (×6): qty 1

## 2024-01-23 NOTE — Progress Notes (Signed)
   Inpatient Rehab Admissions Coordinator :  Per therapy recommendations patient was screened for CIR candidacy by Ottie Glazier RN MSN. Noted dizziness/vertigo is limiting factor today. Would wait to see how that resolves before pursuing possible CIR admit. We will follow. Please contact me with any questions.  Ottie Glazier RN MSN Admissions Coordinator [phone]

## 2024-01-23 NOTE — Progress Notes (Signed)
 NAME:  Darin Mcclure, MRN:  [id number], DOB:  Apr 15, 1941, LOS: 8 ADMISSION DATE:  01/14/2024, CONSULTATION DATE:  2/25 REFERRING MD:  Cliffton Asters, CHIEF COMPLAINT:  post--CABG   History of Present Illness:  Darin Mcclure is an 83 y/o gentleman with a history of HTN, HLD, CAD, DM who presented on 2/18 with CP & DOE walking up the steps that persisted for about 1 day, eventually also developing fatigue. He had previously been treated for a URI with steroids and antibiotics. He was noted to be in Afib at presentation and had influenza A, which has since resolved.  LHC demonstrated diffuse disease in mid-LCX, mid-LAD, second diag, and proximal RCA. Echo with LVEF 65-70%, no RWMA.  He was on aspirin and plavix at baseline since previous stent in 2007. He underwent CABG x 4 and MAZE. Post-MAZE Afib was resolved and he required pacing for native HR in 20-30s.  He had 600 EBL, 300 cell saver.  Pump time 121 min, cross clamp 56 min. He was transferred to the ICU on MV and neosynephrine with V-pacing. Albuterol given in OR for low saturations.   Pertinent  Medical History  CAD DM Afib RBBB HLD HTN GERD CKD3a  Significant Hospital Events: Including procedures, antibiotic start and stop dates in addition to other pertinent events   2/18 admitted 2/25 CABGx4, MAZE  Interim History / Subjective:  Feeling improved; pain better but still significant with deep breaths. Not sleeping well. Vision fine now, was having previous sensation of things getting closer and further away, not really spinning, and it was mostly associated with standing. No trouble with walking this morning.   Objective   Blood pressure 127/72, pulse 72, temperature 97.8 F (36.6 C), temperature source Oral, resp. rate 12, height 6' (1.829 m), weight 111.8 kg, SpO2 97%. CVP:  [5 mmHg-13 mmHg] 12 mmHg CO:  [6 L/min-7.9 L/min] 7.4 L/min CI:  [2.7 L/min/m2-3.5 L/min/m2] 3.3 L/min/m2      Intake/Output Summary (Last 24 hours) at  01/23/2024 0730 Last data filed at 01/23/2024 0630 Gross per 24 hour  Intake 200.25 ml  Output 620 ml  Net -419.75 ml   Filed Weights   01/21/24 0543 01/22/24 0500 01/23/24 0600  Weight: 101.9 kg 110.3 kg 111.8 kg    Examination: General: elderly man sitting in the recliner in NAD HENT: [place]/AT, eyes anicteric Lungs: breathing comfortably on [place], reduced basilar breath sounds. Pleural rub anteriorly on the left. Cardiovascular: S1S2, RRR rate in upper 70s. Minimal serosanguinous chest tube output.  Abdomen: soft, NT Extremities: mild edema, no cyanosis Neuro: awake, alert, tracking normally, moving all extremities. Normal speech, answering questions appropriately.  Derm: warm, dry, no rashes. Sternal dressing intact.  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 No history of obstructive lung disease or smoking. -duonebs PRN  Lightheadedness rather than vertigo- most likely medication side effect and orthostasis post-op -PT to help with mobility -progressive mobility with help -would avoid overly  controlling BP for now  Hyperglycemia; h/o DM. A1c 7.1 -con't glargine 5 untis daily -adding mealtime insulin aspart 2 units TIDAC -transition to SSI TIDAC + QHS -con't to hold PTA metformin and januvia with AKI  ABLA- expected post-op Consumptive thrombocytopenia- expected post-op -monitor, no need for  transfusion  H/o HTN -con't holding losartan, Imdur, toprol  AKI on CKD 3a; plateaued -maintain adequate perfusion -strict I/O -renally dose meds, avoid nephrotoxic meds  Hypothyroidism -con't synthroid  GERD -PPI  Stable to transfer out of ICU today. Son and patient updated at bedside. PCCM will be available as needed. Please call with questions.  Best Practice (right click and "Reselect all SmartList Selections" daily)   Diet/type: Regular consistency (see orders) DVT prophylaxis DOAC Pressure ulcer(s): N/A GI prophylaxis: PPI Lines: Central line Foley:  removal ordered  Code Status:  full code Last date of multidisciplinary goals of care discussion [ ]   Labs   CBC: Recent Labs  Lab 01/21/24 1403 01/21/24 1404 01/21/24 1826 01/21/24 1930 01/22/24 0354 01/22/24 1715 01/23/24 0536  WBC 11.8*  --  12.5*  --  11.0* 14.0* 11.5*  HGB 9.9*   < > 9.1* 8.2* 9.1* 9.0* 8.6*  HCT 28.9*   < > 26.6* 24.0* 27.0* 27.3* 25.8*  MCV 92.3  --  91.4  --  92.5 94.5 94.2  PLT 73*  --  90*  --  89* 100* 92*   < > = values in this interval not displayed.    Basic Metabolic Panel: Recent Labs  Lab 01/16/24 0923 01/17/24 0205 01/19/24 0207 01/20/24 0207 01/21/24 0456 01/21/24 0826 01/21/24 1225 01/21/24 1250 01/21/24 1826 01/21/24 1930 01/22/24 0354 01/22/24 1715 01/23/24 0536  NA 139   < > 138   < > 138   < > 138   < > 142 143 138 137 134*  K 4.2   < > 3.7   < > 3.9   < > 5.0   < > 3.7 3.7 4.1 4.1 3.8  CL 102   < > 103   < > 106   < > 102  --  104  --  106 104 100  CO2 23   < > 23   < > 21*  --   --   --  22  --  21* 21* 21*  GLUCOSE 181*   < > 152*   < > 160*   < > 160*  --  172*  --  136* 208* 197*  BUN 30*   < > 21   < > 21   < > 19  --  15  --  18 20 23   CREATININE 1.55*   < > 1.38*   < > 1.37*   < > 1.10  --  1.30*  --  1.44* 1.81* 1.76*  CALCIUM 9.5   < > 9.2   < > 9.1  --   --   --  7.8*  --  7.8* 7.9* 7.8*  MG 1.8  --  1.8  --   --   --   --   --  2.8*  --   2.7* 2.7*  --    < > = values in this interval not displayed.   GFR: Estimated Creatinine Clearance: 42.5 mL/min (A) (by C-G formula based on SCr of 1.76 mg/dL (H)). Recent Labs  Lab 01/21/24 1826 01/22/24 0354 01/22/24 1715 01/23/24 0536  WBC 12.5* 11.0* 14.0* 11.5*  Critical care time:        Steffanie Dunn, DO 01/23/24 9:08 AM  Pulmonary & Critical Care  For contact information, see Amion. If no response to pager, please call PCCM consult pager. After hours, 7PM- 7AM, please call Elink.

## 2024-01-23 NOTE — Telephone Encounter (Signed)
 Patient Product/process development scientist completed.    The patient is insured through [employer]. Patient has [employer], may use a copay card, and/or apply for patient assistance if available.    Ran test claim for Farxiga 10 mg and the current 30 day co-pay is $38.08.  Ran test claim for Jardiance 10 mg and the current 30 day co-pay is $39.53.  This test claim was processed through [facility]- copay amounts may vary at other pharmacies due to pharmacy/plan contracts, or as the patient moves through the different stages of their insurance plan.     Roland Earl, CPHT Pharmacy Technician III Certified Patient Advocate [facility] Patient Advocate Team Direct Number: [phone]  Fax: [phone]

## 2024-01-23 NOTE — TOC Progression Note (Addendum)
 Transition of Care [facility]) - Progression Note    Patient Details  Name: Darin Mcclure MRN: [id number] Date of Birth: September 26, 1941  Transition of [facility]) CM/SW Contact  Graves-Bigelow, Lamar Laundry, RN Phone Number: 01/23/2024, 1:00 PM  Clinical Narrative:  Patient was discussed in progression rounds-POD-2 CABG. Case Manager spoke with patient post PT/OT consult and he was dizzy. Plan still remains to return home. Patient states he is from home with spouse. Patient has been pre approved for home health services via [facility]. Case Manager will continue to follow for additional transition of care needs.    Expected Discharge Plan: Home w Home Health Services Barriers to Discharge: Continued Medical Work up  Expected Discharge Plan and Services In-house Referral: NA Discharge Planning Services: CM Consult Post Acute Care Choice: Home Health Living arrangements for the past 2 months: Single Family Home   Social Determinants of Health (SDOH) Interventions SDOH Screenings   Food Insecurity: No Food Insecurity (01/15/2024)  Housing: Low Risk  (01/15/2024)  Transportation Needs: No Transportation Needs (01/15/2024)  Utilities: Not At Risk (01/15/2024)  Financial Resource Strain: Low Risk  (12/30/2023)   Received from [facility]  Physical Activity: Insufficiently Active (07/31/2023)   Received from [facility]  Social Connections: Socially Integrated (01/15/2024)  Stress: No Stress Concern Present (07/31/2023)   Received from [facility]  Tobacco Use: Low Risk  (01/21/2024)   Readmission Risk Interventions     No data to display

## 2024-01-23 NOTE — Evaluation (Signed)
 Physical Therapy Evaluation Patient Details Name: Darin Mcclure MRN: [id number] DOB: [birth year] Today's Date: 01/23/2024  History of Present Illness  Mr. Menz is an 83 y/o gentleman who presented on 2/18 with CP & DOE He was noted to be in Afib at presentation and had influenza A, which has since resolved.  LHC demonstrated diffuse disease in mid-LCX, mid-LAD, second diag, and proximal RCA. Echo with LVEF 65-70%, no RWMA.  He underwent CABG x 4 and MAZE on 2/25. HAs chest tube 2/25-2/26.  Post-MAZE Afib was resolved and he required pacing for native HR in 20-30s.  He was transferred to the ICU on meds with V-pacing.  PMH: HTN, HLD, CAD, DM  Clinical Impression  Pt admitted with above diagnosis. Pt limited today by dizziness (? Due to vertigo but couldn't fully assess due to chest tubes and incision) as well as chest tube leaking (nursing to remove after walk).  MD made aware that pt may benefit from Meclizine or Antivert to help with dizziness while healing. Pt may need post acute Rehab > 3 hours day if vertigo persists. Will follow acutely.  Pt currently with functional limitations due to the deficits listed below (see PT Problem List). Pt will benefit from acute skilled PT to increase their independence and safety with mobility to allow discharge.           If plan is discharge home, recommend the following: Two people to help with walking and/or transfers;A lot of help with bathing/dressing/bathroom;Assistance with cooking/housework;Assist for transportation;Help with stairs or ramp for entrance   Can travel by private vehicle        Equipment Recommendations None recommended by PT  Recommendations for Other Services  Rehab consult    Functional Status Assessment Patient has had a recent decline in their functional status and demonstrates the ability to make significant improvements in function in a reasonable and predictable amount of time.     Precautions / Restrictions  Precautions Precautions: Sternal;Fall Precaution Booklet Issued: Yes (comment) Recall of Precautions/Restrictions: Impaired Precaution/Restrictions Comments: chest tube Restrictions Weight Bearing Restrictions Per Provider Order: No      Mobility  Bed Mobility Overal bed mobility: Needs Assistance Bed Mobility: Rolling, Sidelying to Sit Rolling: Mod assist, +2 for safety/equipment Sidelying to sit: Mod assist, HOB elevated       General bed mobility comments: Pt reports he has a bed that the head raises at home. Pt needed cues and assist to follow precautions to come to EOB.  Had trouble figuring out where to place hands/arms to sit up.    Transfers Overall transfer level: Needs assistance Equipment used: Rolling walker (2 wheels) Transfers: Sit to/from Stand Sit to Stand: Min assist, +2 safety/equipment, From elevated surface           General transfer comment: Pt needed cues for pushing up on knees and assist to come to standing.  Pt asked to urinate therefore got urinal and pt used urinal while standing. Pt reported that he was dizzy therefore stood a bit as pt stated it "usually improves if I stand".  Suspect vestibular issue but couldnt fully asssess today due to chest tubes in place.    Ambulation/Gait Ambulation/Gait assistance: Contact guard assist, +2 safety/equipment Gait Distance (Feet): 65 Feet Assistive device: Rolling walker (2 wheels) Gait Pattern/deviations: Step-through pattern, Decreased stride length, Wide base of support, Antalgic   Gait velocity interpretation: <1.31 ft/sec, indicative of household ambulator   General Gait Details: Pt feeling much more dizzy today per pt.  Cued pt to focus on target on wall which pt stated did help him.  Overall with pt using compensatory strategies he walked with CGA with RW.  Limited distance due to dizziness with pt generally feeling fatigued as well as due to chest tube was oozing and blood started dripping on floor.   Nurse made aware and stated that it has been oozing and he is going to pull lines after our treatment.  Stairs            Wheelchair Mobility     Tilt Bed    Modified Rankin (Stroke Patients Only)       Balance Overall balance assessment: Needs assistance Sitting-balance support: No upper extremity supported, Feet supported Sitting balance-Leahy Scale: Fair     Standing balance support: Bilateral upper extremity supported, During functional activity, Reliant on assistive device for balance Standing balance-Leahy Scale: Poor Standing balance comment: relies on UE support for balance                             Pertinent Vitals/Pain Pain Assessment Pain Assessment: Faces Faces Pain Scale: Hurts little more Pain Location: chest tube site and incision Pain Descriptors / Indicators: Discomfort, Grimacing, Guarding Pain Intervention(s): Limited activity within patient's tolerance, Monitored during session, Repositioned    Home Living Family/patient expects to be discharged to:: Private residence Living Arrangements: Spouse/significant other Available Help at Discharge: Family;Available 24 hours/day Type of Home: House Home Access: Stairs to enter Entrance Stairs-Rails: Right Entrance Stairs-Number of Steps: 4   Home Layout: Two level;Able to live on main level with bedroom/bathroom Home Equipment: Shower seat - built Magazine features editor (2 wheels)      Prior Function Prior Level of Function : Independent/Modified Independent;Driving                     Extremity/Trunk Assessment   Upper Extremity Assessment Upper Extremity Assessment: Defer to OT evaluation    Lower Extremity Assessment Lower Extremity Assessment: Generalized weakness    Cervical / Trunk Assessment Cervical / Trunk Assessment: Normal  Communication   Communication Communication: No apparent difficulties    Cognition Arousal: Alert Behavior During Therapy:  Flat affect   PT - Cognitive impairments: Problem solving, Safety/Judgement                       PT - Cognition Comments: Pt with some cues needed for safety with movement to follow precautions. Following commands: Intact       Cueing Cueing Techniques: Verbal cues, Tactile cues     General Comments General comments (skin integrity, edema, etc.): VSS    Exercises     Assessment/Plan    PT Assessment Patient needs continued PT services  PT Problem List Decreased activity tolerance;Decreased balance;Decreased mobility;Decreased knowledge of use of DME;Decreased safety awareness;Decreased knowledge of precautions;Cardiopulmonary status limiting activity       PT Treatment Interventions DME instruction;Gait training;Functional mobility training;Therapeutic activities;Therapeutic exercise;Balance training;Patient/family education    PT Goals (Current goals can be found in the Care Plan section)  Acute Rehab PT Goals Patient Stated Goal: to get rid of dizziness PT Goal Formulation: With patient Time For Goal Achievement: 02/06/24 Potential to Achieve Goals: Good    Frequency Min 1X/week     Co-evaluation               AM-PAC PT "6 Clicks" Mobility  Outcome Measure Help needed turning from your back  to your side while in a flat bed without using bedrails?: A Little Help needed moving from lying on your back to sitting on the side of a flat bed without using bedrails?: A Lot Help needed moving to and from a bed to a chair (including a wheelchair)?: Total Help needed standing up from a chair using your arms (e.g., wheelchair or bedside chair)?: Total Help needed to walk in hospital room?: Total Help needed climbing 3-5 steps with a railing? : Total 6 Click Score: 9    End of Session Equipment Utilized During Treatment: Gait belt Activity Tolerance: Patient limited by fatigue (limited by dizziness and chest tube leaking) Patient left: in bed;with call  bell/phone within reach;with bed alarm set;with SCD's reapplied Nurse Communication: Mobility status (chest tube leaking) PT Visit Diagnosis: Muscle weakness (generalized) (M62.81);Other abnormalities of gait and mobility (R26.89)    Time: 6213-0865 PT Time Calculation (min) (ACUTE ONLY): 37 min   Charges:   PT Evaluation $PT Eval Moderate Complexity: 1 Mod PT Treatments $Gait Training: 8-22 mins PT General Charges $$ ACUTE PT VISIT: 1 Visit         Sherra Kimmons M,PT Acute Rehab Services [phone]   Bevelyn Buckles 01/23/2024, 2:00 PM

## 2024-01-23 NOTE — Progress Notes (Signed)
 [address]       [place] [place] [address]             [phone]                 2 Days Post-Op Procedure(s) (LRB): CORONARY ARTERY BYPASS GRAFTING (CABG) TIMES FOUR USING LEFT INTERNAL MAMMARY ARTERY AND BILATERAL ENDOSCOPICALLY HARVESTED GREATER SAPHENOUS VEINS (N/A) CLIPPING OF ATRIAL APPENDAGE USING ATRICURE ATRICLIP PRO2 (N/A) TRANSESOPHAGEAL ECHOCARDIOGRAM (TEE) (N/A) MAZE (N/A)   Events: No events  _______________________________________________________________ Vitals: BP 127/72   Pulse 72   Temp 97.8 F (36.6 C) (Oral)   Resp 12   Ht 6' (1.829 m)   Wt 111.8 kg   SpO2 95%   BMI 33.43 kg/m  Filed Weights   01/21/24 0543 01/22/24 0500 01/23/24 0600  Weight: 101.9 kg 110.3 kg 111.8 kg     - Neuro: alert NAD  - Cardiovascular: sinus   Drips: none CVP:  [11 mmHg-13 mmHg] 12 mmHg CO:  [6 L/min-7.9 L/min] 7.4 L/min CI:  [2.7 L/min/m2-3.5 L/min/m2] 3.3 L/min/m2  - Pulm: EWOB  ABG    Component Value Date/Time   PHART 7.308 (L) 01/21/2024 1930   PCO2ART 41.7 01/21/2024 1930   PO2ART 89 01/21/2024 1930   HCO3 20.8 01/21/2024 1930   TCO2 22 01/21/2024 1930   ACIDBASEDEF 5.0 (H) 01/21/2024 1930   O2SAT 96 01/21/2024 1930    - Abd: ND - Extremity: warm  .Intake/Output      02/26 0701 02/27 0700 02/27 0701 02/28 0700   I.V. (mL/kg) 100.3 (0.9)    Blood     IV Piggyback 192.5    Total Intake(mL/kg) 292.8 (2.6)    Urine (mL/kg/hr) 360 (0.1)    Blood     Chest Tube 260    Total Output 620    Net -327.3            _______________________________________________________________ Labs:    Latest Ref Rng & Units 01/23/2024    5:36 AM 01/22/2024    5:15 PM 01/22/2024    3:54 AM  CBC  WBC 4.0 - 10.5 K/uL 11.5  14.0  11.0   Hemoglobin 13.0 - 17.0 g/dL 8.6  9.0  9.1   Hematocrit 39.0 - 52.0 % 25.8  27.3  27.0   Platelets 150 - 400 K/uL 92  100  89       Latest Ref Rng & Units 01/23/2024    5:36 AM 01/22/2024    5:15 PM  01/22/2024    3:54 AM  CMP  Glucose 70 - 99 mg/dL 742  595  638   BUN 8 - 23 mg/dL 23  20  18    Creatinine 0.61 - 1.24 mg/dL 7.56  4.33  2.95   Sodium 135 - 145 mmol/L 134  137  138   Potassium 3.5 - 5.1 mmol/L 3.8  4.1  4.1   Chloride 98 - 111 mmol/L 100  104  106   CO2 22 - 32 mmol/L 21  21  21    Calcium 8.9 - 10.3 mg/dL 7.8  7.9  7.8     CXR: PV congestion  _______________________________________________________________  Assessment and Plan: POD 2 s/p CABG, LA MAZE, atriclip  Neuro: pain controlled.  Will continue to follow symptoms for now CV: starting BB and eliquis.  Will remove pacing wires Pulm: IS, ambulation Renal: creat trending down GI: on diet Heme: stable ID: afebrile Endo: SSI  Dispo: floor  Darin Mcclure 01/23/2024 8:20 AM

## 2024-01-24 LAB — CBC
HCT: 23.9 % — ABNORMAL LOW (ref 39.0–52.0)
Hemoglobin: 8 g/dL — ABNORMAL LOW (ref 13.0–17.0)
MCH: 31 pg (ref 26.0–34.0)
MCHC: 33.5 g/dL (ref 30.0–36.0)
MCV: 92.6 fL (ref 80.0–100.0)
Platelets: 91 10*3/uL — ABNORMAL LOW (ref 150–400)
RBC: 2.58 MIL/uL — ABNORMAL LOW (ref 4.22–5.81)
RDW: 14.6 % (ref 11.5–15.5)
WBC: 10.6 10*3/uL — ABNORMAL HIGH (ref 4.0–10.5)
nRBC: 0.7 % — ABNORMAL HIGH (ref 0.0–0.2)

## 2024-01-24 LAB — GLUCOSE, CAPILLARY
Glucose-Capillary: 131 mg/dL — ABNORMAL HIGH (ref 70–99)
Glucose-Capillary: 203 mg/dL — ABNORMAL HIGH (ref 70–99)
Glucose-Capillary: 153 mg/dL — ABNORMAL HIGH (ref 70–99)
Glucose-Capillary: 194 mg/dL — ABNORMAL HIGH (ref 70–99)
Glucose-Capillary: 151 mg/dL — ABNORMAL HIGH (ref 70–99)

## 2024-01-24 LAB — BPAM RBC
Blood Product Expiration Date: 202503012359
Blood Product Expiration Date: 202503082359
Blood Product Expiration Date: 202503082359
Blood Product Unit Number: 202503012359
ISSUE DATE / TIME: 202502251150
PRODUCT CODE: 202502251150
PRODUCT CODE: 202503012359
Unit Type and Rh: 9500
Unit Type and Rh: 9500
Unit Type and Rh: 9500
Unit Type and Rh: 202503012359
Unit Type and Rh: 9500
Unit Type and Rh: 9500

## 2024-01-24 LAB — TYPE AND SCREEN
ABO/RH(D): O NEG
Antibody Screen: NEGATIVE
Unit division: 0
Unit division: 0
Unit division: 0

## 2024-01-24 LAB — BASIC METABOLIC PANEL
Anion gap: 9 (ref 5–15)
BUN: 25 mg/dL — ABNORMAL HIGH (ref 8–23)
CO2: 24 mmol/L (ref 22–32)
Calcium: 8 mg/dL — ABNORMAL LOW (ref 8.9–10.3)
Chloride: 102 mmol/L (ref 98–111)
Creatinine, Ser: 1.59 mg/dL — ABNORMAL HIGH (ref 0.61–1.24)
GFR, Estimated: 43 mL/min — ABNORMAL LOW (ref >60)
Glucose, Bld: 137 mg/dL — ABNORMAL HIGH (ref 70–99)
Potassium: 4.4 mmol/L (ref 3.5–5.1)
Sodium: 135 mmol/L (ref 135–145)

## 2024-01-24 MED ORDER — INSULIN ASPART 100 UNIT/ML IJ SOLN
5.0000 [IU] | Freq: Three times a day (TID) | INTRAMUSCULAR | Status: DC
  Administered 2024-01-24 – 2024-01-26 (×5): 5 [IU] via SUBCUTANEOUS

## 2024-01-24 MED ORDER — IPRATROPIUM-ALBUTEROL 0.5-2.5 (3) MG/3ML IN SOLN: 3.0000 mL | RESPIRATORY_TRACT | Status: DC | PRN

## 2024-01-24 MED ORDER — LACTULOSE 10 GM/15ML PO SOLN
30.0000 g | Freq: Once | ORAL | Status: AC
  Administered 2024-01-24: 30 g via ORAL
  Filled 2024-01-24: qty 45

## 2024-01-24 NOTE — Plan of Care (Signed)
 Problem: Education: Goal: Ability to describe self-care measures that may prevent or decrease complications (Diabetes Survival Skills Education) will improve Outcome: Progressing Goal: Individualized Educational Video(s) Outcome: Progressing   Problem: Coping: Goal: Ability to adjust to condition or change in health will improve Outcome: Progressing   Problem: Fluid Volume: Goal: Ability to maintain a balanced intake and output will improve Outcome: Progressing   Problem: Health Behavior/Discharge Planning: Goal: Ability to identify and utilize available resources and services will improve Outcome: Progressing Goal: Ability to manage health-related needs will improve Outcome: Progressing   Problem: Metabolic: Goal: Ability to maintain appropriate glucose levels will improve Outcome: Progressing   Problem: Nutritional: Goal: Maintenance of adequate nutrition will improve Outcome: Progressing Goal: Progress toward achieving an optimal weight will improve Outcome: Progressing   Problem: Skin Integrity: Goal: Risk for impaired skin integrity will decrease Outcome: Progressing   Problem: Tissue Perfusion: Goal: Adequacy of tissue perfusion will improve Outcome: Progressing   Problem: Education: Goal: Knowledge of disease or condition will improve Outcome: Progressing Goal: Understanding of medication regimen will improve Outcome: Progressing Goal: Individualized Educational Video(s) Outcome: Progressing   Problem: Activity: Goal: Ability to tolerate increased activity will improve Outcome: Progressing   Problem: Cardiac: Goal: Ability to achieve and maintain adequate cardiopulmonary perfusion will improve Outcome: Progressing   Problem: Health Behavior/Discharge Planning: Goal: Ability to safely manage health-related needs after discharge will improve Outcome: Progressing   Problem: Education: Goal: Knowledge of General Education information will  improve Description: Including pain rating scale, medication(s)/side effects and non-pharmacologic comfort measures Outcome: Progressing   Problem: Health Behavior/Discharge Planning: Goal: Ability to manage health-related needs will improve Outcome: Progressing   Problem: Clinical Measurements: Goal: Ability to maintain clinical measurements within normal limits will improve Outcome: Progressing Goal: Will remain free from infection Outcome: Progressing Goal: Diagnostic test results will improve Outcome: Progressing Goal: Respiratory complications will improve Outcome: Progressing Goal: Cardiovascular complication will be avoided Outcome: Progressing   Problem: Activity: Goal: Risk for activity intolerance will decrease Outcome: Progressing   Problem: Nutrition: Goal: Adequate nutrition will be maintained Outcome: Progressing   Problem: Coping: Goal: Level of anxiety will decrease Outcome: Progressing   Problem: Elimination: Goal: Will not experience complications related to bowel motility Outcome: Progressing Goal: Will not experience complications related to urinary retention Outcome: Progressing   Problem: Pain Managment: Goal: General experience of comfort will improve and/or be controlled Outcome: Progressing   Problem: Safety: Goal: Ability to remain free from injury will improve Outcome: Progressing   Problem: Skin Integrity: Goal: Risk for impaired skin integrity will decrease Outcome: Progressing   Problem: Education: Goal: Understanding of CV disease, CV risk reduction, and recovery process will improve Outcome: Progressing Goal: Individualized Educational Video(s) Outcome: Progressing   Problem: Activity: Goal: Ability to return to baseline activity level will improve Outcome: Progressing   Problem: Cardiovascular: Goal: Ability to achieve and maintain adequate cardiovascular perfusion will improve Outcome: Progressing Goal: Vascular access  site(s) Level 0-1 will be maintained Outcome: Progressing   Problem: Health Behavior/Discharge Planning: Goal: Ability to safely manage health-related needs after discharge will improve Outcome: Progressing   Problem: Education: Goal: Will demonstrate proper wound care and an understanding of methods to prevent future damage Outcome: Progressing Goal: Knowledge of disease or condition will improve Outcome: Progressing Goal: Knowledge of the prescribed therapeutic regimen will improve Outcome: Progressing Goal: Individualized Educational Video(s) Outcome: Progressing   Problem: Activity: Goal: Risk for activity intolerance will decrease Outcome: Progressing   Problem: Cardiac: Goal: Will achieve and/or  maintain hemodynamic stability Outcome: Progressing   Problem: Clinical Measurements: Goal: Postoperative complications will be avoided or minimized Outcome: Progressing

## 2024-01-24 NOTE — Progress Notes (Signed)
      [address]       [place] [address]             [phone]                 3 Days Post-Op Procedure(s) (LRB): CORONARY ARTERY BYPASS GRAFTING (CABG) TIMES FOUR USING LEFT INTERNAL MAMMARY ARTERY AND BILATERAL ENDOSCOPICALLY HARVESTED GREATER SAPHENOUS VEINS (N/A) CLIPPING OF ATRIAL APPENDAGE USING ATRICURE ATRICLIP PRO2 (N/A) TRANSESOPHAGEAL ECHOCARDIOGRAM (TEE) (N/A) MAZE (N/A)   Events: No events  _______________________________________________________________ Vitals: BP 137/82 (BP Location: Left Arm)   Pulse 72   Temp 97.9 F (36.6 C) (Oral)   Resp 20   Ht 6' (1.829 m)   Wt 110.9 kg   SpO2 97%   BMI 33.16 kg/m  Filed Weights   01/22/24 0500 01/23/24 0600 01/24/24 0640  Weight: 110.3 kg 111.8 kg 110.9 kg     - Neuro: alert NAD  - Cardiovascular: sinus   Drips: none    - Pulm: EWOB  ABG    Component Value Date/Time   PHART 7.308 (L) 01/21/2024 1930   PCO2ART 41.7 01/21/2024 1930   PO2ART 89 01/21/2024 1930   HCO3 20.8 01/21/2024 1930   TCO2 22 01/23/2024 2027   ACIDBASEDEF 5.0 (H) 01/21/2024 1930   O2SAT 96 01/21/2024 1930    - Abd: ND - Extremity: warm  .Intake/Output      02/27 0701 02/28 0700 02/28 0701 03/01 0700   P.O. 240    I.V. (mL/kg) 10 (0.1) 3 (0)   IV Piggyback     Total Intake(mL/kg) 250 (2.3) 3 (0)   Urine (mL/kg/hr) 750 (0.3)    Chest Tube 50    Total Output 800    Net -550 +3           _______________________________________________________________ Labs:    Latest Ref Rng & Units 01/24/2024    3:36 AM 01/23/2024    8:27 PM 01/23/2024    5:36 AM  CBC  WBC 4.0 - 10.5 K/uL 10.6   11.5   Hemoglobin 13.0 - 17.0 g/dL 8.0  7.8  8.6   Hematocrit 39.0 - 52.0 % 23.9  23.0  25.8   Platelets 150 - 400 K/uL 91   92       Latest Ref Rng & Units 01/24/2024    3:36 AM 01/23/2024    8:27 PM 01/23/2024    5:36 AM  CMP  Glucose 70 - 99 mg/dL 914  782  956   BUN 8 - 23 mg/dL 25  23  23    Creatinine  0.61 - 1.24 mg/dL 2.13  0.86  5.78   Sodium 135 - 145 mmol/L 135  135  134   Potassium 3.5 - 5.1 mmol/L 4.4  3.7  3.8   Chloride 98 - 111 mmol/L 102  98  100   CO2 22 - 32 mmol/L 24   21   Calcium 8.9 - 10.3 mg/dL 8.0   7.8     CXR: -  _______________________________________________________________  Assessment and Plan: POD 3 s/p CABG, LA MAZE, atriclip  Neuro: pain controlled.  Will continue to follow symptoms for now CV: starting BB and eliquis.  Will remove pacing wires Pulm: IS, ambulation Renal: creat trending down GI: on diet Heme: stable ID: afebrile Endo: SSI  Dispo: floor   Glennette Galster [facility] 01/24/2024 1:06 PM

## 2024-01-24 NOTE — Progress Notes (Signed)
 CARDIAC REHAB PHASE I   Pt working with PT. Will follow-up for postop [facility] education.  Woodroe Chen, RN BSN 01/24/2024 1:48 PM

## 2024-01-24 NOTE — Inpatient Diabetes Management (Signed)
 Inpatient Diabetes Program Recommendations  AACE/ADA: New Consensus Statement on Inpatient Glycemic Control   Target Ranges:  Prepandial:   less than 140 mg/dL      Peak postprandial:   less than 180 mg/dL (1-2 hours)      Critically ill patients:  140 - 180 mg/dL    Latest Reference Range & Units 01/23/24 05:34 01/23/24 11:21 01/23/24 16:15 01/23/24 20:06 01/23/24 23:11 01/24/24 03:36 01/24/24 08:05  Glucose-Capillary 70 - 99 mg/dL 865 (H) 784 (H) 696 (H) 218 (H) 162 (H) 131 (H) 203 (H)   Review of Glycemic Control  Current orders for Inpatient glycemic control: Lantus 5 units BID, Novolog 0-24 units Q4H, Novolog 2 units TID with meals  Inpatient Diabetes Program Recommendations:    Insulin: Please consider increasing Lantus to 8 units BID.  Thanks, Orlando Penner, RN, MSN, CDCES Diabetes Coordinator Inpatient Diabetes Program [phone] (Team Pager from 8am to 5pm)

## 2024-01-24 NOTE — Progress Notes (Addendum)
 Physical Therapy Treatment Patient Details Name: Bern Fare MRN: [id number] DOB: March 16, 1941 Today's Date: 01/24/2024   History of Present Illness Mr. Dakin is an 83 y/o gentleman who presented on 2/18 with CP & DOE He was noted to be in Afib at presentation and had influenza A, which has since resolved.  LHC demonstrated diffuse disease in mid-LCX, mid-LAD, second diag, and proximal RCA. Echo with LVEF 65-70%, no RWMA.  He underwent CABG x 4 and MAZE on 2/25. HAs chest tube 2/25-2/26.  Post-MAZE Afib was resolved and he required pacing for native HR in 20-30s.  He was transferred to the ICU on meds with V-pacing.  PMH: HTN, HLD, CAD, DM    PT Comments  Pt admitted with above diagnosis. Pt was able to ambulate only a short distance and reported incr dizziness.  Pt BP orthostatic and this PT notified nurse and MD.  Pt reports he is exhaused and needs to sleep therefore assisted pt back to bed at end of treatment. Also briefly assessed pt for vertigo and treated with Epley for left BPPV.  Asked MD for Antivert or Meclizine and awaiting response as this may help pt with vertigo. Will need to continue PT with pt as he continues to be limited by dizziness.  Pt currently with functional limitations due to the deficits listed below (see PT Problem List). Pt will benefit from acute skilled PT to increase their independence and safety with mobility to allow discharge.       If plan is discharge home, recommend the following: Two people to help with walking and/or transfers;A lot of help with bathing/dressing/bathroom;Assistance with cooking/housework;Assist for transportation;Help with stairs or ramp for entrance   Can travel by private vehicle        Equipment Recommendations  None recommended by PT    Recommendations for Other Services Rehab consult     Precautions / Restrictions Precautions Precautions: Sternal;Fall Precaution Booklet Issued: Yes (comment) Recall of Precautions/Restrictions:  Impaired     Mobility  Bed Mobility Overal bed mobility: Needs Assistance Bed Mobility: Rolling, Sidelying to Sit Rolling: +2 for safety/equipment, Min assist Sidelying to sit: HOB elevated, Min assist       General bed mobility comments: Pt reports he has a bed that the head raises at home. Pt needed cues and assist to follow precautions to come to EOB.  Had trouble figuring out where to place hands/arms to sit up.    Transfers Overall transfer level: Needs assistance Equipment used: Rolling walker (2 wheels) Transfers: Sit to/from Stand Sit to Stand: +2 safety/equipment, From elevated surface, Mod assist           General transfer comment: Pt reports he has no dizziness intiially on arrival. Pt needed cues for pushing up on knees and assist to come to standing.    Ambulation/Gait Ambulation/Gait assistance: +2 safety/equipment, Min assist Gait Distance (Feet): 15 Feet Assistive device: Rolling walker (2 wheels) Gait Pattern/deviations: Step-through pattern, Decreased stride length, Wide base of support, Antalgic   Gait velocity interpretation: <1.31 ft/sec, indicative of household ambulator   General Gait Details: Pt able to walk about 10 feet and then reported that he was dizzy.   Took a few more steps and then asked to sit down. Pt reports too dizzy to continue. Moved pt back to bed and took BP and pt was orthostatic (see BP below). Assisted pt back to bed.   Stairs             Psychologist, prison and probation services  Tilt Bed    Modified Rankin (Stroke Patients Only)       Balance Overall balance assessment: Needs assistance Sitting-balance support: No upper extremity supported, Feet supported Sitting balance-Leahy Scale: Fair     Standing balance support: Bilateral upper extremity supported, During functional activity, Reliant on assistive device for balance Standing balance-Leahy Scale: Poor Standing balance comment: relies on UE support for balance                             Communication Communication Communication: No apparent difficulties  Cognition Arousal: Alert Behavior During Therapy: Flat affect   PT - Cognitive impairments: Problem solving, Safety/Judgement                       PT - Cognition Comments: Pt with some cues needed for safety with movement to follow precautions. Following commands: Intact      Cueing Cueing Techniques: Verbal cues, Tactile cues  Exercises      General Comments General comments (skin integrity, edema, etc.): BP 144/67 with HR 70 bpm supine and took BP in standing and it was 114/91 with HR 78 bpm. Once in bed, BP 133/74 with HR 76 bpm. Nurse and MD notified.  On return to bed, did assess pt for vertigo and performed Epley for left BPPV.      Pertinent Vitals/Pain Pain Assessment Pain Assessment: Faces Faces Pain Scale: Hurts little more Pain Location: incision Pain Descriptors / Indicators: Discomfort, Grimacing, Guarding Pain Intervention(s): Limited activity within patient's tolerance, Monitored during session, Repositioned    Home Living                          Prior Function            PT Goals (current goals can now be found in the care plan section) Acute Rehab PT Goals Patient Stated Goal: to get rid of dizziness Progress towards PT goals: Not progressing toward goals - comment (limited by dizziness)    Frequency    Min 1X/week      PT Plan      Co-evaluation              AM-PAC PT "6 Clicks" Mobility   Outcome Measure  Help needed turning from your back to your side while in a flat bed without using bedrails?: A Little Help needed moving from lying on your back to sitting on the side of a flat bed without using bedrails?: A Lot Help needed moving to and from a bed to a chair (including a wheelchair)?: Total Help needed standing up from a chair using your arms (e.g., wheelchair or bedside chair)?: Total Help needed to walk in  hospital room?: Total Help needed climbing 3-5 steps with a railing? : Total 6 Click Score: 9    End of Session Equipment Utilized During Treatment: Gait belt Activity Tolerance: Patient limited by fatigue (limited by dizziness/orthostasis) Patient left: in bed;with call bell/phone within reach;with bed alarm set Nurse Communication: Mobility status PT Visit Diagnosis: Muscle weakness (generalized) (M62.81);Other abnormalities of gait and mobility (R26.89)     Time: 1106-1130 PT Time Calculation (min) (ACUTE ONLY): 24 min  Charges:    $Gait Training: 8-22 mins $Canalith Rep Proc: 8-22 mins PT General Charges $$ ACUTE PT VISIT: 1 Visit  [facility] M,PT Acute Rehab Services [phone]    Bevelyn Buckles 01/24/2024, 1:26 PM

## 2024-01-25 LAB — GLUCOSE, CAPILLARY
Glucose-Capillary: 102 mg/dL — ABNORMAL HIGH (ref 70–99)
Glucose-Capillary: 107 mg/dL — ABNORMAL HIGH (ref 70–99)
Glucose-Capillary: 116 mg/dL — ABNORMAL HIGH (ref 70–99)
Glucose-Capillary: 119 mg/dL — ABNORMAL HIGH (ref 70–99)
Glucose-Capillary: 143 mg/dL — ABNORMAL HIGH (ref 70–99)
Glucose-Capillary: 147 mg/dL — ABNORMAL HIGH (ref 70–99)
Glucose-Capillary: 161 mg/dL — ABNORMAL HIGH (ref 70–99)

## 2024-01-25 MED ORDER — ACETAMINOPHEN 500 MG PO TABS
500.0000 mg | ORAL_TABLET | Freq: Four times a day (QID) | ORAL | Status: AC | PRN
Start: 2024-01-25 — End: ?

## 2024-01-25 MED ORDER — DIPHENHYDRAMINE HCL 25 MG PO CAPS
25.0000 mg | ORAL_CAPSULE | Freq: Once | ORAL | Status: AC
  Administered 2024-01-25: 25 mg via ORAL
  Filled 2024-01-25: qty 1

## 2024-01-25 MED ORDER — AMIODARONE HCL 200 MG PO TABS: 200.0000 mg | ORAL_TABLET | Freq: Every day | ORAL | 1 refills | Status: DC

## 2024-01-25 MED ORDER — DIPHENHYDRAMINE HCL 25 MG PO CAPS
25.0000 mg | ORAL_CAPSULE | Freq: Every evening | ORAL | Status: DC | PRN
  Administered 2024-01-25: 25 mg via ORAL
  Filled 2024-01-25: qty 1

## 2024-01-25 MED ORDER — TRAMADOL HCL 50 MG PO TABS: 50.0000 mg | ORAL_TABLET | Freq: Four times a day (QID) | ORAL | 0 refills | Status: DC | PRN

## 2024-01-25 MED ORDER — ATORVASTATIN CALCIUM 40 MG PO TABS: 40.0000 mg | ORAL_TABLET | Freq: Every day | ORAL | 3 refills | Status: DC

## 2024-01-25 MED ORDER — APIXABAN 2.5 MG PO TABS: 2.5000 mg | ORAL_TABLET | Freq: Two times a day (BID) | ORAL | 3 refills | Status: DC

## 2024-01-25 MED ORDER — METOPROLOL SUCCINATE ER 25 MG PO TB24: 25.0000 mg | ORAL_TABLET | Freq: Every day | ORAL | 3 refills | Status: DC

## 2024-01-25 MED ORDER — INSULIN ASPART 100 UNIT/ML IJ SOLN
0.0000 [IU] | Freq: Three times a day (TID) | INTRAMUSCULAR | Status: DC
  Administered 2024-01-25: 2 [IU] via SUBCUTANEOUS
  Administered 2024-01-26: 4 [IU] via SUBCUTANEOUS

## 2024-01-25 MED ORDER — FUROSEMIDE 40 MG PO TABS: 40.0000 mg | ORAL_TABLET | Freq: Every day | ORAL | 0 refills | Status: DC

## 2024-01-25 MED ORDER — POTASSIUM CHLORIDE CRYS ER 10 MEQ PO TBCR: 10.0000 meq | EXTENDED_RELEASE_TABLET | Freq: Every day | ORAL | 0 refills | Status: DC

## 2024-01-25 NOTE — Plan of Care (Signed)
  Problem: Fluid Volume: Goal: Ability to maintain a balanced intake and output will improve Outcome: Progressing   Problem: Coping: Goal: Ability to adjust to condition or change in health will improve Outcome: Progressing   Problem: Health Behavior/Discharge Planning: Goal: Ability to identify and utilize available resources and services will improve Outcome: Progressing Goal: Ability to manage health-related needs will improve Outcome: Progressing   Problem: Nutritional: Goal: Maintenance of adequate nutrition will improve Outcome: Progressing Goal: Progress toward achieving an optimal weight will improve Outcome: Progressing

## 2024-01-25 NOTE — Progress Notes (Signed)
 CARDIAC REHAB PHASE I   PRE:  Rate/Rhythm: 69 NSR  BP:  Sitting: 152/63      SaO2: 99  MODE:  Ambulation: 150 ft   POST:  Rate/Rhythm: 75 NSR  BP:  Sitting: 160/71      SaO2: 99  Pt tolerated exercise well and amb 150 ft with front wheel walker, and stand-by assist. Pt denies CP, SOB, or dizziness throughout walk. Pt educated on sternal precautions, sternal incision care, IS use, heart healthy diet and exercise guidelines. CRPII referral placed for [facility] location. Will continue to follow.  [id number] Guss Bunde, RRT, BSRT 01/25/2024 10:06 AM

## 2024-01-25 NOTE — Evaluation (Signed)
 Occupational Therapy Evaluation Patient Details Name: Darin Mcclure MRN: [id number] DOB: 1941-06-18 Today's Date: 01/25/2024   History of Present Illness   Darin Mcclure is an 83 y/o gentleman who presented on 2/18 with CP & DOE He was noted to be in Afib at presentation and had influenza A, which has since resolved.  LHC demonstrated diffuse disease in mid-LCX, mid-LAD, second diag, and proximal RCA. Echo with LVEF 65-70%, no RWMA.  He underwent CABG x 4 and MAZE on 2/25. HAs chest tube 2/25-2/26.  Post-MAZE Afib was resolved and he required pacing for native HR in 20-30s.  He was transferred to the ICU on meds with V-pacing.  PMH: HTN, HLD, CAD, DM     Clinical Impressions At baseline, pt is Independent with ADLs, IADLs, and functional mobility without an AD. At baseline, pt drives and volunteers in the community. Pt now presents with decreased activity tolerance, decreased knowledge of precautions; decreased knowledge of DME, decreased balance, and decreased safety and independence with functional tasks. Pt currently demonstrates ability to complete UB ADLs Independent to Min assist, LB ADLs with Contact guard to Min assist, and functional mobility/transfers with a RW with Contact guard to Min assist +2 for safety, with cues needed throughout for compensatory strategies and adhering to sternal precautions. Pt's VSS on RA this session with pt reporting no dizziness or lightheadedness. Pt will benefit from acute skilled OT services to address deficits outlined below and to increase safety and independence with functional tasks. OT currently recommends intensive inpatient skilled rehab services >3 hours per day. However, based on progress in acute rehab, pt may be able to progress to home discharge with cardiac rehab.      If plan is discharge home, recommend the following:   A little help with walking and/or transfers;A lot of help with bathing/dressing/bathroom;Assist for transportation;Help with  stairs or ramp for entrance     Functional Status Assessment   Patient has had a recent decline in their functional status and demonstrates the ability to make significant improvements in function in a reasonable and predictable amount of time.     Equipment Recommendations   BSC/3in1     Recommendations for Other Services   Rehab consult     Precautions/Restrictions   Precautions Precautions: Sternal;Fall Precaution Booklet Issued: Yes (comment) Recall of Precautions/Restrictions: Impaired (Pt able to state some (hold heart pillow when coughing, put hands on knees when standing), but requiring cues to remember others and cues during functional activities to adhere to sternal precautions.) Restrictions Weight Bearing Restrictions Per Provider Order: Yes RUE Weight Bearing Per Provider Order: Non weight bearing LUE Weight Bearing Per Provider Order: Non weight bearing     Mobility Bed Mobility               General bed mobility comments: Pt sitting in recliner at beginning and end of session    Transfers Overall transfer level: Needs assistance Equipment used: Rolling walker (2 wheels) Transfers: Bed to chair/wheelchair/BSC, Sit to/from Stand Sit to Stand: Min assist     Step pivot transfers: Contact guard assist, Min assist     General transfer comment: Min assist to power up then CGA once in standing. Pt reports no dizziness this session. Pt needed cues for pushing up on knees and Min assist to come to standing.      Balance Overall balance assessment: Needs assistance Sitting-balance support: No upper extremity supported, Feet supported Sitting balance-Leahy Scale: Fair     Standing balance support: Single  extremity supported, Bilateral upper extremity supported, During functional activity, Reliant on assistive device for balance Standing balance-Leahy Scale: Poor Standing balance comment: relies on UE support for balance                            ADL either performed or assessed with clinical judgement   ADL Overall ADL's : Needs assistance/impaired Eating/Feeding: Independent;Sitting   Grooming: Contact guard assist;Sitting;Cueing for compensatory techniques (cues to adhere to sternal precautions)   Upper Body Bathing: Minimal assistance;Sitting;Cueing for compensatory techniques;Cueing for safety (cues to adhere to sternal precautions)   Lower Body Bathing: Minimal assistance;Moderate assistance;Sitting/lateral leans;Sit to/from stand;Cueing for compensatory techniques;Cueing for safety (cues to adhere to sternal precautions)   Upper Body Dressing : Contact guard assist;Sitting;Cueing for compensatory techniques (cues to adhere to sternal precautions)   Lower Body Dressing: Minimal assistance;Moderate assistance;Sitting/lateral leans;Sit to/from stand;Cueing for compensatory techniques (cues to adhere to sternal precautions)   Toilet Transfer: Minimal assistance;BSC/3in1;Rolling walker (2 wheels);Contact guard assist (step-pivot transfer; cues to adhere to sternal precautions) Toilet Transfer Details (indicate cue type and reason): Min assist to power up then CGA once in standing; simulated at recliner Toileting- Clothing Manipulation and Hygiene: Minimal assistance;Cueing for compensatory techniques;Sitting/lateral lean;Sit to/from stand (cues to adhere to sternal precautions)       Functional mobility during ADLs: Contact guard assist;Minimal assistance;+2 for safety/equipment;Rolling walker (2 wheels) (Min assist to power up to stand then CGA once in standing; +2 assist recommended for safety to progress functional mobility due to recent (01/24/24) vertigo and orthostatic hypotension, though neither present during this session.) General ADL Comments: Pt with decreased activity tolerance, fatiguing quickly during functional activities. OT initiated training in techniques for adhering to sternal precautions while  completing ADLs and functional transfers with a RW with pt verbalizing understanding but requiring cues throughout to adhere to precautions and for use of compensatory strategies.     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 and mild far-sightedness in the other, but reports he does not wear glasses/contacts.) Ability to See in Adequate Light: 0 Adequate (for tasks assessed this session) Patient Visual Report: No change from baseline       Perception         Praxis         Pertinent Vitals/Pain Pain Assessment Pain Assessment: Faces Faces Pain Scale: Hurts little more Pain Location: chest incision when coughing; no pain at rest or with activity Pain Descriptors / Indicators: Discomfort, Grimacing, Guarding Pain Intervention(s): Monitored during session, Other (comment) (Pt held heart pillow when coughing)     Extremity/Trunk Assessment Upper Extremity Assessment Upper Extremity Assessment: Right hand dominant;Overall WFL for tasks assessed (tested within sternal precautions)   Lower Extremity Assessment Lower Extremity Assessment: Defer to PT evaluation   Cervical / Trunk Assessment Cervical / Trunk Assessment: Normal   Communication Communication Communication: No apparent difficulties   Cognition Arousal: Alert Behavior During Therapy: WFL for tasks assessed/performed Cognition: Cognition impaired     Awareness: Intellectual awareness intact, Online awareness impaired   Attention impairment (select first level of impairment): Alternating attention Executive functioning impairment (select all impairments): Problem solving, Organization OT - Cognition Comments: Pt AAOx4 and plesant throughout session. Cognition largely [facility] for tasks assessed with mild deficits as noted above. Pt requiring cues to rember all sternal precautions and cues to adhere to sternal precautions during functional tasks.  Following commands: Intact       Cueing  General Comments   Cueing Techniques: Verbal cues;Tactile cues   VSS on RA throughout session.    Exercises     Shoulder Instructions      Home Living Family/patient expects to be discharged to:: Private residence Living Arrangements: Spouse/significant other Available Help at Discharge: Family;Available 24 hours/day Type of Home: House Home Access: Stairs to enter [employer] of Steps: 4 Entrance Stairs-Rails: Right Home Layout: Two level;Able to live on main level with bedroom/bathroom     Bathroom Shower/Tub: Producer, television/film/video: Standard Bathroom Accessibility: Yes How Accessible: Accessible via walker Home Equipment: Shower seat - built [employer] fundraiser (2 wheels) (Pt previously had BSC after knee sx several years ago, but it was given to neighbors and pt unsure if it can be returned to his home prior to discharge)          Prior Functioning/Environment Prior Level of Function : Independent/Modified Independent;Driving             Mobility Comments: Independent; no falls ADLs Comments: Independent with ADLs, IADLs, and drives. 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    OT Problem List: Decreased strength;Decreased activity tolerance;Impaired balance (sitting and/or standing);Decreased knowledge of use of DME or AE;Decreased knowledge of precautions   OT Treatment/Interventions: Self-care/ADL training;Energy conservation;DME and/or AE instruction;Therapeutic activities;Patient/family education;Balance training      OT Goals(Current goals can be found in the care plan section)   Acute Rehab OT Goals Patient Stated Goal: to heal well, remain independent, and return home with wife OT Goal Formulation: With patient Time For Goal Achievement: 02/08/24 Potential to Achieve Goals: Good ADL Goals Pt Will Perform Grooming: with modified independence;standing (adhering to sternal  precautions) Pt Will Perform Upper Body Bathing: with supervision;sitting (adhering to sternal precautions) Pt Will Perform Upper Body Dressing: with modified independence;sitting (adhering to sternal precautions) Pt Will Perform Lower Body Dressing: with supervision;sit to/from stand;sitting/lateral leans (adhering to sternal precautions) Pt Will Transfer to Toilet: with modified independence;ambulating;regular height toilet (with least restrictive AD and adhering to sternal precautions) Pt Will Perform Toileting - Clothing Manipulation and hygiene: with modified independence;sitting/lateral leans;sit to/from stand (adhering to sternal precautions) Additional ADL Goal #1: Patient will demonstrate ability to Independently state 4 energy conservation strategies to increase safety and independence with functional activities in the home.   OT Frequency:  Min 1X/week    Co-evaluation              AM-PAC OT "6 Clicks" Daily Activity     Outcome Measure Help from another person eating meals?: None Help from another person taking care of personal grooming?: A Little Help from another person toileting, which includes using toliet, bedpan, or urinal?: A Little Help from another person bathing (including washing, rinsing, drying)?: A Lot Help from another person to put on and taking off regular upper body clothing?: A Little Help from another person to put on and taking off regular lower body clothing?: A Lot 6 Click Score: 17   End of Session Equipment Utilized During Treatment: Rolling walker (2 wheels);Other (comment) (heart pillow) Nurse Communication: Mobility status  Activity Tolerance: Patient tolerated treatment well Patient left: in chair;with call bell/phone within reach  OT Visit Diagnosis: Unsteadiness on feet (R26.81);Other abnormalities of gait and mobility (R26.89);Other (comment) (decreased activity tolerance)                Time: 5409-8119 OT Time Calculation (min): 17  min  Charges:  OT General Charges $OT Visit: 1 Visit OT Evaluation $OT Eval Low Complexity: 1 Low  Lonzy Mato "Kyle" M., OTR/L, MA Acute Rehab [phone]  Lendon Colonel 01/25/2024, 12:51 PM

## 2024-01-25 NOTE — Progress Notes (Addendum)
      [address]       [place] [address]             [phone]       4 Days Post-Op Procedure(s) (LRB): CORONARY ARTERY BYPASS GRAFTING (CABG) TIMES FOUR USING LEFT INTERNAL MAMMARY ARTERY AND BILATERAL ENDOSCOPICALLY HARVESTED GREATER SAPHENOUS VEINS (N/A) CLIPPING OF ATRIAL APPENDAGE USING ATRICURE ATRICLIP PRO2 (N/A) TRANSESOPHAGEAL ECHOCARDIOGRAM (TEE) (N/A) MAZE (N/A)  Subjective:  Patient sitting up chair.  He has ambulated and moved his bowels.  He states his hallucinations are improved. Has some complaints of neck pain from where his neck line was located.  Objective: Vital signs in last 24 hours: Temp:  [97.7 F (36.5 C)-98.3 F (36.8 C)] 98.3 F (36.8 C) (03/01 0813) Pulse Rate:  [64-72] 72 (03/01 0813) Cardiac Rhythm: Normal sinus rhythm (02/28 2100) Resp:  [15-20] 15 (03/01 0437) BP: (110-152)/(54-82) 152/63 (03/01 0813) SpO2:  [91 %-97 %] 96 % (03/01 0813) Weight:  [102 kg] 111 kg (03/01 0437)  Intake/Output from previous day: 02/28 0701 - 03/01 0700 In: 6 [I.V.:6] Out: -   General appearance: alert, cooperative, and no distress Heart: regular rate and rhythm Lungs: clear to auscultation bilaterally Abdomen: soft, non-tender; bowel sounds normal; no masses,  no organomegaly Extremities: edema trace Wound: clean and dry, ecchymosis LLE  Lab Results: Recent Labs    01/23/24 0536 01/23/24 2027 01/24/24 0336  WBC 11.5*  --  10.6*  HGB 8.6* 7.8* 8.0*  HCT 25.8* 23.0* 23.9*  PLT 92*  --  91*   BMET:  Recent Labs    01/23/24 0536 01/23/24 2027 01/24/24 0336  NA 134* 135 135  K 3.8 3.7 4.4  CL 100 98 102  CO2 21*  --  24  GLUCOSE 197* 195* 137*  BUN 23 23 25*  CREATININE 1.76* 1.60* 1.59*  CALCIUM 7.8*  --  8.0*    PT/INR: No results for input(s): "LABPROT", "INR" in the last 72 hours. ABG    Component Value Date/Time   PHART 7.308 (L) 01/21/2024 1930   HCO3 20.8 01/21/2024 1930   TCO2 22 01/23/2024 2027    ACIDBASEDEF 5.0 (H) 01/21/2024 1930   O2SAT 96 01/21/2024 1930   CBG (last 3)  Recent Labs    01/24/24 2020 01/25/24 0028 01/25/24 0439  GLUCAP 151* 107* 119*    Assessment/Plan: S/P Procedure(s) (LRB): CORONARY ARTERY BYPASS GRAFTING (CABG) TIMES FOUR USING LEFT INTERNAL MAMMARY ARTERY AND BILATERAL ENDOSCOPICALLY HARVESTED GREATER SAPHENOUS VEINS (N/A) CLIPPING OF ATRIAL APPENDAGE USING ATRICURE ATRICLIP PRO2 (N/A) TRANSESOPHAGEAL ECHOCARDIOGRAM (TEE) (N/A) MAZE (N/A)  CV- NSR, BP is stable- continue Amiodarone, lopressor.. will convert to Toprol XL Pulm- no acute issues, continue IS... CXR with trace effusions/atelectasis Renal- H/O CKD Stage 3a- 1.30 on admission, creatinine stable at 1.59 Expected post operative blood loss anemia, hgb at 8.0, not clinically significant DM- sugars stable continue current care Post operative thrombocytopenia... count stable at 90K Dispo- patient doing well, continue current care.. if remains stable will likely be ready for d/c in AM   LOS: 10 days   Darin Dandy, PA-C 01/25/2024  Agree with above Dispo planning  Darin Mcclure O Novella Abraha

## 2024-01-25 NOTE — Plan of Care (Signed)
 Problem: Education: Goal: Ability to describe self-care measures that may prevent or decrease complications (Diabetes Survival Skills Education) will improve Outcome: Progressing Goal: Individualized Educational Video(s) Outcome: Progressing   Problem: Coping: Goal: Ability to adjust to condition or change in health will improve Outcome: Progressing   Problem: Fluid Volume: Goal: Ability to maintain a balanced intake and output will improve Outcome: Progressing   Problem: Health Behavior/Discharge Planning: Goal: Ability to identify and utilize available resources and services will improve Outcome: Progressing Goal: Ability to manage health-related needs will improve Outcome: Progressing   Problem: Metabolic: Goal: Ability to maintain appropriate glucose levels will improve Outcome: Progressing   Problem: Nutritional: Goal: Maintenance of adequate nutrition will improve Outcome: Progressing Goal: Progress toward achieving an optimal weight will improve Outcome: Progressing   Problem: Skin Integrity: Goal: Risk for impaired skin integrity will decrease Outcome: Progressing   Problem: Tissue Perfusion: Goal: Adequacy of tissue perfusion will improve Outcome: Progressing   Problem: Education: Goal: Knowledge of disease or condition will improve Outcome: Progressing Goal: Understanding of medication regimen will improve Outcome: Progressing Goal: Individualized Educational Video(s) Outcome: Progressing   Problem: Activity: Goal: Ability to tolerate increased activity will improve Outcome: Progressing   Problem: Cardiac: Goal: Ability to achieve and maintain adequate cardiopulmonary perfusion will improve Outcome: Progressing   Problem: Health Behavior/Discharge Planning: Goal: Ability to safely manage health-related needs after discharge will improve Outcome: Progressing   Problem: Education: Goal: Knowledge of General Education information will  improve Description: Including pain rating scale, medication(s)/side effects and non-pharmacologic comfort measures Outcome: Progressing   Problem: Health Behavior/Discharge Planning: Goal: Ability to manage health-related needs will improve Outcome: Progressing   Problem: Clinical Measurements: Goal: Ability to maintain clinical measurements within normal limits will improve Outcome: Progressing Goal: Will remain free from infection Outcome: Progressing Goal: Diagnostic test results will improve Outcome: Progressing Goal: Respiratory complications will improve Outcome: Progressing Goal: Cardiovascular complication will be avoided Outcome: Progressing   Problem: Activity: Goal: Risk for activity intolerance will decrease Outcome: Progressing   Problem: Nutrition: Goal: Adequate nutrition will be maintained Outcome: Progressing   Problem: Coping: Goal: Level of anxiety will decrease Outcome: Progressing   Problem: Elimination: Goal: Will not experience complications related to bowel motility Outcome: Progressing Goal: Will not experience complications related to urinary retention Outcome: Progressing   Problem: Pain Managment: Goal: General experience of comfort will improve and/or be controlled Outcome: Progressing   Problem: Safety: Goal: Ability to remain free from injury will improve Outcome: Progressing   Problem: Skin Integrity: Goal: Risk for impaired skin integrity will decrease Outcome: Progressing   Problem: Education: Goal: Understanding of CV disease, CV risk reduction, and recovery process will improve Outcome: Progressing Goal: Individualized Educational Video(s) Outcome: Progressing   Problem: Activity: Goal: Ability to return to baseline activity level will improve Outcome: Progressing   Problem: Cardiovascular: Goal: Ability to achieve and maintain adequate cardiovascular perfusion will improve Outcome: Progressing Goal: Vascular access  site(s) Level 0-1 will be maintained Outcome: Progressing   Problem: Health Behavior/Discharge Planning: Goal: Ability to safely manage health-related needs after discharge will improve Outcome: Progressing   Problem: Education: Goal: Will demonstrate proper wound care and an understanding of methods to prevent future damage Outcome: Progressing Goal: Knowledge of disease or condition will improve Outcome: Progressing Goal: Knowledge of the prescribed therapeutic regimen will improve Outcome: Progressing Goal: Individualized Educational Video(s) Outcome: Progressing   Problem: Activity: Goal: Risk for activity intolerance will decrease Outcome: Progressing   Problem: Cardiac: Goal: Will achieve and/or  maintain hemodynamic stability Outcome: Progressing   Problem: Clinical Measurements: Goal: Postoperative complications will be avoided or minimized Outcome: Progressing

## 2024-01-26 DIAGNOSIS — Z1371 Encounter for nonprocreative screening for genetic disease carrier status: Secondary | ICD-10-CM

## 2024-01-26 DIAGNOSIS — Z9889 Other specified postprocedural states: Secondary | ICD-10-CM

## 2024-01-26 DIAGNOSIS — Z951 Presence of aortocoronary bypass graft: Secondary | ICD-10-CM

## 2024-01-26 LAB — GLUCOSE, CAPILLARY
Glucose-Capillary: 121 mg/dL — ABNORMAL HIGH (ref 70–99)
Glucose-Capillary: 175 mg/dL — ABNORMAL HIGH (ref 70–99)

## 2024-01-26 MED ORDER — DIPHENHYDRAMINE HCL 25 MG PO CAPS: 25.0000 mg | ORAL_CAPSULE | Freq: Every evening | ORAL | Status: DC | PRN

## 2024-01-26 NOTE — Progress Notes (Signed)
 Discharge instructions given to the patient and family, PIV removed, CCMD notified, belongings taken by his family, all the questions answered.

## 2024-01-26 NOTE — Progress Notes (Signed)
      [address]       [place] [address]             [phone]      5 Days Post-Op Procedure(s) (LRB): CORONARY ARTERY BYPASS GRAFTING (CABG) TIMES FOUR USING LEFT INTERNAL MAMMARY ARTERY AND BILATERAL ENDOSCOPICALLY HARVESTED GREATER SAPHENOUS VEINS (N/A) CLIPPING OF ATRIAL APPENDAGE USING ATRICURE ATRICLIP PRO2 (N/A) TRANSESOPHAGEAL ECHOCARDIOGRAM (TEE) (N/A) MAZE (N/A)  Subjective:  Patient is sitting up in chair.  His hallucinations have resolved.  He feels up to going home.  + ambulation  + BM  Objective: Vital signs in last 24 hours: Temp:  [97.8 F (36.6 C)-98.3 F (36.8 C)] 98.1 F (36.7 C) (03/02 0809) Pulse Rate:  [61-72] 72 (03/02 0811) Cardiac Rhythm: Normal sinus rhythm (03/01 1945) Resp:  [16-20] 20 (03/02 0809) BP: (113-131)/(51-68) 115/51 (03/02 0811) SpO2:  [96 %-98 %] 97 % (03/02 0809) Weight:  [109.5 kg] 109.5 kg (03/02 0022)  Intake/Output from previous day: 03/01 0701 - 03/02 0700 In: 720 [P.O.:720] Out: -  Intake/Output this shift: Total I/O In: 240 [P.O.:240] Out: -   General appearance: alert, cooperative, and no distress Heart: regular rate and rhythm Lungs: clear to auscultation bilaterally Abdomen: soft, non-tender; bowel sounds normal; no masses,  no organomegaly Extremities: extremities normal, atraumatic, no cyanosis or edema Wound: clean and dry  Lab Results: Recent Labs    01/23/24 2027 01/24/24 0336  WBC  --  10.6*  HGB 7.8* 8.0*  HCT 23.0* 23.9*  PLT  --  91*   BMET:  Recent Labs    01/23/24 2027 01/24/24 0336  NA 135 135  K 3.7 4.4  CL 98 102  CO2  --  24  GLUCOSE 195* 137*  BUN 23 25*  CREATININE 1.60* 1.59*  CALCIUM  --  8.0*    PT/INR: No results for input(s): "LABPROT", "INR" in the last 72 hours. ABG    Component Value Date/Time   PHART 7.308 (L) 01/21/2024 1930   HCO3 20.8 01/21/2024 1930   TCO2 22 01/23/2024 2027   ACIDBASEDEF 5.0 (H) 01/21/2024 1930   O2SAT 96  01/21/2024 1930   CBG (last 3)  Recent Labs    01/25/24 2122 01/26/24 0551 01/26/24 0807  GLUCAP 102* 121* 175*    Assessment/Plan: S/P Procedure(s) (LRB): CORONARY ARTERY BYPASS GRAFTING (CABG) TIMES FOUR USING LEFT INTERNAL MAMMARY ARTERY AND BILATERAL ENDOSCOPICALLY HARVESTED GREATER SAPHENOUS VEINS (N/A) CLIPPING OF ATRIAL APPENDAGE USING ATRICURE ATRICLIP PRO2 (N/A) TRANSESOPHAGEAL ECHOCARDIOGRAM (TEE) (N/A) MAZE (N/A)  CV- Sinus Bradycardia, BP well controlled-continue Amiodarone, Lopressor Pulm- no acute issues, not on oxygen, continue IS Renal- h/O CKD Stage 3, creatinine has been stable  DM- sugars are stable, will resume home medications Dispo- patient stable, will d/c home today   LOS: 11 days   Lowella Dandy, PA-C 01/26/2024

## 2024-01-28 DIAGNOSIS — I509 Heart failure, unspecified: Secondary | ICD-10-CM | POA: Diagnosis not present

## 2024-01-28 DIAGNOSIS — Z48812 Encounter for surgical aftercare following surgery on the circulatory system: Secondary | ICD-10-CM | POA: Diagnosis not present

## 2024-01-29 ENCOUNTER — Other Ambulatory Visit: Payer: Self-pay | Admitting: *Deleted

## 2024-01-29 ENCOUNTER — Telehealth: Payer: Self-pay | Admitting: *Deleted

## 2024-01-29 DIAGNOSIS — Z951 Presence of aortocoronary bypass graft: Secondary | ICD-10-CM

## 2024-01-29 NOTE — Telephone Encounter (Signed)
 Patient contacted the office regarding diarrhea, dry cough, and inability to sleep. Patient states yesterday he had unexpected diarrhea. States episodes came out of no where and without any abdominal discomfort or cramping. States today he has been passing gas without diarrhea. Advised patient to avoid stool softeners at this time and to eat a bland diet to see if that helps with episodes. Patient states main complaint is a dry cough that prevents him from sleeping. States he is using IS but not as frequently as he should. States he is becoming more SOB with ambulation. Denies fever, weight gain, or extremity swelling. States he is still taking lasix. Advised patient cough could be related to pleural effusion. Advised CXR order can be placed for assessment. Patient agreeable to get CXR. States he will go either today or tomorrow. Advised patient to contact our office once complete so imaging can be reviewed. Advised patient to brace when coughing and to continue to use IS. Advised patient may take melatonin for sleep as he does not like benadryl. Patient verbalized understanding.

## 2024-01-30 ENCOUNTER — Other Ambulatory Visit: Payer: Self-pay

## 2024-01-30 ENCOUNTER — Ambulatory Visit
Admission: RE | Admit: 2024-01-30 | Discharge: 2024-01-30 | Disposition: A | Discharge status: Home / Self Care | Source: Ambulatory Visit | Attending: Thoracic Surgery (Cardiothoracic Vascular Surgery) | Admitting: Thoracic Surgery (Cardiothoracic Vascular Surgery)

## 2024-01-30 ENCOUNTER — Other Ambulatory Visit: Payer: Self-pay | Admitting: Thoracic Surgery (Cardiothoracic Vascular Surgery)

## 2024-01-30 DIAGNOSIS — Z951 Presence of aortocoronary bypass graft: Secondary | ICD-10-CM

## 2024-01-30 MED ORDER — FUROSEMIDE 40 MG PO TABS: 40.0000 mg | ORAL_TABLET | Freq: Every day | ORAL | 0 refills | Status: DC

## 2024-01-30 NOTE — Progress Notes (Signed)
 Patient contacted the office back with concerns about waiting until Monday for thoracentesis. He is concerned about worsening shortness of breath during the wait time until procedure. Per Dr. Dorris Fetch, patient is to take Lasix 40 mg once daily until his Thoracentesis on Monday, 3/10. Prescription sent into patient's preferred pharmacy, [facility] in [place]. Patient aware and acknowledged receipt.

## 2024-01-31 ENCOUNTER — Ambulatory Visit (HOSPITAL_COMMUNITY)
Admission: RE | Admit: 2024-01-31 | Discharge: 2024-01-31 | Disposition: A | Discharge status: Home / Self Care | Source: Ambulatory Visit | Attending: Radiology | Admitting: Radiology

## 2024-01-31 ENCOUNTER — Ambulatory Visit (HOSPITAL_COMMUNITY)
Admission: RE | Admit: 2024-01-31 | Discharge: 2024-01-31 | Disposition: A | Discharge status: Home / Self Care | Source: Ambulatory Visit | Attending: Thoracic Surgery (Cardiothoracic Vascular Surgery) | Admitting: Thoracic Surgery (Cardiothoracic Vascular Surgery)

## 2024-01-31 ENCOUNTER — Encounter: Payer: Federal, State, Local not specified - PPO | Admitting: Thoracic Surgery (Cardiothoracic Vascular Surgery)

## 2024-01-31 VITALS — BP 115/68

## 2024-01-31 DIAGNOSIS — J90 Pleural effusion, not elsewhere classified: Secondary | ICD-10-CM | POA: Insufficient documentation

## 2024-01-31 DIAGNOSIS — Z951 Presence of aortocoronary bypass graft: Secondary | ICD-10-CM | POA: Diagnosis present

## 2024-01-31 MED ORDER — LIDOCAINE HCL 1 % IJ SOLN
INTRAMUSCULAR | Status: AC
Start: 2024-01-31 — End: ?
  Filled 2024-01-31: qty 20

## 2024-01-31 NOTE — Procedures (Signed)
 Ultrasound-guided  therapeutic left  thoracentesis performed yielding 1.3 liters of dark, bloody fluid. No immediate complications. Follow-up chest x-ray pending. EBL< 2 cc.

## 2024-02-03 ENCOUNTER — Other Ambulatory Visit (HOSPITAL_COMMUNITY)

## 2024-02-05 ENCOUNTER — Telehealth: Payer: Self-pay | Admitting: *Deleted

## 2024-02-05 ENCOUNTER — Other Ambulatory Visit: Payer: Self-pay | Admitting: *Deleted

## 2024-02-05 MED ORDER — METOPROLOL SUCCINATE ER 25 MG PO TB24: 50.0000 mg | ORAL_TABLET | Freq: Every day | ORAL | 3 refills | Status: DC

## 2024-02-05 MED ORDER — POTASSIUM CHLORIDE CRYS ER 10 MEQ PO TBCR: 10.0000 meq | EXTENDED_RELEASE_TABLET | Freq: Every day | ORAL | 0 refills | Status: DC

## 2024-02-05 NOTE — Telephone Encounter (Signed)
 Alferd Apa, RN with [facility], contacted the office regarding feet swelling, insomnia, and increased HR. Per RN, patient states feet have become increasingly more swollen since stopping lasix on Saturday. States he was provided 3 additional pills to take prior to his thoracentesis but did not take as procedure was moved up. Denies weight gain or SOB. RN states HR during exam was 120 and elevated to 130 during visit. Patient denies palpitations or feeling as if heart is racing. Does not have the ability to check VS while at home. States BP during today's HH visit was 132/76. Discussed with PA W. Doylene Canning. Advised patient to continue lasix and potassium for additional three days. Refill of potassium sent to pharmacy. Advised patient to increase metoprolol to 50mg  daily. Advised patient he may take melatonin for sleep. Patient is to follow up with Dr. Cliffton Asters Friday.

## 2024-02-06 ENCOUNTER — Other Ambulatory Visit: Payer: Self-pay | Admitting: Thoracic Surgery (Cardiothoracic Vascular Surgery)

## 2024-02-06 ENCOUNTER — Telehealth (HOSPITAL_COMMUNITY): Payer: Self-pay

## 2024-02-06 DIAGNOSIS — Z951 Presence of aortocoronary bypass graft: Secondary | ICD-10-CM

## 2024-02-06 NOTE — Telephone Encounter (Signed)
 Pt insurance is active and benefits verified through [employer]. Co-pay $0.00, DED $350.00/$350.00 met, out of pocket $6,000.00/$1,176.46 met, co-insurance 35%. No pre-authorization required. [employer], 02/06/24 @ 11:29AM, REF#[id number]   How many CR sessions are covered? (36 visits for TCR, 72 visits for ICR)72 Is this a lifetime maximum or an annual maximum? Annual Has the member used any of these services to date? No Is there a time limit (weeks/months) on start of program and/or program completion? No     Will contact patient to see if he is interested in the Cardiac Rehab Program. If interested, patient will need to complete follow up appt. Once completed, patient will be contacted for scheduling upon review by the RN Navigator.

## 2024-02-06 NOTE — Telephone Encounter (Signed)
 Attempted to call patient in regards to Cardiac Rehab - LM on VM

## 2024-02-07 ENCOUNTER — Other Ambulatory Visit: Payer: Self-pay

## 2024-02-07 ENCOUNTER — Telehealth: Payer: Federal, State, Local not specified - PPO | Admitting: Thoracic Surgery (Cardiothoracic Vascular Surgery)

## 2024-02-07 ENCOUNTER — Encounter: Payer: Self-pay | Admitting: Thoracic Surgery (Cardiothoracic Vascular Surgery)

## 2024-02-07 ENCOUNTER — Ambulatory Visit (INDEPENDENT_AMBULATORY_CARE_PROVIDER_SITE_OTHER): Payer: Self-pay | Admitting: Thoracic Surgery (Cardiothoracic Vascular Surgery)

## 2024-02-07 ENCOUNTER — Ambulatory Visit
Admission: RE | Admit: 2024-02-07 | Discharge: 2024-02-07 | Disposition: A | Discharge status: Home / Self Care | Source: Ambulatory Visit | Attending: Thoracic Surgery (Cardiothoracic Vascular Surgery) | Admitting: Thoracic Surgery (Cardiothoracic Vascular Surgery)

## 2024-02-07 VITALS — BP 121/80 | HR 123 | Resp 20 | Ht 72.0 in | Wt 229.3 lb

## 2024-02-07 DIAGNOSIS — Z951 Presence of aortocoronary bypass graft: Secondary | ICD-10-CM

## 2024-02-07 MED ORDER — FUROSEMIDE 40 MG PO TABS: 40.0000 mg | ORAL_TABLET | Freq: Every day | ORAL | 0 refills | Status: DC

## 2024-02-07 NOTE — Progress Notes (Signed)
      [address]       [place] [address]             [phone]        [facility] Medical Record #[id number] Date of Birth: 1940/12/10  Referring: Jake Bathe, MD Primary Care: Eartha Inch, MD Primary Cardiologist:Mark Anne Fu, MD  Reason for visit:   follow-up  History of Present Illness:     Overall he is doing well.  He had a liter fluid drained off his left chest with improvement shortness of breath.  He still has some exertional dyspnea.  He does feel much better.  Physical Exam: BP 121/80 (BP Location: Right Arm, Patient Position: Sitting, Cuff Size: Normal)   Pulse (!) 123   Resp 20   Ht 6' (1.829 m)   Wt 229 lb 4.8 oz (104 kg)   SpO2 97% Comment: RA  BMI 31.10 kg/m   Alert NAD Incision clean.  Sternum stable Abdomen, ND Trace peripheral edema   Diagnostic Studies & Laboratory data: CXR: Moderate left effusion     Assessment / Plan:   83 year old male status post CABG.  He still has a left-sided pleural effusion, but his symptoms are much improved.  I have continued is Lasix prescription for 1 more week.  I will touch base with him to determine if he needs another thoracentesis.   Corliss Skains 02/07/2024 1:48 PM

## 2024-02-07 NOTE — Progress Notes (Addendum)
 Cardiology Office Note:  .   Date:  02/10/2024  ID:  Darin Mcclure, DOB 12/30/1940, MRN [id number] PCP: Darin Inch, MD  [facility] Providers Cardiologist:  Darin Schultz, MD    Patient Profile: .      PMH Coronary artery disease DES to RCA 09/2016 Repeat cardiac cath 2009 showed patent stent in RCA, moderate disease involving LAD and diagonal branches Stress test 01/12/2021 - low risk no ischemia LHC 01/15/2024 Proximal RCA 1 lesion 80 Proximal RCA to lesion 30 Mid RCA 30 Mid Cx 90 Second marginal 90 Mid LAD 80 Distal LAD 70 Second diagonal 95 Stent in mRCA with minimal restenosis Normal LVEDP S/p CABG x 4 with MAZE and LA clip on 01/21/24 Pure hypercholesterolemia Hypertriglyceridemia Atrial fibrillation Type 2 diabetes mellitus Peripheral neuropathy Carotid artery disease Right bundle branch block Hypertension Mitral valve stenosis Pleural effusion  History of coronary artery disease with DES (Cypher 3.0) to mid RCA, ostium of left main coronary artery normal by IVUS in November 2007.  Subsequent catheterization 09/29/2008 revealed patent RCA stent, EF 70%, moderate coronary artery disease involving the LAD, diagonal, and circumflex branches.  Stress test 11/2014 showed mild small area of infarct in the inferior wall, LV function abnormal and mid inferior and basal inferior wall with EF 50%.  Carotid duplex showed no significant stenosis bilaterally.  A calcified stable plaque of the carotid bulbs mostly on the left.   He established care with Dr. Anne Mcclure in 2019 after moving from [place].  He reported past medical history of diabetes with peripheral neuropathy, hyperlipidemia, and hypertension.  He retired from [employer] after 50 years and moved to [place] to be with his grandchildren.  Last cardiology clinic visit was 01/16/2024 at which time he reported chronic back pain that somewhat limited his activity.  He was looking to lose weight and wanted to work  with a Systems analyst that is also a nutritionist.  He was not having any concerning cardiac symptoms and was recommended to follow-up in 1 year.  He presented to the emergency department on 01/14/2024 with atrial fibrillation. Found to have influenza A. He had recently had symptoms of URI treated with course of antibiotics and steroids completed on 01/13/2024.  On DOA, he was having shortness of breath and chest pain. Pain was midsternal and did not radiate but did improve with rest.  He also had increasing fatigue.  Upon arrival he was found to be in atrial fibrillation with intermittent episodes of RVR with heart rate up to the 150s, RBBB. Cardiology was consulted.  BNP was 66. Hs troponin 94 >>94. Echo revealed normal LV EF, no RWMA, mod LVH, normal RV, mild to mod MS.  Postop course complicated by anemia and did require postop transfusion. Noted to have mild thrombocytopenia.  Triglycerides were elevated at 332, LDL 16. Creatinine increased to 1.81 postop but did improve to 1.59 at time of discharge. He was discharged on 01/26/2024.  He underwent left thoracentesis on 01/31/2024.        History of Present Illness: Darin Mcclure   Darin Mcclure is a very pleasant 83 y.o. male who is here today for hospital follow-up s/p CABG. He is accompanied by his wife. He reports he is having increased shortness of breath since thoracentesis on 01/31/24. He felt significant improvement in his breathing following the thoracentesis but SOB has gradually worsened since that time. Walking into the office from the care today was the most he has exerted himself since surgery.  He is not aware that his heart rate is elevated, no palpitations.  He reports pain in left chest with coughing.  Otherwise no chest pain.  He has bilateral LE edema that he reports has improved but still persists.  Reports that during hospitalization, he did not feel a AF RVR.  PT has come to his home but he has had difficulty completing their assigned exercises.  Overall is sleeping well, but had difficulty getting comfortable last night. He denies orthopnea or PND. No concerning side effects with medications. He has several questions regarding some of the changes in the medications. Has noted HR 60-80 bpm at rest consistently.   Discussed the use of AI scribe software for clinical note transcription with the patient, who gave verbal consent to proceed.   ROS: See HPI       Studies Reviewed: Darin Mcclure   EKG Interpretation Date/Time:  Monday February 10 2024 08:31:39 EDT Ventricular Rate:  118 PR Interval:  140 QRS Duration:  158 QT Interval:  388 QTC Calculation: 543 R Axis:   58  Text Interpretation: Sinus tachycardia Indeterminate axis Right bundle branch block When compared with ECG of 22-Jan-2024 06:58, Vent. rate has increased BY  67 BPM Nonspecific T wave abnormality has replaced inverted T waves in Inferior leads Nonspecific T wave abnormality, worse in Anterior leads Confirmed by Eligha Bridegroom [phone]) on 02/10/2024 8:38:29 AM     No results found for: "LIPOA"    Risk Assessment/Calculations:    CHA2DS2-VASc Score = 5   This indicates a 7.2% annual risk of stroke. The patient's score is based upon: CHF History: 0 HTN History: 1 Diabetes History: 1 Stroke History: 0 Vascular Disease History: 1 Age Score: 2 Gender Score: 0            Physical Exam:   VS:  BP 116/60   Pulse (!) 118   Ht 6' (1.829 m)   Wt 225 lb 9.6 oz (102.3 kg)   SpO2 96%   BMI 30.60 kg/m    Wt Readings from Last 3 Encounters:  02/10/24 225 lb 9.6 oz (102.3 kg)  02/07/24 229 lb 4.8 oz (104 kg)  01/26/24 241 lb 4.8 oz (109.5 kg)    GEN: Well nourished, well developed in no acute distress NECK: No JVD; No carotid bruits CARDIAC: RRR, no murmurs, rubs, gallops RESPIRATORY:  No increased work of breathing. Diminished breath sounds left lower lobe. No rales, wheezing or rhonchi  ABDOMEN: Sternotomy incision is well approximated, no signs of infection. Soft,  non-tender, non-distended EXTREMITIES:  No edema; No deformity     ASSESSMENT AND PLAN: .    CAD s/p CABG: S/p CABG x 4 on 01/21/24, previously placed stent in mRCA with minimal restenosis on cath 01/15/24. He has left chest discomfort with coughing but otherwise no chest pain. He has increased shortness of breath over the past week he feels is secondary to pleural effusion. No orthopnea or PND. Admits he has not been very active. PT has been coming to his home, but he has been unable to complete the exercises. We are increasing furosemide to 80 mg daily to hopefully provide improvement in SOB. No symptoms concerning for angina. Sternotomy incision is well-approximated with no signs of infection. Encouraged continued work with PT to gain strength. No bleeding concerns. Continue aspirin, metoprolol, atorvastatin.   Atrial fibrillation on chronic anticoagulation s/p LA appendage closure: He presented to hospital on 01/13/2024 in AF RVR. He was asymptomatic. He ultimately underwent CABG for obstructive  CAD at which time he additionally had MAZE and LAA closure for treatment of a fib.  EKG today reveals tachycardic rhythm at 118 bpm, somewhat organized, uncertain as to whether it is sinus tachycardia or atrial flutter.  Reviewed plan with Dr. Anne Mcclure, primary cardiologist.  We will increase Toprol to 50 mg twice daily.  Continue amiodarone 200 mg daily. No bleeding concerns. We will continue Eliquis 2.5 mg twice daily which is appropriate dose (age, creatinine) for stroke prevention for CHA2DS2-VASc score of  5.  Advised we are rechecking renal function today and will need to increase Eliquis to 5 mg twice daily if creatinine is < 1.5. Will also recheck CBC to ensure stable blood counts.   Shortness of breath/Pleural effusion: He had mild to moderate left pleural effusion s/p thoracentesis which yielded 1.3 liters of pleural fluid. CXR 02/07/24 revealed recurrent moderate left pleural effusion.  He reports increased  shortness of breath over the past week. Has bilateral LE edema that has improved since hospitalization. Encouraged appropriate leg elevation. Discussed symptoms with Dr. Anne Mcclure, primary cardiologist, who advised increased dose of furosemide for one week and keep follow-up with Dr. Cliffton Asters on 02/14/24. Pt requests repeat CXR on morning of 02/14/24, prior to appointment. Will forward request to his office.   Diabetes: Preop A1c was 7.1. He was started on Januvia and metformin.  Lifestyle modification encouraged. Advised further discussion of management of diabetes with PCP.   Hypertension: BP is well controlled.  He asks about discontinuing losartan which was done during admission. Advised that losartan was discontinued secondary to AKI and BP has been well controlled without it.  Advised him to continue to monitor home BP and report concerns prior to next office visit.  We are rechecking renal function today. Continue metoprolol.  Carotid artery disease: Minimal wall thickening in right ICA, 1-39% stenosis in left ICA. Will continue to monitor clinically for now.  Hyperlipidemia LDL goal < 55: Lipid panel completed 01/16/2024 with total cholesterol 117, HDL 35, LDL 16, and triglycerides 161.  Atorvastatin was increased from 10 mg daily to 40 mg daily. Advised patient high intensity statin is indicated in the setting of recent CABG and high triglycerides. We will repeat lipid panel in 2-3 months. Encouraged healthy diet avoiding saturated fat, processed foods, simple carbohydrates, and sugar. Continue atorvastatin.     Cardiac Rehabilitation Eligibility Assessment  The patient is ready to start cardiac rehabilitation pending clearance from the cardiac surgeon.       Disposition:2-3 weeks with Dr. Anne Mcclure or me  Signed, Eligha Bridegroom, NP-C

## 2024-02-10 ENCOUNTER — Encounter: Payer: Self-pay | Admitting: Nurse Practitioner

## 2024-02-10 ENCOUNTER — Ambulatory Visit: Payer: Federal, State, Local not specified - PPO | Attending: Nurse Practitioner | Admitting: Nurse Practitioner

## 2024-02-10 VITALS — BP 116/60 | HR 118 | Ht 72.0 in | Wt 225.6 lb

## 2024-02-10 DIAGNOSIS — I2510 Atherosclerotic heart disease of native coronary artery without angina pectoris: Secondary | ICD-10-CM | POA: Diagnosis not present

## 2024-02-10 DIAGNOSIS — Z951 Presence of aortocoronary bypass graft: Secondary | ICD-10-CM | POA: Diagnosis not present

## 2024-02-10 DIAGNOSIS — I6523 Occlusion and stenosis of bilateral carotid arteries: Secondary | ICD-10-CM

## 2024-02-10 DIAGNOSIS — I10 Essential (primary) hypertension: Secondary | ICD-10-CM

## 2024-02-10 DIAGNOSIS — E785 Hyperlipidemia, unspecified: Secondary | ICD-10-CM

## 2024-02-10 DIAGNOSIS — E1159 Type 2 diabetes mellitus with other circulatory complications: Secondary | ICD-10-CM

## 2024-02-10 DIAGNOSIS — R0602 Shortness of breath: Secondary | ICD-10-CM

## 2024-02-10 DIAGNOSIS — J90 Pleural effusion, not elsewhere classified: Secondary | ICD-10-CM

## 2024-02-10 LAB — CBC

## 2024-02-10 MED ORDER — FUROSEMIDE 40 MG PO TABS: 80.0000 mg | ORAL_TABLET | Freq: Every day | ORAL | 1 refills | Status: DC

## 2024-02-10 MED ORDER — POTASSIUM CHLORIDE CRYS ER 10 MEQ PO TBCR: EXTENDED_RELEASE_TABLET | ORAL | 1 refills | Status: DC

## 2024-02-10 MED ORDER — METOPROLOL SUCCINATE ER 50 MG PO TB24: 50.0000 mg | ORAL_TABLET | Freq: Two times a day (BID) | ORAL | 3 refills | Status: DC

## 2024-02-10 NOTE — Patient Instructions (Signed)
 Medication Instructions:   INCREASE K-dur two (2) tablets by mouth ( 20 mEq) daily.  INCREASE Lasix two (2) tablets by mouth ( 80 mg) daily.   INCREASE Toprol XL one (1) tablet by mouth ( 50 mg) twice daily.   *If you need a refill on your cardiac medications before your next appointment, please call your pharmacy*   Lab Work:  TODAY!!! CMET/CBC  If you have labs (blood work) drawn today and your tests are completely normal, you will receive your results only by: MyChart Message (if you have MyChart) OR A paper copy in the mail If you have any lab test that is abnormal or we need to change your treatment, we will call you to review the results.   Testing/Procedures:  None ordered.   Follow-Up: At [facility], you and your health needs are our priority.  As part of our continuing mission to provide you with exceptional heart care, we have created designated Provider Care Teams.  These Care Teams include your primary Cardiologist (physician) and Advanced Practice Providers (APPs -  Physician Assistants and Nurse Practitioners) who all work together to provide you with the care you need, when you need it.  We recommend signing up for the patient portal called "MyChart".  Sign up information is provided on this After Visit Summary.  MyChart is used to connect with patients for Virtual Visits (Telemedicine).  Patients are able to view lab/test results, encounter notes, upcoming appointments, etc.  Non-urgent messages can be sent to your provider as well.   To learn more about what you can do with MyChart, go to [email or web address].    Your next appointment:   2 week(s)  Provider:   Donato Schultz, MD     Limit all fluids for the day to less than 2 liters. Fluid includes all drinks, coffee, juice, ice chips, soup, jello, and all other liquids.

## 2024-02-11 LAB — COMPREHENSIVE METABOLIC PANEL
ALT: 15 IU/L (ref 0–44)
AST: 14 IU/L (ref 0–40)
Albumin: 4.3 g/dL (ref 3.7–4.7)
Alkaline Phosphatase: 84 IU/L (ref 44–121)
BUN/Creatinine Ratio: 12 (ref 10–24)
BUN: 19 mg/dL (ref 8–27)
Bilirubin Total: 1.1 mg/dL (ref 0.0–1.2)
CO2: 24 mmol/L (ref 20–29)
Calcium: 9.6 mg/dL (ref 8.6–10.2)
Chloride: 96 mmol/L (ref 96–106)
Creatinine, Ser: 1.58 mg/dL — ABNORMAL HIGH (ref 0.76–1.27)
Globulin, Total: 2.8 g/dL (ref 1.5–4.5)
Glucose: 117 mg/dL — ABNORMAL HIGH (ref 70–99)
Potassium: 4.7 mmol/L (ref 3.5–5.2)
Sodium: 136 mmol/L (ref 134–144)
Total Protein: 7.1 g/dL (ref 6.0–8.5)
eGFR: 44 mL/min/{1.73_m2} — ABNORMAL LOW (ref >59)

## 2024-02-11 LAB — CBC
Hematocrit: 34.7 % — ABNORMAL LOW (ref 37.5–51.0)
Hemoglobin: 10.9 g/dL — ABNORMAL LOW (ref 13.0–17.7)
MCH: 30 pg (ref 26.6–33.0)
MCHC: 31.4 g/dL — ABNORMAL LOW (ref 31.5–35.7)
MCV: 96 fL (ref 79–97)
Platelets: 313 10*3/uL (ref 150–450)
RBC: 3.63 x10E6/uL — ABNORMAL LOW (ref 4.14–5.80)
RDW: 14.4 % (ref 11.6–15.4)
WBC: 6.4 10*3/uL (ref 3.4–10.8)

## 2024-02-12 ENCOUNTER — Ambulatory Visit: Payer: Federal, State, Local not specified - PPO | Admitting: Neurology

## 2024-02-13 ENCOUNTER — Other Ambulatory Visit: Payer: Self-pay | Admitting: Thoracic Surgery (Cardiothoracic Vascular Surgery)

## 2024-02-13 DIAGNOSIS — Z951 Presence of aortocoronary bypass graft: Secondary | ICD-10-CM

## 2024-02-14 ENCOUNTER — Ambulatory Visit: Admitting: Thoracic Surgery (Cardiothoracic Vascular Surgery)

## 2024-02-14 ENCOUNTER — Ambulatory Visit: Payer: Self-pay | Admitting: Thoracic Surgery (Cardiothoracic Vascular Surgery)

## 2024-02-14 ENCOUNTER — Ambulatory Visit
Admission: RE | Admit: 2024-02-14 | Discharge: 2024-02-14 | Disposition: A | Discharge status: Home / Self Care | Source: Ambulatory Visit | Attending: Thoracic Surgery (Cardiothoracic Vascular Surgery) | Admitting: Thoracic Surgery (Cardiothoracic Vascular Surgery)

## 2024-02-14 VITALS — BP 117/76 | HR 103 | Resp 18 | Ht 72.0 in | Wt 223.0 lb

## 2024-02-14 DIAGNOSIS — J90 Pleural effusion, not elsewhere classified: Secondary | ICD-10-CM

## 2024-02-14 DIAGNOSIS — Z951 Presence of aortocoronary bypass graft: Secondary | ICD-10-CM

## 2024-02-14 NOTE — Progress Notes (Signed)
      [address]       [place] [address]             [phone]        [facility] Medical Record #[id number] Date of Birth: 1941-04-14  Referring: Jake Bathe, MD Primary Care: Eartha Inch, MD Primary Cardiologist:Mark Anne Fu, MD  Reason for visit:   follow-up  History of Present Illness:     Overall doing well.  Occasional, chest wall pain.  Physical Exam: BP 117/76   Pulse (!) 103   Resp 18   Ht 6' (1.829 m)   Wt 223 lb (101.2 kg)   SpO2 95%   BMI 30.24 kg/m   Alert NAD Abdomen, ND no peripheral edema   Diagnostic Studies & Laboratory data: CXR: slightly improved L effusion     Assessment / Plan:   83yo male s/p CABG, atriclip.  Overall doing well.  He will follow-up in 3 weeks   [place] Dowd O Darald Uzzle 02/14/2024 2:21 PM

## 2024-02-17 ENCOUNTER — Telehealth (HOSPITAL_COMMUNITY): Payer: Self-pay

## 2024-02-17 NOTE — Telephone Encounter (Signed)
 Pt returned CR phone call and stated he is interested in CR. Adv pt of note below.

## 2024-02-18 ENCOUNTER — Telehealth (HOSPITAL_BASED_OUTPATIENT_CLINIC_OR_DEPARTMENT_OTHER): Payer: Self-pay | Admitting: *Deleted

## 2024-02-18 ENCOUNTER — Ambulatory Visit (INDEPENDENT_AMBULATORY_CARE_PROVIDER_SITE_OTHER): Payer: Self-pay | Admitting: Physician Assistant

## 2024-02-18 VITALS — BP 126/81 | HR 112 | Resp 18 | Ht 72.0 in | Wt 223.0 lb

## 2024-02-18 DIAGNOSIS — Z5189 Encounter for other specified aftercare: Secondary | ICD-10-CM

## 2024-02-18 DIAGNOSIS — Z951 Presence of aortocoronary bypass graft: Secondary | ICD-10-CM

## 2024-02-18 DIAGNOSIS — J90 Pleural effusion, not elsewhere classified: Secondary | ICD-10-CM

## 2024-02-18 MED ORDER — FUROSEMIDE 40 MG PO TABS: ORAL_TABLET | ORAL | Status: DC

## 2024-02-18 MED ORDER — POTASSIUM CHLORIDE CRYS ER 10 MEQ PO TBCR: EXTENDED_RELEASE_TABLET | ORAL | Status: DC

## 2024-02-18 NOTE — Telephone Encounter (Signed)
 S/w pt is aware of recommendations,medication list updated and bmet added to upcoming appt notes.

## 2024-02-18 NOTE — Telephone Encounter (Signed)
-----   Message from Levi Aland sent at 02/18/2024 10:10 AM EDT ----- His pleural effusion has improved. Advise him to reduce Lasix to 40 mg and potassium to 10 meq for one week and then discontinue both. Plan is to recheck BMET on 4/2 when he comes to see Dr. Anne Fu. ----- Message ----- From: Debbe Bales Sent: 02/18/2024   9:21 AM EDT To: Levi Aland, NP  What would you like me to do?? ----- Message ----- From: Debbe Bales Sent: 02/18/2024  12:00 AM EDT To: Debbe Bales  Call pt after Monday march 24 to ask msw about pt's lasix after seeing Dr. Cliffton Asters.

## 2024-02-18 NOTE — Patient Instructions (Signed)
 Because if you notice redness, pain, or more swelling in your right leg.  Follow-up in 1 week for wound check and follow-up chest x-ray

## 2024-02-18 NOTE — Progress Notes (Signed)
 HPI: [name] is an 83 year old gentleman with a past history of hypertension, type 2 diabetes mellitus, stage IIIa CKD, atrial fibrillation, and coronary artery disease.  He is status post CABG x 4 with Maze procedure and application of a left atrial appendage clip by Dr. Cliffton Asters on 01/21/2024.  He was discharged home on postop day 7 and about a week later had a left thoracentesis yielding about 1.3 L of serous fluid.  He was discharged home in sinus rhythm and has remained in sinus rhythm on subsequent postop visits.  He was last seen by Dr. Cliffton Asters on 02/14/2024 and was doing well at that time.  His only office today to report he did not just noticed a bulge overlying the right lower extremity EVH tunnel he had not seen before.  He admitted that it may have been there all along but he is just now noticing it.  He said it feels a little tender when he touches it but otherwise does not cause him any pain.  He has not had any fever or palpitations.  He does report feel that he is getting progressively short of breath with activity.   Current Outpatient Medications  Medication Sig Dispense Refill   acetaminophen (TYLENOL) 500 MG tablet Take 1-2 tablets (500-1,000 mg total) by mouth every 6 (six) hours as needed.     amiodarone (PACERONE) 200 MG tablet Take 1 tablet (200 mg total) by mouth daily. 30 tablet 1   amoxicillin (AMOXIL) 500 MG capsule As needed for dental procedure     apixaban (ELIQUIS) 2.5 MG TABS tablet Take 1 tablet (2.5 mg total) by mouth 2 (two) times daily. 60 tablet 3   aspirin EC 81 MG tablet Take 81 mg by mouth daily.     atorvastatin (LIPITOR) 40 MG tablet Take 1 tablet (40 mg total) by mouth at bedtime. 30 tablet 3   carisoprodol (SOMA) 250 MG tablet Take 250 mg by mouth at bedtime as needed (muscle spasms).     diphenhydrAMINE (BENADRYL) 25 mg capsule Take 1 capsule (25 mg total) by mouth at bedtime as needed for sleep.     famotidine (PEPCID) 20 MG tablet Take 1 tablet  (20 mg total) by mouth daily. 30 tablet 1   fluticasone (FLONASE) 50 MCG/ACT nasal spray Place 1 spray into both nostrils daily as needed.   3   furosemide (LASIX) 40 MG tablet Reduce lasix one (1) tablet by mouth ( 40 mg) daily for one week than discontinue on 02/25/24.     JANUVIA 100 MG tablet Take 100 mg by mouth daily.     levothyroxine (SYNTHROID) 75 MCG tablet Take 75 mcg by mouth daily.     Magnesium 500 MG CAPS Take 1,000 mg by mouth daily.     metFORMIN (GLUCOPHAGE) 1000 MG tablet Take 1 tablet by mouth 2 (two) times daily.     methylcellulose oral powder Take 1 packet by mouth daily.     metoprolol succinate (TOPROL XL) 50 MG 24 hr tablet Take 1 tablet (50 mg total) by mouth in the morning and at bedtime. 180 tablet 3   Multiple Vitamin (MULTIVITAMIN) tablet Take 1 tablet by mouth daily.     NONFORMULARY OR COMPOUNDED ITEM Topical antifungal topical solution: terbinafine 3%, fluconazole 2%, tea tree oil 5%, Urea 10%, ibuprofen2% in DMSO suspension. Sig: Apply to the affected toenail(s) once daily.  Order sent to [facility] [address] [place], [place] [address] 30 each 5   Omega-3 Fatty  Acids (OMEGA-3 FISH OIL PO) Take 4,000 mg by mouth at bedtime.     ONE TOUCH ULTRA TEST test strip USE 1 TO 2 TIMES A DAY  5   potassium chloride (KLOR-CON M) 10 MEQ tablet Reduce to one (1) tablet by mouth ( 10 mEq) daily for one week than discontinue on 02/25/24.     Probiotic Product (PROBIOTIC DAILY PO) Take 1 tablet by mouth daily.     No current facility-administered medications for this visit.    Physical Exam: Vital signs BP 126/80  Heart rate 112 Respirations 18 SpO2 96% on room air  General: [name] is in no distress.  Skin is warm and dry Heart: Regular rhythm, mildly tachycardic, no murmur Chest: Normal work of breathing at rest, breath sounds are clear, diminished left base Extremities: Trace lower extremity edema.  Fluctuant bulge along the right lower  extremity EVH tunnel beginning at the level of the knee and extending cephalad about 10 cm.  There is minimal tenderness no inflammation and no evidence of any drainage.  Diagnostic Tests: None today  Impression / Plan: -Seroma right lower extremity EVH tunnel: This may also represent a liquefied hematoma.  There is no inflammation or evidence of infection and he is not sure how long this has been present.  This may or may not resolve on its own but it is worthwhile giving it some time to declare itself rather than risking getting it infected with a drainage procedure.  Will plan to follow-up with him in 1 week.  I asked him to let us know if he notices any signs and infection locally or if he begins to have a fever or pain at the site.  Will also repeat chest x-ray in 1 week to reevaluate the left pleural effusion since he is complaining of progressive shortness of breath and the x-ray on 02/14/2024 was still showing a moderate left effusion.Leary Roca, PA-C [facility] [phone]

## 2024-02-20 ENCOUNTER — Other Ambulatory Visit: Payer: Self-pay | Admitting: Thoracic Surgery (Cardiothoracic Vascular Surgery)

## 2024-02-20 DIAGNOSIS — Z951 Presence of aortocoronary bypass graft: Secondary | ICD-10-CM

## 2024-02-21 NOTE — Progress Notes (Signed)
 HPI: [name] is an 83 year old gentleman with a past history of hypertension, type 2 diabetes mellitus, stage IIIa CKD, atrial fibrillation, and coronary artery disease. He is status post CABG x 4 with Maze procedure and application of a left atrial appendage clip by Dr. Cliffton Asters on 01/21/2024. He was discharged home on postop day 7 and about a week later had a left thoracentesis yielding about 1.3 L of serous fluid. He was discharged home in sinus rhythm and has remained in sinus rhythm on subsequent postop visits. He was last seen by Dr. Cliffton Asters on 02/14/2024 and was doing well at that time but returned to the office few days later when he noticed a bulge overlying the right medial thigh EVH tunnel.  On exam, this appears to be either a seroma or liquefied hematoma.  There is no induration or inflammation so I elected to give this some time to see if it would resolve on its own.  Darin Mcclure also reported he was continuing to have shortness of breath with exertion.  His most recent chest x-ray on 02/14/2024 did show a persistent moderate left pleural effusion. Darin Mcclure was asked to return today for follow-up of the right leg fluid collection and also for left pleural effusion.  He continues to have cough and shortness of breath with activity that is now improved.  The fluid collection in his right thigh has gotten larger since the visit last week.  He remains afebrile and denies having any pain in his right thigh.    Current Outpatient Medications  Medication Sig Dispense Refill   acetaminophen (TYLENOL) 500 MG tablet Take 1-2 tablets (500-1,000 mg total) by mouth every 6 (six) hours as needed.     amiodarone (PACERONE) 200 MG tablet Take 1 tablet (200 mg total) by mouth daily. 30 tablet 1   amoxicillin (AMOXIL) 500 MG capsule As needed for dental procedure     apixaban (ELIQUIS) 2.5 MG TABS tablet Take 1 tablet (2.5 mg total) by mouth 2 (two) times daily. 60 tablet 3   aspirin EC 81 MG  tablet Take 81 mg by mouth daily.     atorvastatin (LIPITOR) 40 MG tablet Take 1 tablet (40 mg total) by mouth at bedtime. 30 tablet 3   carisoprodol (SOMA) 250 MG tablet Take 250 mg by mouth at bedtime as needed (muscle spasms).     diphenhydrAMINE (BENADRYL) 25 mg capsule Take 1 capsule (25 mg total) by mouth at bedtime as needed for sleep.     famotidine (PEPCID) 20 MG tablet Take 1 tablet (20 mg total) by mouth daily. 30 tablet 1   fluticasone (FLONASE) 50 MCG/ACT nasal spray Place 1 spray into both nostrils daily as needed.   3   furosemide (LASIX) 40 MG tablet Reduce lasix one (1) tablet by mouth ( 40 mg) daily for one week than discontinue on 02/25/24.     JANUVIA 100 MG tablet Take 100 mg by mouth daily.     levothyroxine (SYNTHROID) 75 MCG tablet Take 75 mcg by mouth daily.     Magnesium 500 MG CAPS Take 1,000 mg by mouth daily.     metFORMIN (GLUCOPHAGE) 1000 MG tablet Take 1 tablet by mouth 2 (two) times daily.     methylcellulose oral powder Take 1 packet by mouth daily.     metoprolol succinate (TOPROL XL) 50 MG 24 hr tablet Take 1 tablet (50 mg total) by mouth in the morning and at bedtime. 180 tablet 3   Multiple  Vitamin (MULTIVITAMIN) tablet Take 1 tablet by mouth daily.     NONFORMULARY OR COMPOUNDED ITEM Topical antifungal topical solution: terbinafine 3%, fluconazole 2%, tea tree oil 5%, Urea 10%, ibuprofen2% in DMSO suspension. Sig: Apply to the affected toenail(s) once daily.  Order sent to [facility] [address] [place], [place] [address] 30 each 5   Omega-3 Fatty Acids (OMEGA-3 FISH OIL PO) Take 4,000 mg by mouth at bedtime.     ONE TOUCH ULTRA TEST test strip USE 1 TO 2 TIMES A DAY  5   potassium chloride (KLOR-CON M) 10 MEQ tablet Reduce to one (1) tablet by mouth ( 10 mEq) daily for one week than discontinue on 02/25/24.     Probiotic Product (PROBIOTIC DAILY PO) Take 1 tablet by mouth daily.     No current facility-administered medications for this  visit.    Physical Exam: Vital signs BP 121/73 Heart rate 110 Respirations 18 SpO2 95% on room air  General: [name] is in good spirits with no new concerns.  Skin is warm and dry, color is good.  His wife is accompanying him today. Heart: Regular rhythm, mildly tachycardic (this is chronic) Chest: Normal work of breathing, breath sounds are clear.  Sternotomy incision is well-healed and the sternum is stable. Extremities: The fluctuant fluid collection in the right lower thigh EVH tunnel is slightly more tense today.  The surrounding tissues are normal with no erythema no induration.  There is no significant tenderness.  There is trace lower extremity pretibial edema.  Diagnostic Tests: CLINICAL DATA:  CABG.   EXAM: CHEST - 2 VIEW   COMPARISON:  Chest radiograph dated 02/14/2024.   FINDINGS: No significant interval change in left pleural effusion and left lung base atelectasis or infiltrate. The right lung is clear. No pneumothorax. Stable cardiac silhouette. Median sternotomy wires. Atherosclerotic calcification of the aorta. No acute osseous pathology.   IMPRESSION: No significant interval change in left pleural effusion and left lung base atelectasis or infiltrate.     Electronically Signed   By: Elgie Collard M.D.   On: 02/24/2024 12:47  Impression / Plan: Darin Mcclure is now 5 weeks post coronary bypass grafting with bilateral lower extremity vein harvesting.  His postoperative course was complicated by left pleural effusion that was drained with thoracentesis yielding about  1.3 L of fluid.  The left pleural effusion has recurred despite diuresis in addition, he has developed a fluid collection in the right lower thigh EVH tunnel.  This appears to be a seroma and has enlarged since his last visit less than a week ago. After his consent was given, the left thigh was prepped with Betadine then anesthetized with 1% lidocaine right over the fluid collection.  The  collection was then aspirated with an 18-gauge needle and 60 cc of thin brown fluid was evacuated.  A sample was sent for routine culture.  A sterile dressing was applied.  Will arrange for left thoracentesis by interventional radiology followed by steroid taper.  I asked him to keep the left lower extremity loosely wrapped in an Ace bandage up to the level of the seroma for several hours a day.  We will see Mr. Chavana back in the office in 1 week for wound check and chest x-ray.   Leary Roca, PA-C [facility] [phone]

## 2024-02-24 ENCOUNTER — Other Ambulatory Visit: Payer: Self-pay | Admitting: *Deleted

## 2024-02-24 ENCOUNTER — Telehealth: Payer: Self-pay | Admitting: *Deleted

## 2024-02-24 ENCOUNTER — Ambulatory Visit
Admission: RE | Admit: 2024-02-24 | Discharge: 2024-02-24 | Disposition: A | Discharge status: Home / Self Care | Source: Ambulatory Visit | Attending: Thoracic Surgery (Cardiothoracic Vascular Surgery) | Admitting: Thoracic Surgery (Cardiothoracic Vascular Surgery)

## 2024-02-24 ENCOUNTER — Encounter: Payer: Self-pay | Admitting: Physician Assistant

## 2024-02-24 ENCOUNTER — Ambulatory Visit (INDEPENDENT_AMBULATORY_CARE_PROVIDER_SITE_OTHER): Payer: Self-pay | Admitting: Physician Assistant

## 2024-02-24 ENCOUNTER — Other Ambulatory Visit: Payer: Self-pay | Admitting: Thoracic Surgery (Cardiothoracic Vascular Surgery)

## 2024-02-24 VITALS — BP 121/73 | HR 110 | Resp 18 | Ht 72.0 in | Wt 227.0 lb

## 2024-02-24 DIAGNOSIS — Z5189 Encounter for other specified aftercare: Secondary | ICD-10-CM

## 2024-02-24 DIAGNOSIS — Z951 Presence of aortocoronary bypass graft: Secondary | ICD-10-CM

## 2024-02-24 DIAGNOSIS — J90 Pleural effusion, not elsewhere classified: Secondary | ICD-10-CM

## 2024-02-24 MED ORDER — METHYLPREDNISOLONE 4 MG PO TBPK: ORAL_TABLET | ORAL | 0 refills | Status: DC

## 2024-02-24 NOTE — Patient Instructions (Signed)
 Keep a light pressure dressing applied to the right wound using an ace wrap.  Keep this in place as often as possible but may be removed as needed for bathing.  We will schedule for left thoracentesis with interventional radiology.  Medrol Dosepak as prescribed and electronically to [facility].  Take as directed on the container until finished beginning on the day of the thoracentesis.  Follow-up in the office for wound check and chest x-ray in 1 week.

## 2024-02-24 NOTE — Telephone Encounter (Signed)
 Contacted [facility] at [phone]. Spoke with Mindi Junker. Pick up confirmation number [id number].

## 2024-02-25 ENCOUNTER — Ambulatory Visit (HOSPITAL_COMMUNITY)
Admission: RE | Admit: 2024-02-25 | Discharge: 2024-02-25 | Disposition: A | Discharge status: Home / Self Care | Source: Ambulatory Visit | Attending: Interventional Radiology | Admitting: Interventional Radiology

## 2024-02-25 ENCOUNTER — Ambulatory Visit (HOSPITAL_COMMUNITY)
Admission: RE | Admit: 2024-02-25 | Discharge: 2024-02-25 | Discharge status: Home / Self Care | Source: Ambulatory Visit | Attending: Thoracic Surgery (Cardiothoracic Vascular Surgery)

## 2024-02-25 DIAGNOSIS — J90 Pleural effusion, not elsewhere classified: Secondary | ICD-10-CM | POA: Diagnosis present

## 2024-02-25 DIAGNOSIS — Z951 Presence of aortocoronary bypass graft: Secondary | ICD-10-CM

## 2024-02-25 HISTORY — PX: IR THORACENTESIS ASP PLEURAL SPACE W/IMG GUIDE: IMG5380

## 2024-02-25 MED ORDER — LIDOCAINE HCL 1 % IJ SOLN
20.0000 mL | Freq: Once | INTRAMUSCULAR | Status: AC
  Administered 2024-02-25: 10 mL via INTRADERMAL

## 2024-02-25 MED ORDER — LIDOCAINE HCL 1 % IJ SOLN
INTRAMUSCULAR | Status: AC
  Filled 2024-02-25: qty 20

## 2024-02-25 NOTE — Procedures (Signed)
 PROCEDURE SUMMARY:  Successful image-guided left thoracentesis. Yielded 1.4 liters of clear, dark amber fluid. Patient tolerated procedure well. EBL: Zero No immediate complications.  Post procedure CXR shows no pneumothorax.  Please see imaging section of Epic for full dictation.  Bing Neighbors Armine Rizzolo PA-C 02/25/2024 9:39 AM

## 2024-02-26 ENCOUNTER — Encounter: Payer: Self-pay | Admitting: Cardiology

## 2024-02-26 ENCOUNTER — Telehealth: Payer: Self-pay | Admitting: *Deleted

## 2024-02-26 ENCOUNTER — Other Ambulatory Visit: Payer: Self-pay | Admitting: Physician Assistant

## 2024-02-26 ENCOUNTER — Ambulatory Visit: Attending: Cardiology | Admitting: Cardiology

## 2024-02-26 ENCOUNTER — Other Ambulatory Visit: Payer: Self-pay | Admitting: Thoracic Surgery (Cardiothoracic Vascular Surgery)

## 2024-02-26 VITALS — BP 134/66 | HR 115 | Ht 72.0 in | Wt 228.6 lb

## 2024-02-26 DIAGNOSIS — Z951 Presence of aortocoronary bypass graft: Secondary | ICD-10-CM

## 2024-02-26 DIAGNOSIS — I2510 Atherosclerotic heart disease of native coronary artery without angina pectoris: Secondary | ICD-10-CM

## 2024-02-26 DIAGNOSIS — I4510 Unspecified right bundle-branch block: Secondary | ICD-10-CM | POA: Diagnosis not present

## 2024-02-26 MED ORDER — APIXABAN 5 MG PO TABS: 5.0000 mg | ORAL_TABLET | Freq: Two times a day (BID) | ORAL | 3 refills | Status: DC

## 2024-02-26 MED ORDER — APIXABAN 5 MG PO TABS: 5.0000 mg | ORAL_TABLET | Freq: Two times a day (BID) | ORAL | 0 refills | Status: DC

## 2024-02-26 MED ORDER — APIXABAN 5 MG PO TABS: 5.0000 mg | ORAL_TABLET | Freq: Two times a day (BID) | ORAL | 11 refills | Status: DC

## 2024-02-26 MED ORDER — APIXABAN 5 MG PO TABS: 5.0000 mg | ORAL_TABLET | Freq: Two times a day (BID) | ORAL | Status: DC

## 2024-02-26 NOTE — Telephone Encounter (Signed)
 Patient contacted the office while at this Cardiologist's office c/o right leg swelling. Per patient, he has been wearing an ace bandage as advised after seroma drainage. States he woke up today and his entire right leg was swollen from right foot to above seroma. States seroma site is painful to touch. No other pain besides that. States there is a bruise at the site where seroma was drained. Denies fever. States he has been wearing bandage loosely for 20 hours a day. Discussed with Gaynelle Arabian, PA. Venous duplex of right leg to r/o DVT ordered and scheduled from tomorrow. Patient scheduled to follow up with [facility] in clinic after scan. Patient advised to discontinue wearing bandage and to keep leg elevated as much as possible. Patient verbalized understanding and confirmed upcoming appts.

## 2024-02-26 NOTE — H&P (View-Only) (Signed)
 Cardiology Office Note:  .   Date:  02/26/2024  ID:  Darin Mcclure, DOB 03/10/1941, MRN [id number] PCP: Darin Inch, MD  [facility] Providers Cardiologist:  Darin Schultz, MD    History of Present Illness: Darin Mcclure   Darin Mcclure is a 83 y.o. male Discussed the use of AI scribe software for clinical note transcription with the patient, who gave verbal consent to proceed.  History of Present Illness Darin Mcclure is an 83 year old male with atrial flutter post-CABG who presents with concerns about his heart rhythm and medication management.  He is experiencing atrial flutter, identified on today's EKG with a heart rate of 115 beats per minute. He has a history of atrial flutter prior to his heart surgery, which included a CABG and a Maze procedure. He is currently on amiodarone 200 mg daily and Toprol 50 mg twice a day. His heart rate remains elevated at around 110 beats per minute, even at rest.  He underwent a CABG with a Maze procedure and left atrial appendage clip on January 21, 2024. Post-surgery, he maintained sinus rhythm but now presents with atrial flutter. He is on Eliquis 2.5 mg twice a day, aspirin 81 mg daily, atorvastatin 40 mg daily, and Toprol 50 mg twice a day. He missed his morning dose of Eliquis due to running out of medication.  He reports a possible seroma at the vein site of his leg, which has been swelling. He had fluid removed on Monday and was advised to wear an ACE bandage. He notes significant swelling in the leg despite these measures.  He has a history of diabetes with a preoperative A1c of 7.1, managed with Januvia and metformin. His hypertension is under good control with Toprol 50 mg twice daily. He also has mild carotid artery disease treated with atorvastatin 40 mg.  His liver function tests were elevated post-surgery, with the last recorded level at "3.6" he states. His kidney function has returned to normal. He has not had recent liver  function tests since discharge from the hospital.    Studies Reviewed: Darin Mcclure   EKG Interpretation Date/Time:  Wednesday February 26 2024 10:10:16 EDT Ventricular Rate:  115 PR Interval:    QRS Duration:  156 QT Interval:  392 QTC Calculation: 542 R Axis:   -17  Text Interpretation: Atrial flutter with 2 to 1 block Right bundle branch block When compared with ECG of 10-Feb-2024 08:31, Wide QRS rhythm has replaced Sinus rhythm Confirmed by Darin Mcclure ([id number]) on 02/26/2024 10:16:05 AM    Results LABS A1c: 7.1% LFT: 3.6 U/L Cr: 1.5 mg/dL  DIAGNOSTIC EKG: Ventricular rate of 118 bpm with right bundle branch block (02/10/2024) EKG: Heart rate of 115 bpm with atrial flutter pattern (02/26/2024) Risk Assessment/Calculations:            Physical Exam:   VS:  BP 134/66   Pulse (!) 115   Ht 6' (1.829 m)   Wt 228 lb 9.6 oz (103.7 kg)   SpO2 97%   BMI 31.00 kg/m    Wt Readings from Last 3 Encounters:  02/26/24 228 lb 9.6 oz (103.7 kg)  02/24/24 227 lb (103 kg)  02/18/24 223 lb (101.2 kg)    GEN: Well nourished, well developed in no acute distress NECK: Pusitile JVD (AFLUTTER); No carotid bruits CARDIAC: Tachy reg no murmurs, no rubs, no gallops CABG scar RESPIRATORY:  Clear to auscultation without rales, wheezing or rhonchi  ABDOMEN: Soft, non-tender, non-distended EXTREMITIES:  RLE EVH site, swelling, edema noted   ASSESSMENT AND PLAN: .    Assessment and Plan Assessment & Plan Atrial flutter Atrial flutter with a heart rate of 115 bpm, likely related to recent cardiac surgery and prior atrial arrhythmias. Increased stroke risk due to potential blood pooling and clot formation in the atria. Cardioversion is planned to restore normal rhythm. Amiodarone will continue post-cardioversion for rhythm maintenance. Left atrial appendage clipping and maze procedure performed during surgery aid in stroke prevention and rhythm control. - Schedule cardioversion to restore normal heart rhythm  - risks and benefits discussed. Wife present.  - Continue amiodarone 200 mg daily - Refilled Eliquis to 5 mg twice daily (was taking 2.5 mg BID due to worse AKI in hospital).  - Order complete metabolic profile (CMP) and complete blood count (CBC)  Seroma at vein site Possible seroma at the vein site used for cardiac surgery. Significant swelling despite ACE bandage use. Advised to follow up with cardiothoracic surgery for further evaluation. - Contact cardiothoracic surgery office for evaluation of leg swelling. Continue with their instruction.   Hypertension Hypertension is well-controlled with Toprol 50 mg twice daily. Blood pressure is 134/66 mmHg.  Diabetes mellitus Diabetes managed with Januvia and metformin. Preoperative A1c was 7.1%.  Mild carotid artery disease Mild carotid artery disease managed with atorvastatin 40 mg daily.  Hyperlipidemia Hyperlipidemia managed with atorvastatin 40 mg daily. LDL is very low, and overall cholesterol levels are well controlled. The increase to 40 mg is due to high-intensity statin therapy recommended post-bypass surgery.  Follow-up Follow-up is necessary to ensure conditions are managed appropriately and to address ongoing concerns. - Follow up with cardiothoracic surgery regarding leg swelling - Ensure he receives Eliquis samples immediately to avoid missing doses - Will see in one month post cardioversion - APP.       Cardiac Rehabilitation Eligibility Assessment  The patient is ready to start cardiac rehabilitation pending clearance from the cardiac surgeon.      50 min spent in review of records, CABG records, TCTS, labs, discussion, obtaining Eliquis samples so he does not miss a dose.  Signed, Darin Schultz, MD

## 2024-02-26 NOTE — Progress Notes (Signed)
 Cardiology Office Note:  .   Date:  02/26/2024  ID:  Darin Mcclure, DOB 03/10/1941, MRN [id number] PCP: Darin Inch, MD  [facility] Providers Cardiologist:  Darin Schultz, MD    History of Present Illness: Darin Mcclure   Darin Mcclure is a 83 y.o. male Discussed the use of AI scribe software for clinical note transcription with the patient, who gave verbal consent to proceed.  History of Present Illness Darin Mcclure is an 83 year old male with atrial flutter post-CABG who presents with concerns about his heart rhythm and medication management.  He is experiencing atrial flutter, identified on today's EKG with a heart rate of 115 beats per minute. He has a history of atrial flutter prior to his heart surgery, which included a CABG and a Maze procedure. He is currently on amiodarone 200 mg daily and Toprol 50 mg twice a day. His heart rate remains elevated at around 110 beats per minute, even at rest.  He underwent a CABG with a Maze procedure and left atrial appendage clip on January 21, 2024. Post-surgery, he maintained sinus rhythm but now presents with atrial flutter. He is on Eliquis 2.5 mg twice a day, aspirin 81 mg daily, atorvastatin 40 mg daily, and Toprol 50 mg twice a day. He missed his morning dose of Eliquis due to running out of medication.  He reports a possible seroma at the vein site of his leg, which has been swelling. He had fluid removed on Monday and was advised to wear an ACE bandage. He notes significant swelling in the leg despite these measures.  He has a history of diabetes with a preoperative A1c of 7.1, managed with Januvia and metformin. His hypertension is under good control with Toprol 50 mg twice daily. He also has mild carotid artery disease treated with atorvastatin 40 mg.  His liver function tests were elevated post-surgery, with the last recorded level at "3.6" he states. His kidney function has returned to normal. He has not had recent liver  function tests since discharge from the hospital.    Studies Reviewed: Darin Mcclure   EKG Interpretation Date/Time:  Wednesday February 26 2024 10:10:16 EDT Ventricular Rate:  115 PR Interval:    QRS Duration:  156 QT Interval:  392 QTC Calculation: 542 R Axis:   -17  Text Interpretation: Atrial flutter with 2 to 1 block Right bundle branch block When compared with ECG of 10-Feb-2024 08:31, Wide QRS rhythm has replaced Sinus rhythm Confirmed by Darin Mcclure ([id number]) on 02/26/2024 10:16:05 AM    Results LABS A1c: 7.1% LFT: 3.6 U/L Cr: 1.5 mg/dL  DIAGNOSTIC EKG: Ventricular rate of 118 bpm with right bundle branch block (02/10/2024) EKG: Heart rate of 115 bpm with atrial flutter pattern (02/26/2024) Risk Assessment/Calculations:            Physical Exam:   VS:  BP 134/66   Pulse (!) 115   Ht 6' (1.829 m)   Wt 228 lb 9.6 oz (103.7 kg)   SpO2 97%   BMI 31.00 kg/m    Wt Readings from Last 3 Encounters:  02/26/24 228 lb 9.6 oz (103.7 kg)  02/24/24 227 lb (103 kg)  02/18/24 223 lb (101.2 kg)    GEN: Well nourished, well developed in no acute distress NECK: Pusitile JVD (AFLUTTER); No carotid bruits CARDIAC: Tachy reg no murmurs, no rubs, no gallops CABG scar RESPIRATORY:  Clear to auscultation without rales, wheezing or rhonchi  ABDOMEN: Soft, non-tender, non-distended EXTREMITIES:  RLE EVH site, swelling, edema noted   ASSESSMENT AND PLAN: .    Assessment and Plan Assessment & Plan Atrial flutter Atrial flutter with a heart rate of 115 bpm, likely related to recent cardiac surgery and prior atrial arrhythmias. Increased stroke risk due to potential blood pooling and clot formation in the atria. Cardioversion is planned to restore normal rhythm. Amiodarone will continue post-cardioversion for rhythm maintenance. Left atrial appendage clipping and maze procedure performed during surgery aid in stroke prevention and rhythm control. - Schedule cardioversion to restore normal heart rhythm  - risks and benefits discussed. Wife present.  - Continue amiodarone 200 mg daily - Refilled Eliquis to 5 mg twice daily (was taking 2.5 mg BID due to worse AKI in hospital).  - Order complete metabolic profile (CMP) and complete blood count (CBC)  Seroma at vein site Possible seroma at the vein site used for cardiac surgery. Significant swelling despite ACE bandage use. Advised to follow up with cardiothoracic surgery for further evaluation. - Contact cardiothoracic surgery office for evaluation of leg swelling. Continue with their instruction.   Hypertension Hypertension is well-controlled with Toprol 50 mg twice daily. Blood pressure is 134/66 mmHg.  Diabetes mellitus Diabetes managed with Januvia and metformin. Preoperative A1c was 7.1%.  Mild carotid artery disease Mild carotid artery disease managed with atorvastatin 40 mg daily.  Hyperlipidemia Hyperlipidemia managed with atorvastatin 40 mg daily. LDL is very low, and overall cholesterol levels are well controlled. The increase to 40 mg is due to high-intensity statin therapy recommended post-bypass surgery.  Follow-up Follow-up is necessary to ensure conditions are managed appropriately and to address ongoing concerns. - Follow up with cardiothoracic surgery regarding leg swelling - Ensure he receives Eliquis samples immediately to avoid missing doses - Will see in one month post cardioversion - APP.       Cardiac Rehabilitation Eligibility Assessment  The patient is ready to start cardiac rehabilitation pending clearance from the cardiac surgeon.      50 min spent in review of records, CABG records, TCTS, labs, discussion, obtaining Eliquis samples so he does not miss a dose.  Signed, Darin Schultz, MD

## 2024-02-26 NOTE — Patient Instructions (Signed)
 Medication Instructions:  Your physician has recommended you make the following change in your medication:  INCREASE ELIQUIS TO 5 MG TWICE DAILY  *If you need a refill on your cardiac medications before your next appointment, please call your pharmacy*  Lab Work: TODAY: CMET, BMET If you have labs (blood work) drawn today and your tests are completely normal, you will receive your results only by: MyChart Message (if you have MyChart) OR A paper copy in the mail If you have any lab test that is abnormal or we need to change your treatment, we will call you to review the results.  Testing/Procedures: Your physician has requested that you have a Cardioversion. During a TEE, sound waves are used to create images of your heart. It provides your doctor with information about the size and shape of your heart and how well your heart's chambers and valves are working. In this test, a transducer is attached to the end of a flexible tube that is guided down you throat and into your esophagus (the tube leading from your mouth to your stomach) to get a more detailed image of your heart. Once the TEE has determined that a blood clot is not present, the cardioversion begins. Electrical Cardioversion uses a jolt of electricity to your heart either through paddles or wired patches attached to your chest. This is a controlled, usually prescheduled, procedure. This procedure is done at the hospital and you are not awake during the procedure. You usually go home the day of the procedure. Please see the instruction sheet given to you today for more information.    Follow-Up: At [facility], you and your health needs are our priority.  As part of our continuing mission to provide you with exceptional heart care, our providers are all part of one team.  This team includes your primary Cardiologist (physician) and Advanced Practice Providers or APPs (Physician Assistants and Nurse Practitioners) who all work  together to provide you with the care you need, when you need it.  Your next appointment:   1 month(s) POST CARDIOVERSION   Provider:   Eligha Bridegroom, NP   We recommend signing up for the patient portal called "MyChart".  Sign up information is provided on this After Visit Summary.  MyChart is used to connect with patients for Virtual Visits (Telemedicine).  Patients are able to view lab/test results, encounter notes, upcoming appointments, etc.  Non-urgent messages can be sent to your provider as well.   To learn more about what you can do with MyChart, go to [email or web address].   Other Instructions     Dear Darin Mcclure  You are scheduled for a Cardioversion on Tuesday, April 8 with Dr. Servando Salina.  Please arrive at the [facility] (Main Entrance A) at [facility]: [address] [place], [place] [address] at 8:30 AM (This time is 1 hour(s) before your procedure to ensure your preparation).   Free valet parking service is available. You will check in at ADMITTING.   *Please Note: You will receive a call the day before your procedure to confirm the appointment time. That time may have changed from the original time based on the schedule for that day.*   DIET:  Nothing to eat or drink after midnight except a sip of water with medications (see medication instructions below)  MEDICATION INSTRUCTIONS: !!IF ANY NEW MEDICATIONS ARE STARTED AFTER TODAY, PLEASE NOTIFY YOUR PROVIDER AS SOON AS POSSIBLE!!  FYI: Medications such as Semaglutide (Ozempic, [place]), Tirzepatide (  Mounjaro, Zepbound), Dulaglutide (Trulicity), etc ("GLP1 agonists") AND Canagliflozin (Invokana), Dapagliflozin (Farxiga), Empagliflozin (Jardiance), Ertugliflozin (Steglatro), Bexagliflozin [employer]) or any combination with one of these drugs such as Invokamet (Canagliflozin/Metformin), Synjardy (Empagliflozin/Metformin), etc ("SGLT2 inhibitors") must be held around the time of a procedure. This is not a  comprehensive list of all of these drugs. Please review all of your medications and talk to your provider if you take any one of these. If you are not sure, ask your provider.         :1}Continue taking your anticoagulant (blood thinner): Apixaban (Eliquis).  You will need to continue this after your procedure until you are told by your provider that it is safe to stop.    Hold Januvia and Lasix and Metformin the day of your procedure.  LABS: Today 4/2 at [facility]:  For your safety, and to allow [place] to monitor your vital signs accurately during the surgery/procedure we request: If you have artificial nails, gel coating, SNS etc, please have those removed prior to your surgery/procedure. Not having the nail coverings /polish removed may result in cancellation or delay of your surgery/procedure.  Your support person will be asked to wait in the waiting room during your procedure.  It is OK to have someone drop you off and come back when you are ready to be discharged.  You cannot drive after the procedure and will need someone to drive you home.  Bring your insurance cards.  *Special Note: Every effort is made to have your procedure done on time. Occasionally there are emergencies that occur at the hospital that may cause delays. Please be patient if a delay does occur.          1st Floor: - Lobby - Registration  - Pharmacy  - Lab - Cafe  2nd Floor: - PV Lab - Diagnostic Testing (echo, CT, nuclear med)  3rd Floor: - Vacant  4th Floor: - TCTS (cardiothoracic surgery) - AFib Clinic - Structural Heart Clinic - Vascular Surgery  - Vascular Ultrasound  5th Floor: - [facility] (general and EP) - Clinical Pharmacy for coumadin, hypertension, lipid, weight-loss medications, and med management appointments    Valet parking services will be available as well.

## 2024-02-27 ENCOUNTER — Ambulatory Visit
Admission: RE | Admit: 2024-02-27 | Discharge: 2024-02-27 | Disposition: A | Discharge status: Home / Self Care | Source: Ambulatory Visit | Attending: Thoracic Surgery (Cardiothoracic Vascular Surgery)

## 2024-02-27 ENCOUNTER — Encounter: Payer: Self-pay | Admitting: Physician Assistant

## 2024-02-27 ENCOUNTER — Ambulatory Visit (HOSPITAL_COMMUNITY)
Admission: RE | Admit: 2024-02-27 | Discharge: 2024-02-27 | Disposition: A | Discharge status: Home / Self Care | Source: Ambulatory Visit | Attending: Cardiovascular Disease | Admitting: Cardiovascular Disease

## 2024-02-27 ENCOUNTER — Other Ambulatory Visit: Payer: Self-pay | Admitting: Thoracic Surgery (Cardiothoracic Vascular Surgery)

## 2024-02-27 ENCOUNTER — Ambulatory Visit (INDEPENDENT_AMBULATORY_CARE_PROVIDER_SITE_OTHER): Payer: Self-pay | Admitting: Physician Assistant

## 2024-02-27 ENCOUNTER — Other Ambulatory Visit: Payer: Self-pay | Admitting: *Deleted

## 2024-02-27 VITALS — BP 130/82 | HR 110 | Resp 20 | Ht 72.0 in | Wt 228.0 lb

## 2024-02-27 DIAGNOSIS — Z79899 Other long term (current) drug therapy: Secondary | ICD-10-CM

## 2024-02-27 DIAGNOSIS — Z951 Presence of aortocoronary bypass graft: Secondary | ICD-10-CM

## 2024-02-27 DIAGNOSIS — E875 Hyperkalemia: Secondary | ICD-10-CM

## 2024-02-27 DIAGNOSIS — J90 Pleural effusion, not elsewhere classified: Secondary | ICD-10-CM

## 2024-02-27 DIAGNOSIS — L7634 Postprocedural seroma of skin and subcutaneous tissue following other procedure: Secondary | ICD-10-CM

## 2024-02-27 LAB — COMPREHENSIVE METABOLIC PANEL WITH GFR
ALT: 17 IU/L (ref 0–44)
AST: 19 IU/L (ref 0–40)
Albumin: 4.3 g/dL (ref 3.7–4.7)
Alkaline Phosphatase: 75 IU/L (ref 44–121)
BUN/Creatinine Ratio: 15 (ref 10–24)
BUN: 20 mg/dL (ref 8–27)
Bilirubin Total: 0.6 mg/dL (ref 0.0–1.2)
CO2: 21 mmol/L (ref 20–29)
Calcium: 9.3 mg/dL (ref 8.6–10.2)
Chloride: 100 mmol/L (ref 96–106)
Creatinine, Ser: 1.31 mg/dL — ABNORMAL HIGH (ref 0.76–1.27)
Globulin, Total: 2.6 g/dL (ref 1.5–4.5)
Glucose: 120 mg/dL — ABNORMAL HIGH (ref 70–99)
Potassium: 5.7 mmol/L — ABNORMAL HIGH (ref 3.5–5.2)
Sodium: 138 mmol/L (ref 134–144)
Total Protein: 6.9 g/dL (ref 6.0–8.5)
eGFR: 55 mL/min/{1.73_m2} — ABNORMAL LOW (ref >59)

## 2024-02-27 LAB — CBC
Hematocrit: 35.2 % — ABNORMAL LOW (ref 37.5–51.0)
Hemoglobin: 10.5 g/dL — ABNORMAL LOW (ref 13.0–17.7)
MCH: 28.8 pg (ref 26.6–33.0)
MCHC: 29.8 g/dL — ABNORMAL LOW (ref 31.5–35.7)
MCV: 97 fL (ref 79–97)
Platelets: 261 10*3/uL (ref 150–450)
RBC: 3.64 x10E6/uL — ABNORMAL LOW (ref 4.14–5.80)
RDW: 14.1 % (ref 11.6–15.4)
WBC: 9 10*3/uL (ref 3.4–10.8)

## 2024-02-27 NOTE — Patient Instructions (Signed)
 Will repeat the PA/lateral chest x-ray this afternoon.  Plan follow-up next week regarding the right lower extremity seroma and left pleural effusion  No change in medications or treatment from CT surgery standpoint.

## 2024-02-27 NOTE — Progress Notes (Signed)
 HPI: [name] is an 83 year old gentleman with a past history of hypertension, type 2 diabetes mellitus, stage IIIa CKD, atrial fibrillation, and coronary artery disease. He is status post CABG x 4 with Maze procedure and application of a left atrial appendage clip by Dr. Cliffton Asters on 01/21/2024. He was discharged home on postop day 7 and about a week later had a left thoracentesis yielding about 1.3 L of serous fluid.  He was last seen by Dr. Cliffton Asters on 02/14/2024 and was doing well at that time but returned to the office few days later when he noticed a bulge overlying the right medial thigh EVH tunnel.  He also reported persistent shortness of breath with exertion and cough.  His chest x-ray showed recurrent left pleural effusion.  He was seen in the office 3 days ago for follow-up of the seroma and pleural effusion.  The right thigh seroma had enlarged compared to a week previous but still showed no evidence of infection.  This fluid collection was aspirated yielding about 65 mL of thin serous fluid.  Gram stain showed few white blood cells but the culture was negative for any microorganisms.  He was asked to keep the right lower extremity to the level of the seroma lightly wrapped with an Ace bandage to keep the tissues compressed to help avoid reaccumulation.  At that visit, we also arrange for him to have a left thoracentesis by interventional radiology which was done on 02/25/24 yielding 1.4 L of fluid. [name] phoned the office yesterday reporting swelling in his right lower extremity.  He was asked to leave the ace wrap off and keep his right lower extremity elevated is much as possible.  We arranged for lower extremity duplex scan to rule out DVT today.  He returns to the office now wound check following that study. He said as of this morning, the swelling in his right lower extremity and nearly completely resolved.  Several, however, his reaccumulated.  He also said that his shortness of  breath was much better immediately after the thoracentesis but he feels as though the fluid is reaccumulating seen is having more cough and shortness of breath with exertion.  He saw Dr. Anne Fu yesterday and was found to be in atrial flutter.  DC cardioversion is planned for Tuesday, 03/03/2024.  Current Outpatient Medications  Medication Sig Dispense Refill   acetaminophen (TYLENOL) 500 MG tablet Take 1-2 tablets (500-1,000 mg total) by mouth every 6 (six) hours as needed.     amiodarone (PACERONE) 200 MG tablet Take 1 tablet (200 mg total) by mouth daily. 30 tablet 1   amoxicillin (AMOXIL) 500 MG capsule Take 2,000 mg by mouth See admin instructions. As needed for dental procedure     apixaban (ELIQUIS) 5 MG TABS tablet Take 1 tablet (5 mg total) by mouth 2 (two) times daily. 180 tablet 3   aspirin EC 81 MG tablet Take 81 mg by mouth daily.     atorvastatin (LIPITOR) 40 MG tablet Take 1 tablet (40 mg total) by mouth at bedtime. 30 tablet 3   carisoprodol (SOMA) 250 MG tablet Take 250 mg by mouth at bedtime as needed (muscle spasms).     diphenhydrAMINE (BENADRYL) 25 mg capsule Take 1 capsule (25 mg total) by mouth at bedtime as needed for sleep.     famotidine (PEPCID) 20 MG tablet Take 1 tablet (20 mg total) by mouth daily. (Patient taking differently: Take 20 mg by mouth daily as needed for heartburn or  indigestion.) 30 tablet 1   fluticasone (FLONASE) 50 MCG/ACT nasal spray Place 1 spray into both nostrils daily as needed for allergies.  3   Inulin (FIBER CHOICE PO) Take 2 tablets by mouth daily.     JANUVIA 100 MG tablet Take 100 mg by mouth daily.     levothyroxine (SYNTHROID) 75 MCG tablet Take 75 mcg by mouth daily before breakfast.     Magnesium 500 MG CAPS Take 1,000 mg by mouth daily.     metFORMIN (GLUCOPHAGE) 1000 MG tablet Take 1 tablet by mouth 2 (two) times daily.     methylPREDNISolone (MEDROL DOSEPAK) 4 MG TBPK tablet Take as directed until finished beginning the day of the left  thoracentesis. 1 each 0   metoprolol succinate (TOPROL XL) 50 MG 24 hr tablet Take 1 tablet (50 mg total) by mouth in the morning and at bedtime. 180 tablet 3   NONFORMULARY OR COMPOUNDED ITEM Topical antifungal topical solution: terbinafine 3%, fluconazole 2%, tea tree oil 5%, Urea 10%, ibuprofen2% in DMSO suspension. Sig: Apply to the affected toenail(s) once daily.  Order sent to [facility] [address] [place], [place] [address] 30 each 5   Omega-3 Fatty Acids (OMEGA-3 FISH OIL PO) Take 4,000 mg by mouth at bedtime.     ONE TOUCH ULTRA TEST test strip USE 1 TO 2 TIMES A DAY  5   Probiotic Product (PROBIOTIC DAILY PO) Take 1 capsule by mouth daily.     No current facility-administered medications for this visit.    Physical Exam: Vital signs BP 130/82 Heart rate 110 Respirations 20 SpO2 100% on room air  General: No acute distress, skin is warm and dry Chest: Normal work of breathing Extremities: The right thigh fluid collection has reaccumulated measuring about 3 cm in width by 8 cm in length.  There is no erythema, no drainage, and no induration.  There is trace bilateral pretibial edema.  Diagnostic Tests: Preliminary result of the right lower extremity venous duplex scan:  Lower Venous DVT Study   Patient Name:  Darin Mcclure  Date of Exam:   02/27/2024  Medical Rec #: [id number]          Accession #:    [id number]  Date of Birth: 12-10-1940         Patient Gender: M  Patient Age:   85 years  Exam Location:  [facility]  Procedure:      VAS [place] LOWER EXTREMITY VENOUS (DVT)  Referring Phys: HARRELL LIGHTFOOT    ---------------------------------------------------------------------------  -----    Indications: Edema, and s/p CABG with b/l GSV harvest 01/21/2024.    Limitations: Poor ultrasound/tissue interface.  Comparison Study: No prior study   Performing Technologist: Gertie Fey MHA, RDMS, RVT, RDCS     Examination Guidelines:  A complete  evaluation includes B-mode imaging, spectral Doppler, color  Doppler,  and power Doppler as needed of all accessible portions of each vessel.  Bilateral  testing is considered an integral part of a complete examination. Limited  examinations for reoccurring indications may be performed as noted. The  reflux  portion of the exam is performed with the patient in reverse  Trendelenburg.   Summary:  RIGHT:  - There is no evidence of deep vein thrombosis in the lower extremity.    - No cystic structure found in the popliteal fossa.  - Anechoic area of the right distal medial thigh measuring 4.6 x 2.2 cm,  suggestive of seroma.    LEFT:   -  There is no evidence of deep vein thrombosis in the lower extremity.    - No cystic structure found in the popliteal fossa.  - Anechoic area of the left distal medial thigh measuring 3.5 x 0.79 cm,     suggestive of seroma.    Impression / Plan: [name] is about 6 weeks status post coronary bypass grafting along with Maze procedure and application of a left atrial appendage clip.  He has had a left pleural effusion drained twice. Based on his symptoms, it is likely recurring once again 3 days after the most recent drainage by interventional radiology.  The right thigh seroma has also reaccumulated. Will get a chest x-ray this afternoon to check the status of the pleural effusion.  It is doubtful that simple drainage of the pleural space and also the right thigh seroma would having any lasting benefit at this stage.  Suspect he will require incision and drainage of the right thigh fluid collection in the operating room followed by treatment with a wound vac. He would also likely benefit from a left Pleurx catheter.  Will discuss with Dr. Cliffton Asters and schedule follow-up with [name] in the office next week.   Leary Roca, PA-C [facility] [phone]

## 2024-02-28 LAB — WOUND CULTURE
MICRO NUMBER:: 16267946
RESULT:: NO GROWTH
SPECIMEN QUALITY:: ADEQUATE

## 2024-02-28 LAB — BASIC METABOLIC PANEL WITH GFR
BUN/Creatinine Ratio: 16 (ref 10–24)
BUN: 22 mg/dL (ref 8–27)
CO2: 24 mmol/L (ref 20–29)
Calcium: 9.3 mg/dL (ref 8.6–10.2)
Chloride: 99 mmol/L (ref 96–106)
Creatinine, Ser: 1.35 mg/dL — ABNORMAL HIGH (ref 0.76–1.27)
Glucose: 105 mg/dL — ABNORMAL HIGH (ref 70–99)
Potassium: 4.8 mmol/L (ref 3.5–5.2)
Sodium: 140 mmol/L (ref 134–144)
eGFR: 53 mL/min/{1.73_m2} — ABNORMAL LOW (ref >59)

## 2024-03-02 ENCOUNTER — Telehealth: Payer: Self-pay | Admitting: Cardiology

## 2024-03-02 ENCOUNTER — Ambulatory Visit

## 2024-03-02 ENCOUNTER — Other Ambulatory Visit: Payer: Self-pay | Admitting: Thoracic Surgery (Cardiothoracic Vascular Surgery)

## 2024-03-02 DIAGNOSIS — Z951 Presence of aortocoronary bypass graft: Secondary | ICD-10-CM

## 2024-03-02 NOTE — Progress Notes (Signed)
 Called patient with pre-procedure instructions for tomorrow.   Patient informed of:   Time to arrive for procedure. 0830 Remain NPO past midnight.  Must have a ride home and a responsible adult to remain with them for 24 hours post procedure.  Confirmed blood thinner. Eliquis Confirmed no breaks in taking blood thinner for 3+ weeks prior to procedure. Confirmed patient stopped all GLP-1s and GLP-2s for at least one week before procedure.

## 2024-03-02 NOTE — Telephone Encounter (Signed)
 Pt advised his BMET results and verbalized understanding.

## 2024-03-02 NOTE — Telephone Encounter (Signed)
 Patient returned staff call regarding results.

## 2024-03-03 ENCOUNTER — Other Ambulatory Visit: Payer: Self-pay

## 2024-03-03 ENCOUNTER — Encounter (HOSPITAL_COMMUNITY)
Admission: RE | Disposition: A | Discharge status: Home / Self Care | Payer: Self-pay | Source: Home / Self Care | Attending: Cardiology

## 2024-03-03 ENCOUNTER — Ambulatory Visit (HOSPITAL_COMMUNITY)
Admission: RE | Admit: 2024-03-03 | Discharge: 2024-03-03 | Disposition: A | Discharge status: Home / Self Care | Attending: Cardiology | Admitting: Cardiology

## 2024-03-03 ENCOUNTER — Ambulatory Visit (HOSPITAL_COMMUNITY): Admitting: Anesthesiology

## 2024-03-03 ENCOUNTER — Encounter (HOSPITAL_COMMUNITY): Payer: Self-pay | Admitting: Cardiology

## 2024-03-03 DIAGNOSIS — I483 Typical atrial flutter: Secondary | ICD-10-CM | POA: Insufficient documentation

## 2024-03-03 DIAGNOSIS — I4892 Unspecified atrial flutter: Secondary | ICD-10-CM | POA: Diagnosis not present

## 2024-03-03 DIAGNOSIS — K219 Gastro-esophageal reflux disease without esophagitis: Secondary | ICD-10-CM | POA: Insufficient documentation

## 2024-03-03 DIAGNOSIS — I10 Essential (primary) hypertension: Secondary | ICD-10-CM | POA: Diagnosis not present

## 2024-03-03 DIAGNOSIS — E119 Type 2 diabetes mellitus without complications: Secondary | ICD-10-CM | POA: Diagnosis not present

## 2024-03-03 DIAGNOSIS — Z7901 Long term (current) use of anticoagulants: Secondary | ICD-10-CM | POA: Insufficient documentation

## 2024-03-03 DIAGNOSIS — I779 Disorder of arteries and arterioles, unspecified: Secondary | ICD-10-CM | POA: Diagnosis not present

## 2024-03-03 DIAGNOSIS — Z7984 Long term (current) use of oral hypoglycemic drugs: Secondary | ICD-10-CM | POA: Diagnosis not present

## 2024-03-03 DIAGNOSIS — Z79899 Other long term (current) drug therapy: Secondary | ICD-10-CM | POA: Insufficient documentation

## 2024-03-03 DIAGNOSIS — Z951 Presence of aortocoronary bypass graft: Secondary | ICD-10-CM | POA: Diagnosis not present

## 2024-03-03 DIAGNOSIS — E785 Hyperlipidemia, unspecified: Secondary | ICD-10-CM | POA: Insufficient documentation

## 2024-03-03 DIAGNOSIS — I2510 Atherosclerotic heart disease of native coronary artery without angina pectoris: Secondary | ICD-10-CM | POA: Diagnosis not present

## 2024-03-03 HISTORY — PX: CARDIOVERSION: EP1203

## 2024-03-03 LAB — GLUCOSE, CAPILLARY: Glucose-Capillary: 108 mg/dL — ABNORMAL HIGH (ref 70–99)

## 2024-03-03 SURGERY — CARDIOVERSION (CATH LAB)
Anesthesia: General

## 2024-03-03 MED ORDER — LIDOCAINE 2% (20 MG/ML) 5 ML SYRINGE
INTRAMUSCULAR | Status: DC | PRN
  Administered 2024-03-03: 40 mg via INTRAVENOUS

## 2024-03-03 MED ORDER — SODIUM CHLORIDE 0.9% FLUSH: 3.0000 mL | Freq: Two times a day (BID) | INTRAVENOUS | Status: DC

## 2024-03-03 MED ORDER — SODIUM CHLORIDE 0.9% FLUSH: 3.0000 mL | INTRAVENOUS | Status: DC | PRN

## 2024-03-03 MED ORDER — PROPOFOL 10 MG/ML IV BOLUS
INTRAVENOUS | Status: DC | PRN
Start: 2024-03-03 — End: 2024-03-03
  Administered 2024-03-03: 60 mg via INTRAVENOUS

## 2024-03-03 SURGICAL SUPPLY — 1 items: PAD DEFIB RADIO PHYSIO CONN (PAD) ×1 IMPLANT

## 2024-03-03 NOTE — Transfer of Care (Signed)
 Immediate Anesthesia Transfer of Care Note  Patient: Darin Mcclure  Procedure(s) Performed: CARDIOVERSION  Patient Location: Cath Lab  Anesthesia Type:MAC  Level of Consciousness: awake, alert , and oriented  Airway & Oxygen Therapy: Patient Spontanous Breathing and Patient connected to nasal cannula oxygen  Post-op Assessment: Report given to RN and Post -op Vital signs reviewed and stable  Post vital signs: Reviewed and stable  Last Vitals:  Vitals Value Taken Time  BP 94/68 1003  Temp    Pulse 83 03/03/24 1003  Resp 19 03/03/24 1003  SpO2 94 % 03/03/24 1003  Vitals shown include unfiled device data.  Last Pain:  Vitals:   03/03/24 0905  TempSrc:   PainSc: 0-No pain         Complications: No notable events documented.

## 2024-03-03 NOTE — Interval H&P Note (Signed)
 History and Physical Interval Note:  03/03/2024 9:12 AM  Darin Mcclure  has presented today for surgery, with the diagnosis of atrial flutter.  The various methods of treatment have been discussed with the patient and family. After consideration of risks, benefits and other options for treatment, the patient has consented to  Procedure(s): CARDIOVERSION (N/A) as a surgical intervention.  The patient's history has been reviewed, patient examined, no change in status, stable for surgery.  I have reviewed the patient's chart and labs.  Questions were answered to the patient's satisfaction.     Kari Montero

## 2024-03-03 NOTE — CV Procedure (Signed)
   Electrical Cardioversion Procedure Note Jenna Ardoin [id number] 19-May-1941  Procedure: Electrical Cardioversion Indications:  Atrial Flutter  Time Out: Verified patient identification, verified procedure,medications/allergies/relevent history reviewed, required imaging and test results available.  Performed  Procedure Details  The patient was NPO after midnight. Anesthesia was administered at the beside  by Dr.Hollis.  Cardioversion was done with synchronized biphasic defibrillation with AP pads with 200 Joules x1.  The patient converted to normal sinus rhythm. The patient tolerated the procedure well   IMPRESSION:  Successful cardioversion of atrial fibrillation    Loyce Flaming 03/03/2024, 10:28 AM

## 2024-03-03 NOTE — Anesthesia Preprocedure Evaluation (Addendum)
 Anesthesia Evaluation  Patient identified by MRN, date of birth, ID band Patient awake    Reviewed: Allergy & Precautions, NPO status , Patient's Chart, lab work & pertinent test results  Airway Mallampati: II  TM Distance: >3 FB Neck ROM: Full    Dental  (+) Teeth Intact, Dental Advisory Given   Pulmonary neg pulmonary ROS   breath sounds clear to auscultation       Cardiovascular hypertension, Pt. on home beta blockers + CAD and + CABG  + dysrhythmias Atrial Fibrillation  Rhythm:Irregular Rate:Normal  Echo:   1. Left ventricular ejection fraction, by estimation, is 65 to 70%. The  left ventricle has normal function. The left ventricle has no regional  wall motion abnormalities. There is moderate left ventricular hypertrophy.  Left ventricular diastolic  parameters are indeterminate.   2. Right ventricular systolic function is normal. The right ventricular  size is normal. There is normal pulmonary artery systolic pressure. The  estimated right ventricular systolic pressure is 24.9 mmHg.   3. Left atrial size was moderately dilated.   4. The mitral valve is degenerative. Trivial mitral valve regurgitation.  Mild to moderate mitral stenosis. The mean mitral valve gradient is 5.0  mmHg. Severe mitral annular calcification.   5. The aortic valve is normal in structure. Aortic valve regurgitation is  not visualized. No aortic stenosis is present.   6. The inferior vena cava is normal in size with greater than 50%  respiratory variability, suggesting right atrial pressure of 3 mmHg.      Neuro/Psych  Neuromuscular disease  negative psych ROS   GI/Hepatic Neg liver ROS,GERD  Medicated,,  Endo/Other  diabetes, Type 2, Oral Hypoglycemic AgentsHypothyroidism    Renal/GU Renal disease     Musculoskeletal  (+) Arthritis ,    Abdominal   Peds  Hematology negative hematology ROS (+)   Anesthesia Other Findings    Reproductive/Obstetrics                             Anesthesia Physical Anesthesia Plan  ASA: 3  Anesthesia Plan: General   Post-op Pain Management: Minimal or no pain anticipated   Induction: Intravenous  PONV Risk Score and Plan: 0 and Propofol infusion  Airway Management Planned: Natural Airway and Simple Face Mask  Additional Equipment: None  Intra-op Plan:   Post-operative Plan:   Informed Consent: I have reviewed the patients History and Physical, chart, labs and discussed the procedure including the risks, benefits and alternatives for the proposed anesthesia with the patient or authorized representative who has indicated his/her understanding and acceptance.       Plan Discussed with: CRNA  Anesthesia Plan Comments:        Anesthesia Quick Evaluation

## 2024-03-03 NOTE — Anesthesia Postprocedure Evaluation (Addendum)
 Anesthesia Post Note  Patient: Darin Mcclure  Procedure(s) Performed: CARDIOVERSION     Patient location during evaluation: PACU Anesthesia Type: General Level of consciousness: awake and alert Pain management: pain level controlled Vital Signs Assessment: post-procedure vital signs reviewed and stable Respiratory status: spontaneous breathing, nonlabored ventilation, respiratory function stable and patient connected to nasal cannula oxygen Cardiovascular status: blood pressure returned to baseline and stable Postop Assessment: no apparent nausea or vomiting Anesthetic complications: no  There were no known notable events for this encounter.  Last Vitals:  Vitals:   03/03/24 1035 03/03/24 1040  BP: 122/65 112/69  Pulse: (!) 59 63  Resp: 15 12  Temp:    SpO2: 97% 97%    Last Pain:  Vitals:   03/03/24 1015  TempSrc:   PainSc: Asleep                 Shelton Silvas

## 2024-03-03 NOTE — Addendum Note (Signed)
 Addendum  created 03/03/24 1306 by Shelton Silvas, MD   Clinical Note Signed, SmartForm saved

## 2024-03-04 ENCOUNTER — Inpatient Hospital Stay (HOSPITAL_COMMUNITY)
Admission: EM | Admit: 2024-03-04 | Discharge: 2024-03-06 | DRG: 292 | Disposition: A | Discharge status: Home Health | Attending: Internal Medicine | Admitting: Internal Medicine

## 2024-03-04 ENCOUNTER — Encounter (HOSPITAL_COMMUNITY): Payer: Self-pay | Admitting: *Deleted

## 2024-03-04 ENCOUNTER — Ambulatory Visit
Admission: RE | Admit: 2024-03-04 | Discharge: 2024-03-04 | Disposition: A | Discharge status: Home / Self Care | Source: Ambulatory Visit | Attending: Thoracic Surgery (Cardiothoracic Vascular Surgery)

## 2024-03-04 ENCOUNTER — Other Ambulatory Visit: Payer: Self-pay

## 2024-03-04 ENCOUNTER — Telehealth: Payer: Self-pay | Admitting: *Deleted

## 2024-03-04 ENCOUNTER — Other Ambulatory Visit: Payer: Self-pay | Admitting: *Deleted

## 2024-03-04 ENCOUNTER — Telehealth: Payer: Self-pay | Admitting: Cardiology

## 2024-03-04 ENCOUNTER — Emergency Department (HOSPITAL_COMMUNITY)

## 2024-03-04 DIAGNOSIS — I071 Rheumatic tricuspid insufficiency: Secondary | ICD-10-CM | POA: Diagnosis present

## 2024-03-04 DIAGNOSIS — E1142 Type 2 diabetes mellitus with diabetic polyneuropathy: Secondary | ICD-10-CM | POA: Diagnosis present

## 2024-03-04 DIAGNOSIS — Z7984 Long term (current) use of oral hypoglycemic drugs: Secondary | ICD-10-CM

## 2024-03-04 DIAGNOSIS — Z96643 Presence of artificial hip joint, bilateral: Secondary | ICD-10-CM | POA: Diagnosis present

## 2024-03-04 DIAGNOSIS — I509 Heart failure, unspecified: Secondary | ICD-10-CM | POA: Diagnosis not present

## 2024-03-04 DIAGNOSIS — Z7982 Long term (current) use of aspirin: Secondary | ICD-10-CM

## 2024-03-04 DIAGNOSIS — I4510 Unspecified right bundle-branch block: Secondary | ICD-10-CM | POA: Diagnosis present

## 2024-03-04 DIAGNOSIS — Z79890 Hormone replacement therapy (postmenopausal): Secondary | ICD-10-CM

## 2024-03-04 DIAGNOSIS — I050 Rheumatic mitral stenosis: Secondary | ICD-10-CM | POA: Diagnosis present

## 2024-03-04 DIAGNOSIS — Z801 Family history of malignant neoplasm of trachea, bronchus and lung: Secondary | ICD-10-CM

## 2024-03-04 DIAGNOSIS — I5031 Acute diastolic (congestive) heart failure: Principal | ICD-10-CM | POA: Diagnosis present

## 2024-03-04 DIAGNOSIS — J90 Pleural effusion, not elsewhere classified: Secondary | ICD-10-CM

## 2024-03-04 DIAGNOSIS — E1169 Type 2 diabetes mellitus with other specified complication: Secondary | ICD-10-CM | POA: Diagnosis present

## 2024-03-04 DIAGNOSIS — R911 Solitary pulmonary nodule: Secondary | ICD-10-CM | POA: Diagnosis present

## 2024-03-04 DIAGNOSIS — E119 Type 2 diabetes mellitus without complications: Secondary | ICD-10-CM

## 2024-03-04 DIAGNOSIS — E782 Mixed hyperlipidemia: Secondary | ICD-10-CM | POA: Diagnosis present

## 2024-03-04 DIAGNOSIS — I2510 Atherosclerotic heart disease of native coronary artery without angina pectoris: Secondary | ICD-10-CM | POA: Diagnosis present

## 2024-03-04 DIAGNOSIS — Z951 Presence of aortocoronary bypass graft: Secondary | ICD-10-CM

## 2024-03-04 DIAGNOSIS — Z8249 Family history of ischemic heart disease and other diseases of the circulatory system: Secondary | ICD-10-CM

## 2024-03-04 DIAGNOSIS — E1159 Type 2 diabetes mellitus with other circulatory complications: Secondary | ICD-10-CM | POA: Diagnosis present

## 2024-03-04 DIAGNOSIS — Z79899 Other long term (current) drug therapy: Secondary | ICD-10-CM

## 2024-03-04 DIAGNOSIS — I480 Paroxysmal atrial fibrillation: Secondary | ICD-10-CM | POA: Diagnosis present

## 2024-03-04 DIAGNOSIS — Z955 Presence of coronary angioplasty implant and graft: Secondary | ICD-10-CM

## 2024-03-04 DIAGNOSIS — K219 Gastro-esophageal reflux disease without esophagitis: Secondary | ICD-10-CM | POA: Diagnosis present

## 2024-03-04 DIAGNOSIS — E876 Hypokalemia: Secondary | ICD-10-CM | POA: Diagnosis present

## 2024-03-04 DIAGNOSIS — E039 Hypothyroidism, unspecified: Secondary | ICD-10-CM | POA: Diagnosis present

## 2024-03-04 DIAGNOSIS — I97641 Postprocedural seroma of a circulatory system organ or structure following cardiac bypass: Secondary | ICD-10-CM

## 2024-03-04 DIAGNOSIS — J918 Pleural effusion in other conditions classified elsewhere: Secondary | ICD-10-CM | POA: Diagnosis present

## 2024-03-04 DIAGNOSIS — I152 Hypertension secondary to endocrine disorders: Secondary | ICD-10-CM | POA: Diagnosis present

## 2024-03-04 DIAGNOSIS — Z7901 Long term (current) use of anticoagulants: Secondary | ICD-10-CM

## 2024-03-04 DIAGNOSIS — I4892 Unspecified atrial flutter: Secondary | ICD-10-CM | POA: Diagnosis present

## 2024-03-04 DIAGNOSIS — Z96652 Presence of left artificial knee joint: Secondary | ICD-10-CM | POA: Diagnosis present

## 2024-03-04 DIAGNOSIS — R0609 Other forms of dyspnea: Principal | ICD-10-CM

## 2024-03-04 LAB — BASIC METABOLIC PANEL WITH GFR
Anion gap: 11 (ref 5–15)
BUN: 18 mg/dL (ref 8–23)
CO2: 24 mmol/L (ref 22–32)
Calcium: 8.9 mg/dL (ref 8.9–10.3)
Chloride: 103 mmol/L (ref 98–111)
Creatinine, Ser: 1.6 mg/dL — ABNORMAL HIGH (ref 0.61–1.24)
GFR, Estimated: 43 mL/min — ABNORMAL LOW (ref >60)
Glucose, Bld: 124 mg/dL — ABNORMAL HIGH (ref 70–99)
Potassium: 4.9 mmol/L (ref 3.5–5.1)
Sodium: 138 mmol/L (ref 135–145)

## 2024-03-04 LAB — BRAIN NATRIURETIC PEPTIDE: B Natriuretic Peptide: 179.7 pg/mL — ABNORMAL HIGH (ref 0.0–100.0)

## 2024-03-04 LAB — CBC
HCT: 35.3 % — ABNORMAL LOW (ref 39.0–52.0)
Hemoglobin: 11 g/dL — ABNORMAL LOW (ref 13.0–17.0)
MCH: 29.8 pg (ref 26.0–34.0)
MCHC: 31.2 g/dL (ref 30.0–36.0)
MCV: 95.7 fL (ref 80.0–100.0)
Platelets: 184 10*3/uL (ref 150–400)
RBC: 3.69 MIL/uL — ABNORMAL LOW (ref 4.22–5.81)
RDW: 15.1 % (ref 11.5–15.5)
WBC: 6.6 10*3/uL (ref 4.0–10.5)
nRBC: 0 % (ref 0.0–0.2)

## 2024-03-04 LAB — TROPONIN I (HIGH SENSITIVITY)
Troponin I (High Sensitivity): 22 ng/L — ABNORMAL HIGH (ref <18)
Troponin I (High Sensitivity): 25 ng/L — ABNORMAL HIGH (ref <18)

## 2024-03-04 NOTE — Telephone Encounter (Signed)
 Spoke patient and he states since cardioversion yesterday he has been experiencing SOB. He states he can not walk more than 10 ft without getting out of breath. He also feels like he can not take a deep breath. Currently sitting down so he is not SOB. He states he did not have the SOB until after cardioversion. Denies any chest pain. He would like to know if that is normal after cardioversion. HR is 62 oxygen is at 98%  ED precautions discussed

## 2024-03-04 NOTE — Telephone Encounter (Signed)
 Reviewed CXR - Moderate left sided pleural effusion (similar to prior effusion that throcentesis was performed on .   Let's give lasix 40mg  BID for the next 7 days then 40mg  once a day thereafter.   Have him follow up in 3-5 days in office.   Call him tomorrow to see how he is doing.  Patient refuses ER  Thanks.   Donato Schultz, MD

## 2024-03-04 NOTE — Telephone Encounter (Signed)
 Pt c/o Shortness Of Breath: STAT if SOB developed within the last 24 hours or pt is noticeably SOB on the phone  1. Are you currently SOB (can you hear that pt is SOB on the phone)? Yes   2. How long have you been experiencing SOB? Since yesterday  3. Are you SOB when sitting or when up moving around? Moving around   4. Are you currently experiencing any other symptoms? Pt had his cardioversion yesterday since then she gets SOB when he walks around, even from a chair to get answer the phone he get SOB, his HR 62

## 2024-03-04 NOTE — ED Provider Notes (Signed)
 [place] EMERGENCY DEPARTMENT AT [facility] Provider Note  CSN: [id number] Arrival date & time: 03/04/24 1523  Chief Complaint(s) Shortness of Breath  HPI Darin Mcclure is a 83 y.o. 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) 08/26/2017   GERD (gastroesophageal reflux disease)    Heart disease    Hyperglycemia    Hypertension    Mixed hyperlipidemia    Muscle cramp 08/26/2017   Neuropathy    Osteoarthritis    RBBB    RBBB (right bundle branch block)    Type 2 diabetes mellitus (HCC)    Patient Active Problem List   Diagnosis Date Noted   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   Testing of male for genetic disease carrier status 01/26/2024   History of left atrial appendage closure 01/26/2024   Coronary artery disease 01/21/2024   New onset atrial fibrillation (HCC) 01/14/2024   Influenza A 01/14/2024   Type 2 diabetes mellitus (HCC) 01/14/2024   Hypertension associated with diabetes (HCC) 01/14/2024   Chronic kidney disease, stage 3a (HCC) 01/14/2024   Hypothyroidism 01/14/2024   Hyperlipidemia associated with type 2 diabetes mellitus (HCC) 01/14/2024   Coronary artery disease involving native coronary artery of native heart without angina pectoris 12/29/2021   Pure hypercholesterolemia 12/29/2021   Right bundle branch block 12/29/2021   Carotid artery disease (HCC) 12/29/2021   Gastroesophageal reflux disease without esophagitis 04/19/2021   Impingement syndrome of left shoulder region 05/17/2020   History of prosthetic unicompartmental arthroplasty of left knee 11/18/2019   Pain in left knee 09/24/2018   Pain of left hip joint 09/24/2018   Diabetic  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) 08/26/2017   Muscle cramp 08/26/2017   Home Medication(s) Prior to Admission medications   Medication Sig Start Date End Date Taking? Authorizing Provider  acetaminophen (TYLENOL) 500 MG tablet Take 1-2 tablets (500-1,000 mg total) by mouth every 6 (six) hours as needed. 01/25/24   Barrett, Rae Roam, PA-C  amiodarone (PACERONE) 200 MG tablet Take 1 tablet (200 mg total) by mouth daily. 01/25/24   Barrett, Erin R, PA-C  amoxicillin (AMOXIL) 500 MG capsule Take 2,000 mg by mouth See admin instructions. As needed for dental procedure 03/27/19   [provider]  apixaban (ELIQUIS) 5 MG TABS tablet Take 1 tablet (5 mg total) by mouth 2 (two) times daily. 02/26/24   Jake Bathe, MD  aspirin EC 81 MG tablet Take 81 mg by mouth daily.    [provider]  atorvastatin (LIPITOR) 40 MG tablet Take 1 tablet (40 mg total) by mouth at bedtime. 01/25/24   Barrett, Rae Roam, PA-C  carisoprodol (SOMA) 250 MG tablet Take 250 mg by mouth at bedtime as needed (muscle spasms). 04/19/21   [provider]  diphenhydrAMINE (BENADRYL) 25 mg capsule Take 1 capsule (25 mg total) by mouth at bedtime as needed for sleep. 01/26/24   Barrett, Erin R, PA-C  famotidine (PEPCID) 20 MG tablet Take 1 tablet (20 mg total) by mouth daily. Patient taking differently: Take 20 mg by mouth daily as needed for heartburn or indigestion. 02/05/22   Arnaldo Natal, NP  fluticasone (  FLONASE) 50 MCG/ACT nasal spray Place 1 spray into both nostrils daily as needed for allergies. 08/08/17   [provider]  Inulin (FIBER CHOICE PO) Take 2 tablets by mouth daily.    [provider]  JANUVIA 100 MG tablet Take 100 mg by mouth daily. 08/19/21   [provider]  levothyroxine (SYNTHROID) 75 MCG tablet Take 75 mcg by mouth daily before breakfast. 04/12/20   [provider]  Magnesium 500 MG CAPS Take 1,000 mg by  mouth daily.    [provider]  metFORMIN (GLUCOPHAGE) 1000 MG tablet Take 1 tablet by mouth 2 (two) times daily. 04/30/22   [provider]  methylPREDNISolone (MEDROL DOSEPAK) 4 MG TBPK tablet Take as directed until finished beginning the day of the left thoracentesis. 02/24/24   Leary Roca, PA-C  metoprolol succinate (TOPROL XL) 50 MG 24 hr tablet Take 1 tablet (50 mg total) by mouth in the morning and at bedtime. 02/10/24   Swinyer, Zachary George, NP  NONFORMULARY OR COMPOUNDED ITEM Topical antifungal topical solution: terbinafine 3%, fluconazole 2%, tea tree oil 5%, Urea 10%, ibuprofen2% in DMSO suspension. Sig: Apply to the affected toenail(s) once daily.  Order sent to [facility] [address] [place], [place] [address] 01/16/24   [name], DPM  Omega-3 Fatty Acids (OMEGA-3 FISH OIL PO) Take 4,000 mg by mouth at bedtime.    [provider]  ONE TOUCH ULTRA TEST test strip USE 1 TO 2 TIMES A DAY 08/29/18   [provider]  Probiotic Product (PROBIOTIC DAILY PO) Take 1 capsule by mouth daily.    [provider]                                                                                                                                    Allergies Patient has no known allergies.  Review of Systems Review of Systems  Respiratory:  Positive for shortness of breath.    As noted in HPI  Physical Exam Vital Signs  I have reviewed the triage vital signs BP 129/60 (BP Location: Right Arm)   Pulse 64   Temp 97.9 F (36.6 C) (Oral)   Resp 18   Ht 6' (1.829 m)   Wt 100.7 kg   SpO2 96%   BMI 30.11 kg/m  *** Physical Exam  ED Results and Treatments Labs (all labs ordered are listed, but only abnormal results are displayed) Labs Reviewed  BASIC METABOLIC PANEL WITH GFR - Abnormal; Notable for the following components:      Result Value   Glucose, Bld 124 (*)    Creatinine, Ser 1.60 (*)    GFR, Estimated 43  (*)    All other components within normal limits  CBC - Abnormal; Notable for the following components:   RBC 3.69 (*)    Hemoglobin 11.0 (*)    HCT 35.3 (*)    All  other components within normal limits  BRAIN NATRIURETIC PEPTIDE - Abnormal; Notable for the following components:   B Natriuretic Peptide 179.7 (*)    All other components within normal limits  TROPONIN I (HIGH SENSITIVITY) - Abnormal; Notable for the following components:   Troponin I (High Sensitivity) 22 (*)    All other components within normal limits  TROPONIN I (HIGH SENSITIVITY) - Abnormal; Notable for the following components:   Troponin I (High Sensitivity) 25 (*)    All other components within normal limits                                                                                                                         EKG  EKG Interpretation Date/Time:    Ventricular Rate:    PR Interval:    QRS Duration:    QT Interval:    QTC Calculation:   R Axis:      Text Interpretation:         Radiology DG Chest 2 View Result Date: 03/04/2024 CLINICAL DATA:  Shortness of breath.  Pleural effusion EXAM: CHEST - 2 VIEW COMPARISON:  X-ray 02/27/2024 and older FINDINGS: Postop chest. Atrial occlusion clip. Stable cardiopericardial silhouette calcified aorta. Increasing now moderate left effusion with adjacent lung opacity. No pneumothorax or edema. Dense left upper lobe lung nodule, likely calcified. Right lung is grossly clear. Degenerative changes of the spine. IMPRESSION: Postop chest.  Moderate left effusion. Electronically Signed   By: Karen Kays M.D.   On: 03/04/2024 12:16    Medications Ordered in ED Medications - No data to display Procedures Procedures  (including critical care time) Medical Decision Making / ED Course   Medical Decision Making   ***    Final Clinical Impression(s) / ED Diagnoses Final diagnoses:  None    This chart was dictated using voice recognition software.   Despite best efforts to proofread,  errors can occur which can change the documentation meaning.

## 2024-03-04 NOTE — Telephone Encounter (Signed)
 Spoke with patient and informed him provider recommends for him to go to ED to be evaluated. He verbalized understanding

## 2024-03-04 NOTE — ED Provider Triage Note (Signed)
 Emergency Medicine Provider Triage Evaluation Note  Vega Withrow , a 83 y.o. male  was evaluated in triage.  Pt complains of shortness of breath. Patient here with concerns of primarily exertional shortness of breath worsening in the last day since being cardioverted. He reports he is compliant with blood thinners. No SOB at rest.  Review of Systems  Positive: As above Negative: As above  Physical Exam  BP (!) 160/80   Pulse 64   Temp 97.7 F (36.5 C)   Resp 17   Ht 6' (1.829 m)   Wt 100.7 kg   SpO2 100%   BMI 30.11 kg/m  Gen:   Awake, no distress   Resp:  Normal effort  MSK:   Moves extremities without difficulty  Other:    Medical Decision Making  Medically screening exam initiated at 4:43 PM.  Appropriate orders placed.  [name] was informed that the remainder of the evaluation will be completed by another provider, this initial triage assessment does not replace that evaluation, and the importance of remaining in the ED until their evaluation is complete.     Smitty Knudsen, PA-C 03/04/24 1644

## 2024-03-04 NOTE — Telephone Encounter (Signed)
 If he can not walk more than 10 feet without shortness of breath, he needs to go to ER for evaluation. Thanks Donato Schultz, MD   Spoke with patient and advised of Dr Anne Fu recommendations.  Pt is very frustrated that he can not come into the office for evaluation and does not want to go into the ED.  He reports he had a CXR this AM ordered by Dr Cliffton Asters   IMPRESSION: Postop chest.  Moderate left effusion.  And reports he was told instructed by Dr Lucilla Lame office to contact cardiology to be seen.  Pt states he wants a CT ordered.  Advised pt again to report to ED for further evaluation as instructed by Dr Anne Fu.  Informed pt he needs evaluation to determine what testing would be best for him and the only way to do this is to go to the closest ED.  Pt refuses and stated the last time he went there he had to wait 19 hours and no one did any thing for him.  Pt became more frustrated and ultimately disconnected the call abruptly.  Will forward to Dr Anne Fu for his knowledge.

## 2024-03-04 NOTE — ED Triage Notes (Signed)
 The pt had by-pass surgery 6 weeks ago and yesterday he was cardioverted and since then he has had exertional sob he called his doctors and was told to come here no visible sob just sitting

## 2024-03-04 NOTE — Telephone Encounter (Signed)
 Pt chart - pt is currently in ED for evaluation and treatment.

## 2024-03-04 NOTE — Telephone Encounter (Signed)
 Patient contacted the office stating he is experiencing SOB post cardioversion yesterday. States he can not walk 10 feet without getting winded. States he was fine until after the cardioversion. Patient with h/o pleural effusion. Patient received cxr and images reviewed with Gaynelle Arabian, PA. Advised patient that left pleural effusion is re-accumulating but is not as large as it was prior to last thoracentesis. Advised if he is having a significant change in respiratory status since yesterday he should contact his Cardiologist or be evaluated in the ED. Patient acknowledged receipt.

## 2024-03-05 ENCOUNTER — Inpatient Hospital Stay (HOSPITAL_COMMUNITY)

## 2024-03-05 DIAGNOSIS — R911 Solitary pulmonary nodule: Secondary | ICD-10-CM | POA: Diagnosis present

## 2024-03-05 DIAGNOSIS — L7634 Postprocedural seroma of skin and subcutaneous tissue following other procedure: Secondary | ICD-10-CM | POA: Diagnosis not present

## 2024-03-05 DIAGNOSIS — I509 Heart failure, unspecified: Secondary | ICD-10-CM

## 2024-03-05 DIAGNOSIS — Z96652 Presence of left artificial knee joint: Secondary | ICD-10-CM | POA: Diagnosis present

## 2024-03-05 DIAGNOSIS — J918 Pleural effusion in other conditions classified elsewhere: Secondary | ICD-10-CM | POA: Diagnosis present

## 2024-03-05 DIAGNOSIS — I97641 Postprocedural seroma of a circulatory system organ or structure following cardiac bypass: Secondary | ICD-10-CM

## 2024-03-05 DIAGNOSIS — Z7984 Long term (current) use of oral hypoglycemic drugs: Secondary | ICD-10-CM | POA: Diagnosis not present

## 2024-03-05 DIAGNOSIS — I4892 Unspecified atrial flutter: Secondary | ICD-10-CM | POA: Diagnosis present

## 2024-03-05 DIAGNOSIS — Z7982 Long term (current) use of aspirin: Secondary | ICD-10-CM | POA: Diagnosis not present

## 2024-03-05 DIAGNOSIS — I5031 Acute diastolic (congestive) heart failure: Secondary | ICD-10-CM | POA: Diagnosis present

## 2024-03-05 DIAGNOSIS — I4510 Unspecified right bundle-branch block: Secondary | ICD-10-CM | POA: Diagnosis present

## 2024-03-05 DIAGNOSIS — I2510 Atherosclerotic heart disease of native coronary artery without angina pectoris: Secondary | ICD-10-CM | POA: Diagnosis present

## 2024-03-05 DIAGNOSIS — I071 Rheumatic tricuspid insufficiency: Secondary | ICD-10-CM | POA: Diagnosis present

## 2024-03-05 DIAGNOSIS — Z955 Presence of coronary angioplasty implant and graft: Secondary | ICD-10-CM | POA: Diagnosis not present

## 2024-03-05 DIAGNOSIS — K219 Gastro-esophageal reflux disease without esophagitis: Secondary | ICD-10-CM | POA: Diagnosis present

## 2024-03-05 DIAGNOSIS — I152 Hypertension secondary to endocrine disorders: Secondary | ICD-10-CM | POA: Diagnosis present

## 2024-03-05 DIAGNOSIS — E876 Hypokalemia: Secondary | ICD-10-CM | POA: Diagnosis present

## 2024-03-05 DIAGNOSIS — E1142 Type 2 diabetes mellitus with diabetic polyneuropathy: Secondary | ICD-10-CM | POA: Diagnosis present

## 2024-03-05 DIAGNOSIS — R0600 Dyspnea, unspecified: Secondary | ICD-10-CM | POA: Diagnosis not present

## 2024-03-05 DIAGNOSIS — E782 Mixed hyperlipidemia: Secondary | ICD-10-CM | POA: Diagnosis present

## 2024-03-05 DIAGNOSIS — Z79899 Other long term (current) drug therapy: Secondary | ICD-10-CM | POA: Diagnosis not present

## 2024-03-05 DIAGNOSIS — E1169 Type 2 diabetes mellitus with other specified complication: Secondary | ICD-10-CM | POA: Diagnosis present

## 2024-03-05 DIAGNOSIS — J90 Pleural effusion, not elsewhere classified: Secondary | ICD-10-CM

## 2024-03-05 DIAGNOSIS — I480 Paroxysmal atrial fibrillation: Secondary | ICD-10-CM | POA: Diagnosis present

## 2024-03-05 DIAGNOSIS — Z951 Presence of aortocoronary bypass graft: Secondary | ICD-10-CM | POA: Diagnosis not present

## 2024-03-05 DIAGNOSIS — E039 Hypothyroidism, unspecified: Secondary | ICD-10-CM | POA: Diagnosis present

## 2024-03-05 DIAGNOSIS — Z79890 Hormone replacement therapy (postmenopausal): Secondary | ICD-10-CM | POA: Diagnosis not present

## 2024-03-05 DIAGNOSIS — Z7901 Long term (current) use of anticoagulants: Secondary | ICD-10-CM | POA: Diagnosis not present

## 2024-03-05 DIAGNOSIS — I050 Rheumatic mitral stenosis: Secondary | ICD-10-CM | POA: Diagnosis present

## 2024-03-05 LAB — ECHOCARDIOGRAM COMPLETE
Area-P 1/2: 3.08 cm2
Height: 72 in
MV VTI: 2.26 cm2
S' Lateral: 2.9 cm
Weight: 3552.05 [oz_av]

## 2024-03-05 LAB — D-DIMER, QUANTITATIVE: D-Dimer, Quant: 2.35 ug{FEU}/mL — ABNORMAL HIGH (ref 0.00–0.50)

## 2024-03-05 LAB — CBC
HCT: 36.9 % — ABNORMAL LOW (ref 39.0–52.0)
Hemoglobin: 11.3 g/dL — ABNORMAL LOW (ref 13.0–17.0)
MCH: 29.4 pg (ref 26.0–34.0)
MCHC: 30.6 g/dL (ref 30.0–36.0)
MCV: 95.8 fL (ref 80.0–100.0)
Platelets: 202 10*3/uL (ref 150–400)
RBC: 3.85 MIL/uL — ABNORMAL LOW (ref 4.22–5.81)
RDW: 15.4 % (ref 11.5–15.5)
WBC: 6.3 10*3/uL (ref 4.0–10.5)
nRBC: 0 % (ref 0.0–0.2)

## 2024-03-05 LAB — CBG MONITORING, ED
Glucose-Capillary: 117 mg/dL — ABNORMAL HIGH (ref 70–99)
Glucose-Capillary: 109 mg/dL — ABNORMAL HIGH (ref 70–99)

## 2024-03-05 LAB — APTT
aPTT: 84 s — ABNORMAL HIGH (ref 24–36)
aPTT: 77 s — ABNORMAL HIGH (ref 24–36)

## 2024-03-05 LAB — HEPATIC FUNCTION PANEL
ALT: 25 U/L (ref 0–44)
AST: 23 U/L (ref 15–41)
Albumin: 3.7 g/dL (ref 3.5–5.0)
Alkaline Phosphatase: 52 U/L (ref 38–126)
Bilirubin, Direct: 0.2 mg/dL (ref 0.0–0.2)
Indirect Bilirubin: 0.7 mg/dL (ref 0.3–0.9)
Total Bilirubin: 0.9 mg/dL (ref 0.0–1.2)
Total Protein: 6.9 g/dL (ref 6.5–8.1)

## 2024-03-05 LAB — HEPARIN LEVEL (UNFRACTIONATED): Heparin Unfractionated: >1.1 [IU]/mL — ABNORMAL HIGH (ref 0.30–0.70)

## 2024-03-05 MED ORDER — SODIUM CHLORIDE 0.9 % IV SOLN: 250.0000 mL | INTRAVENOUS | Status: AC | PRN

## 2024-03-05 MED ORDER — ACETAMINOPHEN 325 MG PO TABS
650.0000 mg | ORAL_TABLET | ORAL | Status: DC | PRN
  Administered 2024-03-06 (×2): 650 mg via ORAL
  Filled 2024-03-05 (×2): qty 2

## 2024-03-05 MED ORDER — HEPARIN (PORCINE) 25000 UT/250ML-% IV SOLN
1400.0000 [IU]/h | INTRAVENOUS | Status: DC
  Administered 2024-03-05 (×2): 1400 [IU]/h via INTRAVENOUS
  Filled 2024-03-05 (×2): qty 250

## 2024-03-05 MED ORDER — SODIUM CHLORIDE 0.9% FLUSH
3.0000 mL | Freq: Two times a day (BID) | INTRAVENOUS | Status: DC
  Administered 2024-03-05 – 2024-03-06 (×3): 3 mL via INTRAVENOUS

## 2024-03-05 MED ORDER — ATORVASTATIN CALCIUM 40 MG PO TABS
40.0000 mg | ORAL_TABLET | Freq: Every day | ORAL | Status: DC
  Administered 2024-03-05: 40 mg via ORAL
  Filled 2024-03-05: qty 1

## 2024-03-05 MED ORDER — FUROSEMIDE 10 MG/ML IJ SOLN
80.0000 mg | Freq: Once | INTRAMUSCULAR | Status: AC
  Administered 2024-03-05: 80 mg via INTRAVENOUS
  Filled 2024-03-05: qty 8

## 2024-03-05 MED ORDER — LEVOTHYROXINE SODIUM 75 MCG PO TABS
75.0000 ug | ORAL_TABLET | Freq: Every day | ORAL | Status: DC
  Administered 2024-03-05 – 2024-03-06 (×2): 75 ug via ORAL
  Filled 2024-03-05 (×2): qty 1

## 2024-03-05 MED ORDER — PROBIOTIC DAILY PO CAPS: ORAL_CAPSULE | Freq: Every day | ORAL | Status: DC

## 2024-03-05 MED ORDER — IOHEXOL 350 MG/ML SOLN
75.0000 mL | Freq: Once | INTRAVENOUS | Status: AC | PRN
  Administered 2024-03-05: 75 mL via INTRAVENOUS

## 2024-03-05 MED ORDER — AMIODARONE HCL 200 MG PO TABS
200.0000 mg | ORAL_TABLET | Freq: Every day | ORAL | Status: DC
  Administered 2024-03-05 – 2024-03-06 (×2): 200 mg via ORAL
  Filled 2024-03-05 (×2): qty 1

## 2024-03-05 MED ORDER — INSULIN ASPART 100 UNIT/ML IJ SOLN
0.0000 [IU] | Freq: Three times a day (TID) | INTRAMUSCULAR | Status: DC
  Administered 2024-03-06 (×2): 1 [IU] via SUBCUTANEOUS

## 2024-03-05 MED ORDER — FLUTICASONE PROPIONATE 50 MCG/ACT NA SUSP: 1.0000 | Freq: Every day | NASAL | Status: DC | PRN

## 2024-03-05 MED ORDER — SODIUM CHLORIDE 0.9% FLUSH: 3.0000 mL | INTRAVENOUS | Status: DC | PRN

## 2024-03-05 MED ORDER — ONDANSETRON HCL 4 MG/2ML IJ SOLN: 4.0000 mg | Freq: Four times a day (QID) | INTRAMUSCULAR | Status: DC | PRN

## 2024-03-05 MED ORDER — ASPIRIN 81 MG PO TBEC
81.0000 mg | DELAYED_RELEASE_TABLET | Freq: Every day | ORAL | Status: DC
  Administered 2024-03-05 – 2024-03-06 (×2): 81 mg via ORAL
  Filled 2024-03-05 (×2): qty 1

## 2024-03-05 MED ORDER — FAMOTIDINE 20 MG PO TABS: 20.0000 mg | ORAL_TABLET | Freq: Every day | ORAL | Status: DC | PRN

## 2024-03-05 NOTE — Progress Notes (Signed)
 PHARMACY - ANTICOAGULATION CONSULT NOTE  Pharmacy Consult for heparin Indication: atrial fibrillation  No Known Allergies  Patient Measurements: Height: 6' (182.9 cm) Weight: 100.7 kg (222 lb 0.1 oz) IBW/kg (Calculated) : 77.6 HEPARIN DW (KG): 98.1  Vital Signs: Temp: 97.8 F (36.6 C) (04/10 0822) Temp Source: Oral (04/10 0415) BP: 125/66 (04/10 0822) Pulse Rate: 60 (04/10 0822)  Labs: Recent Labs    03/04/24 1647 03/04/24 1830 03/05/24 1002 03/05/24 1014  HGB 11.0*  --  11.3*  --   HCT 35.3*  --  36.9*  --   PLT 184  --  202  --   APTT  --   --   --  84*  HEPARINUNFRC  --   --  >1.10*  --   CREATININE 1.60*  --   --   --   TROPONINIHS 22* 25*  --   --     Estimated Creatinine Clearance: 44.5 mL/min (A) (by C-G formula based on SCr of 1.6 mg/dL (H)).   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  Initial aPTT therapeutic: 84 seconds (heparin level not correlating). No issues with infusion or overt s/sx of bleeding reported.   Goal of Therapy:  Heparin level 0.3-0.7 units/ml aPTT 66-102 seconds Monitor platelets by anticoagulation protocol: Yes   Plan:  Continue heparin infusion at 1400 units/hr. Monitor heparin levels, aPTT (while DOAC affects anti-Xa assay), and CBC.  Ruben Im, PharmD Clinical  Pharmacist 03/05/2024 11:20 AM Please check AMION for all [facility] Pharmacy numbers

## 2024-03-05 NOTE — ED Notes (Signed)
 Pt alert, NAD, calm, interactive, resps e/u, speaking in clear complete sentences, VSS, NSR HR 60 on monitor. Denies pain, nausea, or dizziness, endorses some sob still.

## 2024-03-05 NOTE — H&P (Signed)
 Cardiology Admission History and Physical   Patient ID: Darin Mcclure MRN: [id number]; DOB: February 21, 1941   Admission date: 03/04/2024  PCP:  Eartha Inch, MD   [facility] Providers Cardiologist:  Donato Schultz, MD        Chief Complaint: DOE  Patient Profile:   Darin Mcclure is a 83 y.o. male with CAD s/p 4V CABG (01/21/24), PAF s/p MAZE and LAA Clip (01/21/24) on Eliquis, HTN, HLD, DM2, hypothyroidism and GERD who is being seen 03/05/2024 for the evaluation of DOE.  History of Present Illness:   Darin Mcclure recently underwent a 4V CABG on 2/25 for obstructive coronary disease.  His hospital course was largely uncomplicated.  Upon returning home the patient noted that he felt more short of breath with exertion.  Initially he describes being able to walk roughly 100 feet before feeling winded (at baseline he has no DOE) which is unusual for him.  He was evaluated and found to have a left sided pleural effusion which was drained with thoracentesis.  The patient went back into atrial fibrillation after he was discharged from the hospital so underwent elective DCCV on /8/25.  Shortly after the DCCV the patient notices that his DOE has gotten progressively worse to where now he can only walk a few feet before becoming winded.  He called his surgeon to notify him of the symptoms and he was recommended to get a CXR.  He then notified his primary cardiologist and was prescribed Lasix 40 mg twice daily.  Despite taking this and even doubling his dose he saw little improvement and was instructed to come to the ED for further evaluation.  In the ED his VS were afebrile, BP 129/60, HR 64, RR 17 and satting 100% on RA.  Labs notable for creatinine 1.6, hemoglobin 11, troponins 22 -> 25, BNP 179.7 and D-dimer 2.35.  CXR revealed a moderate left pleural effusion.  CT PE is still pending final read.  ECG showed RBBB.  The patient is being admitted for DOE.   Past Medical History:   Diagnosis Date   Allergy    Blood transfusion without reported diagnosis    Coronary artery disease    cath/stent 2007   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) 08/26/2017   GERD (gastroesophageal reflux disease)    Heart disease    Hyperglycemia    Hypertension    Mixed hyperlipidemia    Muscle cramp 08/26/2017   Neuropathy    Osteoarthritis    RBBB    RBBB (right bundle branch block)    Type 2 diabetes mellitus (HCC)     Past Surgical History:  Procedure Laterality Date   ACHILLES TENDON REPAIR Right    BACK SURGERY     CARDIOVERSION N/A 03/03/2024   Procedure: CARDIOVERSION;  Surgeon: Thomasene Ripple, DO;  Location: [facility];  Service: Cardiovascular;  Laterality: N/A;   CLIPPING OF ATRIAL APPENDAGE N/A 01/21/2024   Procedure: CLIPPING OF ATRIAL APPENDAGE USING ATRICURE ATRICLIP PRO2;  Surgeon: Corliss Skains, MD;  Location: [facility] OR;  Service: Open Heart Surgery;  Laterality: N/A;   CORONARY ANGIOPLASTY     pror stent to mid RCA 10/04/2006   CORONARY ARTERY BYPASS GRAFT N/A 01/21/2024   Procedure: CORONARY ARTERY BYPASS GRAFTING (CABG) TIMES FOUR USING LEFT INTERNAL MAMMARY ARTERY AND BILATERAL ENDOSCOPICALLY HARVESTED GREATER SAPHENOUS VEINS;  Surgeon: Corliss Skains, MD;  Location: [facility] OR;  Service: Open Heart Surgery;  Laterality: N/A;   FOOT SURGERY     Cyst removal   INGUINAL HERNIA REPAIR     IR THORACENTESIS ASP PLEURAL SPACE W/IMG GUIDE  02/25/2024   LEFT HEART CATH AND CORONARY ANGIOGRAPHY N/A 01/15/2024   Procedure: LEFT HEART CATH AND CORONARY ANGIOGRAPHY;  Surgeon: Kathleene Hazel, MD;  Location: [facility];  Service: Cardiovascular;  Laterality: N/A;   MAZE N/A 01/21/2024   Procedure: MAZE;  Surgeon: Corliss Skains, MD;  Location: [facility] OR;  Service: Open Heart Surgery;  Laterality: N/A;   PERCUTANEOUS CORONARY STENT INTERVENTION (PCI-S)  09/2006   REPLACEMENT TOTAL KNEE Left     TEE WITHOUT CARDIOVERSION N/A 01/21/2024   Procedure: TRANSESOPHAGEAL ECHOCARDIOGRAM (TEE);  Surgeon: Corliss Skains, MD;  Location: [facility] OR;  Service: Open Heart Surgery;  Laterality: N/A;   TOTAL HIP ARTHROPLASTY Right    TOTAL HIP ARTHROPLASTY Left      Medications Prior to Admission: Prior to Admission medications   Medication Sig Start Date End Date Taking? Authorizing Provider  acetaminophen (TYLENOL) 500 MG tablet Take 1-2 tablets (500-1,000 mg total) by mouth every 6 (six) hours as needed. 01/25/24  Yes Barrett, Erin R, PA-C  amiodarone (PACERONE) 200 MG tablet Take 1 tablet (200 mg total) by mouth daily. 01/25/24  Yes Barrett, Erin R, PA-C  amoxicillin (AMOXIL) 500 MG capsule Take 2,000 mg by mouth See admin instructions. As needed for dental procedure 03/27/19  Yes [provider]  apixaban (ELIQUIS) 5 MG TABS tablet Take 1 tablet (5 mg total) by mouth 2 (two) times daily. 02/26/24  Yes Jake Bathe, MD  aspirin EC 81 MG tablet Take 81 mg by mouth daily.   Yes [provider]  atorvastatin (LIPITOR) 40 MG tablet Take 1 tablet (40 mg total) by mouth at bedtime. 01/25/24  Yes Barrett, Erin R, PA-C  famotidine (PEPCID) 20 MG tablet Take 1 tablet (20 mg total) by mouth daily. Patient taking differently: Take 20 mg by mouth daily as needed for heartburn or indigestion. 02/05/22  Yes Arnaldo Natal, NP  fluticasone (FLONASE) 50 MCG/ACT nasal spray Place 1 spray into both nostrils daily as needed for allergies. 08/08/17  Yes [provider]  Inulin (FIBER CHOICE PO) Take 2 tablets by mouth daily.   Yes [provider]  JANUVIA 100 MG tablet Take 100 mg by mouth daily. 08/19/21  Yes [provider]  levothyroxine (SYNTHROID) 75 MCG tablet Take 75 mcg by mouth daily before breakfast. 04/12/20  Yes [provider]  Magnesium 500 MG CAPS Take 1,000 mg by mouth daily.   Yes [provider]  metFORMIN (GLUCOPHAGE) 1000 MG tablet  Take 1 tablet by mouth 2 (two) times daily. 04/30/22  Yes [provider]  metoprolol succinate (TOPROL XL) 50 MG 24 hr tablet Take 1 tablet (50 mg total) by mouth in the morning and at bedtime. 02/10/24  Yes Swinyer, Zachary George, NP  NONFORMULARY OR COMPOUNDED ITEM Topical antifungal topical solution: terbinafine 3%, fluconazole 2%, tea tree oil 5%, Urea 10%, ibuprofen2% in DMSO suspension. Sig: Apply to the affected toenail(s) once daily.  Order sent to [facility] [address] [place], [place] [address] 01/16/24  Yes [name], DPM  Omega-3 Fatty Acids (OMEGA-3 FISH OIL PO) Take 4,000 mg by mouth at bedtime.   Yes [provider]  ONE TOUCH ULTRA TEST test strip USE 1 TO 2 TIMES A DAY 08/29/18  Yes [provider]  Probiotic Product (  PROBIOTIC DAILY PO) Take 1 capsule by mouth daily.   Yes [provider]  methylPREDNISolone (MEDROL DOSEPAK) 4 MG TBPK tablet Take as directed until finished beginning the day of the left thoracentesis. Patient not taking: Reported on 03/05/2024 02/24/24   Leary Roca, PA-C     Allergies:   No Known Allergies  Social History:   Social History   Socioeconomic History   Marital status: Single    Spouse name: Not on file   Number of children: 2   Years of education: Not on file   Highest education level: Not on file  Occupational History   Occupation: retired  Tobacco Use   Smoking status: Never   Smokeless tobacco: Never  Vaping Use   Vaping status: Never Used  Substance and Sexual Activity   Alcohol use: Yes    Comment: Rarely   Drug use: No   Sexual activity: Not on file  Other Topics Concern   Not on file  Social History Narrative   Retired as a Production designer, 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       Two Story Home    Right handed    Social Drivers of Health   Financial Resource Strain: Low  Risk  (12/30/2023)   Received from [facility]   Overall Financial Resource Strain (CARDIA)    Difficulty of Paying Living Expenses: Not hard at all  Food Insecurity: No Food Insecurity (01/15/2024)   Hunger Vital Sign    Worried About Running Out of Food in the Last Year: Never true    Ran Out of Food in the Last Year: Never true  Transportation Needs: No Transportation Needs (01/15/2024)   PRAPARE - Administrator, Civil Service (Medical): No    Lack of Transportation (Non-Medical): No  Physical Activity: Insufficiently Active (07/31/2023)   Received from [facility]   Exercise Vital Sign    Days of Exercise per Week: 4 days    Minutes of Exercise per Session: 20 min  Stress: No Stress Concern Present (07/31/2023)   Received from [facility] - Occupational Stress Questionnaire    Feeling of Stress : Not at all  Social Connections: Socially Integrated (01/15/2024)   Social Connection and Isolation Panel [NHANES]    Frequency of Communication with Friends and Family: More than three times a week    Frequency of Social Gatherings with Friends and Family: More than three times a week    Attends Religious Services: 1 to 4 times per year    Active Member of [employer] or Organizations: Yes    Attends Banker Meetings: More than 4 times per year    Marital Status: Living with partner  Intimate Partner Violence: Not At Risk (01/15/2024)   Humiliation, Afraid, Rape, and Kick questionnaire    Fear of Current or Ex-Partner: No    Emotionally Abused: No    Physically Abused: No    Sexually Abused: No    Family History:   The patient's family history includes CAD in his father; Lung cancer in his father. There is no history of Colon cancer, Esophageal cancer, Rectal cancer, or Stomach cancer.    ROS:  Please see the history of present illness.  All other ROS reviewed and negative.     Physical Exam/Data:   Vitals:   03/04/24  1949 03/04/24 2345 03/05/24 0045 03/05/24 0415  BP: 129/60 120/68 139/73   Pulse: 64 (!) 59 61   Resp: 18 19 15    Temp: 97.9 F (36.6 C) 97.9 F (36.6 C)  97.8 F (36.6 C)  TempSrc: Oral Oral  Oral  SpO2: 96% 100% 100%   Weight:      Height:       No intake or output data in the 24 hours ending 03/05/24 0456    03/04/2024    4:08 PM 03/03/2024    8:41 AM 02/27/2024    1:04 PM  Last 3 Weights  Weight (lbs) 222 lb 0.1 oz 222 lb 228 lb  Weight (kg) 100.7 kg 100.699 kg 103.42 kg     Body mass index is 30.11 kg/m.  General:  Well nourished, well developed, in no acute distress, pleasant HEENT: normal, atraumatic, normocephalic Neck: JVD elevated to tragus at 45 degrees Vascular: No carotid bruits; radial pulses 2+ bilaterally   Cardiac:  normal S1, S2; RRR; no murmur or gallops, friction rub present? Lungs: Reduced breath sounds in LLL normal work of breathing, minimal rales in the right base, no wheezes Abd: soft, nontender, nondistended Ext: 2+ pitting edema to the knees bilaterally Musculoskeletal: Grossly normal Skin: warm and dry  Neuro: Grossly normal Psych:  Normal affect    EKG:  The ECG that was done  was personally reviewed and demonstrates an RBBB    Relevant CV Studies:  TEE 01/21/24:  Limited post CPB exam: The patient separated easily from CPB.    _ Left Ventricle: The left ventricular function is normal, unchanged from  pre-bypass images. The overall EF is 60-65%.  _ Right Ventricle: The right ventricular function appears low normal,  essentially unchanged from pre-bypass images.  _ Aortic Valve: The aortic valve appears normal, unchanged from pre-bypass  images. There is no insufficiency by color Doppler.  _ Mitral Valve: The mitral valve function appears unchanged from  pre-bypass  images. 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  _ Tricuspid Valve: The tricuspid valve function appears essentially  unchanged  from pre-bypass. There  was severe Tricuspid regurgitation on initial  separation  from CPB, improved to moderate with time off of CPB   TTE 01/15/24:  IMPRESSIONS     1. Left ventricular ejection fraction, by estimation, is 65 to 70%. The  left ventricle has normal function. The left ventricle has no regional  wall motion abnormalities. There is moderate left ventricular hypertrophy.  Left ventricular diastolic  parameters are indeterminate.   2. Right ventricular systolic function is normal. The right ventricular  size is normal. There is normal pulmonary artery systolic pressure. The  estimated right ventricular systolic pressure is 24.9 mmHg.   3. Left atrial size was moderately dilated.   4. The mitral valve is degenerative. Trivial mitral valve regurgitation.  Mild to moderate mitral stenosis. The mean mitral valve gradient is 5.0  mmHg. Severe mitral annular calcification.   5. The aortic valve is normal in structure. Aortic valve regurgitation is  not visualized. No aortic stenosis is present.   6. The inferior vena cava is normal in size with greater than 50%  respiratory variability, suggesting right atrial pressure of 3 mmHg.   Laboratory Data:  High Sensitivity Troponin:   Recent Labs  Lab 03/04/24 1647 03/04/24 1830  TROPONINIHS 22* 25*      Chemistry Recent Labs  Lab 02/27/24 1551 03/04/24 1647  NA 140 138  K 4.8 4.9  CL 99 103  CO2 24 24  GLUCOSE 105* 124*  BUN 22 18  CREATININE 1.35* 1.60*  CALCIUM 9.3 8.9  GFRNONAA  --  43*  ANIONGAP  --  11    Recent Labs  Lab 03/05/24 0030  PROT 6.9  ALBUMIN 3.7  AST 23  ALT 25  ALKPHOS 52  BILITOT 0.9   Lipids No results for input(s): "CHOL", "TRIG", "HDL", "LABVLDL", "LDLCALC", "CHOLHDL" in the last 168 hours. Hematology Recent Labs  Lab 03/04/24 1647  WBC 6.6  RBC 3.69*  HGB 11.0*  HCT 35.3*  MCV 95.7  MCH 29.8  MCHC 31.2  RDW 15.1  PLT 184   Thyroid No results for input(s): "TSH", "FREET4" in the last 168  hours. BNP Recent Labs  Lab 03/04/24 1647  BNP 179.7*    DDimer  Recent Labs  Lab 03/05/24 0030  DDIMER 2.35*     Radiology/Studies:  DG Chest Portable 1 View Result Date: 03/05/2024 CLINICAL DATA:  Initial evaluation for acute dyspnea. EXAM: PORTABLE CHEST 1 VIEW COMPARISON:  Prior radiograph from 03/04/2024. FINDINGS: Median sternotomy wires underlying CABG markers and surgical clips. Left atrial appendage clip noted. Cardiomegaly, stable. Mediastinal silhouette within normal limits. Aortic atherosclerosis. Lungs normally inflated. Layering left pleural effusion with associated dense left basilar opacity, which could reflect atelectasis or infiltrate. Lungs are otherwise clear. No pulmonary edema. No pneumothorax. No acute osseous finding. IMPRESSION: Moderate left pleural effusion with associated dense left basilar opacity, which could reflect atelectasis or infiltrate, relatively stable from prior. Electronically Signed   By: Rise Mu M.D.   On: 03/05/2024 00:51   DG Chest 2 View Result Date: 03/04/2024 CLINICAL DATA:  Shortness of breath.  Pleural effusion EXAM: CHEST - 2 VIEW COMPARISON:  X-ray 02/27/2024 and older FINDINGS: Postop chest. Atrial occlusion clip. Stable cardiopericardial silhouette calcified aorta. Increasing now moderate left effusion with adjacent lung opacity. No pneumothorax or edema. Dense left upper lobe lung nodule, likely calcified. Right lung is grossly clear. Degenerative changes of the spine. IMPRESSION: Postop chest.  Moderate left effusion. Electronically Signed   By: Karen Kays M.D.   On: 03/04/2024 12:16     Assessment and Plan:   Darin Mcclure is a 83 y.o. male with CAD s/p 4V CABG (01/21/24), PAF s/p MAZE and LAA Clip (01/21/24) on Eliquis, HTN, HLD, DM2, hypothyroidism and GERD who is being seen 03/05/2024 for the evaluation of DOE.  #DOE #L Pleural Effusion #Volume Overload -- Patient presenting with DOE and this volume up by exam  but elevated BNP.  Lung imaging demonstrates reaccumulation of a left pleural effusion. -- I suspect his DOE is multifactorial from his pleural effusion as well as HFpEF. -- Will plan to diurese to remove excess fluid and will consider left thoracentesis once apixaban was washed out --Complete echo -- Strict I's and O's Daily weights -- Lasix to achieve net negative goals -- Follow-up CT PE -- Hold apixaban pending possible thoracentesis and start heparin gtt.  #PAF/Aflutter s/p DCCV 03/03/24 :: Underwent DCCV on 03/03/2024 for atrial flutter.  Given that he is recently cardioverted he warrants uninterrupted anticoagulation for least 1 month due to atrial stunning.  The patient currently takes apixaban, but he will be unable to get a thoracentesis while on a potent blood thinner.  Will hold apixaban and bridged with heparin. - Hold apixaban as above - Start heparin as above - Continue amiodarone 200 mg daily  #CAD s/p 4V CABG #HLD - Continue aspirin daily -  Continue atorvastatin 40 mg daily  #DMII - Hold home oral DM2 medicines - SSI  #Hypothyroidism - Continue Synthroid   Risk Assessment/Risk Scores:       New York Heart Association (NYHA) Functional Class NYHA Class II  CHA2DS2-VASc Score = 5   This indicates a 7.2% annual risk of stroke. The patient's score is based upon: CHF History: 0 HTN History: 1 Diabetes History: 1 Stroke History: 0 Vascular Disease History: 1 Age Score: 2 Gender Score: 0     Code Status: Full Code  Severity of Illness: The appropriate patient status for this patient is INPATIENT. Inpatient status is judged to be reasonable and necessary in order to provide the required intensity of service to ensure the patient's safety. The patient's presenting symptoms, physical exam findings, and initial radiographic and laboratory data in the context of their chronic comorbidities is felt to place them at high risk for further clinical deterioration.  Furthermore, it is not anticipated that the patient will be medically stable for discharge from the hospital within 2 midnights of admission.   * I certify that at the point of admission it is my clinical judgment that the patient will require inpatient hospital care spanning beyond 2 midnights from the point of admission due to high intensity of service, high risk for further deterioration and high frequency of surveillance required.*   For questions or updates, please contact [facility] Please consult [email or web address] for contact info under     Signed, Karl Ito, MD  03/05/2024 4:56 AM

## 2024-03-05 NOTE — Progress Notes (Signed)
 PHARMACY - ANTICOAGULATION CONSULT NOTE  Pharmacy Consult for heparin Indication: atrial fibrillation  No Known Allergies  Patient Measurements: Height: 6' (182.9 cm) Weight: 100.7 kg (222 lb 0.1 oz) IBW/kg (Calculated) : 77.6 HEPARIN DW (KG): 98.1  Vital Signs: Temp: 97.9 F (36.6 C) (04/09 2345) Temp Source: Oral (04/09 2345) BP: 139/73 (04/10 0045) Pulse Rate: 61 (04/10 0045)  Labs: Recent Labs    03/04/24 1647 03/04/24 1830  HGB 11.0*  --   HCT 35.3*  --   PLT 184  --   CREATININE 1.60*  --   TROPONINIHS 22* 25*    Estimated Creatinine Clearance: 44.5 mL/min (A) (by C-G formula based on SCr of 1.6 mg/dL (H)).   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  Goal of Therapy:  Heparin level 0.3-0.7 units/ml aPTT 66-102 seconds Monitor platelets by anticoagulation protocol: Yes   Plan:  Start heparin infusion at 1400 units/hr. Monitor heparin levels, aPTT (while DOAC affects anti-Xa assay), and CBC.  Vernard Gambles, PharmD, BCPS  03/05/2024,2:16 AM

## 2024-03-05 NOTE — Progress Notes (Signed)
 DAILY PROGRESS NOTE   Patient Name: Darin Mcclure Date of Encounter: 03/05/2024 Cardiologist: Donato Schultz, MD  Chief Complaint   Short of breath  Patient Profile   Darin Mcclure is a 83 y.o. male with CAD s/p 4V CABG (01/21/24), PAF s/p MAZE and LAA Clip (01/21/24) on Eliquis, HTN, HLD, DM2, hypothyroidism and GERD who is being seen 03/05/2024 for the evaluation of DOE.   Subjective   Darin Mcclure presented again with dyspnea. He had recenet cardioversion 2 days ago - he said he felt immediately worse afterwards, despite successful conversion to sinus rhythm. He feels it was related to cardioversion. CT pulmonary angiogram was negative for PE, however he has a recurrent moderate to large left pleural effusion. This has been drained twice. He also has a persistent right endovascular harvest site seroma, related to CABG in 12/2023. Prior to admission his CT showed left basilar atelectasis, but no effusion on 4/3, however, repeat CXR yesterday shows moderate left effusion.  Since he felt that it developed after cardioversion, I suspect the effusion was accumulating and reached significance around the same time. The other possibility is that he may have some systolic heart failure and was dependent on the faster atrial rate for cardiac output, which was removed with cardioversion.  He is currently in sinus rhythm/bradycardia.  Troponin flat at 22, 25 (do no suspect bypass graft dysfunction), BNP 179.  He was on up to 40 mg BID lasix, but not urinating and creatinine worse at 1.6.  Given lasix today with "good response" according to him. He is on heparin, with Eliquis being held for possible surgery. His interoperative TEE showed LVEF 60-65%. Plan repeat echo today.  Objective   Vitals:   03/05/24 0045 03/05/24 0415 03/05/24 0530 03/05/24 0822  BP: 139/73  135/87 125/66  Pulse: 61  (!) 126 60  Resp: 15  18 16   Temp:  97.8 F (36.6 C)  97.8 F (36.6 C)  TempSrc:  Oral    SpO2: 100%   100% 100%  Weight:      Height:        Intake/Output Summary (Last 24 hours) at 03/05/2024 0850 Last data filed at 03/05/2024 0704 Gross per 24 hour  Intake --  Output 1950 ml  Net -1950 ml   Filed Weights   03/04/24 1608  Weight: 100.7 kg    Physical Exam   General appearance: alert and no distress Neck: JVD - 4 cm above sternal notch, no carotid bruit, and thyroid not enlarged, symmetric, no tenderness/mass/nodules Lungs: diminished breath sounds LLL and dullness to percussion LLL Heart: regular rate and rhythm Abdomen: soft, non-tender; bowel sounds normal; no masses,  no organomegaly Extremities: edema trace bilateral pedal Pulses: 2+ and symmetric Skin: Skin color, texture, turgor normal. No rashes or lesions Neurologic: Grossly normal Psych: pleasant, but frustrated  Inpatient Medications    Scheduled Meds:  amiodarone  200 mg Oral Daily   aspirin EC  81 mg Oral Daily   atorvastatin  40 mg Oral QHS   insulin aspart  0-9 Units Subcutaneous TID WC   levothyroxine  75 mcg Oral QAC breakfast   sodium chloride flush  3 mL Intravenous Q12H    Continuous Infusions:  sodium chloride     heparin 1,400 Units/hr (03/05/24 0823)    PRN Meds: sodium chloride, acetaminophen, famotidine, fluticasone, ondansetron (ZOFRAN) IV, sodium chloride flush   Labs   Results for orders placed or performed during the hospital encounter of 03/04/24 (  from the past 48 hours)  Basic metabolic panel     Status: Abnormal   Collection Time: 03/04/24  4:47 PM  Result Value Ref Range   Sodium 138 135 - 145 mmol/L   Potassium 4.9 3.5 - 5.1 mmol/L   Chloride 103 98 - 111 mmol/L   CO2 24 22 - 32 mmol/L   Glucose, Bld 124 (H) 70 - 99 mg/dL    Comment: Glucose reference range applies only to samples taken after fasting for at least 8 hours.   BUN 18 8 - 23 mg/dL   Creatinine, Ser 9.52 (H) 0.61 - 1.24 mg/dL   Calcium 8.9 8.9 - 84.1 mg/dL   GFR, Estimated 43 (L) >60 mL/min    Comment:  (NOTE) Calculated using the CKD-EPI Creatinine Equation (2021)    Anion gap 11 5 - 15    Comment: Performed at [facility], [address], [place], [place] [address]  CBC     Status: Abnormal   Collection Time: 03/04/24  4:47 PM  Result Value Ref Range   WBC 6.6 4.0 - 10.5 K/uL   RBC 3.69 (L) 4.22 - 5.81 MIL/uL   Hemoglobin 11.0 (L) 13.0 - 17.0 g/dL   HCT 10.2 (L) 72.5 - 36.6 %   MCV 95.7 80.0 - 100.0 fL   MCH 29.8 26.0 - 34.0 pg   MCHC 31.2 30.0 - 36.0 g/dL   RDW 44.0 34.7 - 42.5 %   Platelets 184 150 - 400 K/uL   nRBC 0.0 0.0 - 0.2 %    Comment: Performed at [facility], [address], [place], [place] [address]  Troponin I (High Sensitivity)     Status: Abnormal   Collection Time: 03/04/24  4:47 PM  Result Value Ref Range   Troponin I (High Sensitivity) 22 (H) <18 ng/L    Comment: (NOTE) Elevated high sensitivity troponin I (hsTnI) values and significant  changes across serial measurements may suggest ACS but many other  chronic and acute conditions are known to elevate hsTnI results.  Refer to the "Links" section for chest pain algorithms and additional  guidance. Performed at [facility], [address] [place], [place] [address]   Brain natriuretic peptide     Status: Abnormal   Collection Time: 03/04/24  4:47 PM  Result Value Ref Range   B Natriuretic Peptide 179.7 (H) 0.0 - 100.0 pg/mL    Comment: Performed at [facility], [address] [place], [place] [address]  Troponin I (High Sensitivity)     Status: Abnormal   Collection Time: 03/04/24  6:30 PM  Result Value Ref Range   Troponin I (High Sensitivity) 25 (H) <18 ng/L    Comment: (NOTE) Elevated high sensitivity troponin I (hsTnI) values and significant  changes across serial measurements may suggest ACS but many other  chronic and acute conditions are known to elevate hsTnI results.  Refer to the "Links" section for chest pain algorithms and additional  guidance. Performed at  [facility], [address] [place], [place] [address]   Hepatic function panel     Status: None   Collection Time: 03/05/24 12:30 AM  Result Value Ref Range   Total Protein 6.9 6.5 - 8.1 g/dL   Albumin 3.7 3.5 - 5.0 g/dL   AST 23 15 - 41 U/L   ALT 25 0 - 44 U/L   Alkaline Phosphatase 52 38 - 126 U/L   Total Bilirubin 0.9  0.0 - 1.2 mg/dL   Bilirubin, Direct 0.2 0.0 - 0.2 mg/dL   Indirect Bilirubin 0.7 0.3 - 0.9 mg/dL    Comment: Performed at [facility], [address] [place], [place] [address]  D-dimer, quantitative     Status: Abnormal   Collection Time: 03/05/24 12:30 AM  Result Value Ref Range   D-Dimer, Quant 2.35 (H) 0.00 - 0.50 ug/mL-FEU    Comment: (NOTE) At the manufacturer cut-off value of 0.5 g/mL FEU, this assay has a negative predictive value of 95-100%.This assay is intended for use in conjunction with a clinical pretest probability (PTP) assessment model to exclude pulmonary embolism (PE) and deep venous thrombosis (DVT) in outpatients suspected of PE or DVT. Results should be correlated with clinical presentation. Performed at [facility], [address] [place], [place] [address]   CBG monitoring, ED     Status: Abnormal   Collection Time: 03/05/24  7:47 AM  Result Value Ref Range   Glucose-Capillary 117 (H) 70 - 99 mg/dL    Comment: Glucose reference range applies only to samples taken after fasting for at least 8 hours.    ECG   N/A  Telemetry   Sinus rhythm/bradycardia - Personally Reviewed  Radiology    CT Angio Chest PE W and/or Wo Contrast Result Date: 03/05/2024 CLINICAL DATA:  Evaluate for pulmonary embolism.  Positive D-dimer. EXAM: CT ANGIOGRAPHY CHEST WITH CONTRAST TECHNIQUE: Multidetector CT imaging of the chest was performed using the standard protocol during bolus administration of intravenous contrast. Multiplanar CT image reconstructions and MIPs were obtained to evaluate the vascular anatomy. RADIATION DOSE  REDUCTION: This exam was performed according to the departmental dose-optimization program which includes automated exposure control, adjustment of the mA and/or kV according to patient size and/or use of iterative reconstruction technique. CONTRAST:  75mL OMNIPAQUE IOHEXOL 350 MG/ML SOLN COMPARISON:  Chest radiograph from 03/04/2024 FINDINGS: Cardiovascular: Satisfactory opacification of the pulmonary arteries to the segmental level. No evidence of pulmonary embolism. Heart size is upper limits of normal. Postoperative changes from CABG procedure. Aortic atherosclerosis. No significant pericardial effusion. Calcifications on the mitral valve. Mediastinum/Nodes: Thyroid gland, trachea, and esophagus appear normal. No supraclavicular, axillary, mediastinal or hilar adenopathy. Lungs/Pleura: Moderate left pleural effusion with compressive type atelectasis of the left lower lobe. There is subsegmental atelectasis within the lingula. No pneumothorax identified. Pulmonary nodule calcified granuloma within the posteromedial right upper lobe. Right middle lobe nodule measures 4 mm, image 96/6. Upper Abdomen: No acute abnormality. Musculoskeletal: Postoperative changes from recent median sternotomy on 01/21/2024. Sternotomy defect remains distinct. No acute or suspicious osseous findings. Review of the MIP images confirms the above findings. IMPRESSION: 1. No evidence for acute pulmonary embolism. 2. Moderate left pleural effusion with compressive type atelectasis of the left lower lobe. 3. Postoperative changes from recent median sternotomy and CABG procedure. Sternotomy defect remains distinct. 4. 4 mm right middle lobe pulmonary nodule. If the patient is at high risk for bronchogenic carcinoma, follow-up chest CT at 1year is recommended. If the patient is at low risk, no follow-up is needed. This recommendation follows the consensus statement: Guidelines for Management of Small Pulmonary Nodules Detected on CT Scans: A  Statement from the Fleischner Society as published in Radiology 2005; 237:395-400. 5.  Aortic Atherosclerosis (ICD10-I70.0). Electronically Signed   By: Signa Kell M.D.   On: 03/05/2024 05:34   DG Chest Portable 1 View Result Date: 03/05/2024 CLINICAL DATA:  Initial evaluation for acute dyspnea. EXAM: PORTABLE CHEST 1  VIEW COMPARISON:  Prior radiograph from 03/04/2024. FINDINGS: Median sternotomy wires underlying CABG markers and surgical clips. Left atrial appendage clip noted. Cardiomegaly, stable. Mediastinal silhouette within normal limits. Aortic atherosclerosis. Lungs normally inflated. Layering left pleural effusion with associated dense left basilar opacity, which could reflect atelectasis or infiltrate. Lungs are otherwise clear. No pulmonary edema. No pneumothorax. No acute osseous finding. IMPRESSION: Moderate left pleural effusion with associated dense left basilar opacity, which could reflect atelectasis or infiltrate, relatively stable from prior. Electronically Signed   By: Rise Mu M.D.   On: 03/05/2024 00:51   DG Chest 2 View Result Date: 03/04/2024 CLINICAL DATA:  Shortness of breath.  Pleural effusion EXAM: CHEST - 2 VIEW COMPARISON:  X-ray 02/27/2024 and older FINDINGS: Postop chest. Atrial occlusion clip. Stable cardiopericardial silhouette calcified aorta. Increasing now moderate left effusion with adjacent lung opacity. No pneumothorax or edema. Dense left upper lobe lung nodule, likely calcified. Right lung is grossly clear. Degenerative changes of the spine. IMPRESSION: Postop chest.  Moderate left effusion. Electronically Signed   By: Karen Kays M.D.   On: 03/04/2024 12:16   EP STUDY Result Date: 03/03/2024 See surgical note for result.   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 This may suggest that he developed a cardiomyopathy. Plan repeat echo today- given IV lasix today- monitor output and creatinine. BNP mildly elevated. I discussed the case with Jillyn Hidden, PA-C with CT surgery and the will consult- plan may be tentatively for PleurX drain and seroma revision, however, the patient seemed hesitant about having another surgery.   Time Spent Directly with Patient:  I have spent a total of 35 minutes with the patient reviewing hospital notes, telemetry, EKGs, labs and examining the patient as well as establishing an assessment and plan that was discussed personally with the patient.  > 50% of time was spent in direct patient care.  Length of Stay:  LOS: 0 days   Chrystie Nose, MD, [facility], FACP    [facility]  Medical Director of the [facility] Lip[facility]  Cardiovascular Risk Reduction Clinic Diplomate of the American Board of Clinical Lipidology Attending Cardiologist  Direct Dial: [phone]  Fax: [phone]  Website:  [email or web address] Nicely Nicolaus Andel 03/05/2024, 8:50 AM

## 2024-03-05 NOTE — Consult Note (Signed)
 Reason for Consult:pleural effusion Referring Physician: [name] is an 83 y.o. male.  HPI: Patient is a 83 year old male well-known to [facility] having undergone CABG x 4 with left atrial appendage clip placement and left atrial maze procedure by Dr. Cliffton Mcclure on 01/21/2024.  He has developed recurrent left pleural effusions and has undergone thoracentesis on 2 separate occasions.  Additionally he has a seroma at his [facility] harvest site.  Most recently he was seen in the office on 02/27/2024 and plans were for him to follow-up with Dr. Cliffton Mcclure tomorrow for possible placement of a Pleurx catheter and also possible I&D of the seroma.  Recently he has undergone cardioversion and noted increasing shortness of breath.  He has been successfully put in sinus rhythm.  He presented to the ER yesterday chest x-ray revealing moderate-sized left pleural effusion.  A CT scan was obtained to rule out PE and this was negative.  The scan did confirm a moderate left pleural effusion with compressive type atelectasis to the left lower lobe.  He was also found to have a 4 mm right middle lobe pulmonary nodule.  He is being admitted by cardiology for further evaluation and treatment and possibly doing the surgery while here if he is aggreable..  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) 08/26/2017   GERD (gastroesophageal reflux disease)    Heart disease    Hyperglycemia    Hypertension    Mixed hyperlipidemia    Muscle cramp 08/26/2017   Neuropathy    Osteoarthritis    RBBB    RBBB (right bundle branch block)    Type 2 diabetes mellitus (HCC)     Past Surgical History:  Procedure Laterality Date   ACHILLES TENDON REPAIR Right    BACK SURGERY     CARDIOVERSION N/A 03/03/2024   Procedure: CARDIOVERSION;   Surgeon: Darin Ripple, DO;  Location: [facility];  Service: Cardiovascular;  Laterality: N/A;   CLIPPING OF ATRIAL APPENDAGE N/A 01/21/2024   Procedure: CLIPPING OF ATRIAL APPENDAGE USING ATRICURE ATRICLIP PRO2;  Surgeon: Darin Skains, MD;  Location: [facility] OR;  Service: Open Heart Surgery;  Laterality: N/A;   CORONARY ANGIOPLASTY     pror stent to mid RCA 10/04/2006   CORONARY ARTERY BYPASS GRAFT N/A 01/21/2024   Procedure: CORONARY ARTERY BYPASS GRAFTING (CABG) TIMES FOUR USING LEFT INTERNAL MAMMARY ARTERY AND BILATERAL ENDOSCOPICALLY HARVESTED GREATER SAPHENOUS VEINS;  Surgeon: Darin Skains, MD;  Location: [facility] OR;  Service: Open Heart Surgery;  Laterality: N/A;   FOOT SURGERY     Cyst removal   INGUINAL HERNIA REPAIR     IR THORACENTESIS ASP PLEURAL SPACE W/IMG GUIDE  02/25/2024   LEFT HEART CATH AND CORONARY ANGIOGRAPHY N/A 01/15/2024   Procedure: LEFT HEART CATH AND CORONARY ANGIOGRAPHY;  Surgeon: Darin Hazel, MD;  [facility];  Service: Cardiovascular;  Laterality: N/A;   MAZE N/A 01/21/2024   Procedure: MAZE;  Surgeon: Darin Skains, MD;  Location: [facility] OR;  Service: Open Heart Surgery;  Laterality: N/A;   PERCUTANEOUS CORONARY STENT INTERVENTION (PCI-S)  09/2006   REPLACEMENT TOTAL KNEE Left    TEE WITHOUT CARDIOVERSION N/A 01/21/2024   Procedure: TRANSESOPHAGEAL ECHOCARDIOGRAM (TEE);  Surgeon: Darin Skains, MD;  Location: [facility] OR;  Service:  Open Heart Surgery;  Laterality: N/A;   TOTAL HIP ARTHROPLASTY Right    TOTAL HIP ARTHROPLASTY Left     Family History  Problem Relation Age of Onset   CAD Father    Lung cancer Father    Colon cancer Neg Hx    Esophageal cancer Neg Hx    Rectal cancer Neg Hx    Stomach cancer Neg Hx     Social History:  reports that he has never smoked. He has never used smokeless tobacco. He reports current alcohol use. He reports that he does not use drugs.  Allergies: No Known  Allergies Medications:  Current Facility-Administered Medications:    0.9 %  sodium chloride infusion, 250 mL, Intravenous, PRN, Darin Ito, MD   acetaminophen (TYLENOL) tablet 650 mg, 650 mg, Oral, Q4H PRN, Darin Ito, MD   amiodarone (PACERONE) tablet 200 mg, 200 mg, Oral, Daily, Darin Ito, MD   aspirin EC tablet 81 mg, 81 mg, Oral, Daily, Darin Ito, MD   atorvastatin (LIPITOR) tablet 40 mg, 40 mg, Oral, QHS, Darin Ito, MD   famotidine (PEPCID) tablet 20 mg, 20 mg, Oral, Daily PRN, Darin Ito, MD   fluticasone (FLONASE) 50 MCG/ACT nasal spray 1 spray, 1 spray, Each Nare, Daily PRN, Darin Ito, MD   heparin ADULT infusion 100 units/mL (25000 units/253mL), 1,400 Units/hr, Intravenous, Continuous, Darin Mcclure, Mcclure, Last Rate: 14 mL/hr at 03/05/24 0823, 1,400 Units/hr at 03/05/24 0823   insulin aspart (novoLOG) injection 0-9 Units, 0-9 Units, Subcutaneous, TID WC, Darin Ito, MD   levothyroxine (SYNTHROID) tablet 75 mcg, 75 mcg, Oral, QAC breakfast, Darin Ito, MD, 75 mcg at 03/05/24 0639   ondansetron [facility]) injection 4 mg, 4 mg, Intravenous, Q6H PRN, Darin Ito, MD   sodium chloride flush (NS) 0.9 % injection 3 mL, 3 mL, Intravenous, Q12H, Darin Ito, MD, 3 mL at 03/05/24 0244   sodium chloride flush (NS) 0.9 % injection 3 mL, 3 mL, Intravenous, PRN, Darin Ito, MD  Current Outpatient Medications:    acetaminophen (TYLENOL) 500 MG tablet, Take 1-2 tablets (500-1,000 mg total) by mouth every 6 (six) hours as needed., Disp: , Rfl:    amiodarone (PACERONE) 200 MG tablet, Take 1 tablet (200 mg total) by mouth daily., Disp: 30 tablet, Rfl: 1   amoxicillin (AMOXIL) 500 MG capsule, Take 2,000 mg by mouth See admin instructions. As needed for dental procedure, Disp: , Rfl:    apixaban (ELIQUIS) 5 MG TABS tablet, Take 1 tablet (5 mg total) by mouth 2 (two) times daily., Disp: 180 tablet, Rfl: 3   aspirin EC 81 MG tablet,  Take 81 mg by mouth daily., Disp: , Rfl:    atorvastatin (LIPITOR) 40 MG tablet, Take 1 tablet (40 mg total) by mouth at bedtime., Disp: 30 tablet, Rfl: 3   famotidine (PEPCID) 20 MG tablet, Take 1 tablet (20 mg total) by mouth daily. (Patient taking differently: Take 20 mg by mouth daily as needed for heartburn or indigestion.), Disp: 30 tablet, Rfl: 1   fluticasone (FLONASE) 50 MCG/ACT nasal spray, Place 1 spray into both nostrils daily as needed for allergies., Disp: , Rfl: 3   Inulin (FIBER CHOICE PO), Take 2 tablets by mouth daily., Disp: , Rfl:    JANUVIA 100 MG tablet, Take 100 mg by mouth daily., Disp: , Rfl:    levothyroxine (SYNTHROID) 75 MCG tablet, Take 75 mcg by mouth daily before breakfast., Disp: , Rfl:    Magnesium 500 MG CAPS, Take 1,000 mg by  mouth daily., Disp: , Rfl:    metFORMIN (GLUCOPHAGE) 1000 MG tablet, Take 1 tablet by mouth 2 (two) times daily., Disp: , Rfl:    metoprolol succinate (TOPROL XL) 50 MG 24 hr tablet, Take 1 tablet (50 mg total) by mouth in the morning and at bedtime., Disp: 180 tablet, Rfl: 3   NONFORMULARY OR COMPOUNDED ITEM, Topical antifungal topical solution: terbinafine 3%, fluconazole 2%, tea tree oil 5%, Urea 10%, ibuprofen2% in DMSO suspension. Sig: Apply to the affected toenail(s) once daily.  Order sent to [facility] [address] [place], [place] [address], Disp: 30 each, Rfl: 5   Omega-3 Fatty Acids (OMEGA-3 FISH OIL PO), Take 4,000 mg by mouth at bedtime., Disp: , Rfl:    ONE TOUCH ULTRA TEST test strip, USE 1 TO 2 TIMES A DAY, Disp: , Rfl: 5   Probiotic Product (PROBIOTIC DAILY PO), Take 1 capsule by mouth daily., Disp: , Rfl:    methylPREDNISolone (MEDROL DOSEPAK) 4 MG TBPK tablet, Take as directed until finished beginning the day of the left thoracentesis. (Patient not taking: Reported on 03/05/2024), Disp: 1 each, Rfl: 0   Results for orders placed or performed during the hospital encounter of 03/04/24 (from the past 48 hours)   Basic metabolic panel     Status: Abnormal   Collection Time: 03/04/24  4:47 PM  Result Value Ref Range   Sodium 138 135 - 145 mmol/L   Potassium 4.9 3.5 - 5.1 mmol/L   Chloride 103 98 - 111 mmol/L   CO2 24 22 - 32 mmol/L   Glucose, Bld 124 (H) 70 - 99 mg/dL    Comment: Glucose reference range applies only to samples taken after fasting for at least 8 hours.   BUN 18 8 - 23 mg/dL   Creatinine, Ser 6.04 (H) 0.61 - 1.24 mg/dL   Calcium 8.9 8.9 - 54.0 mg/dL   GFR, Estimated 43 (L) >60 mL/min    Comment: (NOTE) Calculated using the CKD-EPI Creatinine Equation (2021)    Anion gap 11 5 - 15    Comment: Performed at [facility], [address], [place], [place] [address]  CBC     Status: Abnormal   Collection Time: 03/04/24  4:47 PM  Result Value Ref Range   WBC 6.6 4.0 - 10.5 K/uL   RBC 3.69 (L) 4.22 - 5.81 MIL/uL   Hemoglobin 11.0 (L) 13.0 - 17.0 g/dL   HCT 14.7 (L) 82.9 - 56.2 %   MCV 95.7 80.0 - 100.0 fL   MCH 29.8 26.0 - 34.0 pg   MCHC 31.2 30.0 - 36.0 g/dL   RDW 13.0 86.5 - 78.4 %   Platelets 184 150 - 400 K/uL   nRBC 0.0 0.0 - 0.2 %    Comment: Performed at [facility], [address], [place], [place] [address]  Troponin I (High Sensitivity)     Status: Abnormal   Collection Time: 03/04/24  4:47 PM  Result Value Ref Range   Troponin I (High Sensitivity) 22 (H) <18 ng/L    Comment: (NOTE) Elevated high sensitivity troponin I (hsTnI) values and significant  changes across serial measurements may suggest ACS but many other  chronic and acute conditions are known to elevate hsTnI results.  Refer to the "Links" section for chest pain algorithms and additional  guidance. Performed at [facility], [address], [place], [place] [address]   Brain natriuretic peptide     Status: Abnormal  Collection Time: 03/04/24  4:47 PM  Result Value Ref Range   B Natriuretic Peptide 179.7 (H) 0.0 - 100.0 pg/mL    Comment: Performed at [facility],  [address] [place], [place] [address]  Troponin I (High Sensitivity)     Status: Abnormal   Collection Time: 03/04/24  6:30 PM  Result Value Ref Range   Troponin I (High Sensitivity) 25 (H) <18 ng/L    Comment: (NOTE) Elevated high sensitivity troponin I (hsTnI) values and significant  changes across serial measurements may suggest ACS but many other  chronic and acute conditions are known to elevate hsTnI results.  Refer to the "Links" section for chest pain algorithms and additional  guidance. Performed at [facility], [address], [place], [place] [address]   Hepatic function panel     Status: None   Collection Time: 03/05/24 12:30 AM  Result Value Ref Range   Total Protein 6.9 6.5 - 8.1 g/dL   Albumin 3.7 3.5 - 5.0 g/dL   AST 23 15 - 41 U/L   ALT 25 0 - 44 U/L   Alkaline Phosphatase 52 38 - 126 U/L   Total Bilirubin 0.9 0.0 - 1.2 mg/dL   Bilirubin, Direct 0.2 0.0 - 0.2 mg/dL   Indirect Bilirubin 0.7 0.3 - 0.9 mg/dL    Comment: Performed at [facility], [address], [place], [place] [address]  D-dimer, quantitative     Status: Abnormal   Collection Time: 03/05/24 12:30 AM  Result Value Ref Range   D-Dimer, Quant 2.35 (H) 0.00 - 0.50 ug/mL-FEU    Comment: (NOTE) At the manufacturer cut-off value of 0.5 g/mL FEU, this assay has a negative predictive value of 95-100%.This assay is intended for use in conjunction with a clinical pretest probability (PTP) assessment model to exclude pulmonary embolism (PE) and deep venous thrombosis (DVT) in outpatients suspected of PE or DVT. Results should be correlated with clinical presentation. Performed at [facility], [address] [place], [place] [address]   CBG monitoring, ED     Status: Abnormal   Collection Time: 03/05/24  7:47 AM  Result Value Ref Range   Glucose-Capillary 117 (H) 70 - 99 mg/dL    Comment: Glucose reference range applies only to samples taken after fasting for at least 8 hours.     CT Angio Chest PE W and/or Wo Contrast Result Date: 03/05/2024 CLINICAL DATA:  Evaluate for pulmonary embolism.  Positive D-dimer. EXAM: CT ANGIOGRAPHY CHEST WITH CONTRAST TECHNIQUE: Multidetector CT imaging of the chest was performed using the standard protocol during bolus administration of intravenous contrast. Multiplanar CT image reconstructions and MIPs were obtained to evaluate the vascular anatomy. RADIATION DOSE REDUCTION: This exam was performed according to the departmental dose-optimization program which includes automated exposure control, adjustment of the mA and/or kV according to patient size and/or use of iterative reconstruction technique. CONTRAST:  75mL OMNIPAQUE IOHEXOL 350 MG/ML SOLN COMPARISON:  Chest radiograph from 03/04/2024 FINDINGS: Cardiovascular: Satisfactory opacification of the pulmonary arteries to the segmental level. No evidence of pulmonary embolism. Heart size is upper limits of normal. Postoperative changes from CABG procedure. Aortic atherosclerosis. No significant pericardial effusion. Calcifications on the mitral valve. Mediastinum/Nodes: Thyroid gland, trachea, and esophagus appear normal. No supraclavicular, axillary, mediastinal or hilar adenopathy. Lungs/Pleura: Moderate left pleural effusion with compressive type atelectasis of the left lower lobe. There is subsegmental atelectasis within the lingula. No pneumothorax identified. Pulmonary nodule calcified granuloma within the  posteromedial right upper lobe. Right middle lobe nodule measures 4 mm, image 96/6. Upper Abdomen: No acute abnormality. Musculoskeletal: Postoperative changes from recent median sternotomy on 01/21/2024. Sternotomy defect remains distinct. No acute or suspicious osseous findings. Review of the MIP images confirms the above findings. IMPRESSION: 1. No evidence for acute pulmonary embolism. 2. Moderate left pleural effusion with compressive type atelectasis of the left lower lobe. 3.  Postoperative changes from recent median sternotomy and CABG procedure. Sternotomy defect remains distinct. 4. 4 mm right middle lobe pulmonary nodule. If the patient is at high risk for bronchogenic carcinoma, follow-up chest CT at 1year is recommended. If the patient is at low risk, no follow-up is needed. This recommendation follows the consensus statement: Guidelines for Management of Small Pulmonary Nodules Detected on CT Scans: A Statement from the Fleischner Society as published in Radiology 2005; 237:395-400. 5.  Aortic Atherosclerosis (ICD10-I70.0). Electronically Signed   By: Signa Kell M.D.   On: 03/05/2024 05:34   DG Chest Portable 1 View Result Date: 03/05/2024 CLINICAL DATA:  Initial evaluation for acute dyspnea. EXAM: PORTABLE CHEST 1 VIEW COMPARISON:  Prior radiograph from 03/04/2024. FINDINGS: Median sternotomy wires underlying CABG markers and surgical clips. Left atrial appendage clip noted. Cardiomegaly, stable. Mediastinal silhouette within normal limits. Aortic atherosclerosis. Lungs normally inflated. Layering left pleural effusion with associated dense left basilar opacity, which could reflect atelectasis or infiltrate. Lungs are otherwise clear. No pulmonary edema. No pneumothorax. No acute osseous finding. IMPRESSION: Moderate left pleural effusion with associated dense left basilar opacity, which could reflect atelectasis or infiltrate, relatively stable from prior. Electronically Signed   By: Rise Mu M.D.   On: 03/05/2024 00:51   DG Chest 2 View Result Date: 03/04/2024 CLINICAL DATA:  Shortness of breath.  Pleural effusion EXAM: CHEST - 2 VIEW COMPARISON:  X-ray 02/27/2024 and older FINDINGS: Postop chest. Atrial occlusion clip. Stable cardiopericardial silhouette calcified aorta. Increasing now moderate left effusion with adjacent lung opacity. No pneumothorax or edema. Dense left upper lobe lung nodule, likely calcified. Right lung is grossly clear. Degenerative  changes of the spine. IMPRESSION: Postop chest.  Moderate left effusion. Electronically Signed   By: Karen Kays M.D.   On: 03/04/2024 12:16   EP STUDY Result Date: 03/03/2024 See surgical note for result.   Blood pressure 125/66, pulse 60, temperature 97.8 F (36.6 C), resp. rate 16, height 6' (1.829 m), weight 100.7 kg, SpO2 100%. Physical Exam Vitals reviewed.  Constitutional:      General: He is not in acute distress.    Appearance: He is well-developed. He is not ill-appearing.  HENT:     Head: Normocephalic and atraumatic.  Cardiovascular:     Rate and Rhythm: Normal rate and regular rhythm.     Comments: Soft mitral murmur Pulmonary:     Effort: Pulmonary effort is normal. No tachypnea or respiratory distress.     Breath sounds: Examination of the left-lower field reveals decreased breath sounds. Decreased breath sounds present. No wheezing, rhonchi or rales.  Abdominal:     General: Bowel sounds are normal.     Palpations: Abdomen is soft. There is no hepatomegaly.  Musculoskeletal:     Right lower leg: Edema present.  Skin:    General: Skin is warm and dry.     Capillary Refill: Capillary refill takes less than 2 seconds.     Coloration: Skin is not cyanotic.     Comments: Right lower extremity EVH site with large seroma, no evidence of infection/cellulitis  Neurological:     General: No focal deficit present.     Mental Status: He is alert.  Psychiatric:        Mood and Affect: Mood normal.     Assessment/Plan: Status post CABG x 4 with left atrial appendage clip and maze procedure in February 2025 he has had recurrent left-sided pleural effusions status post previous thoracentesis x 2.  He also has an EVH site seroma on the right leg which may require I+D vs repeat drainage.  Will make Dr. Cliffton Mcclure aware of his admission.  Cardiology will continue to manage medical aspects of his care.  Glenice Laine Lulani Bour PA-C 03/05/2024, 9:08 AM

## 2024-03-05 NOTE — Progress Notes (Signed)
 PHARMACY - ANTICOAGULATION CONSULT NOTE  Pharmacy Consult for heparin Indication: atrial fibrillation  No Known Allergies  Patient Measurements: Height: 6' (182.9 cm) Weight: 100.7 kg (222 lb 0.1 oz) IBW/kg (Calculated) : 77.6 HEPARIN DW (KG): 98.1  Vital Signs: Temp: 97.7 F (36.5 C) (04/10 1800) Temp Source: Oral (04/10 1800) BP: 102/58 (04/10 1510) Pulse Rate: 63 (04/10 1510)  Labs: Recent Labs    03/04/24 1647 03/04/24 1830 03/05/24 1002 03/05/24 1014 03/05/24 1918  HGB 11.0*  --  11.3*  --   --   HCT 35.3*  --  36.9*  --   --   PLT 184  --  202  --   --   APTT  --   --   --  84* 77*  HEPARINUNFRC  --   --  >1.10*  --   --   CREATININE 1.60*  --   --   --   --   TROPONINIHS 22* 25*  --   --   --     Estimated Creatinine Clearance: 44.5 mL/min (A) (by C-G formula based on SCr of 1.6 mg/dL (H)).   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  aPTT is therapeutic at 77 seconds, on heparin infusion at 1400 units/hr. No s/sx of bleeding or infusion issues.   Goal of Therapy:  Heparin level 0.3-0.7 units/ml aPTT 66-102 seconds Monitor platelets by anticoagulation protocol: Yes   Plan:  Continue heparin infusion at 1400 units/hr. Monitor heparin levels, aPTT (while DOAC affects anti-Xa assay), and  CBC.  Thank you for allowing pharmacy to participate in this patient's care,  Sherron Monday, PharmD, BCCCP Clinical Pharmacist  Phone: [phone] 03/05/2024 8:00 PM  Please check AMION for all [facility] phone numbers After 10:00 PM, call Main Pharmacy [phone]

## 2024-03-05 NOTE — ED Notes (Signed)
Cards MD at BS ?

## 2024-03-06 ENCOUNTER — Inpatient Hospital Stay (HOSPITAL_COMMUNITY)

## 2024-03-06 ENCOUNTER — Ambulatory Visit: Payer: Self-pay | Admitting: Thoracic Surgery (Cardiothoracic Vascular Surgery)

## 2024-03-06 ENCOUNTER — Telehealth: Payer: Self-pay | Admitting: *Deleted

## 2024-03-06 DIAGNOSIS — J90 Pleural effusion, not elsewhere classified: Secondary | ICD-10-CM | POA: Diagnosis not present

## 2024-03-06 DIAGNOSIS — I5031 Acute diastolic (congestive) heart failure: Secondary | ICD-10-CM | POA: Diagnosis not present

## 2024-03-06 HISTORY — PX: IR THORACENTESIS ASP PLEURAL SPACE W/IMG GUIDE: IMG5380

## 2024-03-06 LAB — CBC
HCT: 32.3 % — ABNORMAL LOW (ref 39.0–52.0)
Hemoglobin: 10 g/dL — ABNORMAL LOW (ref 13.0–17.0)
MCH: 28.7 pg (ref 26.0–34.0)
MCHC: 31 g/dL (ref 30.0–36.0)
MCV: 92.6 fL (ref 80.0–100.0)
Platelets: 179 10*3/uL (ref 150–400)
RBC: 3.49 MIL/uL — ABNORMAL LOW (ref 4.22–5.81)
RDW: 15.4 % (ref 11.5–15.5)
WBC: 5.5 10*3/uL (ref 4.0–10.5)
nRBC: 0 % (ref 0.0–0.2)

## 2024-03-06 LAB — BASIC METABOLIC PANEL WITH GFR
Anion gap: 10 (ref 5–15)
BUN: 17 mg/dL (ref 8–23)
CO2: 25 mmol/L (ref 22–32)
Calcium: 8.5 mg/dL — ABNORMAL LOW (ref 8.9–10.3)
Chloride: 103 mmol/L (ref 98–111)
Creatinine, Ser: 1.67 mg/dL — ABNORMAL HIGH (ref 0.61–1.24)
GFR, Estimated: 41 mL/min — ABNORMAL LOW (ref >60)
Glucose, Bld: 132 mg/dL — ABNORMAL HIGH (ref 70–99)
Potassium: 3.4 mmol/L — ABNORMAL LOW (ref 3.5–5.1)
Sodium: 138 mmol/L (ref 135–145)

## 2024-03-06 LAB — APTT: aPTT: 86 s — ABNORMAL HIGH (ref 24–36)

## 2024-03-06 LAB — GLUCOSE, CAPILLARY
Glucose-Capillary: 139 mg/dL — ABNORMAL HIGH (ref 70–99)
Glucose-Capillary: 126 mg/dL — ABNORMAL HIGH (ref 70–99)

## 2024-03-06 LAB — TSH: TSH: 3.842 u[IU]/mL (ref 0.350–4.500)

## 2024-03-06 LAB — HEPARIN LEVEL (UNFRACTIONATED): Heparin Unfractionated: >1.1 [IU]/mL — ABNORMAL HIGH (ref 0.30–0.70)

## 2024-03-06 LAB — MAGNESIUM: Magnesium: 2 mg/dL (ref 1.7–2.4)

## 2024-03-06 MED ORDER — TORSEMIDE 20 MG PO TABS: 40.0000 mg | ORAL_TABLET | Freq: Every day | ORAL | Status: DC

## 2024-03-06 MED ORDER — BUMETANIDE 2 MG PO TABS
2.0000 mg | ORAL_TABLET | Freq: Every day | ORAL | 3 refills | Status: DC
Start: 2024-03-07 — End: 2024-03-13

## 2024-03-06 MED ORDER — LIDOCAINE HCL 1 % IJ SOLN
INTRAMUSCULAR | Status: AC
  Filled 2024-03-06: qty 20

## 2024-03-06 MED ORDER — LIDOCAINE HCL 1 % IJ SOLN
20.0000 mL | Freq: Once | INTRAMUSCULAR | Status: AC
  Administered 2024-03-06: 10 mL via INTRADERMAL

## 2024-03-06 MED ORDER — POTASSIUM CHLORIDE CRYS ER 20 MEQ PO TBCR
40.0000 meq | EXTENDED_RELEASE_TABLET | Freq: Once | ORAL | Status: AC
Start: 2024-03-06 — End: 2024-03-06
  Administered 2024-03-06: 40 meq via ORAL
  Filled 2024-03-06: qty 2

## 2024-03-06 MED ORDER — APIXABAN 5 MG PO TABS: 5.0000 mg | ORAL_TABLET | Freq: Two times a day (BID) | ORAL | Status: DC

## 2024-03-06 NOTE — TOC Transition Note (Signed)
 Transition of Care [facility]) - Discharge Note   Patient Details  Name: Darin Mcclure MRN: [id number] Date of Birth: 1941/11/11  Transition of [facility] CM/SW Contact:  Leone Haven, RN Phone Number: 03/06/2024, 4:06 PM   Clinical Narrative:    For dc today, wife at the bedside to transport him home.     Final next level of care: Home w Home Health Services Barriers to Discharge: No Barriers Identified   Patient Goals and CMS Choice Patient states their goals for this hospitalization and ongoing recovery are:: return home          Discharge Placement                       Discharge Plan and Services Additional resources added to the After Visit Summary for     Discharge Planning Services: CM Consult Post Acute Care Choice: Resumption of Svcs/PTA Provider          DME Arranged: N/A         HH Arranged: RN, PT HH Agency: [facility] Date HH Agency Contacted: 03/06/24 Time HH Agency Contacted: 1603 Representative spoke with at [facility] Agency: Adele Dan  Social Drivers of Health (SDOH) Interventions SDOH Screenings   Food Insecurity: No Food Insecurity (03/05/2024)  Housing: Low Risk  (03/05/2024)  Transportation Needs: No Transportation Needs (03/05/2024)  Utilities: Not At Risk (03/05/2024)  Financial Resource Strain: Low Risk  (12/30/2023)   Received from [facility]  Physical Activity: Insufficiently Active (07/31/2023)   Received from [facility]  Social Connections: Socially Integrated (03/05/2024)  Stress: No Stress Concern Present (07/31/2023)   Received from [facility]  Tobacco Use: Low Risk  (03/04/2024)     Readmission Risk Interventions    03/06/2024    4:00 PM  Readmission Risk Prevention Plan  Transportation Screening Complete  PCP or Specialist Appt within 5-7 Days Complete  Home Care Screening Complete  Medication Review (RN CM) Complete

## 2024-03-06 NOTE — TOC Initial Note (Signed)
 Transition of Care [facility]) - Initial/Assessment Note    Patient Details  Name: Darin Mcclure MRN: [id number] Date of Birth: [birth year]  Transition of Care [facility]) CM/SW Contact:    Leone Haven, RN Phone Number: 03/06/2024, 4:04 PM  Clinical Narrative:                 From home with spouse, has PCP and insurance on file, states he is currently active with [facility] for HHPT, NCM contacted Artavia to confirm, she states he has HHRN and HHPT right now, patient would like to continue with them.  NCM asked MD for orders.  He has no DME at home at this time.   States family member will transport them home at [employer] and family is support system, states gets medications from [facility].   Pta self ambulatory .  Expected Discharge Plan: Home w Home Health Services Barriers to Discharge: No Barriers Identified   Patient Goals and CMS Choice Patient states their goals for this hospitalization and ongoing recovery are:: return home          Expected Discharge Plan and Services   Discharge Planning Services: CM Consult Post Acute Care Choice: Resumption of Svcs/PTA Provider Living arrangements for the past 2 months: Single Family Home Expected Discharge Date: 03/06/24               DME Arranged: N/A         HH Arranged: RN, PT HH Agency: [facility] Date HH Agency Contacted: 03/06/24 Time HH Agency Contacted: 1603 Representative spoke with at [facility] Agency: Adele Dan  Prior Living Arrangements/Services Living arrangements for the past 2 months: Single Family Home Lives with:: Spouse Patient language and need for interpreter reviewed:: Yes Do you feel safe going back to the place where you live?: Yes      Need for Family Participation in Patient Care: Yes (Comment) Care giver support system in place?: Yes (comment) Current home services: Home PT, Home RN Criminal Activity/Legal Involvement Pertinent to Current Situation/Hospitalization: No - Comment  as needed  Activities of Daily Living   ADL Screening (condition at time of admission) Independently performs ADLs?: Yes (appropriate for developmental age) Is the patient deaf or have difficulty hearing?: No Does the patient have difficulty seeing, even when wearing glasses/contacts?: No Does the patient have difficulty concentrating, remembering, or making decisions?: No  Permission Sought/Granted Permission sought to share information with : Case Manager, Family Supports Permission granted to share information with : Yes, Verbal Permission Granted     Permission granted to share info w AGENCY: HH        Emotional Assessment Appearance:: Appears stated age Attitude/Demeanor/Rapport: Engaged Affect (typically observed): Appropriate Orientation: : Oriented to Self, Oriented to Place, Oriented to  Time, Oriented to Situation Alcohol / Substance Use: Not Applicable Psych Involvement: No (comment)  Admission diagnosis:  CHF (congestive heart failure) (HCC) [I50.9] DOE (dyspnea on exertion) [R06.09] Patient Active Problem List   Diagnosis Date Noted   CHF (congestive heart failure) (HCC) 03/05/2024   Pleural effusion on left 03/05/2024   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   Testing of male for genetic disease carrier status 01/26/2024   History of left atrial appendage closure 01/26/2024   Coronary artery disease 01/21/2024   New onset atrial fibrillation (HCC) 01/14/2024   Influenza A 01/14/2024   Type 2 diabetes mellitus (HCC) 01/14/2024   Hypertension associated with diabetes (HCC) 01/14/2024   Chronic kidney disease, stage 3a (HCC) 01/14/2024  Hypothyroidism 01/14/2024   Hyperlipidemia associated with type 2 diabetes mellitus (HCC) 01/14/2024   Coronary artery disease involving native coronary artery of native heart without angina pectoris 12/29/2021   Pure hypercholesterolemia 12/29/2021   Right bundle branch block 12/29/2021   Carotid artery disease (HCC) 12/29/2021    Gastroesophageal reflux disease without esophagitis 04/19/2021   Impingement syndrome of left shoulder region 05/17/2020   History of prosthetic unicompartmental arthroplasty of left knee 11/18/2019   Pain in left knee 09/24/2018   Pain of left hip joint 09/24/2018   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) 08/26/2017   Muscle cramp 08/26/2017   PCP:  Eartha Inch, MD Pharmacy:   [facility] - [place], [place] - [phone] [place] [address] Phone: [phone] Fax: [phone]  [facility] - [place], [place] - [address] [place] [address] Phone: [phone] Fax: [phone]  [facility] [address] [place] [address] Phone: [phone] Fax: [phone]  [facility] - [place], [place] - [address] [facility] AT Portal to Registered [facility] Sites [address] [place] [address] Phone: [phone] Fax: [phone]     Social Drivers of Health (SDOH) Social History: SDOH Screenings   Food Insecurity: No Food Insecurity (03/05/2024)  Housing: Low Risk  (03/05/2024)  Transportation Needs: No Transportation Needs (03/05/2024)  Utilities: Not At Risk (03/05/2024)  Financial Resource Strain: Low Risk  (12/30/2023)   Received from [facility]  Physical Activity: Insufficiently Active (07/31/2023)   Received from [facility]  Social Connections: Socially Integrated (03/05/2024)  Stress: No Stress Concern Present (07/31/2023)   Received from [facility]  Tobacco Use: Low Risk  (03/04/2024)   SDOH Interventions:     Readmission Risk Interventions    03/06/2024    4:00 PM  Readmission Risk Prevention Plan  Transportation Screening Complete  PCP or Specialist Appt within 5-7 Days Complete  Home Care Screening Complete  Medication Review (RN CM) Complete

## 2024-03-06 NOTE — Progress Notes (Signed)
      [address]       [place] [address]             [phone]    Unfortunately Dr. Cliffton Asters is currently ill and not available to do any procedure today.  He is also on vacation next week.  The plan would be for a thoracentesis while the patient is in the hospital and outpatient follow-up in the office.  I can schedule this appointment for the patient.  I will discuss it with the patient.  Rowe Clack, PA-C

## 2024-03-06 NOTE — Telephone Encounter (Signed)
 Called spoke to patient . Patient is aware to go to [facility] [address].  Patient states Wynema Birch informed him to [facility] 03/12/24 before his appt with Wynema Birch.  RN informed patient  he can go either 03/11/24 or 4/17. Patient verbalized understanding

## 2024-03-06 NOTE — Progress Notes (Signed)
 Rounding Note    Patient Name: Darin Mcclure Date of Encounter: 03/06/2024  [facility] Cardiologist: Donato Schultz, MD   Subjective   Reports breathing has improved overnight.  He was net -1.1 L overall 2.7 L negative since admission.  Echo showed high LV filling pressure with normal LVEF 60 to 65%.  Diastolic function was indeterminate, primarily on my review as there are no clear A waves.  This could suggest stunning related to recent cardioversion just 3 days ago suggesting he may not have recovered atrial mechanical activity.  He did undergo successful thoracentesis today with a postthoracentesis x-ray that showed decreased pleural fluid and residual left basilar atelectasis and no pneumothorax.  Inpatient Medications    Scheduled Meds:  amiodarone  200 mg Oral Daily   aspirin EC  81 mg Oral Daily   atorvastatin  40 mg Oral QHS   insulin aspart  0-9 Units Subcutaneous TID WC   levothyroxine  75 mcg Oral QAC breakfast   sodium chloride flush  3 mL Intravenous Q12H   Continuous Infusions:  heparin 1,400 Units/hr (03/06/24 0300)   PRN Meds: acetaminophen, famotidine, fluticasone, ondansetron (ZOFRAN) IV, sodium chloride flush   Vital Signs    Vitals:   03/05/24 2057 03/06/24 0113 03/06/24 0500 03/06/24 0504  BP: 138/73 118/72  130/72  Pulse: 68 66  64  Resp: 18 18  18   Temp: 98.1 F (36.7 C) 98.1 F (36.7 C)  98.1 F (36.7 C)  TempSrc: Oral Oral  Oral  SpO2: 100% 97%    Weight:   98.4 kg   Height:        Intake/Output Summary (Last 24 hours) at 03/06/2024 0903 Last data filed at 03/06/2024 0700 Gross per 24 hour  Intake 487.77 ml  Output 1250 ml  Net -762.23 ml      03/06/2024    5:00 AM 03/04/2024    4:08 PM 03/03/2024    8:41 AM  Last 3 Weights  Weight (lbs) 216 lb 14.9 oz 222 lb 0.1 oz 222 lb  Weight (kg) 98.4 kg 100.7 kg 100.699 kg      Telemetry    Sinus rhythm- Personally Reviewed  ECG    N/A  Physical Exam   GEN: No acute  distress.   Neck: No JVD Cardiac: RRR, no murmurs, rubs, or gallops.  Respiratory: Decreased breath sounds at the left base GI: Soft, nontender, non-distended  MS: Trace bilateral pedal edema - no deformity. Neuro:  Nonfocal  Psych: Normal affect   Labs    High Sensitivity Troponin:   Recent Labs  Lab 03/04/24 1647 03/04/24 1830  TROPONINIHS 22* 25*     Chemistry Recent Labs  Lab 03/04/24 1647 03/05/24 0030 03/06/24 0317  NA 138  --  138  K 4.9  --  3.4*  CL 103  --  103  CO2 24  --  25  GLUCOSE 124*  --  132*  BUN 18  --  17  CREATININE 1.60*  --  1.67*  CALCIUM 8.9  --  8.5*  MG  --   --  2.0  PROT  --  6.9  --   ALBUMIN  --  3.7  --   AST  --  23  --   ALT  --  25  --   ALKPHOS  --  52  --   BILITOT  --  0.9  --   GFRNONAA 43*  --  41*  ANIONGAP 11  --  10    Lipids No results for input(s): "CHOL", "TRIG", "HDL", "LABVLDL", "LDLCALC", "CHOLHDL" in the last 168 hours.  Hematology Recent Labs  Lab 03/04/24 1647 03/05/24 1002 03/06/24 0317  WBC 6.6 6.3 5.5  RBC 3.69* 3.85* 3.49*  HGB 11.0* 11.3* 10.0*  HCT 35.3* 36.9* 32.3*  MCV 95.7 95.8 92.6  MCH 29.8 29.4 28.7  MCHC 31.2 30.6 31.0  RDW 15.1 15.4 15.4  PLT 184 202 179   Thyroid  Recent Labs  Lab 03/06/24 0317  TSH 3.842    BNP Recent Labs  Lab 03/04/24 1647  BNP 179.7*    DDimer  Recent Labs  Lab 03/05/24 0030  DDIMER 2.35*     Radiology    ECHOCARDIOGRAM COMPLETE Result Date: 03/05/2024    ECHOCARDIOGRAM REPORT   Patient Name:   Darin Mcclure Date of Exam: 03/05/2024 Medical Rec #:  [id number]         Height:       72.0 in Accession #:    [id number]        Weight:       222.0 lb Date of Birth:  1940/12/24        BSA:          2.227 m Patient Age:    82 years          BP:           131/68 mmHg Patient Gender: M                 HR:           67 bpm. Exam Location:  Inpatient Procedure: 2D Echo, Color Doppler and Cardiac Doppler (Both Spectral and Color            Flow Doppler were  utilized during procedure). Indications:    I50.31 Acute diastolic (congestive) heart failure  History:        Patient has prior history of Echocardiogram examinations, most                 recent 01/21/2024. CHF, Prior CABG, Arrythmias:RBBB; Risk                 Factors:Dyslipidemia, Hypertension and Diabetes. 01/21/24 CABG,                 Maze, LAA Clip.  Sonographer:    Irving Burton Senior RDCS Referring Phys: [id number] Karl Ito IMPRESSIONS  1. Left ventricular ejection fraction, by estimation, is 60 to 65%. The left ventricle has normal function. The left ventricle has no regional wall motion abnormalities. There is mild concentric left ventricular hypertrophy. Elevated left ventricular end-diastolic pressure.  2. Right ventricular systolic function is mildly reduced. The right ventricular size is mildly enlarged. There is mildly elevated pulmonary artery systolic pressure.  3. Left atrial size was mildly dilated.  4. The mitral valve is degenerative. Mild mitral valve regurgitation. The mean mitral valve gradient is 3.0 mmHg. Moderate mitral annular calcification.  5. The aortic valve has an indeterminant number of cusps. Aortic valve regurgitation is not visualized.  6. There is mild dilatation of the ascending aorta, measuring 39 mm.  7. The inferior vena cava is dilated in size with >50% respiratory variability, suggesting right atrial pressure of 8 mmHg. FINDINGS  Left Ventricle: Left ventricular ejection fraction, by estimation, is 60 to 65%. The left ventricle has normal function. The left ventricle has no regional wall motion abnormalities. The left ventricular internal cavity size was normal in size.  There is  mild concentric left ventricular hypertrophy. Left ventricular diastolic function could not be evaluated due to mitral annular calcification (moderate or greater). Elevated left ventricular end-diastolic pressure. The E/e' is 36.7. Right Ventricle: The right ventricular size is mildly enlarged. No  increase in right ventricular wall thickness. Right ventricular systolic function is mildly reduced. There is mildly elevated pulmonary artery systolic pressure. The tricuspid regurgitant  velocity is 2.82 m/s, and with an assumed right atrial pressure of 8 mmHg, the estimated right ventricular systolic pressure is 39.8 mmHg. Left Atrium: Left atrial size was mildly dilated. Right Atrium: Right atrial size was normal in size. Pericardium: There is no evidence of pericardial effusion. Mitral Valve: The mitral valve is degenerative in appearance. Moderate mitral annular calcification. Mild mitral valve regurgitation. MV peak gradient, 7.4 mmHg. The mean mitral valve gradient is 3.0 mmHg with average heart rate of 66 bpm. Tricuspid Valve: The tricuspid valve is normal in structure. Tricuspid valve regurgitation is mild. Aortic Valve: The aortic valve has an indeterminant number of cusps. Aortic valve regurgitation is not visualized. Pulmonic Valve: The pulmonic valve was normal in structure. Pulmonic valve regurgitation is trivial. Aorta: There is mild dilatation of the ascending aorta, measuring 39 mm. Venous: The inferior vena cava is dilated in size with greater than 50% respiratory variability, suggesting right atrial pressure of 8 mmHg. IAS/Shunts: The interatrial septum was not well visualized.  LEFT VENTRICLE PLAX 2D LVIDd:         4.40 cm   Diastology LVIDs:         2.90 cm   LV e' medial:    3.71 cm/s LV PW:         1.20 cm   LV E/e' medial:  36.7 LV IVS:        1.10 cm   LV e' lateral:   5.57 cm/s LVOT diam:     2.30 cm   LV E/e' lateral: 24.4 LV SV:         84 LV SV Index:   38 LVOT Area:     4.15 cm  RIGHT VENTRICLE RV S prime:     7.28 cm/s TAPSE (M-mode): 1.2 cm LEFT ATRIUM             Index        RIGHT ATRIUM           Index LA diam:        4.00 cm 1.80 cm/m   RA Area:     22.80 cm LA Vol (A2C):   78.9 ml 35.43 ml/m  RA Volume:   67.60 ml  30.35 ml/m LA Vol (A4C):   68.2 ml 30.62 ml/m LA Biplane  Vol: 74.5 ml 33.45 ml/m  AORTIC VALVE LVOT Vmax:   91.70 cm/s LVOT Vmean:  66.400 cm/s LVOT VTI:    0.203 m  AORTA Ao Root diam: 3.30 cm Ao Asc diam:  3.90 cm MITRAL VALVE                TRICUSPID VALVE MV Area (PHT): 3.08 cm     TR Peak grad:   31.8 mmHg MV Area VTI:   2.26 cm     TR Vmax:        282.00 cm/s MV Peak grad:  7.4 mmHg MV Mean grad:  3.0 mmHg     SHUNTS MV Vmax:       1.36 m/s     Systemic VTI:  0.20 m MV Vmean:  77.4 cm/s    Systemic Diam: 2.30 cm MV Decel Time: 246 msec MV E velocity: 136.00 cm/s MV A velocity: 32.70 cm/s MV E/A ratio:  4.16 Kardie Tobb DO Electronically signed by Thomasene Ripple DO Signature Date/Time: 03/05/2024/1:43:32 PM    Final    CT Angio Chest PE W and/or Wo Contrast Result Date: 03/05/2024 CLINICAL DATA:  Evaluate for pulmonary embolism.  Positive D-dimer. EXAM: CT ANGIOGRAPHY CHEST WITH CONTRAST TECHNIQUE: Multidetector CT imaging of the chest was performed using the standard protocol during bolus administration of intravenous contrast. Multiplanar CT image reconstructions and MIPs were obtained to evaluate the vascular anatomy. RADIATION DOSE REDUCTION: This exam was performed according to the departmental dose-optimization program which includes automated exposure control, adjustment of the mA and/or kV according to patient size and/or use of iterative reconstruction technique. CONTRAST:  75mL OMNIPAQUE IOHEXOL 350 MG/ML SOLN COMPARISON:  Chest radiograph from 03/04/2024 FINDINGS: Cardiovascular: Satisfactory opacification of the pulmonary arteries to the segmental level. No evidence of pulmonary embolism. Heart size is upper limits of normal. Postoperative changes from CABG procedure. Aortic atherosclerosis. No significant pericardial effusion. Calcifications on the mitral valve. Mediastinum/Nodes: Thyroid gland, trachea, and esophagus appear normal. No supraclavicular, axillary, mediastinal or hilar adenopathy. Lungs/Pleura: Moderate left pleural effusion with  compressive type atelectasis of the left lower lobe. There is subsegmental atelectasis within the lingula. No pneumothorax identified. Pulmonary nodule calcified granuloma within the posteromedial right upper lobe. Right middle lobe nodule measures 4 mm, image 96/6. Upper Abdomen: No acute abnormality. Musculoskeletal: Postoperative changes from recent median sternotomy on 01/21/2024. Sternotomy defect remains distinct. No acute or suspicious osseous findings. Review of the MIP images confirms the above findings. IMPRESSION: 1. No evidence for acute pulmonary embolism. 2. Moderate left pleural effusion with compressive type atelectasis of the left lower lobe. 3. Postoperative changes from recent median sternotomy and CABG procedure. Sternotomy defect remains distinct. 4. 4 mm right middle lobe pulmonary nodule. If the patient is at high risk for bronchogenic carcinoma, follow-up chest CT at 1year is recommended. If the patient is at low risk, no follow-up is needed. This recommendation follows the consensus statement: Guidelines for Management of Small Pulmonary Nodules Detected on CT Scans: A Statement from the Fleischner Society as published in Radiology 2005; 237:395-400. 5.  Aortic Atherosclerosis (ICD10-I70.0). Electronically Signed   By: Signa Kell M.D.   On: 03/05/2024 05:34   DG Chest Portable 1 View Result Date: 03/05/2024 CLINICAL DATA:  Initial evaluation for acute dyspnea. EXAM: PORTABLE CHEST 1 VIEW COMPARISON:  Prior radiograph from 03/04/2024. FINDINGS: Median sternotomy wires underlying CABG markers and surgical clips. Left atrial appendage clip noted. Cardiomegaly, stable. Mediastinal silhouette within normal limits. Aortic atherosclerosis. Lungs normally inflated. Layering left pleural effusion with associated dense left basilar opacity, which could reflect atelectasis or infiltrate. Lungs are otherwise clear. No pulmonary edema. No pneumothorax. No acute osseous finding. IMPRESSION:  Moderate left pleural effusion with associated dense left basilar opacity, which could reflect atelectasis or infiltrate, relatively stable from prior. Electronically Signed   By: Rise Mu M.D.   On: 03/05/2024 00:51   DG Chest 2 View Result Date: 03/04/2024 CLINICAL DATA:  Shortness of breath.  Pleural effusion EXAM: CHEST - 2 VIEW COMPARISON:  X-ray 02/27/2024 and older FINDINGS: Postop chest. Atrial occlusion clip. Stable cardiopericardial silhouette calcified aorta. Increasing now moderate left effusion with adjacent lung opacity. No pneumothorax or edema. Dense left upper lobe lung nodule, likely calcified. Right lung is grossly clear. Degenerative changes of the spine.  IMPRESSION: Postop chest.  Moderate left effusion. Electronically Signed   By: Karen Kays M.D.   On: 03/04/2024 12:16    Cardiac Studies   See echo above  Patient Profile     83 y.o. male with PMH of CAD s/p 4v CABG 01/21/2024, PAF s/p MAZE and LAA clip (01/21/24) on Eliquis, HTN, HLD, DM II, hypothyroidism and GERD who presented with SOB and recurrent left pleural effusion. He previously had left pleural effusion s/p thoracentesis after CABG and recent DCCV for afib. SOB worsened after DCCV. He was placed on 40mg  BID oral lasix, however symptom did not improve prompting the patient to come in. CXR on arrival showed moderate left pleural effusion.   Assessment & Plan    Recurrent left pleural effusion  - recurrent L pleural effusion since CABG and underwent thoracentesis previously - had repeat thoracentesis today after 1 week re-accumulation of fluid with good response. - Patient also has vein harvest site seroma.   - Dr. Cliffton Asters is not available for surgery, so plan was for repeat thoracentesis today and follow-up with Dr. Cliffton Asters as outpatient.   Acute diastolic CHF - Diuresed 1.1L overnight, overall 2.7L negative- echo shows normal systolic function but there appeared to be diastolic dysfunction with  elevated LVEDp yesterday.  There are no clear A wave suggesting that he has not achieved atrial mechanical activity after recent cardioversion.  Unfortunately only time can improve this. - Would recommend maintenance diuretic therapy due to probable diastolic heart failure.  He had a poor response to furosemide, will start torsemide 40 mg daily.  PAF/aflutter s/p DCCV 03/03/2024 - Maintaining sinus rhythm, however, does not appear to have regained atrial mechanical activity - Restart Eliquis - he is s/p LAA clipping - Platelets improved after surgery - continue aspirin 81 mg for 1 year post-CABG per guidelines.  4.    Hypokalemia  - potassium 3.2 today, replete with 40 MEQ x 1  Encouraged ambulation and use of IS device which he has at home. He can likely be discharged this afternoon. Will need close follow-up with cardiology next week and then follow-up with Dr. Cliffton Asters after.  For questions or updates, please contact [facility] Please consult [email or web address] for contact info under   Chrystie Nose, MD, Milagros Loll  [place]  [facility]  Medical Director of the [facility]  [facility] Diplomate of the American Board of Clinical Lipidology Attending Cardiologist  Direct Dial: [phone]  Fax: [phone]  Website:  [email or web address]

## 2024-03-06 NOTE — Discharge Instructions (Signed)
 Contact us if you have increasing shortness of breath  Please obtain repeat chest x ray prior to the next visit. Our nurse will reach out to you to arrange it  You can resume Eliquis around 10PM tonight.

## 2024-03-06 NOTE — Procedures (Signed)
 PROCEDURE SUMMARY:  Successful image-guided left-sided therapeutic thoracentesis. Yielded 1.1 liters of clear, amber-colored pleural fluid. Patient tolerated procedure well. EBL: Zero No immediate complications.  Post procedure CXR shows no pneumothorax.  Please see imaging section of Epic for full dictation.  Sable Feil PA-C 03/06/2024 12:29 PM

## 2024-03-06 NOTE — Telephone Encounter (Signed)
-----   Message from [place] sent at 03/06/2024  3:08 PM EDT ----- Regarding: schedule outpatient chest x ray This patient will need a outpatient 2 view CXR either in the morning of 4/17 or anytime on 4/16 for left pleural effusion. He just underwent thoracentesis today, I want to make that pleural effusion does not come back. He is going to see me on 4/17 at 9:15AM, I would prefer to have that chest x ray done (but not necessarily read) prior to the visit.   Can you all help me with the order and tell the patient where to go?  Thank you  Azalee Course

## 2024-03-06 NOTE — Progress Notes (Signed)
 PHARMACY - ANTICOAGULATION CONSULT NOTE  Pharmacy Consult for heparin > apixaban Indication: atrial fibrillation  No Known Allergies  Patient Measurements: Height: 6' (182.9 cm) Weight: 98.4 kg (216 lb 14.9 oz) IBW/kg (Calculated) : 77.6 HEPARIN DW (KG): 98.1  Vital Signs: Temp: 97.2 F (36.2 C) (04/11 1157) Temp Source: Oral (04/11 1157) BP: 124/64 (04/11 1157) Pulse Rate: 61 (04/11 1157)  Labs: Recent Labs    03/04/24 1647 03/04/24 1830 03/05/24 1002 03/05/24 1014 03/05/24 1918 03/06/24 0317  HGB 11.0*  --  11.3*  --   --  10.0*  HCT 35.3*  --  36.9*  --   --  32.3*  PLT 184  --  202  --   --  179  APTT  --   --   --  84* 77* 86*  HEPARINUNFRC  --   --  >1.10*  --   --  >1.10*  CREATININE 1.60*  --   --   --   --  1.67*  TROPONINIHS 22* 25*  --   --   --   --     Estimated Creatinine Clearance: 42.1 mL/min (A) (by C-G formula based on SCr of 1.67 mg/dL (H)).   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  aPTT is therapeutic at 86 seconds, on heparin infusion at 1400 units/hr. No s/sx of bleeding or infusion issues.  S/p thoracentesis plan for outpt follow up    Goal of Therapy:  Heparin level 0.3-0.7 units/ml aPTT 66-102 seconds Monitor platelets by anticoagulation protocol: Yes   Plan:  Stop heparin   Restart apixaban 5mg  BID   Leota Sauers Pharm.D. CPP, BCPS Clinical Pharmacist [phone] 03/06/2024 12:15 PM   Please check AMION for all [facility] phone numbers After 10:00 PM, call Main Pharmacy [phone]

## 2024-03-06 NOTE — Discharge Summary (Signed)
 Discharge Summary    Patient ID: Darin Mcclure MRN: [id number]; DOB: [birth year]  Admit date: 03/04/2024 Discharge date: 03/06/2024  PCP:  Eartha Inch, MD   [facility] Providers Cardiologist:  Donato Schultz, MD        Discharge Diagnoses    Principal Problem:   CHF (congestive heart failure) [facility]) Active Problems:   Coronary artery disease involving native coronary artery of native heart without angina pectoris   Type 2 diabetes mellitus (HCC)   Hypertension associated with diabetes (HCC)   Hyperlipidemia associated with type 2 diabetes mellitus (HCC)   Pleural effusion on left    Diagnostic Studies/Procedures    Echo 03/05/2024 1. Left ventricular ejection fraction, by estimation, is 60 to 65%. The  left ventricle has normal function. The left ventricle has no regional  wall motion abnormalities. There is mild concentric left ventricular  hypertrophy. Elevated left ventricular  end-diastolic pressure.   2. Right ventricular systolic function is mildly reduced. The right  ventricular size is mildly enlarged. There is mildly elevated pulmonary  artery systolic pressure.   3. Left atrial size was mildly dilated.   4. The mitral valve is degenerative. Mild mitral valve regurgitation. The  mean mitral valve gradient is 3.0 mmHg. Moderate mitral annular  calcification.   5. The aortic valve has an indeterminant number of cusps. Aortic valve  regurgitation is not visualized.   6. There is mild dilatation of the ascending aorta, measuring 39 mm.   7. The inferior vena cava is dilated in size with >50% respiratory  variability, suggesting right atrial pressure of 8 mmHg.   _____________   History of Present Illness     Darin Mcclure is a 83 y.o. male with CAD s/p 4V CABG (01/21/24), PAF s/p MAZE and LAA Clip (01/21/24) on Eliquis, HTN, HLD, DM2, hypothyroidism and GERD who is being seen 03/05/2024 for the evaluation of DOE.   Darin Mcclure recently  underwent a 4V CABG on 2/25 for obstructive coronary disease.  His hospital course was largely uncomplicated.  Upon returning home the patient noted that he felt more short of breath with exertion.  Initially he describes being able to walk roughly 100 feet before feeling winded (at baseline he has no DOE) which is unusual for him.  He was evaluated and found to have a left sided pleural effusion which was drained with thoracentesis.  The patient went back into atrial fibrillation after he was discharged from the hospital so underwent elective DCCV on /8/25.  Shortly after the DCCV the patient notices that his DOE has gotten progressively worse to where now he can only walk a few feet before becoming winded.  He called his surgeon to notify him of the symptoms and he was recommended to get a CXR.  He then notified his primary cardiologist and was prescribed Lasix 40 mg twice daily.  Despite taking this and even doubling his dose he saw little improvement and was instructed to come to the ED for further evaluation.   In the ED his VS were afebrile, BP 129/60, HR 64, RR 17 and satting 100% on RA.  Labs notable for creatinine 1.6, hemoglobin 11, troponins 22 -> 25, BNP 179.7 and D-dimer 2.35.  CXR revealed a moderate left pleural effusion.  CT PE is still pending final read.  ECG showed RBBB.  The patient is being admitted for DOE.  Hospital Course     Consultants: N/A   Patient was admitted to cardiology  service.  Chest x-ray showed a moderate left pleural effusion.  D-dimer was elevated.  A CTA of the chest obtained on/08/2024 which showed no PE, moderate left pleural effusion with compressive type atelectasis of the left lower lobe, 4 mm right middle lobe pulmonary nodule, follow-up CT in 1 year recommended.  Eliquis held, patient was given a single dose of IV Lasix 80 mg.  He had significant urine output and put out 2.7 L.  Weight came down from 222 pounds down to 216.9 pounds.  Echocardiogram obtained on  03/05/2024 showed EF 60 to 65%, no regional wall motion abnormality, mildly enlarged RV with mildly reduced RVEF, mild MR, mildly dilated ascending aorta measuring at 39 mm.  CT surgery was consulted for recurrent pleural effusion.  Patient also has a endovascular harvesting site seroma in the right leg that may require incision and drainage.  Eventually, it was decided for the patient to undergo repeat thoracentesis in the hospital and follow-up with Dr. Cliffton Asters as outpatient.  Interventional radiology service was consulted and patient underwent left thoracentesis with removal of 1.1 L of clear amber-colored pleural fluid.  He tolerated the procedure well.  Postprocedure chest x-ray showed no pneumothorax.  Patient was seen by Dr. Rennis Golden postprocedure at which time he was doing well.  He is stable for discharge from the cardiac perspective. He will be discharged on 2 mg of bumex which cost around $3 (whereas torsemide 40mg  daily 30 day supply cost $140), he did not respond to lasix very well.       Did the patient have an acute coronary syndrome (MI, NSTEMI, STEMI, etc) this admission?:  No                               Did the patient have a percutaneous coronary intervention (stent / angioplasty)?:  No.          _____________  Discharge Vitals Blood pressure 124/64, pulse 61, temperature (!) 97.2 F (36.2 C), temperature source Oral, resp. rate 14, height 6' (1.829 m), weight 98.4 kg, SpO2 98%.  Filed Weights   03/04/24 1608 03/06/24 0500  Weight: 100.7 kg 98.4 kg    Labs & Radiologic Studies    CBC Recent Labs    03/05/24 1002 03/06/24 0317  WBC 6.3 5.5  HGB 11.3* 10.0*  HCT 36.9* 32.3*  MCV 95.8 92.6  PLT 202 179   Basic Metabolic Panel Recent Labs    04/54/09 1647 03/06/24 0317  NA 138 138  K 4.9 3.4*  CL 103 103  CO2 24 25  GLUCOSE 124* 132*  BUN 18 17  CREATININE 1.60* 1.67*  CALCIUM 8.9 8.5*  MG  --  2.0   Liver Function Tests Recent Labs    03/05/24 0030   AST 23  ALT 25  ALKPHOS 52  BILITOT 0.9  PROT 6.9  ALBUMIN 3.7   No results for input(s): "LIPASE", "AMYLASE" in the last 72 hours. High Sensitivity Troponin:   Recent Labs  Lab 03/04/24 1647 03/04/24 1830  TROPONINIHS 22* 25*    BNP Invalid input(s): "POCBNP" D-Dimer Recent Labs    03/05/24 0030  DDIMER 2.35*   Hemoglobin A1C No results for input(s): "HGBA1C" in the last 72 hours. Fasting Lipid Panel No results for input(s): "CHOL", "HDL", "LDLCALC", "TRIG", "CHOLHDL", "LDLDIRECT" in the last 72 hours. Thyroid Function Tests Recent Labs    03/06/24 0317  TSH 3.842   _____________  Varney Biles  Chest 1 View Result Date: 03/06/2024 CLINICAL DATA:  Status post left thoracentesis. EXAM: CHEST  1 VIEW COMPARISON:  03/04/2024.  Chest CTA dated 03/05/2024. FINDINGS: The cardiac silhouette remains borderline enlarged with post CABG changes and a left atrial clip. Decreased left pleural fluid with residual left basilar atelectasis. Clear right lung. No pneumothorax. Small calcified granuloma in the medial right upper lobe. Thoracic spine degenerative changes. IMPRESSION: Decreased left pleural fluid with residual left basilar atelectasis. No pneumothorax. Electronically Signed   By: Beckie Salts M.D.   On: 03/06/2024 10:10   ECHOCARDIOGRAM COMPLETE Result Date: 03/05/2024    ECHOCARDIOGRAM REPORT   Patient Name:   Darin Mcclure Date of Exam: 03/05/2024 Medical Rec #:  [id number]         Height:       72.0 in Accession #:    [id number]        Weight:       222.0 lb Date of Birth:  07-18-1941        BSA:          2.227 m Patient Age:    82 years          BP:           131/68 mmHg Patient Gender: M                 HR:           67 bpm. Exam Location:  Inpatient Procedure: 2D Echo, Color Doppler and Cardiac Doppler (Both Spectral and Color            Flow Doppler were utilized during procedure). Indications:    I50.31 Acute diastolic (congestive) heart failure  History:        Patient has prior  history of Echocardiogram examinations, most                 recent 01/21/2024. CHF, Prior CABG, Arrythmias:RBBB; Risk                 Factors:Dyslipidemia, Hypertension and Diabetes. 01/21/24 CABG,                 Maze, LAA Clip.  Sonographer:    Irving Burton Senior RDCS Referring Phys: [id number] Karl Ito IMPRESSIONS  1. Left ventricular ejection fraction, by estimation, is 60 to 65%. The left ventricle has normal function. The left ventricle has no regional wall motion abnormalities. There is mild concentric left ventricular hypertrophy. Elevated left ventricular end-diastolic pressure.  2. Right ventricular systolic function is mildly reduced. The right ventricular size is mildly enlarged. There is mildly elevated pulmonary artery systolic pressure.  3. Left atrial size was mildly dilated.  4. The mitral valve is degenerative. Mild mitral valve regurgitation. The mean mitral valve gradient is 3.0 mmHg. Moderate mitral annular calcification.  5. The aortic valve has an indeterminant number of cusps. Aortic valve regurgitation is not visualized.  6. There is mild dilatation of the ascending aorta, measuring 39 mm.  7. The inferior vena cava is dilated in size with >50% respiratory variability, suggesting right atrial pressure of 8 mmHg. FINDINGS  Left Ventricle: Left ventricular ejection fraction, by estimation, is 60 to 65%. The left ventricle has normal function. The left ventricle has no regional wall motion abnormalities. The left ventricular internal cavity size was normal in size. There is  mild concentric left ventricular hypertrophy. Left ventricular diastolic function could not be evaluated due to mitral annular calcification (moderate or greater). Elevated left ventricular end-diastolic  pressure. The E/e' is 36.7. Right Ventricle: The right ventricular size is mildly enlarged. No increase in right ventricular wall thickness. Right ventricular systolic function is mildly reduced. There is mildly elevated  pulmonary artery systolic pressure. The tricuspid regurgitant  velocity is 2.82 m/s, and with an assumed right atrial pressure of 8 mmHg, the estimated right ventricular systolic pressure is 39.8 mmHg. Left Atrium: Left atrial size was mildly dilated. Right Atrium: Right atrial size was normal in size. Pericardium: There is no evidence of pericardial effusion. Mitral Valve: The mitral valve is degenerative in appearance. Moderate mitral annular calcification. Mild mitral valve regurgitation. MV peak gradient, 7.4 mmHg. The mean mitral valve gradient is 3.0 mmHg with average heart rate of 66 bpm. Tricuspid Valve: The tricuspid valve is normal in structure. Tricuspid valve regurgitation is mild. Aortic Valve: The aortic valve has an indeterminant number of cusps. Aortic valve regurgitation is not visualized. Pulmonic Valve: The pulmonic valve was normal in structure. Pulmonic valve regurgitation is trivial. Aorta: There is mild dilatation of the ascending aorta, measuring 39 mm. Venous: The inferior vena cava is dilated in size with greater than 50% respiratory variability, suggesting right atrial pressure of 8 mmHg. IAS/Shunts: The interatrial septum was not well visualized.  LEFT VENTRICLE PLAX 2D LVIDd:         4.40 cm   Diastology LVIDs:         2.90 cm   LV e' medial:    3.71 cm/s LV PW:         1.20 cm   LV E/e' medial:  36.7 LV IVS:        1.10 cm   LV e' lateral:   5.57 cm/s LVOT diam:     2.30 cm   LV E/e' lateral: 24.4 LV SV:         84 LV SV Index:   38 LVOT Area:     4.15 cm  RIGHT VENTRICLE RV S prime:     7.28 cm/s TAPSE (M-mode): 1.2 cm LEFT ATRIUM             Index        RIGHT ATRIUM           Index LA diam:        4.00 cm 1.80 cm/m   RA Area:     22.80 cm LA Vol (A2C):   78.9 ml 35.43 ml/m  RA Volume:   67.60 ml  30.35 ml/m LA Vol (A4C):   68.2 ml 30.62 ml/m LA Biplane Vol: 74.5 ml 33.45 ml/m  AORTIC VALVE LVOT Vmax:   91.70 cm/s LVOT Vmean:  66.400 cm/s LVOT VTI:    0.203 m  AORTA Ao Root  diam: 3.30 cm Ao Asc diam:  3.90 cm MITRAL VALVE                TRICUSPID VALVE MV Area (PHT): 3.08 cm     TR Peak grad:   31.8 mmHg MV Area VTI:   2.26 cm     TR Vmax:        282.00 cm/s MV Peak grad:  7.4 mmHg MV Mean grad:  3.0 mmHg     SHUNTS MV Vmax:       1.36 m/s     Systemic VTI:  0.20 m MV Vmean:      77.4 cm/s    Systemic Diam: 2.30 cm MV Decel Time: 246 msec MV E velocity: 136.00 cm/s MV A velocity: 32.70 cm/s MV  E/A ratio:  4.16 Kardie Tobb DO Electronically signed by Thomasene Ripple DO Signature Date/Time: 03/05/2024/1:43:32 PM    Final    CT Angio Chest PE W and/or Wo Contrast Result Date: 03/05/2024 CLINICAL DATA:  Evaluate for pulmonary embolism.  Positive D-dimer. EXAM: CT ANGIOGRAPHY CHEST WITH CONTRAST TECHNIQUE: Multidetector CT imaging of the chest was performed using the standard protocol during bolus administration of intravenous contrast. Multiplanar CT image reconstructions and MIPs were obtained to evaluate the vascular anatomy. RADIATION DOSE REDUCTION: This exam was performed according to the departmental dose-optimization program which includes automated exposure control, adjustment of the mA and/or kV according to patient size and/or use of iterative reconstruction technique. CONTRAST:  75mL OMNIPAQUE IOHEXOL 350 MG/ML SOLN COMPARISON:  Chest radiograph from 03/04/2024 FINDINGS: Cardiovascular: Satisfactory opacification of the pulmonary arteries to the segmental level. No evidence of pulmonary embolism. Heart size is upper limits of normal. Postoperative changes from CABG procedure. Aortic atherosclerosis. No significant pericardial effusion. Calcifications on the mitral valve. Mediastinum/Nodes: Thyroid gland, trachea, and esophagus appear normal. No supraclavicular, axillary, mediastinal or hilar adenopathy. Lungs/Pleura: Moderate left pleural effusion with compressive type atelectasis of the left lower lobe. There is subsegmental atelectasis within the lingula. No pneumothorax  identified. Pulmonary nodule calcified granuloma within the posteromedial right upper lobe. Right middle lobe nodule measures 4 mm, image 96/6. Upper Abdomen: No acute abnormality. Musculoskeletal: Postoperative changes from recent median sternotomy on 01/21/2024. Sternotomy defect remains distinct. No acute or suspicious osseous findings. Review of the MIP images confirms the above findings. IMPRESSION: 1. No evidence for acute pulmonary embolism. 2. Moderate left pleural effusion with compressive type atelectasis of the left lower lobe. 3. Postoperative changes from recent median sternotomy and CABG procedure. Sternotomy defect remains distinct. 4. 4 mm right middle lobe pulmonary nodule. If the patient is at high risk for bronchogenic carcinoma, follow-up chest CT at 1year is recommended. If the patient is at low risk, no follow-up is needed. This recommendation follows the consensus statement: Guidelines for Management of Small Pulmonary Nodules Detected on CT Scans: A Statement from the Fleischner Society as published in Radiology 2005; 237:395-400. 5.  Aortic Atherosclerosis (ICD10-I70.0). Electronically Signed   By: Signa Kell M.D.   On: 03/05/2024 05:34   DG Chest Portable 1 View Result Date: 03/05/2024 CLINICAL DATA:  Initial evaluation for acute dyspnea. EXAM: PORTABLE CHEST 1 VIEW COMPARISON:  Prior radiograph from 03/04/2024. FINDINGS: Median sternotomy wires underlying CABG markers and surgical clips. Left atrial appendage clip noted. Cardiomegaly, stable. Mediastinal silhouette within normal limits. Aortic atherosclerosis. Lungs normally inflated. Layering left pleural effusion with associated dense left basilar opacity, which could reflect atelectasis or infiltrate. Lungs are otherwise clear. No pulmonary edema. No pneumothorax. No acute osseous finding. IMPRESSION: Moderate left pleural effusion with associated dense left basilar opacity, which could reflect atelectasis or infiltrate,  relatively stable from prior. Electronically Signed   By: Rise Mu M.D.   On: 03/05/2024 00:51   DG Chest 2 View Result Date: 03/04/2024 CLINICAL DATA:  Shortness of breath.  Pleural effusion EXAM: CHEST - 2 VIEW COMPARISON:  X-ray 02/27/2024 and older FINDINGS: Postop chest. Atrial occlusion clip. Stable cardiopericardial silhouette calcified aorta. Increasing now moderate left effusion with adjacent lung opacity. No pneumothorax or edema. Dense left upper lobe lung nodule, likely calcified. Right lung is grossly clear. Degenerative changes of the spine. IMPRESSION: Postop chest.  Moderate left effusion. Electronically Signed   By: Karen Kays M.D.   On: 03/04/2024 12:16   EP STUDY  Result Date: 03/03/2024 See surgical note for result.  VAS [place] LOWER EXTREMITY VENOUS (DVT) Result Date: 02/29/2024  Lower Venous DVT Study Patient Name:  Darin Mcclure  Date of Exam:   02/27/2024 Medical Rec #: [id number]          Accession #:    [id number] Date of Birth: 1941-09-06         Patient Gender: M Patient Age:   31 years Exam Location:  [facility] Procedure:      VAS [place] LOWER EXTREMITY VENOUS (DVT) Referring Phys: HARRELL LIGHTFOOT --------------------------------------------------------------------------------  Indications: Edema, and s/p CABG with b/l GSV harvest 01/21/2024.  Limitations: Poor ultrasound/tissue interface. Comparison Study: No prior study Performing Technologist: Gertie Fey MHA, RDMS, RVT, RDCS  Examination Guidelines: A complete evaluation includes B-mode imaging, spectral Doppler, color Doppler, and power Doppler as needed of all accessible portions of each vessel. Bilateral testing is considered an integral part of a complete examination. Limited examinations for reoccurring indications may be performed as noted. The reflux portion of the exam is performed with the patient in reverse Trendelenburg.  +---------+---------------+---------+-----------+----------+--------------+ RIGHT     CompressibilityPhasicitySpontaneityPropertiesThrombus Aging +---------+---------------+---------+-----------+----------+--------------+ CFV      Full           Yes      Yes                                 +---------+---------------+---------+-----------+----------+--------------+ SFJ      Full                                                        +---------+---------------+---------+-----------+----------+--------------+ FV Prox  Full                                                        +---------+---------------+---------+-----------+----------+--------------+ FV Mid   Full                                                        +---------+---------------+---------+-----------+----------+--------------+ FV DistalFull                                                        +---------+---------------+---------+-----------+----------+--------------+ PFV      Full                                                        +---------+---------------+---------+-----------+----------+--------------+ POP      Full           Yes      Yes                                 +---------+---------------+---------+-----------+----------+--------------+  PTV      Full                                                        +---------+---------------+---------+-----------+----------+--------------+ PERO     Full                                                        +---------+---------------+---------+-----------+----------+--------------+   +---------+---------------+---------+-----------+----------+--------------+ LEFT     CompressibilityPhasicitySpontaneityPropertiesThrombus Aging +---------+---------------+---------+-----------+----------+--------------+ CFV      Full           Yes      Yes                                 +---------+---------------+---------+-----------+----------+--------------+ SFJ      Full                                                         +---------+---------------+---------+-----------+----------+--------------+ FV Prox  Full                                                        +---------+---------------+---------+-----------+----------+--------------+ FV Mid   Full                                                        +---------+---------------+---------+-----------+----------+--------------+ FV DistalFull                                                        +---------+---------------+---------+-----------+----------+--------------+ PFV      Full                                                        +---------+---------------+---------+-----------+----------+--------------+ POP      Full           Yes      Yes                                 +---------+---------------+---------+-----------+----------+--------------+ PTV      Full                                                        +---------+---------------+---------+-----------+----------+--------------+  PERO     Full                                                        +---------+---------------+---------+-----------+----------+--------------+     Summary: RIGHT: - There is no evidence of deep vein thrombosis in the lower extremity.  - No cystic structure found in the popliteal fossa. - Anechoic area of the right distal medial thigh measuring 4.6 x 2.2 cm, suggestive of seroma.  LEFT: - There is no evidence of deep vein thrombosis in the lower extremity.  - No cystic structure found in the popliteal fossa. - Anechoic area of the left distal medial thigh measuring 3.5 x 0.79 cm, suggestive of seroma.  *See table(s) above for measurements and observations. Electronically signed by Coral Else MD on 02/29/2024 at 2:12:42 PM.    Final    DG Chest 2 View Result Date: 02/27/2024 CLINICAL DATA:  Status post CABG EXAM: CHEST - 2 VIEW COMPARISON:  February 25, 2019 first FINDINGS: Persistent left lower lobe atelectasis.  Left  pleural reaction. Right lung clear.  No obvious infiltrates or consolidations Heart and mediastinum normal. Status post CABG and left atrial clipping. Electronically Signed   By: Shaaron Adler M.D.   On: 02/27/2024 14:46   DG Chest 1 View Result Date: 02/25/2024 CLINICAL DATA:  Status post thoracentesis. EXAM: CHEST  1 VIEW COMPARISON:  02/24/2024 FINDINGS: Interval decrease in left pleural effusion with persistent left base collapse/consolidation. No evidence for pneumothorax. Tiny left upper lobe pulmonary nodule is again noted. Right lung clear. The cardiopericardial silhouette is within normal limits for size. No acute bony abnormality. IMPRESSION: Interval decrease in left pleural effusion with persistent left base collapse/consolidation. No evidence for pneumothorax. Electronically Signed   By: Kennith Center M.D.   On: 02/25/2024 10:57   IR THORACENTESIS ASP PLEURAL SPACE W/IMG GUIDE Result Date: 02/25/2024 INDICATION: 83 year old male status post CABG, now with left pleural effusion, recurrent. Request received for therapeutic left thoracentesis. EXAM: ULTRASOUND GUIDED THERAPEUTIC THORACENTESIS MEDICATIONS: 8 cc of 1% lidocaine COMPLICATIONS: None immediate. PROCEDURE: An ultrasound guided thoracentesis was thoroughly discussed with the patient and questions answered. The benefits, risks, alternatives and complications were also discussed. The patient understands and wishes to proceed with the procedure. Written consent was obtained. Ultrasound was performed to localize and mark an adequate pocket of fluid in the left chest. The area was then prepped and draped in the normal sterile fashion. 1% Lidocaine was used for local anesthesia. Under ultrasound guidance a 6 Fr Safe-T-Centesis catheter was introduced. Thoracentesis was performed. The catheter was removed and a dressing applied. FINDINGS: A total of approximately 1.4 L of clear, dark amber pleural fluid was removed. IMPRESSION: Successful ultrasound  guided left thoracentesis yielding 1.4 L of pleural fluid. Procedure performed by Buzzy Han, PA-C Electronically Signed   By: Judie Petit.  Shick M.D.   On: 02/25/2024 09:45   DG Chest 2 View Result Date: 02/24/2024 CLINICAL DATA:  CABG. EXAM: CHEST - 2 VIEW COMPARISON:  Chest radiograph dated 02/14/2024. FINDINGS: No significant interval change in left pleural effusion and left lung base atelectasis or infiltrate. The right lung is clear. No pneumothorax. Stable cardiac silhouette. Median sternotomy wires. Atherosclerotic calcification of the aorta. No acute osseous pathology. IMPRESSION: No significant interval change in left pleural effusion and left lung base atelectasis  or infiltrate. Electronically Signed   By: Elgie Collard M.D.   On: 02/24/2024 12:47   DG Chest 2 View Result Date: 02/14/2024 CLINICAL DATA:  Shortness of breath and weakness. EXAM: CHEST - 2 VIEW COMPARISON:  One-view chest x-ray 02/07/2024. FINDINGS: Patient is status post median sternotomy. Atrial clipping and CABG noted. Atherosclerotic calcifications are present at the aortic arch. The left pleural effusion has decreased slightly. Mild dependent atelectasis is present. The lungs are otherwise clear. IMPRESSION: 1. Slight decrease in left pleural effusion. 2. Mild dependent atelectasis. Electronically Signed   By: Marin Roberts M.D.   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 Recurrent moderately large left pleural effusion. No evidence of pulmonary edema or pneumothorax. Atherosclerotic calcifications present in the transverse aorta. IMPRESSION: 1. Recurrent moderate left pleural effusion. 2. No evidence of pulmonary edema or pneumothorax. Electronically Signed   By: Malachy Moan M.D.   On:  02/07/2024 13:31   Disposition   Pt is being discharged home today in good condition.  Follow-up Plans & Appointments     Follow-up Information     Lightfoot, Eliezer Lofts, MD Follow up.   Specialty: Cardiothoracic Surgery Why: Please see discharge paperwork for follow-up details of appointment with Dr. Cliffton Asters. Contact information: [address] [place] [address] [phone]         [facility] AT [address] Follow up.   Specialty: Radiology Why: The date you are scheduled to see Dr. Cliffton Asters in the office obtain a chest x-ray at [facility] 1 hour prior to that appointment. Contact information: [address] [place] [address] [phone]        Azalee Course, [place] Follow up on 03/12/2024.   Specialties: Cardiology, Radiology Why: 9:15AM. Post hospital follow up. Need EKG prior to the visit. No need to fast. Contact information: [address] [place] [address] [phone]                   Discharge Medications   Allergies as of 03/06/2024   No Known Allergies      Medication List     STOP taking these medications    metoprolol succinate 50 MG 24 hr tablet Commonly known as: Toprol XL       TAKE these medications    acetaminophen 500 MG tablet Commonly known as: TYLENOL Take 1-2 tablets (500-1,000 mg total) by mouth every 6 (six) hours as needed.   amiodarone 200 MG tablet Commonly known as: PACERONE Take 1 tablet (200 mg total) by mouth daily.   amoxicillin 500 MG capsule Commonly known as: AMOXIL Take 2,000 mg by mouth See admin instructions. As needed for dental procedure   apixaban 5 MG Tabs tablet Commonly known as: ELIQUIS Take 1 tablet (5 mg total) by mouth 2 (two) times daily.   aspirin EC 81 MG tablet Take 81 mg by mouth daily.   atorvastatin 40 MG tablet Commonly known as: LIPITOR Take 1 tablet (40 mg total) by mouth at bedtime.    bumetanide 2 MG tablet Commonly known as: BUMEX Take 1 tablet (2 mg total) by mouth daily. Start taking on: March 07, 2024   famotidine 20 MG tablet Commonly known as: PEPCID Take 1 tablet (20  mg total) by mouth daily. What changed:  when to take this reasons to take this   FIBER CHOICE PO Take 2 tablets by mouth daily.   fluticasone 50 MCG/ACT nasal spray Commonly known as: FLONASE Place 1 spray into both nostrils daily as needed for allergies.   Januvia 100 MG tablet Generic drug: sitaGLIPtin Take 100 mg by mouth daily.   levothyroxine 75 MCG tablet Commonly known as: SYNTHROID Take 75 mcg by mouth daily before breakfast.   Magnesium 500 MG Caps Take 1,000 mg by mouth daily.   metFORMIN 1000 MG tablet Commonly known as: GLUCOPHAGE Take 1 tablet by mouth 2 (two) times daily.   methylPREDNISolone 4 MG Tbpk tablet Commonly known as: MEDROL DOSEPAK Take as directed until finished beginning the day of the left thoracentesis.   NONFORMULARY OR COMPOUNDED ITEM Topical antifungal topical solution: terbinafine 3%, fluconazole 2%, tea tree oil 5%, Urea 10%, ibuprofen2% in DMSO suspension. Sig: Apply to the affected toenail(s) once daily.  Order sent to [facility] [address] [place], [place] [address]   OMEGA-3 FISH OIL PO Take 4,000 mg by mouth at bedtime.   ONE TOUCH ULTRA TEST test strip Generic drug: glucose blood USE 1 TO 2 TIMES A DAY   PROBIOTIC DAILY PO Take 1 capsule by mouth daily.           Outstanding Labs/Studies   BMET on follow up CXR prior to the next visit  Duration of Discharge Encounter: APP Time: 20 minutes   Signed, Azalee Course, PA 03/06/2024, 3:14 PM

## 2024-03-09 ENCOUNTER — Telehealth: Payer: Self-pay | Admitting: Cardiology

## 2024-03-09 ENCOUNTER — Other Ambulatory Visit: Payer: Self-pay | Admitting: Physician Assistant

## 2024-03-09 ENCOUNTER — Ambulatory Visit: Admitting: *Deleted

## 2024-03-09 ENCOUNTER — Telehealth: Payer: Self-pay | Admitting: Physician Assistant

## 2024-03-09 DIAGNOSIS — Z4802 Encounter for removal of sutures: Secondary | ICD-10-CM

## 2024-03-09 MED ORDER — AMIODARONE HCL 200 MG PO TABS: 400.0000 mg | ORAL_TABLET | Freq: Two times a day (BID) | ORAL | 1 refills | Status: DC

## 2024-03-09 MED ORDER — METOPROLOL SUCCINATE ER 25 MG PO TB24: 25.0000 mg | ORAL_TABLET | Freq: Every day | ORAL | 1 refills | Status: DC

## 2024-03-09 NOTE — Telephone Encounter (Signed)
 Spoke with pt regarding his elevated heart rate. Pt stated his heart rate has been elevated since Sunday night. Pt reported that his heart rate has been in the 120s while at rest. Pt also reported having sob when moving around the house. Pt's pulse was 123 while speaking to on the phone. Pt is not experiencing any other symptoms. The information provided was taken to the DOD Micael Adas) who suggested the pt go to the ED. Pt was notified and agreeable. Pt verbalized understanding. All questions if any were answered.

## 2024-03-09 NOTE — Telephone Encounter (Signed)
 Mr. Schank contacted [facility] office earlier and stated he has had elevated heart rate. He contacted cardiology previously and was instructed to go to the ED. Of note, patient denies chest pain, dizziness, or shortness of breath with elevated heart rate. He is Darin Mcclure on physical exam. I discussed restarting Toprol XL (25 mg not 50 mg as previously taken) and increasing his Amiodarone to 400 mg bid. He has found a BP cuff. I asked him to take BP every morning (around the same time) and keep a record. His BP and heart rate in the office were 114/79 and 116. He was instructed to take 400 mg of Amiodarone tonight as well as Toprol XL 25 mg. I did tell him if these medications do not help decrease heart rate, heart rate increases, and or stays sustained above 110, he is to go to ER ASAP. He has an appointment to see cardiology on 04/17.

## 2024-03-09 NOTE — Telephone Encounter (Signed)
  STAT if HR is under 50 or over 120 (normal HR is 60-100 beats per minute)  What is your heart rate? 114 currently   Do you have a log of your heart rate readings (document readings)? 114-120 bpm   Do you have any other symptoms? No

## 2024-03-09 NOTE — Progress Notes (Signed)
 Patient contacted our office stating he was watching TV last night and decided to check his HR. States HR was 120. States he ambulated and noticed he was SOB. Denies SOB at rest. States this morning his HR was 115. Per patient, metoprolol was d/c'ed on Friday stating he was told his HR was good and no longer needed it. Patient arrived for BP check. BP discussed with PA on call D. Julaine Nutting. Metoprolol 25mg  reintroduced to patient's medication regimen. Patient was advised to seek care in the ED if HR continues to elevate. Please see PA's telephone encounter for further guidance discussed.

## 2024-03-12 ENCOUNTER — Ambulatory Visit: Attending: Physician Assistant | Admitting: Physician Assistant

## 2024-03-12 ENCOUNTER — Other Ambulatory Visit: Payer: Self-pay | Admitting: Physician Assistant

## 2024-03-12 ENCOUNTER — Ambulatory Visit
Admission: RE | Admit: 2024-03-12 | Discharge: 2024-03-12 | Disposition: A | Discharge status: Home / Self Care | Source: Ambulatory Visit | Attending: Thoracic Surgery (Cardiothoracic Vascular Surgery) | Admitting: Thoracic Surgery (Cardiothoracic Vascular Surgery)

## 2024-03-12 VITALS — BP 120/70 | HR 116 | Ht 72.0 in | Wt 214.0 lb

## 2024-03-12 DIAGNOSIS — I4892 Unspecified atrial flutter: Secondary | ICD-10-CM | POA: Diagnosis not present

## 2024-03-12 DIAGNOSIS — E785 Hyperlipidemia, unspecified: Secondary | ICD-10-CM

## 2024-03-12 DIAGNOSIS — J90 Pleural effusion, not elsewhere classified: Secondary | ICD-10-CM

## 2024-03-12 DIAGNOSIS — I2510 Atherosclerotic heart disease of native coronary artery without angina pectoris: Secondary | ICD-10-CM

## 2024-03-12 DIAGNOSIS — Z951 Presence of aortocoronary bypass graft: Secondary | ICD-10-CM

## 2024-03-12 DIAGNOSIS — Z01818 Encounter for other preprocedural examination: Secondary | ICD-10-CM

## 2024-03-12 DIAGNOSIS — I10 Essential (primary) hypertension: Secondary | ICD-10-CM

## 2024-03-12 DIAGNOSIS — I97641 Postprocedural seroma of a circulatory system organ or structure following cardiac bypass: Secondary | ICD-10-CM | POA: Diagnosis not present

## 2024-03-12 DIAGNOSIS — E119 Type 2 diabetes mellitus without complications: Secondary | ICD-10-CM

## 2024-03-12 LAB — BASIC METABOLIC PANEL WITH GFR
BUN/Creatinine Ratio: 17 (ref 10–24)
BUN: 30 mg/dL — ABNORMAL HIGH (ref 8–27)
CO2: 22 mmol/L (ref 20–29)
Calcium: 9.8 mg/dL (ref 8.6–10.2)
Chloride: 94 mmol/L — ABNORMAL LOW (ref 96–106)
Creatinine, Ser: 1.73 mg/dL — ABNORMAL HIGH (ref 0.76–1.27)
Glucose: 97 mg/dL (ref 70–99)
Potassium: 5 mmol/L (ref 3.5–5.2)
Sodium: 139 mmol/L (ref 134–144)
eGFR: 39 mL/min/{1.73_m2} — ABNORMAL LOW (ref >59)

## 2024-03-12 MED ORDER — METOPROLOL SUCCINATE ER 50 MG PO TB24: 50.0000 mg | ORAL_TABLET | Freq: Every day | ORAL | 3 refills | Status: DC

## 2024-03-12 MED ORDER — AMIODARONE HCL 200 MG PO TABS: 200.0000 mg | ORAL_TABLET | Freq: Every day | ORAL | 3 refills | Status: DC

## 2024-03-12 NOTE — Progress Notes (Signed)
 Cardiology Office Note:  .   Date:  03/12/2024  ID:  Darin Mcclure, DOB March 16, 1941, MRN [id number] PCP: [name], MD  [facility] Providers Cardiologist:  Dorothye Gathers, MD     History of Present Illness: Darin Mcclure   Darin Mcclure is a 83 y.o. male with past medical history of CAD s/p 4v CABG (01/21/2024), PAF s/p MAZE and LAA clip 01/21/2024 on Eliquis, hypertension, hyperlipidemia, DM2, hypothyroidism and GERD.  His hospital course after bypass surgery was largely uncomplicated, however after returning home, he became more short of breath with exertion.  He was found to have a left-sided pleural effusion which was drained through thoracentesis.  Patient went back into atrial fibrillation after his discharge from the hospital so he underwent elective DCCV on 03/03/2024, shortly after DCCV, he noticed his shortness of breath has worsened.  He called his surgeon's office and was readmitted to the hospital in April.  Chest x-ray showed a moderate left pleural effusion.  CT PE protocol was negative, it does show a moderate left pleural effusion with compressive type atelectasis of the left lower lobe, 4 mm right middle lobe pulmonary nodule.  Follow-up CT in 1 year was recommended.  His Eliquis was held during the hospitalization that he was given IV Lasix 80 mg daily.  He had a significant urine output.  Weight came down to 216.9 pounds.  Echocardiogram obtained on 03/05/2024 showed EF 60 to 65%, no regional wall motion abnormality, mildly enlarged RV with mildly reduced RVEF, mild MR, mildly dilated ascending aorta measurement of 39 mm.  CT surgery was consulted for recurrent pleural effusion.  Patient also had endovascular harvesting site seroma in the right leg that may require incision and drainage.  Ultimately it was decided for the patient to undergo repeat of thoracentesis in the hospital in the followed by Dr. Deloise Ferries as outpatient.  Interventional radiology service was consulted and the  patient eventually underwent left thoracentesis with a removal of 1.1 L of clear amber-colored pleural fluid.  I discharged the patient on Bumex 2 mg daily his torsemide was going to cost him $140.  He did not respond very well to Lasix.  Patient presents today for follow-up.  His chest x-ray this morning showed a small left pleural effusion, he has upcoming follow-up with Dr. Deloise Ferries.  Unfortunately in the past few days, he has went back into atrial flutter with RVR, and is currently involved in 2-1 atrial flutter.  I discussed his case with DOD Dr. Wilton Hasting, we will decrease his amiodarone to 200 mg daily as he likely has been fully loaded on amiodarone by this point.  We will increase his metoprolol succinate to 50 mg daily.  Patient will undergo outpatient DC cardioversion tomorrow.  He has been absolutely compliant with Eliquis recently.  He was in sinus rhythm a week ago during the hospitalization.  He went into atrial flutter while on the Eliquis.  The longer he stays in atrial flutter/atrial fibrillation with RVR, the more likely he will have reaccumulation of the left pleural effusion.  Therefore we attempted to arrange the earliest possible cardioversion which is tomorrow.  He does get short of breath while in atrial flutter as well.  I plan to see the patient back in 3 weeks.  He will get a base metabolic panel since he was discharged on Bumex to make sure his renal function is stable.  ROS:   He denies chest pain, palpitations, pnd, orthopnea, n, v, dizziness, syncope,  edema, weight gain, or early satiety. All other systems reviewed and are otherwise negative except as noted above.   He has been having some SOB for the past few days  Studies Reviewed: Darin Mcclure   EKG Interpretation Date/Time:  Thursday March 12 2024 09:29:48 EDT Ventricular Rate:  115 PR Interval:  88 QRS Duration:  156 QT Interval:  398 QTC Calculation: 550 R Axis:   -36  Text Interpretation: 2:1 atrial flutter with RVR Right  bundle branch block Confirmed by Ervin Heath [phone]) on 03/12/2024 11:24:37 PM    Cardiac Studies & Procedures   ______________________________________________________________________________________________ CARDIAC CATHETERIZATION  CARDIAC CATHETERIZATION 01/15/2024  Conclusion   Prox RCA-1 lesion is 80% stenosed.   Prox RCA-2 lesion is 30% stenosed.   Mid RCA lesion is 30% stenosed.   Mid Cx lesion is 90% stenosed.   2nd Mrg lesion is 90% stenosed.   Mid LAD lesion is 80% stenosed.   Dist LAD lesion is 70% stenosed.   2nd Diag lesion is 95% stenosed.  Severe mid LAD stenosis at the takeoff of a small caliber diagonal branch. The Diagonal branch has a severe stenosis The mid Circumflex has a severe stenosis involving the ostium of the large caliber obtuse marginal branch The large, dominant RCA is heavily calcified throughout the proximal and mid vessel. The proximal vessel has a heavily calcified severe stenosis. The stent in the mid vessel is patent with minimal restenosis. Normal LV filling pressure.  Recommendations: Will consult CT surgery for consideration of CABG which I feel is the best revascularization strategy in this very active, diabetic male. Continue ASA and statin. Resume IV heparin 2 hours post radial band removal.  Findings Coronary Findings Diagnostic  Dominance: Right  Left Anterior Descending Vessel is large. Mid LAD lesion is 80% stenosed. Dist LAD lesion is 70% stenosed.  Second Diagonal Branch 2nd Diag lesion is 95% stenosed.  Left Circumflex Vessel is large. Mid Cx lesion is 90% stenosed.  Second Obtuse Marginal Branch 2nd Mrg lesion is 90% stenosed.  Right Coronary Artery Vessel is large. Prox RCA-1 lesion is 80% stenosed. The lesion is calcified. Prox RCA-2 lesion is 30% stenosed. The lesion is calcified. Mid RCA lesion is 30% stenosed. The lesion was previously treated using a bare metal stent over 2 years ago. Previously placed stent displays  restenosis.  Intervention  No interventions have been documented.   STRESS TESTS  MYOCARDIAL PERFUSION IMAGING 01/12/2021  Narrative  The left ventricular ejection fraction is hyperdynamic (>65%).  Nuclear stress EF: 68%. No wall motion abnormality  There was no ST segment deviation noted during stress.  Defect 1: There is a small defect of mild severity present in the apex location. No ischemia identified.  This is a low risk study.  Dorothye Gathers, MD   ECHOCARDIOGRAM  ECHOCARDIOGRAM COMPLETE 03/11/2024  Narrative ECHOCARDIOGRAM REPORT    Patient Name:   Darin Mcclure Date of Exam: 03/05/2024 Medical Rec #:  [id number]         Height:       72.0 in Accession #:    [id number]        Weight:       222.0 lb Date of Birth:  1941/03/15        BSA:          2.227 m Patient Age:    82 years          BP:           131/68 mmHg Patient  Gender: M                 HR:           67 bpm. Exam Location:  Inpatient  Procedure: 2D Echo, Color Doppler and Cardiac Doppler (Both Spectral and Color Flow Doppler were utilized during procedure).  Indications:    I50.31 Acute diastolic (congestive) heart failure  History:        Patient has prior history of Echocardiogram examinations, most recent 01/21/2024. CHF, Prior CABG, Arrythmias:RBBB; Risk Factors:Dyslipidemia, Hypertension and Diabetes. 01/21/24 CABG, Maze, LAA Clip.  Sonographer:    Sherline Distel Senior RDCS Referring Phys: [id number] Christena Covert  IMPRESSIONS   1. Left ventricular ejection fraction, by estimation, is 60 to 65%. The left ventricle has normal function. The left ventricle has no regional wall motion abnormalities. There is mild concentric left ventricular hypertrophy. Elevated left ventricular end-diastolic pressure. 2. Right ventricular systolic function is mildly reduced. The right ventricular size is mildly enlarged. There is mildly elevated pulmonary artery systolic pressure. 3. Left atrial size was mildly  dilated. 4. The mitral valve is degenerative. Mild mitral valve regurgitation. The mean mitral valve gradient is 3.0 mmHg. Moderate mitral annular calcification. 5. The aortic valve has an indeterminant number of cusps. Aortic valve regurgitation is not visualized. 6. There is mild dilatation of the ascending aorta, measuring 39 mm. 7. The inferior vena cava is dilated in size with >50% respiratory variability, suggesting right atrial pressure of 8 mmHg.  FINDINGS Left Ventricle: Left ventricular ejection fraction, by estimation, is 60 to 65%. The left ventricle has normal function. The left ventricle has no regional wall motion abnormalities. The left ventricular internal cavity size was normal in size. There is mild concentric left ventricular hypertrophy. Left ventricular diastolic function could not be evaluated due to mitral annular calcification (moderate or greater). Elevated left ventricular end-diastolic pressure. The E/e' is 36.7.  Right Ventricle: The right ventricular size is mildly enlarged. No increase in right ventricular wall thickness. Right ventricular systolic function is mildly reduced. There is mildly elevated pulmonary artery systolic pressure. The tricuspid regurgitant velocity is 2.82 m/s, and with an assumed right atrial pressure of 8 mmHg, the estimated right ventricular systolic pressure is 39.8 mmHg.  Left Atrium: Left atrial size was mildly dilated.  Right Atrium: Right atrial size was normal in size.  Pericardium: There is no evidence of pericardial effusion.  Mitral Valve: The mitral valve is degenerative in appearance. Moderate mitral annular calcification. Mild mitral valve regurgitation. MV peak gradient, 7.4 mmHg. The mean mitral valve gradient is 3.0 mmHg with average heart rate of 66 bpm.  Tricuspid Valve: The tricuspid valve is normal in structure. Tricuspid valve regurgitation is mild.  Aortic Valve: The aortic valve has an indeterminant number of cusps.  Aortic valve regurgitation is not visualized.  Pulmonic Valve: The pulmonic valve was normal in structure. Pulmonic valve regurgitation is trivial.  Aorta: There is mild dilatation of the ascending aorta, measuring 39 mm.  Venous: The inferior vena cava is dilated in size with greater than 50% respiratory variability, suggesting right atrial pressure of 8 mmHg.  IAS/Shunts: The interatrial septum was not well visualized.   LEFT VENTRICLE PLAX 2D LVIDd:         4.40 cm   Diastology LVIDs:         2.90 cm   LV e' medial:    3.71 cm/s LV PW:         1.20 cm   LV E/e'  medial:  36.7 LV IVS:        1.10 cm   LV e' lateral:   5.57 cm/s LVOT diam:     2.30 cm   LV E/e' lateral: 24.4 LV SV:         84 LV SV Index:   38 LVOT Area:     4.15 cm   RIGHT VENTRICLE RV S prime:     7.28 cm/s TAPSE (M-mode): 1.2 cm  LEFT ATRIUM             Index        RIGHT ATRIUM           Index LA diam:        4.00 cm 1.80 cm/m   RA Area:     22.80 cm LA Vol (A2C):   78.9 ml 35.43 ml/m  RA Volume:   67.60 ml  30.35 ml/m LA Vol (A4C):   68.2 ml 30.62 ml/m LA Biplane Vol: 74.5 ml 33.45 ml/m AORTIC VALVE LVOT Vmax:   91.70 cm/s LVOT Vmean:  66.400 cm/s LVOT VTI:    0.203 m  AORTA Ao Root diam: 3.30 cm Ao Asc diam:  3.90 cm  MITRAL VALVE                TRICUSPID VALVE MV Area (PHT): 3.08 cm     TR Peak grad:   31.8 mmHg MV Area VTI:   2.26 cm     TR Vmax:        282.00 cm/s MV Peak grad:  7.4 mmHg MV Mean grad:  3.0 mmHg     SHUNTS MV Vmax:       1.36 m/s     Systemic VTI:  0.20 m MV Vmean:      77.4 cm/s    Systemic Diam: 2.30 cm MV Decel Time: 246 msec MV E velocity: 136.00 cm/s MV A velocity: 32.70 cm/s MV E/A ratio:  4.16  Kardie Tobb DO Electronically signed by Jerryl Morin DO Signature Date/Time: 03/05/2024/1:43:32 PM    Final   TEE  ECHO INTRAOPERATIVE TEE 01/21/2024  Narrative *INTRAOPERATIVE TRANSESOPHAGEAL REPORT *    Patient Name:   Darin Mcclure  Date of Exam:  01/21/2024 Medical Rec #:  [id number]      Height:       72.0 in Accession #:    [id number]     Weight:       224.6 lb Date of Birth:  April 27, 1941     BSA:          2.24 m Patient Age:    82 years       BP:           129/80 mmHg Patient Gender: M              HR:           100 bpm. Exam Location:  Anesthesiology  Transesophogeal exam was perform intraoperatively during surgical procedure. Patient was closely monitored under general anesthesia during the entirety of examination.  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 POST-OP IMPRESSIONS Limited post CPB exam: The patient separated easily from CPB.  _ Left Ventricle: The left ventricular function is normal, unchanged from pre-bypass images. The overall EF is 60-65%. _  Right Ventricle: The right ventricular function appears low normal, essentially unchanged from pre-bypass images. _ Aortic Valve: The aortic valve appears normal, unchanged from pre-bypass images. There is no insufficiency by color Doppler. _ Mitral Valve: The mitral valve function appears unchanged from pre-bypass images. 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 _ Tricuspid Valve: The tricuspid valve function appears essentially unchanged from pre-bypass. There was severe Tricuspid regurgitation on initial separation from CPB, improved to moderate with time off of CPB.  PRE-OP FINDINGS Left Ventricle: The left ventricle has normal systolic function, with an ejection fraction of 55-60%, estimated 60%. The cavity size was normal. No evidence of left ventricular regional wall motion abnormalities. There is no left ventricular hypertrophy. Left  ventricular diastolic function was not evaluated.  Right Ventricle: The right ventricle has normal systolic function. The cavity was normal. There is no increase in right ventricular wall thickness.  Left Atrium: Left atrial size was mildly dilated. No left atrial/left atrial appendage thrombus was detected. Left atrial appendage velocity is normal at greater than 40 cm/s.  Right Atrium: Right atrial size was dilated. Catheter present in the right atrium.  Interatrial Septum: No atrial level shunt detected by color flow Doppler. There is no evidence of a patent foramen ovale.  Pericardium: There is no evidence of pericardial effusion.  Mitral Valve: Thickened posterior leaflet. There is moderate to severe mitral annular calcification present. Mitral valve regurgitation is mild by color flow Doppler. The MR jet is centrally-directed. Pulmonary venous flow is normal. There is mild mitral stenosis, mean gradient 1 mmHg, peak gradient 4 mmHg. There is moderate thickening present on the mitral valve posterior cusp with severely decreased mobility.  Tricuspid Valve: The tricuspid valve was normal in structure. Tricuspid valve regurgitation is mild-moderate by color flow Doppler. The jet is directed toward the atrial septum. No evidence of tricuspid stenosis is present.  Aortic Valve: The aortic valve is tricuspid. Aortic valve regurgitation was not visualized by color flow Doppler. There is no stenosis of the aortic valve. There is no evidence of aortic valve vegetation.  Pulmonic Valve: The pulmonic valve was normal in structure, with normal leaflet mobility. No evidence of pulmonic stenosis. Pulmonic valve regurgitation is trivial by color flow Doppler.   Aorta: There is evidence of plaque in the descending aorta; Grade II, measuring 2-54mm in size. The ascending aorta is mildly calcified.  Pulmonary Artery: The pulmonary artery is of normal size.  Venous: The inferior vena cava is normal in  size with greater than 50% respiratory variability, suggesting right atrial pressure of 3 mmHg.  Shunts: There is no evidence of an atrial septal defect.  +-------------+--------++ AORTIC VALVE          +-------------+--------++ AV Mean Grad:1.0 mmHg +-------------+--------++  +-------------+--------++ MITRAL VALVE          +-------------+--------++ MV Mean grad:1.0 mmHg +-------------+--------++   Jonne Netters MD Electronically signed by Jonne Netters MD Signature Date/Time: 01/21/2024/1:43:00 PM    Final        ______________________________________________________________________________________________      Risk Assessment/Calculations:    CHA2DS2-VASc Score = 5   This indicates a 7.2% annual risk of stroke. The patient's score is based upon: CHF History: 0 HTN History: 1 Diabetes History: 1 Stroke History: 0 Vascular Disease History: 1 Age Score: 2 Gender Score: 0            Physical Exam:   VS:  BP 120/70 (BP Location: Left Arm, Patient Position: Sitting, Cuff Size: Large)  Pulse (!) 116   Ht 6' (1.829 m)   Wt 214 lb (97.1 kg)   SpO2 96%   BMI 29.02 kg/m    Wt Readings from Last 3 Encounters:  03/12/24 214 lb (97.1 kg)  03/06/24 216 lb 14.9 oz (98.4 kg)  03/03/24 222 lb (100.7 kg)    GEN: Well nourished, well developed in no acute distress NECK: No JVD; No carotid bruits CARDIAC: RRR, no murmurs, rubs, gallops RESPIRATORY:  Clear to auscultation without rales, wheezing or rhonchi  ABDOMEN: Soft, non-tender, non-distended EXTREMITIES:  No edema; No deformity   ASSESSMENT AND PLAN: .    Atrial Flutter with Rapid Ventricular Response (RVR) Recurrent atrial flutter with RVR, currently in 2:1 atrial flutter. Heart rate around 115 bpm. Recent cardioversion reverted to atrial flutter. On amiodarone and metoprolol. Prefers outpatient management. Compliant with Eliquis, allowing cardioversion without transesophageal echo.  -  Decrease amiodarone to 200 mg daily. - Increase metoprolol succinate to 50 mg daily. - Scheduled outpatient DC cardioversion for March 13, 2024, at 12:30 PM.  Left Pleural Effusion Recurrent left pleural effusion, likely secondary to atrial flutter with RVR. Not causing significant symptoms. Cardioversion expected to help prevent reaccumulation. - Monitor pleural effusion status. - Follow up with Dr. Deloise Ferries for potential Plurax catheter placement if effusion reaccumulates.  Endovascular Harvesting Site Seroma Endovascular harvesting site seroma in the right leg, may require incision and drainage. - Follow up with Dr. Deloise Ferries for potential incision and drainage of the seroma.  CAD s/p CABG: denies any chest pain. ASA and lipitor  Hypertension: blood pressure fairly controlled, increase metoprolol for better rate control  Hyperlipidemia: On atorvastatin  DM2: Managed by primary care provider.  Hold the Januvia and metformin prior to cardioversion    Cardiac Rehabilitation Eligibility Assessment  The patient is NOT ready to start cardiac rehabilitation due to: Other    Informed Consent   Shared Decision Making/Informed Consent The risks (stroke, cardiac arrhythmias rarely resulting in the need for a temporary or permanent pacemaker, skin irritation or burns and complications associated with conscious sedation including aspiration, arrhythmia, respiratory failure and death), benefits (restoration of normal sinus rhythm) and alternatives of a direct current cardioversion were explained in detail to Mr. Mccardle and he agrees to proceed.       Dispo: Follow up in 2 to 3 weeks  Signed, Jrake Rodriquez, PA

## 2024-03-12 NOTE — Patient Instructions (Signed)
 Medication Instructions:  DECREASE AMIODARONE TO 200 MG DAILY  INCREASE METOPROLOL SUCCINATE TO 50 MG DAILY *If you need a refill on your cardiac medications before your next appointment, please call your pharmacy*  Lab Work: BMP TODAY If you have labs (blood work) drawn today and your tests are completely normal, you will receive your results only by: MyChart Message (if you have MyChart) OR A paper copy in the mail If you have any lab test that is abnormal or we need to change your treatment, we will call you to review the results.  Testing/Procedures:     Dear Darin Mcclure  You are scheduled for a Cardioversion on Friday, April 18 with Dr. Jacques Navy.  Please arrive at the [facility] (Main Entrance A) at [facility]: [address] [place], [place] [address] at 10:30 AM (This time is 2 hour(s) before your procedure to ensure your preparation).   Free valet parking service is available. You will check in at ADMITTING.   *Please Note: You will receive a call the day before your procedure to confirm the appointment time. That time may have changed from the original time based on the schedule for that day.*   DIET:  Nothing to eat or drink after midnight except a sip of water with medications (see medication instructions below)  MEDICATION INSTRUCTIONS: !!IF ANY NEW MEDICATIONS ARE STARTED AFTER TODAY, PLEASE NOTIFY YOUR PROVIDER AS SOON AS POSSIBLE!!  FYI: Medications such as Semaglutide (Ozempic, [place]), Tirzepatide (Mounjaro, Zepbound), Dulaglutide (Trulicity), etc ("GLP1 agonists") AND Canagliflozin (Invokana), Dapagliflozin (Farxiga), Empagliflozin (Jardiance), Ertugliflozin (Steglatro), Bexagliflozin [employer]) or any combination with one of these drugs such as Invokamet (Canagliflozin/Metformin), Synjardy (Empagliflozin/Metformin), etc ("SGLT2 inhibitors") must be held around the time of a procedure. This is not a comprehensive list of all of these drugs. Please review  all of your medications and talk to your provider if you take any one of these. If you are not sure, ask your provider.   MORNING OF PROCEDURE : HOLD METFORMIN HOLD JANUVIA HOLD BUMEX        :1}Continue taking your anticoagulant (blood thinner): Apixaban (Eliquis).  You will need to continue this after your procedure until you are told by your provider that it is safe to stop.    LABS: DRAWN TODAY 03/12/24  FYI:  For your safety, and to allow [place] to monitor your vital signs accurately during the surgery/procedure we request: If you have artificial nails, gel coating, SNS etc, please have those removed prior to your surgery/procedure. Not having the nail coverings /polish removed may result in cancellation or delay of your surgery/procedure.  Your support person will be asked to wait in the waiting room during your procedure.  It is OK to have someone drop you off and come back when you are ready to be discharged.  You cannot drive after the procedure and will need someone to drive you home.  Bring your insurance cards.  *Special Note: Every effort is made to have your procedure done on time. Occasionally there are emergencies that occur at the hospital that may cause delays. Please be patient if a delay does occur.      Follow-Up: At [facility], you and your health needs are our priority.  As part of our continuing mission to provide you with exceptional heart care, our providers are all part of one team.  This team includes your primary Cardiologist (physician) and Advanced Practice Providers or APPs (Physician Assistants and Nurse Practitioners) who  all work together to provide you with the care you need, when you need it.  Your next appointment:   2-3 week(s)  Provider:   Ervin Heath, PA  We recommend signing up for the patient portal called "MyChart".  Sign up information is provided on this After Visit Summary.  MyChart is used to connect with patients for Virtual Visits  (Telemedicine).  Patients are able to view lab/test results, encounter notes, upcoming appointments, etc.  Non-urgent messages can be sent to your provider as well.   To learn more about what you can do with MyChart, go to [email or web address].   Other Instructions   1st Floor: - Lobby - Registration  - Pharmacy  - Lab - Cafe  2nd Floor: - PV Lab - Diagnostic Testing (echo, CT, nuclear med)  3rd Floor: - Vacant  4th Floor: - TCTS (cardiothoracic surgery) - AFib Clinic - Structural Heart Clinic - Vascular Surgery  - Vascular Ultrasound  5th Floor: - HeartCare Cardiology (general and EP) - Clinical Pharmacy for coumadin, hypertension, lipid, weight-loss medications, and med management appointments    Valet parking services will be available as well.  \

## 2024-03-12 NOTE — Progress Notes (Signed)
 Spoke to patient and instructed them to come at 10:30  and to be NPO after 0000. Medications reviewed.   Confirmed that patient will have a ride home and someone to stay with them for 24 hours after the procedure.

## 2024-03-12 NOTE — H&P (View-Only) (Signed)
 Cardiology Office Note:  .   Date:  03/12/2024  ID:  [name], DOB March 16, 1941, MRN [id number] PCP: [name], MD  [facility] Providers Cardiologist:  Dorothye Gathers, MD     History of Present Illness: Aaron Aas   Marc Leichter is a 83 y.o. male with past medical history of CAD s/p 4v CABG (01/21/2024), PAF s/p MAZE and LAA clip 01/21/2024 on Eliquis, hypertension, hyperlipidemia, DM2, hypothyroidism and GERD.  His hospital course after bypass surgery was largely uncomplicated, however after returning home, he became more short of breath with exertion.  He was found to have a left-sided pleural effusion which was drained through thoracentesis.  Patient went back into atrial fibrillation after his discharge from the hospital so he underwent elective DCCV on 03/03/2024, shortly after DCCV, he noticed his shortness of breath has worsened.  He called his surgeon's office and was readmitted to the hospital in April.  Chest x-ray showed a moderate left pleural effusion.  CT PE protocol was negative, it does show a moderate left pleural effusion with compressive type atelectasis of the left lower lobe, 4 mm right middle lobe pulmonary nodule.  Follow-up CT in 1 year was recommended.  His Eliquis was held during the hospitalization that he was given IV Lasix 80 mg daily.  He had a significant urine output.  Weight came down to 216.9 pounds.  Echocardiogram obtained on 03/05/2024 showed EF 60 to 65%, no regional wall motion abnormality, mildly enlarged RV with mildly reduced RVEF, mild MR, mildly dilated ascending aorta measurement of 39 mm.  CT surgery was consulted for recurrent pleural effusion.  Patient also had endovascular harvesting site seroma in the right leg that may require incision and drainage.  Ultimately it was decided for the patient to undergo repeat of thoracentesis in the hospital in the followed by Dr. Deloise Ferries as outpatient.  Interventional radiology service was consulted and the  patient eventually underwent left thoracentesis with a removal of 1.1 L of clear amber-colored pleural fluid.  I discharged the patient on Bumex 2 mg daily his torsemide was going to cost him $140.  He did not respond very well to Lasix.  Patient presents today for follow-up.  His chest x-ray this morning showed a small left pleural effusion, he has upcoming follow-up with Dr. Deloise Ferries.  Unfortunately in the past few days, he has went back into atrial flutter with RVR, and is currently involved in 2-1 atrial flutter.  I discussed his case with DOD Dr. Wilton Hasting, we will decrease his amiodarone to 200 mg daily as he likely has been fully loaded on amiodarone by this point.  We will increase his metoprolol succinate to 50 mg daily.  Patient will undergo outpatient DC cardioversion tomorrow.  He has been absolutely compliant with Eliquis recently.  He was in sinus rhythm a week ago during the hospitalization.  He went into atrial flutter while on the Eliquis.  The longer he stays in atrial flutter/atrial fibrillation with RVR, the more likely he will have reaccumulation of the left pleural effusion.  Therefore we attempted to arrange the earliest possible cardioversion which is tomorrow.  He does get short of breath while in atrial flutter as well.  I plan to see the patient back in 3 weeks.  He will get a base metabolic panel since he was discharged on Bumex to make sure his renal function is stable.  ROS:   He denies chest pain, palpitations, pnd, orthopnea, n, v, dizziness, syncope,  edema, weight gain, or early satiety. All other systems reviewed and are otherwise negative except as noted above.   He has been having some SOB for the past few days  Studies Reviewed: Aaron Aas   EKG Interpretation Date/Time:  Thursday March 12 2024 09:29:48 EDT Ventricular Rate:  115 PR Interval:  88 QRS Duration:  156 QT Interval:  398 QTC Calculation: 550 R Axis:   -36  Text Interpretation: 2:1 atrial flutter with RVR Right  bundle branch block Confirmed by Ervin Heath [phone]) on 03/12/2024 11:24:37 PM    Cardiac Studies & Procedures   ______________________________________________________________________________________________ CARDIAC CATHETERIZATION  CARDIAC CATHETERIZATION 01/15/2024  Conclusion   Prox RCA-1 lesion is 80% stenosed.   Prox RCA-2 lesion is 30% stenosed.   Mid RCA lesion is 30% stenosed.   Mid Cx lesion is 90% stenosed.   2nd Mrg lesion is 90% stenosed.   Mid LAD lesion is 80% stenosed.   Dist LAD lesion is 70% stenosed.   2nd Diag lesion is 95% stenosed.  Severe mid LAD stenosis at the takeoff of a small caliber diagonal branch. The Diagonal branch has a severe stenosis The mid Circumflex has a severe stenosis involving the ostium of the large caliber obtuse marginal branch The large, dominant RCA is heavily calcified throughout the proximal and mid vessel. The proximal vessel has a heavily calcified severe stenosis. The stent in the mid vessel is patent with minimal restenosis. Normal LV filling pressure.  Recommendations: Will consult CT surgery for consideration of CABG which I feel is the best revascularization strategy in this very active, diabetic male. Continue ASA and statin. Resume IV heparin 2 hours post radial band removal.  Findings Coronary Findings Diagnostic  Dominance: Right  Left Anterior Descending Vessel is large. Mid LAD lesion is 80% stenosed. Dist LAD lesion is 70% stenosed.  Second Diagonal Branch 2nd Diag lesion is 95% stenosed.  Left Circumflex Vessel is large. Mid Cx lesion is 90% stenosed.  Second Obtuse Marginal Branch 2nd Mrg lesion is 90% stenosed.  Right Coronary Artery Vessel is large. Prox RCA-1 lesion is 80% stenosed. The lesion is calcified. Prox RCA-2 lesion is 30% stenosed. The lesion is calcified. Mid RCA lesion is 30% stenosed. The lesion was previously treated using a bare metal stent over 2 years ago. Previously placed stent displays  restenosis.  Intervention  No interventions have been documented.   STRESS TESTS  MYOCARDIAL PERFUSION IMAGING 01/12/2021  Narrative  The left ventricular ejection fraction is hyperdynamic (>65%).  Nuclear stress EF: 68%. No wall motion abnormality  There was no ST segment deviation noted during stress.  Defect 1: There is a small defect of mild severity present in the apex location. No ischemia identified.  This is a low risk study.  Dorothye Gathers, MD   ECHOCARDIOGRAM  ECHOCARDIOGRAM COMPLETE 03/11/2024  Narrative ECHOCARDIOGRAM REPORT    Patient Name:   GERSHOM BROBECK Date of Exam: 03/05/2024 Medical Rec #:  [id number]         Height:       72.0 in Accession #:    [id number]        Weight:       222.0 lb Date of Birth:  1941/03/15        BSA:          2.227 m Patient Age:    82 years          BP:           131/68 mmHg Patient  Gender: M                 HR:           67 bpm. Exam Location:  Inpatient  Procedure: 2D Echo, Color Doppler and Cardiac Doppler (Both Spectral and Color Flow Doppler were utilized during procedure).  Indications:    I50.31 Acute diastolic (congestive) heart failure  History:        Patient has prior history of Echocardiogram examinations, most recent 01/21/2024. CHF, Prior CABG, Arrythmias:RBBB; Risk Factors:Dyslipidemia, Hypertension and Diabetes. 01/21/24 CABG, Maze, LAA Clip.  Sonographer:    Sherline Distel Senior RDCS Referring Phys: [id number] Christena Covert  IMPRESSIONS   1. Left ventricular ejection fraction, by estimation, is 60 to 65%. The left ventricle has normal function. The left ventricle has no regional wall motion abnormalities. There is mild concentric left ventricular hypertrophy. Elevated left ventricular end-diastolic pressure. 2. Right ventricular systolic function is mildly reduced. The right ventricular size is mildly enlarged. There is mildly elevated pulmonary artery systolic pressure. 3. Left atrial size was mildly  dilated. 4. The mitral valve is degenerative. Mild mitral valve regurgitation. The mean mitral valve gradient is 3.0 mmHg. Moderate mitral annular calcification. 5. The aortic valve has an indeterminant number of cusps. Aortic valve regurgitation is not visualized. 6. There is mild dilatation of the ascending aorta, measuring 39 mm. 7. The inferior vena cava is dilated in size with >50% respiratory variability, suggesting right atrial pressure of 8 mmHg.  FINDINGS Left Ventricle: Left ventricular ejection fraction, by estimation, is 60 to 65%. The left ventricle has normal function. The left ventricle has no regional wall motion abnormalities. The left ventricular internal cavity size was normal in size. There is mild concentric left ventricular hypertrophy. Left ventricular diastolic function could not be evaluated due to mitral annular calcification (moderate or greater). Elevated left ventricular end-diastolic pressure. The E/e' is 36.7.  Right Ventricle: The right ventricular size is mildly enlarged. No increase in right ventricular wall thickness. Right ventricular systolic function is mildly reduced. There is mildly elevated pulmonary artery systolic pressure. The tricuspid regurgitant velocity is 2.82 m/s, and with an assumed right atrial pressure of 8 mmHg, the estimated right ventricular systolic pressure is 39.8 mmHg.  Left Atrium: Left atrial size was mildly dilated.  Right Atrium: Right atrial size was normal in size.  Pericardium: There is no evidence of pericardial effusion.  Mitral Valve: The mitral valve is degenerative in appearance. Moderate mitral annular calcification. Mild mitral valve regurgitation. MV peak gradient, 7.4 mmHg. The mean mitral valve gradient is 3.0 mmHg with average heart rate of 66 bpm.  Tricuspid Valve: The tricuspid valve is normal in structure. Tricuspid valve regurgitation is mild.  Aortic Valve: The aortic valve has an indeterminant number of cusps.  Aortic valve regurgitation is not visualized.  Pulmonic Valve: The pulmonic valve was normal in structure. Pulmonic valve regurgitation is trivial.  Aorta: There is mild dilatation of the ascending aorta, measuring 39 mm.  Venous: The inferior vena cava is dilated in size with greater than 50% respiratory variability, suggesting right atrial pressure of 8 mmHg.  IAS/Shunts: The interatrial septum was not well visualized.   LEFT VENTRICLE PLAX 2D LVIDd:         4.40 cm   Diastology LVIDs:         2.90 cm   LV e' medial:    3.71 cm/s LV PW:         1.20 cm   LV E/e'  medial:  36.7 LV IVS:        1.10 cm   LV e' lateral:   5.57 cm/s LVOT diam:     2.30 cm   LV E/e' lateral: 24.4 LV SV:         84 LV SV Index:   38 LVOT Area:     4.15 cm   RIGHT VENTRICLE RV S prime:     7.28 cm/s TAPSE (M-mode): 1.2 cm  LEFT ATRIUM             Index        RIGHT ATRIUM           Index LA diam:        4.00 cm 1.80 cm/m   RA Area:     22.80 cm LA Vol (A2C):   78.9 ml 35.43 ml/m  RA Volume:   67.60 ml  30.35 ml/m LA Vol (A4C):   68.2 ml 30.62 ml/m LA Biplane Vol: 74.5 ml 33.45 ml/m AORTIC VALVE LVOT Vmax:   91.70 cm/s LVOT Vmean:  66.400 cm/s LVOT VTI:    0.203 m  AORTA Ao Root diam: 3.30 cm Ao Asc diam:  3.90 cm  MITRAL VALVE                TRICUSPID VALVE MV Area (PHT): 3.08 cm     TR Peak grad:   31.8 mmHg MV Area VTI:   2.26 cm     TR Vmax:        282.00 cm/s MV Peak grad:  7.4 mmHg MV Mean grad:  3.0 mmHg     SHUNTS MV Vmax:       1.36 m/s     Systemic VTI:  0.20 m MV Vmean:      77.4 cm/s    Systemic Diam: 2.30 cm MV Decel Time: 246 msec MV E velocity: 136.00 cm/s MV A velocity: 32.70 cm/s MV E/A ratio:  4.16  Kardie Tobb DO Electronically signed by Jerryl Morin DO Signature Date/Time: 03/05/2024/1:43:32 PM    Final   TEE  ECHO INTRAOPERATIVE TEE 01/21/2024  Narrative *INTRAOPERATIVE TRANSESOPHAGEAL REPORT *    Patient Name:   MARTINO TOMPSON  Date of Exam:  01/21/2024 Medical Rec #:  [id number]      Height:       72.0 in Accession #:    [id number]     Weight:       224.6 lb Date of Birth:  April 27, 1941     BSA:          2.24 m Patient Age:    82 years       BP:           129/80 mmHg Patient Gender: M              HR:           100 bpm. Exam Location:  Anesthesiology  Transesophogeal exam was perform intraoperatively during surgical procedure. Patient was closely monitored under general anesthesia during the entirety of examination.  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 POST-OP IMPRESSIONS Limited post CPB exam: The patient separated easily from CPB.  _ Left Ventricle: The left ventricular function is normal, unchanged from pre-bypass images. The overall EF is 60-65%. _  Right Ventricle: The right ventricular function appears low normal, essentially unchanged from pre-bypass images. _ Aortic Valve: The aortic valve appears normal, unchanged from pre-bypass images. There is no insufficiency by color Doppler. _ Mitral Valve: The mitral valve function appears unchanged from pre-bypass images. 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 _ Tricuspid Valve: The tricuspid valve function appears essentially unchanged from pre-bypass. There was severe Tricuspid regurgitation on initial separation from CPB, improved to moderate with time off of CPB.  PRE-OP FINDINGS Left Ventricle: The left ventricle has normal systolic function, with an ejection fraction of 55-60%, estimated 60%. The cavity size was normal. No evidence of left ventricular regional wall motion abnormalities. There is no left ventricular hypertrophy. Left  ventricular diastolic function was not evaluated.  Right Ventricle: The right ventricle has normal systolic function. The cavity was normal. There is no increase in right ventricular wall thickness.  Left Atrium: Left atrial size was mildly dilated. No left atrial/left atrial appendage thrombus was detected. Left atrial appendage velocity is normal at greater than 40 cm/s.  Right Atrium: Right atrial size was dilated. Catheter present in the right atrium.  Interatrial Septum: No atrial level shunt detected by color flow Doppler. There is no evidence of a patent foramen ovale.  Pericardium: There is no evidence of pericardial effusion.  Mitral Valve: Thickened posterior leaflet. There is moderate to severe mitral annular calcification present. Mitral valve regurgitation is mild by color flow Doppler. The MR jet is centrally-directed. Pulmonary venous flow is normal. There is mild mitral stenosis, mean gradient 1 mmHg, peak gradient 4 mmHg. There is moderate thickening present on the mitral valve posterior cusp with severely decreased mobility.  Tricuspid Valve: The tricuspid valve was normal in structure. Tricuspid valve regurgitation is mild-moderate by color flow Doppler. The jet is directed toward the atrial septum. No evidence of tricuspid stenosis is present.  Aortic Valve: The aortic valve is tricuspid. Aortic valve regurgitation was not visualized by color flow Doppler. There is no stenosis of the aortic valve. There is no evidence of aortic valve vegetation.  Pulmonic Valve: The pulmonic valve was normal in structure, with normal leaflet mobility. No evidence of pulmonic stenosis. Pulmonic valve regurgitation is trivial by color flow Doppler.   Aorta: There is evidence of plaque in the descending aorta; Grade II, measuring 2-54mm in size. The ascending aorta is mildly calcified.  Pulmonary Artery: The pulmonary artery is of normal size.  Venous: The inferior vena cava is normal in  size with greater than 50% respiratory variability, suggesting right atrial pressure of 3 mmHg.  Shunts: There is no evidence of an atrial septal defect.  +-------------+--------++ AORTIC VALVE          +-------------+--------++ AV Mean Grad:1.0 mmHg +-------------+--------++  +-------------+--------++ MITRAL VALVE          +-------------+--------++ MV Mean grad:1.0 mmHg +-------------+--------++   Jonne Netters MD Electronically signed by Jonne Netters MD Signature Date/Time: 01/21/2024/1:43:00 PM    Final        ______________________________________________________________________________________________      Risk Assessment/Calculations:    CHA2DS2-VASc Score = 5   This indicates a 7.2% annual risk of stroke. The patient's score is based upon: CHF History: 0 HTN History: 1 Diabetes History: 1 Stroke History: 0 Vascular Disease History: 1 Age Score: 2 Gender Score: 0            Physical Exam:   VS:  BP 120/70 (BP Location: Left Arm, Patient Position: Sitting, Cuff Size: Large)  Pulse (!) 116   Ht 6' (1.829 m)   Wt 214 lb (97.1 kg)   SpO2 96%   BMI 29.02 kg/m    Wt Readings from Last 3 Encounters:  03/12/24 214 lb (97.1 kg)  03/06/24 216 lb 14.9 oz (98.4 kg)  03/03/24 222 lb (100.7 kg)    GEN: Well nourished, well developed in no acute distress NECK: No JVD; No carotid bruits CARDIAC: RRR, no murmurs, rubs, gallops RESPIRATORY:  Clear to auscultation without rales, wheezing or rhonchi  ABDOMEN: Soft, non-tender, non-distended EXTREMITIES:  No edema; No deformity   ASSESSMENT AND PLAN: .    Atrial Flutter with Rapid Ventricular Response (RVR) Recurrent atrial flutter with RVR, currently in 2:1 atrial flutter. Heart rate around 115 bpm. Recent cardioversion reverted to atrial flutter. On amiodarone and metoprolol. Prefers outpatient management. Compliant with Eliquis, allowing cardioversion without transesophageal echo.  -  Decrease amiodarone to 200 mg daily. - Increase metoprolol succinate to 50 mg daily. - Scheduled outpatient DC cardioversion for March 13, 2024, at 12:30 PM.  Left Pleural Effusion Recurrent left pleural effusion, likely secondary to atrial flutter with RVR. Not causing significant symptoms. Cardioversion expected to help prevent reaccumulation. - Monitor pleural effusion status. - Follow up with Dr. Deloise Ferries for potential Plurax catheter placement if effusion reaccumulates.  Endovascular Harvesting Site Seroma Endovascular harvesting site seroma in the right leg, may require incision and drainage. - Follow up with Dr. Deloise Ferries for potential incision and drainage of the seroma.  CAD s/p CABG: denies any chest pain. ASA and lipitor  Hypertension: blood pressure fairly controlled, increase metoprolol for better rate control  Hyperlipidemia: On atorvastatin  DM2: Managed by primary care provider.  Hold the Januvia and metformin prior to cardioversion    Cardiac Rehabilitation Eligibility Assessment  The patient is NOT ready to start cardiac rehabilitation due to: Other    Informed Consent   Shared Decision Making/Informed Consent The risks (stroke, cardiac arrhythmias rarely resulting in the need for a temporary or permanent pacemaker, skin irritation or burns and complications associated with conscious sedation including aspiration, arrhythmia, respiratory failure and death), benefits (restoration of normal sinus rhythm) and alternatives of a direct current cardioversion were explained in detail to Mr. Mccardle and he agrees to proceed.       Dispo: Follow up in 2 to 3 weeks  Signed, Jrake Rodriquez, PA

## 2024-03-13 ENCOUNTER — Other Ambulatory Visit: Payer: Self-pay

## 2024-03-13 ENCOUNTER — Ambulatory Visit (HOSPITAL_COMMUNITY): Admitting: Anesthesiology

## 2024-03-13 ENCOUNTER — Telehealth: Payer: Self-pay

## 2024-03-13 ENCOUNTER — Encounter (HOSPITAL_COMMUNITY)
Admission: RE | Disposition: A | Discharge status: Home / Self Care | Payer: Self-pay | Source: Home / Self Care | Attending: Internal Medicine

## 2024-03-13 ENCOUNTER — Ambulatory Visit (HOSPITAL_COMMUNITY)
Admission: RE | Admit: 2024-03-13 | Discharge: 2024-03-13 | Disposition: A | Discharge status: Home / Self Care | Attending: Internal Medicine | Admitting: Internal Medicine

## 2024-03-13 DIAGNOSIS — I483 Typical atrial flutter: Secondary | ICD-10-CM | POA: Diagnosis present

## 2024-03-13 DIAGNOSIS — Z951 Presence of aortocoronary bypass graft: Secondary | ICD-10-CM | POA: Diagnosis not present

## 2024-03-13 DIAGNOSIS — I110 Hypertensive heart disease with heart failure: Secondary | ICD-10-CM | POA: Diagnosis not present

## 2024-03-13 DIAGNOSIS — E785 Hyperlipidemia, unspecified: Secondary | ICD-10-CM | POA: Diagnosis not present

## 2024-03-13 DIAGNOSIS — K219 Gastro-esophageal reflux disease without esophagitis: Secondary | ICD-10-CM | POA: Insufficient documentation

## 2024-03-13 DIAGNOSIS — I4892 Unspecified atrial flutter: Secondary | ICD-10-CM | POA: Diagnosis not present

## 2024-03-13 DIAGNOSIS — J90 Pleural effusion, not elsewhere classified: Secondary | ICD-10-CM | POA: Insufficient documentation

## 2024-03-13 DIAGNOSIS — E039 Hypothyroidism, unspecified: Secondary | ICD-10-CM | POA: Insufficient documentation

## 2024-03-13 DIAGNOSIS — E119 Type 2 diabetes mellitus without complications: Secondary | ICD-10-CM | POA: Insufficient documentation

## 2024-03-13 DIAGNOSIS — I509 Heart failure, unspecified: Secondary | ICD-10-CM | POA: Insufficient documentation

## 2024-03-13 DIAGNOSIS — I2510 Atherosclerotic heart disease of native coronary artery without angina pectoris: Secondary | ICD-10-CM | POA: Insufficient documentation

## 2024-03-13 DIAGNOSIS — Z7984 Long term (current) use of oral hypoglycemic drugs: Secondary | ICD-10-CM | POA: Diagnosis not present

## 2024-03-13 DIAGNOSIS — Z7982 Long term (current) use of aspirin: Secondary | ICD-10-CM | POA: Insufficient documentation

## 2024-03-13 DIAGNOSIS — Z79899 Other long term (current) drug therapy: Secondary | ICD-10-CM | POA: Insufficient documentation

## 2024-03-13 DIAGNOSIS — I4891 Unspecified atrial fibrillation: Secondary | ICD-10-CM

## 2024-03-13 HISTORY — PX: CARDIOVERSION: EP1203

## 2024-03-13 SURGERY — CARDIOVERSION (CATH LAB)
Anesthesia: General

## 2024-03-13 MED ORDER — BUMETANIDE 2 MG PO TABS: 1.0000 mg | ORAL_TABLET | Freq: Every day | ORAL | Status: DC

## 2024-03-13 MED ORDER — LIDOCAINE 2% (20 MG/ML) 5 ML SYRINGE
INTRAMUSCULAR | Status: DC | PRN
  Administered 2024-03-13: 100 mg via INTRAVENOUS

## 2024-03-13 MED ORDER — PROPOFOL 10 MG/ML IV BOLUS
INTRAVENOUS | Status: DC | PRN
  Administered 2024-03-13: 60 mg via INTRAVENOUS

## 2024-03-13 MED ORDER — SODIUM CHLORIDE 0.9% FLUSH: 3.0000 mL | INTRAVENOUS | Status: DC | PRN

## 2024-03-13 MED ORDER — SODIUM CHLORIDE 0.9% FLUSH
3.0000 mL | Freq: Two times a day (BID) | INTRAVENOUS | Status: DC
  Administered 2024-03-13: 10 mL via INTRAVENOUS

## 2024-03-13 SURGICAL SUPPLY — 1 items: PAD DEFIB RADIO PHYSIO CONN (PAD) ×1 IMPLANT

## 2024-03-13 NOTE — Anesthesia Preprocedure Evaluation (Addendum)
 Anesthesia Evaluation  Patient identified by MRN, date of birth, ID band Patient awake    Reviewed: Allergy & Precautions, NPO status , Patient's Chart, lab work & pertinent test results, reviewed documented beta blocker date and time   History of Anesthesia Complications Negative for: history of anesthetic complications  Airway Mallampati: II  TM Distance: >3 FB     Dental no notable dental hx.    Pulmonary neg sleep apnea, neg recent URI, neg PE Recurrent pleural effusions   breath sounds clear to auscultation       Cardiovascular hypertension, + CAD, + CABG and +CHF  + dysrhythmias Atrial Fibrillation  Rhythm:Irregular Rate:Normal     Neuro/Psych  Neuromuscular disease    GI/Hepatic ,GERD  ,,(+) neg Cirrhosis        Endo/Other  diabetes, Type 2Hypothyroidism    Renal/GU CRFRenal disease     Musculoskeletal  (+) Arthritis ,    Abdominal   Peds  Hematology   Anesthesia Other Findings   Reproductive/Obstetrics                             Anesthesia Physical Anesthesia Plan  ASA: 3  Anesthesia Plan: General   Post-op Pain Management:    Induction: Intravenous  PONV Risk Score and Plan: 1 and Ondansetron  and Propofol  infusion  Airway Management Planned: Natural Airway, Simple Face Mask and Nasal Cannula  Additional Equipment:   Intra-op Plan:   Post-operative Plan:   Informed Consent: I have reviewed the patients History and Physical, chart, labs and discussed the procedure including the risks, benefits and alternatives for the proposed anesthesia with the patient or authorized representative who has indicated his/her understanding and acceptance.     Dental advisory given  Plan Discussed with: CRNA  Anesthesia Plan Comments:        Anesthesia Quick Evaluation

## 2024-03-13 NOTE — Transfer of Care (Signed)
 Immediate Anesthesia Transfer of Care Note  Patient: Darin Mcclure  Procedure(s) Performed: CARDIOVERSION  Patient Location: PACU  Anesthesia Type:MAC  Level of Consciousness: sedated  Airway & Oxygen Therapy: Patient Spontanous Breathing  Post-op Assessment: Report given to RN  Post vital signs: Reviewed and stable  Last Vitals:  Vitals Value Taken Time  BP 101/62   Temp    Pulse 58 03/13/24 1056  Resp 18 03/13/24 1056  SpO2 97 % 03/13/24 1056  Vitals shown include unfiled device data.  Last Pain:  Vitals:   03/13/24 1053  TempSrc:   PainSc: 0-No pain         Complications: No notable events documented.

## 2024-03-13 NOTE — Anesthesia Postprocedure Evaluation (Signed)
 Anesthesia Post Note  Patient: Darin Mcclure  Procedure(s) Performed: CARDIOVERSION     Patient location during evaluation: PACU Anesthesia Type: General Level of consciousness: awake and alert Pain management: pain level controlled Vital Signs Assessment: post-procedure vital signs reviewed and stable Respiratory status: spontaneous breathing, nonlabored ventilation, respiratory function stable and patient connected to nasal cannula oxygen Cardiovascular status: blood pressure returned to baseline and stable Postop Assessment: no apparent nausea or vomiting Anesthetic complications: no   No notable events documented.  Last Vitals:  Vitals:   03/13/24 1114 03/13/24 1123  BP:  127/73  Pulse: (!) 57 63  Resp: (!) 21 13  Temp:    SpO2: 99% 96%    Last Pain:  Vitals:   03/13/24 1123  TempSrc:   PainSc: 0-No pain                 Leslye Rast

## 2024-03-13 NOTE — Telephone Encounter (Addendum)
 Called patient in regards to lab results patient verbalized understanding but would like clarification on creatinine level seeming higher not lower, will message provider to get clarification and get back with patient   ----- Message from Ervin Heath sent at 03/13/2024  9:04 AM EDT ----- Renal function about the same as 7 days ago, however slightly down compare to baseline. Let's reduce bumex  to 1/2 tablet daily.

## 2024-03-13 NOTE — CV Procedure (Signed)
 Procedure: Electrical Cardioversion Indications:  Atrial Flutter  Procedure Details:  Consent: Risks of procedure as well as the alternatives and risks of each were explained to the (patient/caregiver).  Consent for procedure obtained.  Time Out: Verified patient identification, verified procedure, site/side was marked, verified correct patient position, special equipment/implants available, medications/allergies/relevent history reviewed, required imaging and test results available. PERFORMED.  Patient placed on cardiac monitor, pulse oximetry, supplemental oxygen as necessary.  Sedation given:  propofol  per anesthesia Pacer pads placed anterior chest.  Cardioverted 1 time(s).  Cardioversion with synchronized biphasic 200J shock.  Evaluation: Findings: Post procedure EKG shows:  sinus bradycardia Complications: None Patient did tolerate procedure well.  Time Spent Directly with the Patient:  20 minutes   Kodie Pick A Antawan Mchugh 03/13/2024, 11:08 AM

## 2024-03-13 NOTE — Interval H&P Note (Signed)
 History and Physical Interval Note:  03/13/2024 10:37 AM  Darin Mcclure  has presented today for surgery, with the diagnosis of AFLUTTER.  The various methods of treatment have been discussed with the patient and family. After consideration of risks, benefits and other options for treatment, the patient has consented to  Procedure(s): CARDIOVERSION (N/A) as a surgical intervention.  The patient's history has been reviewed, patient examined, no change in status, stable for surgery.  I have reviewed the patient's chart and labs.  Questions were answered to the patient's satisfaction.     [name] A Asuzena Weis

## 2024-03-15 ENCOUNTER — Encounter (HOSPITAL_COMMUNITY): Payer: Self-pay | Admitting: Internal Medicine

## 2024-03-18 ENCOUNTER — Telehealth: Payer: Self-pay | Admitting: Cardiology

## 2024-03-18 ENCOUNTER — Other Ambulatory Visit: Payer: Self-pay | Admitting: *Deleted

## 2024-03-18 ENCOUNTER — Ambulatory Visit
Admission: RE | Admit: 2024-03-18 | Discharge: 2024-03-18 | Disposition: A | Discharge status: Home / Self Care | Source: Ambulatory Visit | Attending: Cardiology | Admitting: Cardiology

## 2024-03-18 DIAGNOSIS — Z8709 Personal history of other diseases of the respiratory system: Secondary | ICD-10-CM

## 2024-03-18 DIAGNOSIS — R0602 Shortness of breath: Secondary | ICD-10-CM

## 2024-03-18 MED ORDER — BUMETANIDE 2 MG PO TABS: 2.0000 mg | ORAL_TABLET | Freq: Every day | ORAL | Status: DC

## 2024-03-18 NOTE — Telephone Encounter (Signed)
-----   Message from [place] sent at 03/18/2024  3:35 PM EDT ----- Left pleural effusion is described as small Recommend increasing Bumex  from half tablet to full tablet or 2 mg. Last creatinine 1.73. Repeat basic metabolic profile in 1 week.  Dorothye Gathers, MD

## 2024-03-18 NOTE — Telephone Encounter (Signed)
 Pt c/o Shortness Of Breath: STAT if SOB developed within the last 24 hours or pt is noticeably SOB on the phone  1. Are you currently SOB (can you hear that pt is SOB on the phone)?  No   2. How long have you been experiencing SOB?  Since having heart shocked last Friday  3. Are you SOB when sitting or when up moving around?  When up and moving around  4. Are you currently experiencing any other symptoms?  No, patient mentions BP/HR are good.

## 2024-03-18 NOTE — Telephone Encounter (Signed)
 Per patient since DCCV last week, he can only walk 20-30 feet and then becomes short of breath.  He is unable to complete exercise as he usually does.  No chest pain except when coughing which is not new.    HRs 66-68 BP 130/75 Both are consistent POx 97-98% consistently as well, even during episodes of shortness of breath.    Was not symptomatic w afib previously. No missed doses of Eliquis .    Same thing happened previously after cardioverstion as well, but at that time he was found to have pleural effusion.  1.1 L fluid drained during thoracentesis on 03/06/24.   _________________________________________________________  Reviewed with Dr. Renna Cary and Ervin Heath.  CXR recommended.  Order placed for DRI.  Pt appreciative for assistance and wishes for Dr. Renna Cary or Dr. Deloise Ferries to review the films.  I adv that radiology needs to see and report the xray before official intervention would be done.  I will send to Dr. Deloise Ferries as well.

## 2024-03-18 NOTE — Telephone Encounter (Signed)
 Called patient and reviewed the results and recommendations from Dr. Renna Cary.  Pt stated that he was reduced to half tablet due to kidney function.  Adv to start the increased dose of 2 mg tomorrow and return for blood work next wed 03/25/24.  He has follow up with APP on 03/26/24.  He is in agreement to try this and will call back if no improvement or worsening symptoms.

## 2024-03-24 ENCOUNTER — Telehealth: Payer: Self-pay

## 2024-03-24 NOTE — Telephone Encounter (Signed)
 Darin Mcclure HHPT with [facility] contacted the office requesting verbal orders for HHPT for 1w8. Patient is s/p CABG with Dr. Deloise Ferries 01/21/24. Verbal orders given via secure voicemail. Will return call if needed.

## 2024-03-25 NOTE — Progress Notes (Addendum)
 Cardiology Office Note:  .   Date:  03/26/2024  ID:  Darin Mcclure, DOB 1941-05-26, MRN [id number] PCP: [name], MD  [facility] Providers Cardiologist:  Dorothye Gathers, MD    Patient Profile: .      PMH Coronary artery disease DES to RCA 09/2016 Repeat cardiac cath 2009 showed patent stent in RCA, moderate disease involving LAD and diagonal branches Stress test 01/12/2021 - low risk no ischemia LHC 01/15/2024 Proximal RCA 1 lesion 80 Proximal RCA to lesion 30 Mid RCA 30 Mid Cx 90 Second marginal 90 Mid LAD 80 Distal LAD 70 Second diagonal 95 Stent in mRCA with minimal restenosis Normal LVEDP S/p CABG x 4 with MAZE and LA clip on 01/21/24 Pure hypercholesterolemia Hypertriglyceridemia Atrial fibrillation Type 2 diabetes mellitus Peripheral neuropathy Carotid artery disease Right bundle branch block Hypertension Mitral valve stenosis Pleural effusion  History of coronary artery disease with DES (Cypher 3.0) to mid RCA, ostium of left main coronary artery normal by IVUS in November 2007.  Subsequent catheterization 09/29/2008 revealed patent RCA stent, EF 70%, moderate coronary artery disease involving the LAD, diagonal, and circumflex branches.  Stress test 11/2014 showed mild small area of infarct in the inferior wall, LV function abnormal and mid inferior and basal inferior wall with EF 50%.  Carotid duplex showed no significant stenosis bilaterally.  A calcified stable plaque of the carotid bulbs mostly on the left.   He established care with Dr. Renna Mcclure in 2019 after moving from [place] .  He reported past medical history of diabetes with peripheral neuropathy, hyperlipidemia, and hypertension.  He retired from [employer] after 50 years and moved to [place] to be with his grandchildren.  Seen in cardiology clinic 01/15/2023 at which time he reported chronic back pain that somewhat limited his activity.  He was looking to lose weight and wanted to work with a  Systems analyst that is also a nutritionist.  He was not having any concerning cardiac symptoms and was recommended to follow-up in 1 year.  He underwent lumbar laminectomy with Dr. Rochelle Mcclure 02/2023, at which time he held Plavix  and aspirin  perioperatively.  He presented to the ED  01/14/2024 with atrial fibrillation. Found to have influenza A. He had recently had symptoms of URI treated with course of antibiotics and steroids completed on 01/13/2024.  On DOA, he was having shortness of breath and chest pain. Pain was midsternal and did not radiate but did improve with rest.  He also had increasing fatigue.  Upon arrival he was found to be in atrial fibrillation with intermittent episodes of RVR with heart rate up to the 150s, RBBB. Cardiology was consulted. BNP was 66. Hs troponin 94 >>94. Echo revealed normal LV EF, no RWMA, mod LVH, normal RV, mild to mod MS. He underwent LHC 2/19 which revealed severe mid LAD stenosis at takeoff of small caliber diagonal branch, diagonal branch with severe stenosis, mid circumflex severe stenosis involving the ostium of a large caliber obtuse marginal branch, large dominant RCA heavily calcified throughout proximal to mid vessel, proximal vessel has heavily calcified severe stenosis, stent in mid RCA with minimal restenosis.  Recommendation for CABG.    He underwent CABG x 4, maze, LAA clip with Dr. Deloise Mcclure on 01/21/2024.  Postop course complicated by anemia and did require postop transfusion. Noted to have mild thrombocytopenia.  Triglycerides were elevated at 332, LDL 16. Creatinine increased to 1.81 postop but did improve to 1.59 at time of discharge. He was discharged on  01/26/2024.  He continued to have cough that prevented him from sleeping as well as becoming more SOB with ambulation.  He underwent CXR which revealed mild to moderate left pleural effusion. He underwent left thoracentesis on 01/31/2024.   Seen by me on 02/10/24 for hospital follow-up s/p CABG, accompanied  by his wife. He reported increased shortness of breath since thoracentesis on 01/31/24. He felt significant improvement in his breathing following the thoracentesis but SOB has gradually worsened since that time. Walking into the office from the car today was the most he has exerted himself since surgery. Not aware that HR is elevated, no palpitations.  He reported pain in left chest with coughing, otherwise no chest pain. Bilateral LE edema that has improved but still persists. During hospitalization, he did not feel AF RVR.  PT has come to his home but he has had difficulty completing their assigned exercises. Overall he is sleeping well, but had difficulty getting comfortable last night. He denied orthopnea, PND. No concerning side effects with medications. He has several questions regarding some of the changes in the medications. Has noted HR 60-80 bpm at rest consistently. Unfortunately, EKG revealed tachycardia, uncertain whether it is atrial flutter or sinus tachycardia. Reviewed with Dr. Renna Mcclure, primary cards, who advised we continue amiodarone  200 mg daily and increase Toprol  to 50 mg twice daily.  We increased furosemide  for 1 week and advised him to have CXR on morning prior to appointment with Dr. Deloise Mcclure.  Labs obtained that day with improvement in hemoglobin and hematocrit since surgery, creatinine remained elevated but stable.  CXR 02/14/2024 revealed improvement in pleural effusion.  He was advised to reduce Lasix  to 40 mg and potassium to 10 mEq for 1 week and then discontinue.  Seen in follow-up by Dr. Renna Mcclure on 02/26/2024.  EKG revealed atrial flutter with HR 115 bpm.  He reported possible seroma at vein site of his leg which was drained by CT surgery team and wrapped.  He was advised to increase Eliquis  to 5 mg twice daily due to improved kidney function.  He underwent successful DCCV 03/03/2024.  He called the following day to report shortness of breath with walking 10 feet.  He was advised to  go to ER for evaluation.  He was afebrile.  BNP was 170. Troponins 22 >> 25.  D-dimer was elevated.  CXR without evidence of pulmonary edema. CT scan was negative for PE. Cardiology was consulted and he was admitted for observation.  Per CT surgery team, CT confirmed moderate left pleural effusion with compressive type atelectasis on lower left lobe.  Also found to have a 4 mm right middle lobe pulmonary nodule.  Echo 03/05/2024 revealed LVEF 60 to 65%, no WMA, mild LVH, elevated LVEDP, mildly elevated pulmonary artery systolic pressure, mild MR, elevated right atrial pressure of 8 mmHg.  With IV diuresis, weight improved from 222 lb to 216.9 lb. He underwent thoracentesis with removal of 1.1 L clear amber-colored pleural fluid.  Postprocedure CXR showed no pneumothorax.  He was discharged on 2 mg of Bumex  given poor response to po Lasix .  Follow-up lab work 03/12/2024 revealed elevated creatinine and Bumex  was reduced to half tablet (1 mg) daily.  He called our office 03/18/2024 to report shortness of breath.  Reported he can only walk 20 to 30 feet before becoming SOB.  No chest pain except when coughing which is not new.  BP and HR were well-controlled.  He was advised to increase Bumex  to 1 full tablet  or 2 mg daily.       History of Present Illness: .    History of Present Illness Darin Mcclure is a very pleasant 83 year old male who is here today for follow-up of atrial fibrillation and coronary artery disease. He is accompanied by his wfie. He reports persistent shortness of breath and concerns about medication effects since cardioversion. He does not feel that his HR elevates appropriately.  He experiences persistent shortness of breath with varying intensity. Post-cardioversion two weeks ago, he noticed significant shortness of breath and reduced exercise tolerance, sometimes unable to walk more than 20 feet. Previously, he could walk about 50 yards before experiencing symptoms. His shortness of  breath is periodic, with some days allowing stair climbing with minimal issues, while other days he struggles with short distances. He denies chest pain, swelling, PND, or orthopnea. He reports brisk urine output on Bumex . Creatinine levels have increased from 1.73 to 1.87 on most recent labs. He experienced lightheadedness following his last cardioversion, with one significant episode occurring a few days post-procedure.  No recent episodes of lightheadedness. BP remains stable around 132/73, and his weight is stable around 210 pounds after a 25-pound loss since his hospital stay. He denies chest pain, presyncope or syncope.   Discussed the use of AI scribe software for clinical note transcription with the patient, who gave verbal consent to proceed.   ROS: See HPI       Studies Reviewed: Aaron Aas   EKG Interpretation Date/Time:  Thursday Mar 26 2024 13:56:27 EDT Ventricular Rate:  63 PR Interval:  154 QRS Duration:  148 QT Interval:  480 QTC Calculation: 491 R Axis:   -1  Text Interpretation: Normal sinus rhythm Right bundle branch block When compared with ECG of 13-Mar-2024 11:13, No significant change was found Confirmed by Slater Duncan [phone]) on 03/26/2024 2:04:27 PM     No results found for: "LIPOA"    Risk Assessment/Calculations:    CHA2DS2-VASc Score = 5   This indicates a 7.2% annual risk of stroke. The patient's score is based upon: CHF History: 0 HTN History: 1 Diabetes History: 1 Stroke History: 0 Vascular Disease History: 1 Age Score: 2 Gender Score: 0            Physical Exam:   VS:  BP 130/72   Pulse 63   Ht 6' (1.829 m)   Wt 211 lb 4.8 oz (95.8 kg)   SpO2 95%   BMI 28.66 kg/m    Wt Readings from Last 3 Encounters:  03/26/24 211 lb 4.8 oz (95.8 kg)  03/12/24 214 lb (97.1 kg)  03/06/24 216 lb 14.9 oz (98.4 kg)    GEN: Well nourished, well developed in no acute distress NECK: No JVD; No carotid bruits CARDIAC: RRR, no murmurs, rubs,  gallops RESPIRATORY:  No increased work of breathing. Diminished breath sounds left lower lobe. No rales, wheezing or rhonchi  ABDOMEN: Sternotomy incision is well approximated, no signs of infection. Soft, non-tender, non-distended EXTREMITIES:  No edema; No deformity     ASSESSMENT AND PLAN: .    Shortness of breath/History of pleural effusion: History of post-op recurrent left pleural effusion with thoracentesis x 2.  Reports that he continues to have shortness of breath that he thinks slightly worsened after cardioversion. Reports he is doing some exercise and at times is able to complete exercise without dyspnea, while at other times he feels like he has to stop to rest. Is concerned that HR does not  increase much with exertion. No chest pain.  We did a walking test around the office, approximately 200 yards. He stopped once to rest but no significant dyspnea with exertion. HR did elevate appropriately. He ambulates slowly.  Advised this is likely secondary to deconditioning from CABG, recurrent pleural effusion, and atrial fibrillation. Overall, he seems to be improving. No increased work of breathing. Advised continued gradual increase in activity and should be ready to start cardiac rehab soon.   CAD s/p CABG: S/p CABG x 4 on 01/21/24, previously placed stent in mRCA with minimal restenosis on cath 01/15/24. Increased shortness of breath for the past 3 weeks as noted above. No orthopnea or PND. Mild bilateral LE edema. No symptoms concerning for angina. He is likely deconditioned given recurrent pleural effusion and a fib post CABG. Continue to work with PT to gain strength and endurance. He is cleared from our perspective to start CR, awaiting Dr. Raina Bunting agreement. Continue sternal precautions until cleared by Dr. Deloise Mcclure.    Atrial fibrillation/flutter on chronic anticoagulation s/p LA appendage closure: He presented to hospital on 01/13/2024 in AF RVR. He was asymptomatic. He ultimately  underwent CABG for obstructive CAD at which time he additionally had MAZE and LAA closure for treatment of a fib. He had return of a fib RVR at office visit. 02/10/24. He underwent successful DCCV 03/03/24 and is maintaining SR on EKG today. HR is well controlled. No bleeding concerns. We will continue Eliquis  5 mg twice daily which was advised by Dr. Renna Mcclure for stroke prevention for CHA2DS2-VASc score of  5. Continue amiodarone  200 mg daily and metoprolol  succinate 50 mg daily for rate control.  Diabetes: Preop A1c was 7.1. He was started on Januvia and metformin .  Encouraged soon follow-up with PCP due to CKD, may need to consider alternative diabetes medication.  Hypertension: BP is well controlled. Lisinopril was discontinued in the setting of adding metoprolol  for management of a fib.  Antihypertensive therapy today  CKD Stage IIIb: Scr 1.87 on 03/25/24.  Has had elevated creatinine since hospital discharge.  We will reduce Bumex  to 2 mg alternating with 20 mg every other day.  Advised repeat BMET in 2 weeks. Advised soon follow-up with PCP for management of diabetes. May have to consider discontinuing metformin  if SCr remains elevated.  He also has follow-up CXR on 03/28/2019.  If pleural effusion has resolved, could consider discontinuing Bumex .  Carotid artery disease: Minimal wall thickening in right ICA, 1-39% stenosis in left ICA. No acute concerns today. Will continue to monitor clinically for now.  Hyperlipidemia LDL goal < 55: Lipid panel completed 01/16/2024 with total cholesterol 117, HDL 35, LDL 16, and triglycerides 161.  Continue atorvastatin .      Cardiac Rehabilitation Eligibility Assessment  The patient is ready to start cardiac rehabilitation pending clearance from the cardiac surgeon. Other       Disposition: 3 months with Dr. Renna Mcclure  Signed, Slater Duncan, NP-C

## 2024-03-26 ENCOUNTER — Other Ambulatory Visit: Payer: Self-pay | Admitting: Thoracic Surgery (Cardiothoracic Vascular Surgery)

## 2024-03-26 ENCOUNTER — Ambulatory Visit (HOSPITAL_BASED_OUTPATIENT_CLINIC_OR_DEPARTMENT_OTHER): Admitting: Nurse Practitioner

## 2024-03-26 ENCOUNTER — Encounter (HOSPITAL_BASED_OUTPATIENT_CLINIC_OR_DEPARTMENT_OTHER): Payer: Self-pay | Admitting: Nurse Practitioner

## 2024-03-26 VITALS — BP 130/72 | HR 63 | Ht 72.0 in | Wt 211.3 lb

## 2024-03-26 DIAGNOSIS — Z79899 Other long term (current) drug therapy: Secondary | ICD-10-CM

## 2024-03-26 DIAGNOSIS — I483 Typical atrial flutter: Secondary | ICD-10-CM

## 2024-03-26 DIAGNOSIS — Z8709 Personal history of other diseases of the respiratory system: Secondary | ICD-10-CM

## 2024-03-26 DIAGNOSIS — J90 Pleural effusion, not elsewhere classified: Secondary | ICD-10-CM

## 2024-03-26 DIAGNOSIS — E785 Hyperlipidemia, unspecified: Secondary | ICD-10-CM

## 2024-03-26 DIAGNOSIS — Z951 Presence of aortocoronary bypass graft: Secondary | ICD-10-CM

## 2024-03-26 DIAGNOSIS — E1159 Type 2 diabetes mellitus with other circulatory complications: Secondary | ICD-10-CM

## 2024-03-26 DIAGNOSIS — Z7901 Long term (current) use of anticoagulants: Secondary | ICD-10-CM

## 2024-03-26 DIAGNOSIS — N1832 Chronic kidney disease, stage 3b: Secondary | ICD-10-CM

## 2024-03-26 DIAGNOSIS — I6522 Occlusion and stenosis of left carotid artery: Secondary | ICD-10-CM

## 2024-03-26 DIAGNOSIS — I2510 Atherosclerotic heart disease of native coronary artery without angina pectoris: Secondary | ICD-10-CM

## 2024-03-26 DIAGNOSIS — R0602 Shortness of breath: Secondary | ICD-10-CM

## 2024-03-26 LAB — BASIC METABOLIC PANEL WITH GFR
BUN/Creatinine Ratio: 16 (ref 10–24)
BUN: 29 mg/dL — ABNORMAL HIGH (ref 8–27)
CO2: 24 mmol/L (ref 20–29)
Calcium: 9.8 mg/dL (ref 8.6–10.2)
Chloride: 96 mmol/L (ref 96–106)
Creatinine, Ser: 1.87 mg/dL — ABNORMAL HIGH (ref 0.76–1.27)
Glucose: 123 mg/dL — ABNORMAL HIGH (ref 70–99)
Potassium: 4.6 mmol/L (ref 3.5–5.2)
Sodium: 138 mmol/L (ref 134–144)
eGFR: 36 mL/min/{1.73_m2} — ABNORMAL LOW (ref >59)

## 2024-03-26 MED ORDER — BUMETANIDE 2 MG PO TABS: ORAL_TABLET | ORAL | Status: DC

## 2024-03-26 NOTE — Patient Instructions (Signed)
 Medication Instructions:   CHANGE Bumex  one half (0.5) tablet by mouth alternating with one (1) tablet by mouth ( 2 mg) daily.   *If you need a refill on your cardiac medications before your next appointment, please call your pharmacy*  Lab Work:  Your physician recommends that you return for lab work in TWO weeks. No fasting. Paperwork given to patient today.    If you have labs (blood work) drawn today and your tests are completely normal, you will receive your results only by: MyChart Message (if you have MyChart) OR A paper copy in the mail If you have any lab test that is abnormal or we need to change your treatment, we will call you to review the results.  Testing/Procedures:  None ordered.   Follow-Up: At [facility], you and your health needs are our priority.  As part of our continuing mission to provide you with exceptional heart care, our providers are all part of one team.  This team includes your primary Cardiologist (physician) and Advanced Practice Providers or APPs (Physician Assistants and Nurse Practitioners) who all work together to provide you with the care you need, when you need it.  Your next appointment:   1 month(s)  Provider:   Dorothye Gathers, MD    We recommend signing up for the patient portal called "MyChart".  Sign up information is provided on this After Visit Summary.  MyChart is used to connect with patients for Virtual Visits (Telemedicine).  Patients are able to view lab/test results, encounter notes, upcoming appointments, etc.  Non-urgent messages can be sent to your provider as well.   To learn more about what you can do with MyChart, go to [email or web address].

## 2024-03-27 ENCOUNTER — Ambulatory Visit
Payer: Self-pay | Attending: Thoracic Surgery (Cardiothoracic Vascular Surgery) | Admitting: Thoracic Surgery (Cardiothoracic Vascular Surgery)

## 2024-03-27 ENCOUNTER — Ambulatory Visit (HOSPITAL_COMMUNITY)
Admission: RE | Admit: 2024-03-27 | Discharge: 2024-03-27 | Disposition: A | Discharge status: Home / Self Care | Source: Ambulatory Visit | Attending: Cardiology | Admitting: Cardiology

## 2024-03-27 VITALS — BP 139/75 | HR 65 | Resp 18 | Ht 72.0 in | Wt 213.0 lb

## 2024-03-27 DIAGNOSIS — J90 Pleural effusion, not elsewhere classified: Secondary | ICD-10-CM | POA: Insufficient documentation

## 2024-03-27 DIAGNOSIS — L7634 Postprocedural seroma of skin and subcutaneous tissue following other procedure: Secondary | ICD-10-CM

## 2024-03-27 DIAGNOSIS — Z951 Presence of aortocoronary bypass graft: Secondary | ICD-10-CM | POA: Diagnosis present

## 2024-03-27 NOTE — Progress Notes (Signed)
     [address]       [place] [address]             [id number]       Darin Mcclure presents to discuss swelling at his vein harvest site.  Vitals:   03/27/24 0841  BP: 139/75  Pulse: 65  Resp: 18  SpO2: 95%   No erythema Fullness, and fluid under the skin No pain  82yo male s/p CABG with seroma at the right LE vein harvest site.  I explained to him that wound care for this would be very cumbersome given its location.  He likely would require a long incision with capsule removal and a wound vac for several weeks.  Since he is asymptomatic, I have recommended that he try compression knee braces for several months.  He will follow-up after that.

## 2024-04-01 ENCOUNTER — Telehealth (HOSPITAL_COMMUNITY): Payer: Self-pay

## 2024-04-01 NOTE — Telephone Encounter (Signed)
 Called patient to see if he was interested in participating in the Cardiac Rehab Program. Patient will come in for orientation on 5/15@1030  and will attend the 10:15 exercise class.   Pensions consultant.

## 2024-04-07 ENCOUNTER — Ambulatory Visit
Admission: RE | Admit: 2024-04-07 | Discharge: 2024-04-07 | Disposition: A | Discharge status: Home / Self Care | Source: Ambulatory Visit | Attending: Thoracic Surgery (Cardiothoracic Vascular Surgery) | Admitting: Thoracic Surgery (Cardiothoracic Vascular Surgery)

## 2024-04-07 ENCOUNTER — Telehealth (HOSPITAL_COMMUNITY): Payer: Self-pay

## 2024-04-07 ENCOUNTER — Other Ambulatory Visit: Payer: Self-pay | Admitting: *Deleted

## 2024-04-07 ENCOUNTER — Telehealth: Payer: Self-pay | Admitting: *Deleted

## 2024-04-07 DIAGNOSIS — J90 Pleural effusion, not elsewhere classified: Secondary | ICD-10-CM

## 2024-04-07 DIAGNOSIS — R0602 Shortness of breath: Secondary | ICD-10-CM

## 2024-04-07 NOTE — Telephone Encounter (Signed)
  Called pt to confirm appt for 04/09/24 at 1030. Gave pt instructions for appt, what to wear, office address, eating/taking meds before, and if sick to call and reschedule. Pt voiced understanding, all questions answered.   Health history completed? Yes   Darin Labrum, MS, ACSM-CEP 04/07/2024 4:13 PM

## 2024-04-07 NOTE — Telephone Encounter (Signed)
 Pt wanted to change his cardiac rehab class times to the 8:15 class.

## 2024-04-07 NOTE — Telephone Encounter (Signed)
 Patient contacted the office stating he gained 5lbs over night from Saturday to Sunday. States since weight gain he has noticed an increase in SOB. States he has lost 1lb since Sunday. CXR obtained and reviewed with B. Stehler, PA. Advised patient of a small pleural effusion that is too small for a thoracentesis. Advised patient to contact Cardiology and discuss weight gain and medication management. Patient verbalized understanding.

## 2024-04-08 ENCOUNTER — Telehealth: Payer: Self-pay | Admitting: Cardiology

## 2024-04-08 DIAGNOSIS — Z79899 Other long term (current) drug therapy: Secondary | ICD-10-CM

## 2024-04-08 DIAGNOSIS — I10 Essential (primary) hypertension: Secondary | ICD-10-CM

## 2024-04-08 DIAGNOSIS — R0602 Shortness of breath: Secondary | ICD-10-CM

## 2024-04-08 NOTE — Telephone Encounter (Signed)
 Spoke with pt over the phone who stated that he had a 6lbs weight gain within a 2 day period but has lost 2 of those lbs since (as of today). Pt worked out in the yard Saturday for about 5 hrs and felt like he overworked himself. Sunday is when he started to notice the weight gain. Dr. Deloise Ferries had ordered a CXR that the pt had completed yesterday. Pt had called Dr. Raina Bunting office regarding medication management and result of CXR but they advised him to contact our office instead. CXR results are uploaded but have not been resulted yet. Will forward to Dr. Renna Cary and his covering RN.

## 2024-04-08 NOTE — Telephone Encounter (Signed)
 Pt c/o swelling: STAT is pt has developed SOB within 24 hours  How much weight have you gained and in what time span?  6 lbs in 2 days   If swelling, where is the swelling located?  No swelling, just weight gain. Patient thinks there may be fluid around lungs.   Are you currently taking a fluid pill?  Yes, but patient doesn't remember the name of it  Are you currently SOB?  Has been SOB, not currently  Do you have a log of your daily weights (if so, list)?  5/09: 208 lbs 5/11: 214 lbs 5/14: 212 lbs   Have you gained 3 pounds in a day or 5 pounds in a week?   Have you traveled recently?  No

## 2024-04-09 ENCOUNTER — Encounter (HOSPITAL_COMMUNITY): Payer: Self-pay

## 2024-04-09 ENCOUNTER — Encounter (HOSPITAL_COMMUNITY)
Admission: RE | Admit: 2024-04-09 | Discharge: 2024-04-09 | Disposition: A | Discharge status: Home / Self Care | Source: Ambulatory Visit | Attending: Cardiology | Admitting: Cardiology

## 2024-04-09 VITALS — BP 114/84 | HR 55 | Ht 72.0 in | Wt 214.9 lb

## 2024-04-09 DIAGNOSIS — R001 Bradycardia, unspecified: Secondary | ICD-10-CM | POA: Diagnosis present

## 2024-04-09 DIAGNOSIS — Z951 Presence of aortocoronary bypass graft: Secondary | ICD-10-CM | POA: Diagnosis present

## 2024-04-09 LAB — GLUCOSE, CAPILLARY: Glucose-Capillary: 121 mg/dL — ABNORMAL HIGH (ref 70–99)

## 2024-04-09 NOTE — Progress Notes (Signed)
 Cardiac Rehab Medication Review by a Nurse  Does the patient  feel that his/her medications are working for him/her?  yes  Has the patient been experiencing any side effects to the medications prescribed?  no  Does the patient measure his/her own blood pressure or blood glucose at home?  yes   Does the patient have any problems obtaining medications due to transportation or finances?   no  Understanding of regimen: excellent Understanding of indications: excellent Potential of compliance: excellent    Nurse comments: Darin Mcclure is taking his medications as prescribed and has a good understanding of what his medications are for. Darin Mcclure checks his blood pressures daily.    Darin Mcclure [facility] RN 04/09/2024 10:49 AM

## 2024-04-09 NOTE — Progress Notes (Signed)
 Cardiac Individual Treatment Plan  Patient Details  Name: Darin Mcclure MRN: [id number] Date of Birth: 25-Apr-1941 Referring Provider:   Flowsheet Row INTENSIVE CARDIAC REHAB ORIENT from 04/09/2024 in [id number] [facility]  Referring Provider Dr. 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 As needed for dental procedure, Disp: , Rfl:    apixaban  (ELIQUIS ) 5 MG TABS tablet, Take 1 tablet (5 mg total) by mouth 2 (two) times daily., Disp: 180 tablet, Rfl: 3   aspirin  EC 81 MG tablet, Take 81 mg by mouth daily., Disp: , Rfl:    atorvastatin  (LIPITOR) 40 MG tablet, Take 1 tablet (40 mg total) by mouth at bedtime., Disp: 30 tablet, Rfl: 3   bumetanide  (BUMEX ) 2 MG tablet, Take one half (0.5) tablet by mouth ( 1 mg) alternating with one (1) tablet by mouth (2 mg) every day., Disp: , Rfl:    famotidine  (PEPCID ) 20 MG tablet, Take 1 tablet (20 mg total) by mouth daily. (Patient taking differently: Take 20 mg by mouth daily as needed for heartburn or indigestion.), Disp: 30 tablet, Rfl: 1   fluticasone  (FLONASE ) 50 MCG/ACT nasal spray, Place 1 spray into both nostrils daily as needed for allergies., Disp: , Rfl: 3   Inulin (FIBER CHOICE PO), Take 2 tablets by mouth daily., Disp: , Rfl:    JANUVIA 100 MG tablet, Take 100 mg by mouth daily., Disp: , Rfl:     levothyroxine  (SYNTHROID ) 75 MCG tablet, Take 75 mcg by mouth daily before breakfast., Disp: , Rfl:    Magnesium  500 MG CAPS, Take 1,000 mg by mouth daily., Disp: , Rfl:    metFORMIN  (GLUCOPHAGE ) 1000 MG tablet, Take 1 tablet by mouth 2 (two) times daily., Disp: , Rfl:    metoprolol  succinate (TOPROL -XL) 50 MG 24 hr tablet, Take 1 tablet (50 mg total) by mouth daily. Take with or immediately following a meal., Disp: 90 tablet, Rfl: 3   NONFORMULARY OR COMPOUNDED ITEM, Topical antifungal topical solution: terbinafine 3%, fluconazole 2%, tea tree oil 5%, Urea 10%, ibuprofen2% in DMSO suspension. Sig: Apply to the affected toenail(s) once daily.  Order sent to [facility] [address] 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) 08/26/2017   GERD (gastroesophageal reflux disease)    Heart disease    Hyperglycemia    Hypertension    Mixed hyperlipidemia    Muscle cramp 08/26/2017   Neuropathy    Osteoarthritis    RBBB    RBBB (right bundle branch block)    Type 2 diabetes mellitus (HCC)     Tobacco Use: Social History   Tobacco Use  Smoking Status Never  Smokeless Tobacco Never    Labs: Review Flowsheet  More data exists      Latest Ref Rng & Units 01/14/2024 01/16/2024 01/20/2024 01/21/2024 01/23/2024  Labs for ITP Cardiac and Pulmonary Rehab  Cholestrol 0 - 200 mg/dL - 161  - - -  LDL (calc) 0 - 99 mg/dL -  16  - - -  HDL-C >09 mg/dL - 35  - - -  Trlycerides <150 mg/dL - 604  - - -  Hemoglobin A1c 4.8 - 5.6 % 7.1  - - - -  PH, Arterial 7.35 - 7.45 - - 7.43  7.308  7.289  7.346  7.359  7.302  7.330  7.339  -  PCO2 arterial 32 - 48 mmHg - - 35  41.7  38.9  29.9  33.7  41.2  40.6  42.5  -  Bicarbonate 20.0 - 28.0 mmol/L - - 23.2  20.8  18.6  16.4  19.0  20.4  21.4  22.9  -  TCO2 22 - 32 mmol/L - - - 22  20  17  20  22  25  23  22  23  23  24  24  22    Acid-base deficit 0.0 - 2.0 mmol/L - - 0.6  5.0  7.0  8.0  6.0  6.0  4.0  3.0  -  O2 Saturation % - - 98.7  96  96  97  97  95  100  100  -    Details       Multiple values from one day are sorted in reverse-chronological order         Capillary Blood Glucose: Lab Results  Component Value Date   GLUCAP 121 (H) 04/09/2024   GLUCAP 126 (H) 03/06/2024   GLUCAP 139 (H) 03/06/2024   GLUCAP 109 (H) 03/05/2024   GLUCAP 117 (H) 03/05/2024     Exercise Target Goals: Exercise Program Goal: Individual exercise prescription set using results from initial 6 min walk test and THRR while considering  patient's activity barriers and safety.   Exercise Prescription Goal: Initial exercise prescription builds to 30-45 minutes a day of aerobic activity, 2-3 days per week.  Home exercise guidelines will be given to patient during program as part of exercise prescription that the participant will acknowledge.  Activity Barriers & Risk Stratification:  Activity Barriers & Cardiac Risk Stratification - 04/09/24 1417       Activity Barriers & Cardiac Risk Stratification   Activity Barriers Joint Problems;Deconditioning;Arthritis;Back Problems;Shortness of Breath;Left Hip Replacement;Right Hip Replacement;Left Knee Replacement;Neck/Spine Problems;Balance Concerns    Cardiac Risk Stratification High             6 Minute Walk:  6 Minute Walk     Row Name 04/09/24 1414         6 Minute Walk   Phase Initial     Distance 790 feet     Walk Time  6 minutes     # of Rest Breaks 0  MPH 1.5     METS 1.2     RPE 12     Perceived Dyspnea  1     VO2 Peak 4.31     Symptoms No     Resting HR 55 bpm     Resting BP 114/84     Resting Oxygen Saturation  98 %     Exercise Oxygen Saturation  during 6 min walk 97 %     Max Ex. HR 73 bpm     Max Ex. BP 152/64     2 Minute Post BP 140/64              Oxygen Initial Assessment:   Oxygen Re-Evaluation:   Oxygen Discharge (Final Oxygen Re-Evaluation):   Initial Exercise Prescription:  Initial Exercise Prescription - 04/09/24 1400       Date of Initial Exercise RX and Referring Provider   Date 04/09/24    Referring Provider Dr. Dorothye Gathers, MD    Expected Discharge Date 07/01/24      NuStep   Level 1    SPM 65    Minutes 20    METs 1.3      Prescription Details   Frequency (times per week) 3    Duration Progress to 30 minutes of continuous aerobic without signs/symptoms of physical distress      Intensity   THRR 40-80% of Max Heartrate 56-111    Ratings of Perceived Exertion 11-13    Perceived Dyspnea 0-4      Progression   Progression Continue progressive overload as per policy without signs/symptoms or physical distress.      Resistance Training   Training Prescription Yes    Weight 2    Reps 10-15             Perform Capillary Blood Glucose checks as needed.  Exercise Prescription Changes:   Exercise Comments:   Exercise Goals and Review:   Exercise Goals     Row Name 04/09/24 1419             Exercise Goals   Increase Physical Activity Yes       Intervention Provide advice, education, support and counseling about physical activity/exercise needs.;Develop an individualized exercise prescription for aerobic and resistive training based on initial evaluation findings, risk stratification, comorbidities and participant's personal goals.       Expected Outcomes Short Term: Attend rehab on a regular basis to increase amount of physical  activity.;Long Term: Add in home exercise to make exercise part of routine and to increase amount of physical activity.;Long Term: Exercising regularly at least 3-5 days a week.       Increase Strength and Stamina Yes       Intervention Provide advice, education, support and counseling about physical activity/exercise needs.;Develop an individualized exercise prescription for aerobic and resistive training based on initial evaluation findings, risk stratification, comorbidities and participant's personal goals.       Expected Outcomes Short Term: Increase workloads from initial exercise prescription for resistance, speed, and METs.;Short Term: Perform resistance training exercises routinely during rehab and add in resistance training at home;Long Term: Improve cardiorespiratory fitness, muscular endurance and strength as measured by increased METs and functional capacity ( )       Able to understand and use rate of perceived exertion (RPE) scale Yes       Intervention Provide education and explanation on how to use RPE scale       Expected Outcomes Short Term: Able to  use RPE daily in rehab to express subjective intensity level;Long Term:  Able to use RPE to guide intensity level when exercising independently       Able to understand and use Dyspnea scale Yes       Intervention Provide education and explanation on how to use Dyspnea scale       Expected Outcomes Short Term: Able to use Dyspnea scale daily in rehab to express subjective sense of shortness of breath during exertion;Long Term: Able to use Dyspnea scale to guide intensity level when exercising independently       Knowledge and understanding of Target Heart Rate Range (THRR) Yes       Intervention Provide education and explanation of THRR including how the numbers were predicted and where they are located for reference       Expected Outcomes Short Term: Able to state/look up THRR;Long Term: Able to use THRR to govern intensity when  exercising independently;Short Term: Able to use daily as guideline for intensity in rehab       Understanding of Exercise Prescription Yes       Intervention Provide education, explanation, and written materials on patient's individual exercise prescription       Expected Outcomes Short Term: Able to explain program exercise prescription;Long Term: Able to explain home exercise prescription to exercise independently                Exercise Goals Re-Evaluation :   Discharge Exercise Prescription (Final Exercise Prescription Changes):   Nutrition:  Target Goals: Understanding of nutrition guidelines, daily intake of sodium 1500mg , cholesterol 200mg , calories 30% from fat and 7% or less from saturated fats, daily to have 5 or more servings of fruits and vegetables.  Biometrics:  Pre Biometrics - 04/09/24 1420       Pre Biometrics   Height 6' (1.829 m)    Weight 97.5 kg    Waist Circumference 43 inches    Hip Circumference 43 inches    Waist to Hip Ratio 1 %    BMI (Calculated) 29.15    Triceps Skinfold 18 mm    % Body Fat 30.3 %    Grip Strength 30 kg    Flexibility 7 in    Single Leg Stand 4 seconds              Nutrition Therapy Plan and Nutrition Goals:   Nutrition Assessments:  MEDIFICTS Score Key: >=70 Need to make dietary changes  40-70 Heart Healthy Diet <= 40 Therapeutic Level Cholesterol Diet    Picture Your Plate Scores: <78 Unhealthy dietary pattern with much room for improvement. 41-50 Dietary pattern unlikely to meet recommendations for good health and room for improvement. 51-60 More healthful dietary pattern, with some room for improvement.  >60 Healthy dietary pattern, although there may be some specific behaviors that could be improved.    Nutrition Goals Re-Evaluation:   Nutrition Goals Re-Evaluation:   Nutrition Goals Discharge (Final Nutrition Goals Re-Evaluation):   Psychosocial: Target Goals: Acknowledge presence or absence  of significant depression and/or stress, maximize coping skills, provide positive support system. Participant is able to verbalize types and ability to use techniques and skills needed for reducing stress and depression.  Initial Review & Psychosocial Screening:  Initial Psych Review & Screening - 04/09/24 1112       Initial Review   Current issues with Current Stress Concerns    Source of Stress Concerns Chronic Illness    Comments Darin Mcclure has felt some stress  regarding his recent open heart surgery. Darin Mcclure said that he overdid it this past weekend and was concerned about gaining weight. Darin Mcclure says he has been experiening some shortness of breath since surgery.      Family Dynamics   Good Support System? Yes   Darin Mcclure has his wife Darin Mcclure 3 children and 4 gnad children for support     Barriers   Psychosocial barriers to participate in program The patient should benefit from training in stress management and relaxation.      Screening Interventions   Interventions Encouraged to exercise;Provide feedback about the scores to participant    Expected Outcomes Long Term Goal: Stressors or current issues are controlled or eliminated.;Short Term goal: Identification and review with participant of any Quality of Life or Depression concerns found by scoring the questionnaire.;Long Term goal: The participant improves quality of Life and PHQ9 Scores as seen by post scores and/or verbalization of changes             Quality of Life Scores:  Quality of Life - 04/09/24 1425       Quality of Life   Select Quality of Life      Quality of Life Scores   Health/Function Pre 21.03 %    Socioeconomic Pre 25.75 %    Psych/Spiritual Pre 24.86 %    Family Pre 24 %    GLOBAL Pre 23.15 %            Scores of 19 and below usually indicate a poorer quality of life in these areas.  A difference of  2-3 points is a clinically meaningful difference.  A difference of 2-3 points in the total score of the Quality of  Life Index has been associated with significant improvement in overall quality of life, self-image, physical symptoms, and general health in studies assessing change in quality of life.  PHQ-9: Review Flowsheet       04/09/2024  Depression screen PHQ 2/9  Decreased Interest 0  Down, Depressed, Hopeless 0  PHQ - 2 Score 0  Altered sleeping 0  Tired, decreased energy 1  Change in appetite 1  Feeling bad or failure about yourself  0  Trouble concentrating 0  Moving slowly or fidgety/restless 1  Suicidal thoughts 0  PHQ-9 Score 3  Difficult doing work/chores Somewhat difficult   Interpretation of Total Score  Total Score Depression Severity:  1-4 = Minimal depression, 5-9 = Mild depression, 10-14 = Moderate depression, 15-19 = Moderately severe depression, 20-27 = Severe depression   Psychosocial Evaluation and Intervention:   Psychosocial Re-Evaluation:   Psychosocial Discharge (Final Psychosocial Re-Evaluation):   Vocational Rehabilitation: Provide vocational rehab assistance to qualifying candidates.   Vocational Rehab Evaluation & Intervention:  Vocational Rehab - 04/09/24 1217       Initial Vocational Rehab Evaluation & Intervention   Assessment shows need for Vocational Rehabilitation No   Darin Mcclure is retired and does not need vocational rehab at this time            Education: Education Goals: Education classes will be provided on a weekly basis, covering required topics. Participant will state understanding/return demonstration of topics presented.     Core Videos: Exercise    Move It!  Clinical staff conducted group or individual video education with verbal and written material and guidebook.  Patient learns the recommended Pritikin exercise program. Exercise with the goal of living a long, healthy life. Some of the health benefits of exercise include controlled diabetes, healthier blood  pressure levels, improved cholesterol levels, improved heart and lung  capacity, improved sleep, and better body composition. Everyone should speak with their doctor before starting or changing an exercise routine.  Biomechanical Limitations Clinical staff conducted group or individual video education with verbal and written material and guidebook.  Patient learns how biomechanical limitations can impact exercise and how we can mitigate and possibly overcome limitations to have an impactful and balanced exercise routine.  Body Composition Clinical staff conducted group or individual video education with verbal and written material and guidebook.  Patient learns that body composition (ratio of muscle mass to fat mass) is a key component to assessing overall fitness, rather than body weight alone. Increased fat mass, especially visceral belly fat, can put us  at increased risk for metabolic syndrome, type 2 diabetes, heart disease, and even death. It is recommended to combine diet and exercise (cardiovascular and resistance training) to improve your body composition. Seek guidance from your physician and exercise physiologist before implementing an exercise routine.  Exercise Action Plan Clinical staff conducted group or individual video education with verbal and written material and guidebook.  Patient learns the recommended strategies to achieve and enjoy long-term exercise adherence, including variety, self-motivation, self-efficacy, and positive decision making. Benefits of exercise include fitness, good health, weight management, more energy, better sleep, less stress, and overall well-being.  Medical   Heart Disease Risk Reduction Clinical staff conducted group or individual video education with verbal and written material and guidebook.  Patient learns our heart is our most vital organ as it circulates oxygen, nutrients, white blood cells, and hormones throughout the entire body, and carries waste away. Data supports a plant-based eating plan like the Pritikin  Program for its effectiveness in slowing progression of and reversing heart disease. The video provides a number of recommendations to address heart disease.   Metabolic Syndrome and Belly Fat  Clinical staff conducted group or individual video education with verbal and written material and guidebook.  Patient learns what metabolic syndrome is, how it leads to heart disease, and how one can reverse it and keep it from coming back. You have metabolic syndrome if you have 3 of the following 5 criteria: abdominal obesity, high blood pressure, high triglycerides, low HDL cholesterol, and high blood sugar.  Hypertension and Heart Disease Clinical staff conducted group or individual video education with verbal and written material and guidebook.  Patient learns that high blood pressure, or hypertension, is very common in the [place] . Hypertension is largely due to excessive salt intake, but other important risk factors include being overweight, physical inactivity, drinking too much alcohol, smoking, and not eating enough potassium from fruits and vegetables. High blood pressure is a leading risk factor for heart attack, stroke, congestive heart failure, dementia, kidney failure, and premature death. Long-term effects of excessive salt intake include stiffening of the arteries and thickening of heart muscle and organ damage. Recommendations include ways to reduce hypertension and the risk of heart disease.  Diseases of Our Time - Focusing on Diabetes Clinical staff conducted group or individual video education with verbal and written material and guidebook.  Patient learns why the best way to stop diseases of our time is prevention, through food and other lifestyle changes. Medicine (such as prescription pills and surgeries) is often only a Band-Aid on the problem, not a long-term solution. Most common diseases of our time include obesity, type 2 diabetes, hypertension, heart disease, and cancer. The  Pritikin Program is recommended and has been proven to  help reduce, reverse, and/or prevent the damaging effects of metabolic syndrome.  Nutrition   Overview of the Pritikin Eating Plan  Clinical staff conducted group or individual video education with verbal and written material and guidebook.  Patient learns about the Pritikin Eating Plan for disease risk reduction. The Pritikin Eating Plan emphasizes a wide variety of unrefined, minimally-processed carbohydrates, like fruits, vegetables, whole grains, and legumes. Go, Caution, and Stop food choices are explained. Plant-based and lean animal proteins are emphasized. Rationale provided for low sodium intake for blood pressure control, low added sugars for blood sugar stabilization, and low added fats and oils for coronary artery disease risk reduction and weight management.  Calorie Density  Clinical staff conducted group or individual video education with verbal and written material and guidebook.  Patient learns about calorie density and how it impacts the Pritikin Eating Plan. Knowing the characteristics of the food you choose will help you decide whether those foods will lead to weight gain or weight loss, and whether you want to consume more or less of them. Weight loss is usually a side effect of the Pritikin Eating Plan because of its focus on low calorie-dense foods.  Label Reading  Clinical staff conducted group or individual video education with verbal and written material and guidebook.  Patient learns about the Pritikin recommended label reading guidelines and corresponding recommendations regarding calorie density, added sugars, sodium content, and whole grains.  Dining Out - Part 1  Clinical staff conducted group or individual video education with verbal and written material and guidebook.  Patient learns that restaurant meals can be sabotaging because they can be so high in calories, fat, sodium, and/or sugar. Patient learns  recommended strategies on how to positively address this and avoid unhealthy pitfalls.  Facts on Fats  Clinical staff conducted group or individual video education with verbal and written material and guidebook.  Patient learns that lifestyle modifications can be just as effective, if not more so, as many medications for lowering your risk of heart disease. A Pritikin lifestyle can help to reduce your risk of inflammation and atherosclerosis (cholesterol build-up, or plaque, in the artery walls). Lifestyle interventions such as dietary choices and physical activity address the cause of atherosclerosis. A review of the types of fats and their impact on blood cholesterol levels, along with dietary recommendations to reduce fat intake is also included.  Nutrition Action Plan  Clinical staff conducted group or individual video education with verbal and written material and guidebook.  Patient learns how to incorporate Pritikin recommendations into their lifestyle. Recommendations include planning and keeping personal health goals in mind as an important part of their success.  Healthy Mind-Set    Healthy Minds, Bodies, Hearts  Clinical staff conducted group or individual video education with verbal and written material and guidebook.  Patient learns how to identify when they are stressed. Video will discuss the impact of that stress, as well as the many benefits of stress management. Patient will also be introduced to stress management techniques. The way we think, act, and feel has an impact on our hearts.  How Our Thoughts Can Heal Our Hearts  Clinical staff conducted group or individual video education with verbal and written material and guidebook.  Patient learns that negative thoughts can cause depression and anxiety. This can result in negative lifestyle behavior and serious health problems. Cognitive behavioral therapy is an effective method to help control our thoughts in order to change and  improve our emotional outlook.  Additional  Videos:  Exercise    Improving Performance  Clinical staff conducted group or individual video education with verbal and written material and guidebook.  Patient learns to use a non-linear approach by alternating intensity levels and lengths of time spent exercising to help burn more calories and lose more body fat. Cardiovascular exercise helps improve heart health, metabolism, hormonal balance, blood sugar control, and recovery from fatigue. Resistance training improves strength, endurance, balance, coordination, reaction time, metabolism, and muscle mass. Flexibility exercise improves circulation, posture, and balance. Seek guidance from your physician and exercise physiologist before implementing an exercise routine and learn your capabilities and proper form for all exercise.  Introduction to Yoga  Clinical staff conducted group or individual video education with verbal and written material and guidebook.  Patient learns about yoga, a discipline of the coming together of mind, breath, and body. The benefits of yoga include improved flexibility, improved range of motion, better posture and core strength, increased lung function, weight loss, and positive self-image. Yoga's heart health benefits include lowered blood pressure, healthier heart rate, decreased cholesterol and triglyceride levels, improved immune function, and reduced stress. Seek guidance from your physician and exercise physiologist before implementing an exercise routine and learn your capabilities and proper form for all exercise.  Medical   Aging: Enhancing Your Quality of Life  Clinical staff conducted group or individual video education with verbal and written material and guidebook.  Patient learns key strategies and recommendations to stay in good physical health and enhance quality of life, such as prevention strategies, having an advocate, securing a Health Care Proxy and Power of  Attorney, and keeping a list of medications and system for tracking them. It also discusses how to avoid risk for bone loss.  Biology of Weight Control  Clinical staff conducted group or individual video education with verbal and written material and guidebook.  Patient learns that weight gain occurs because we consume more calories than we burn (eating more, moving less). Even if your body weight is normal, you may have higher ratios of fat compared to muscle mass. Too much body fat puts you at increased risk for cardiovascular disease, heart attack, stroke, type 2 diabetes, and obesity-related cancers. In addition to exercise, following the Pritikin Eating Plan can help reduce your risk.  Decoding Lab Results  Clinical staff conducted group or individual video education with verbal and written material and guidebook.  Patient learns that lab test reflects one measurement whose values change over time and are influenced by many factors, including medication, stress, sleep, exercise, food, hydration, pre-existing medical conditions, and more. It is recommended to use the knowledge from this video to become more involved with your lab results and evaluate your numbers to speak with your doctor.   Diseases of Our Time - Overview  Clinical staff conducted group or individual video education with verbal and written material and guidebook.  Patient learns that according to the CDC, 50% to 70% of chronic diseases (such as obesity, type 2 diabetes, elevated lipids, hypertension, and heart disease) are avoidable through lifestyle improvements including healthier food choices, listening to satiety cues, and increased physical activity.  Sleep Disorders Clinical staff conducted group or individual video education with verbal and written material and guidebook.  Patient learns how good quality and duration of sleep are important to overall health and well-being. Patient also learns about sleep disorders and  how they impact health along with recommendations to address them, including discussing with a physician.  Nutrition  Dining Out -  Part 2 Clinical staff conducted group or individual video education with verbal and written material and guidebook.  Patient learns how to plan ahead and communicate in order to maximize their dining experience in a healthy and nutritious manner. Included are recommended food choices based on the type of restaurant the patient is visiting.   Fueling a Banker conducted group or individual video education with verbal and written material and guidebook.  There is a strong connection between our food choices and our health. Diseases like obesity and type 2 diabetes are very prevalent and are in large-part due to lifestyle choices. The Pritikin Eating Plan provides plenty of food and hunger-curbing satisfaction. It is easy to follow, affordable, and helps reduce health risks.  Menu Workshop  Clinical staff conducted group or individual video education with verbal and written material and guidebook.  Patient learns that restaurant meals can sabotage health goals because they are often packed with calories, fat, sodium, and sugar. Recommendations include strategies to plan ahead and to communicate with the manager, chef, or server to help order a healthier meal.  Planning Your Eating Strategy  Clinical staff conducted group or individual video education with verbal and written material and guidebook.  Patient learns about the Pritikin Eating Plan and its benefit of reducing the risk of disease. The Pritikin Eating Plan does not focus on calories. Instead, it emphasizes high-quality, nutrient-rich foods. By knowing the characteristics of the foods, we choose, we can determine their calorie density and make informed decisions.  Targeting Your Nutrition Priorities  Clinical staff conducted group or individual video education with verbal and written  material and guidebook.  Patient learns that lifestyle habits have a tremendous impact on disease risk and progression. This video provides eating and physical activity recommendations based on your personal health goals, such as reducing LDL cholesterol, losing weight, preventing or controlling type 2 diabetes, and reducing high blood pressure.  Vitamins and Minerals  Clinical staff conducted group or individual video education with verbal and written material and guidebook.  Patient learns different ways to obtain key vitamins and minerals, including through a recommended healthy diet. It is important to discuss all supplements you take with your doctor.   Healthy Mind-Set    Smoking Cessation  Clinical staff conducted group or individual video education with verbal and written material and guidebook.  Patient learns that cigarette smoking and tobacco addiction pose a serious health risk which affects millions of people. Stopping smoking will significantly reduce the risk of heart disease, lung disease, and many forms of cancer. Recommended strategies for quitting are covered, including working with your doctor to develop a successful plan.  Culinary   Becoming a Set designer conducted group or individual video education with verbal and written material and guidebook.  Patient learns that cooking at home can be healthy, cost-effective, quick, and puts them in control. Keys to cooking healthy recipes will include looking at your recipe, assessing your equipment needs, planning ahead, making it simple, choosing cost-effective seasonal ingredients, and limiting the use of added fats, salts, and sugars.  Cooking - Breakfast and Snacks  Clinical staff conducted group or individual video education with verbal and written material and guidebook.  Patient learns how important breakfast is to satiety and nutrition through the entire day. Recommendations include key foods to eat during  breakfast to help stabilize blood sugar levels and to prevent overeating at meals later in the day. Planning ahead is also a key  component.  Cooking - Educational psychologist conducted group or individual video education with verbal and written material and guidebook.  Patient learns eating strategies to improve overall health, including an approach to cook more at home. Recommendations include thinking of animal protein as a side on your plate rather than center stage and focusing instead on lower calorie dense options like vegetables, fruits, whole grains, and plant-based proteins, such as beans. Making sauces in large quantities to freeze for later and leaving the skin on your vegetables are also recommended to maximize your experience.  Cooking - Healthy Salads and Dressing Clinical staff conducted group or individual video education with verbal and written material and guidebook.  Patient learns that vegetables, fruits, whole grains, and legumes are the foundations of the Pritikin Eating Plan. Recommendations include how to incorporate each of these in flavorful and healthy salads, and how to create homemade salad dressings. Proper handling of ingredients is also covered. Cooking - Soups and [employer] - Soups and Desserts Clinical staff conducted group or individual video education with verbal and written material and guidebook.  Patient learns that Pritikin soups and desserts make for easy, nutritious, and delicious snacks and meal components that are low in sodium, fat, sugar, and calorie density, while high in vitamins, minerals, and filling fiber. Recommendations include simple and healthy ideas for soups and desserts.   Overview     The Pritikin Solution Program Overview Clinical staff conducted group or individual video education with verbal and written material and guidebook.  Patient learns that the results of the Pritikin Program have been documented in more than 100  articles published in peer-reviewed journals, and the benefits include reducing risk factors for (and, in some cases, even reversing) high cholesterol, high blood pressure, type 2 diabetes, obesity, and more! An overview of the three key pillars of the Pritikin Program will be covered: eating well, doing regular exercise, and having a healthy mind-set.  WORKSHOPS  Exercise: Exercise Basics: Building Your Action Plan Clinical staff led group instruction and group discussion with PowerPoint presentation and patient guidebook. To enhance the learning environment the use of posters, models and videos may be added. At the conclusion of this workshop, patients will comprehend the difference between physical activity and exercise, as well as the benefits of incorporating both, into their routine. Patients will understand the FITT (Frequency, Intensity, Time, and Type) principle and how to use it to build an exercise action plan. In addition, safety concerns and other considerations for exercise and cardiac rehab will be addressed by the presenter. The purpose of this lesson is to promote a comprehensive and effective weekly exercise routine in order to improve patients' overall level of fitness.   Managing Heart Disease: Your Path to a Healthier Heart Clinical staff led group instruction and group discussion with PowerPoint presentation and patient guidebook. To enhance the learning environment the use of posters, models and videos may be added.At the conclusion of this workshop, patients will understand the anatomy and physiology of the heart. Additionally, they will understand how Pritikin's three pillars impact the risk factors, the progression, and the management of heart disease.  The purpose of this lesson is to provide a high-level overview of the heart, heart disease, and how the Pritikin lifestyle positively impacts risk factors.  Exercise Biomechanics Clinical staff led group instruction and  group discussion with PowerPoint presentation and patient guidebook. To enhance the learning environment the use of posters, models and videos may be added.  Patients will learn how the structural parts of their bodies function and how these functions impact their daily activities, movement, and exercise. Patients will learn how to promote a neutral spine, learn how to manage pain, and identify ways to improve their physical movement in order to promote healthy living. The purpose of this lesson is to expose patients to common physical limitations that impact physical activity. Participants will learn practical ways to adapt and manage aches and pains, and to minimize their effect on regular exercise. Patients will learn how to maintain good posture while sitting, walking, and lifting.  Balance Training and Fall Prevention  Clinical staff led group instruction and group discussion with PowerPoint presentation and patient guidebook. To enhance the learning environment the use of posters, models and videos may be added. At the conclusion of this workshop, patients will understand the importance of their sensorimotor skills (vision, proprioception, and the vestibular system) in maintaining their ability to balance as they age. Patients will apply a variety of balancing exercises that are appropriate for their current level of function. Patients will understand the common causes for poor balance, possible solutions to these problems, and ways to modify their physical environment in order to minimize their fall risk. The purpose of this lesson is to teach patients about the importance of maintaining balance as they age and ways to minimize their risk of falling.  WORKSHOPS   Nutrition:  Fueling a Ship broker led group instruction and group discussion with PowerPoint presentation and patient guidebook. To enhance the learning environment the use of posters, models and videos may be  added. Patients will review the foundational principles of the Pritikin Eating Plan and understand what constitutes a serving size in each of the food groups. Patients will also learn Pritikin-friendly foods that are better choices when away from home and review make-ahead meal and snack options. Calorie density will be reviewed and applied to three nutrition priorities: weight maintenance, weight loss, and weight gain. The purpose of this lesson is to reinforce (in a group setting) the key concepts around what patients are recommended to eat and how to apply these guidelines when away from home by planning and selecting Pritikin-friendly options. Patients will understand how calorie density may be adjusted for different weight management goals.  Mindful Eating  Clinical staff led group instruction and group discussion with PowerPoint presentation and patient guidebook. To enhance the learning environment the use of posters, models and videos may be added. Patients will briefly review the concepts of the Pritikin Eating Plan and the importance of low-calorie dense foods. The concept of mindful eating will be introduced as well as the importance of paying attention to internal hunger signals. Triggers for non-hunger eating and techniques for dealing with triggers will be explored. The purpose of this lesson is to provide patients with the opportunity to review the basic principles of the Pritikin Eating Plan, discuss the value of eating mindfully and how to measure internal cues of hunger and fullness using the Hunger Scale. Patients will also discuss reasons for non-hunger eating and learn strategies to use for controlling emotional eating.  Targeting Your Nutrition Priorities Clinical staff led group instruction and group discussion with PowerPoint presentation and patient guidebook. To enhance the learning environment the use of posters, models and videos may be added. Patients will learn how to determine  their genetic susceptibility to disease by reviewing their family history. Patients will gain insight into the importance of diet as part of an overall  healthy lifestyle in mitigating the impact of genetics and other environmental insults. The purpose of this lesson is to provide patients with the opportunity to assess their personal nutrition priorities by looking at their family history, their own health history and current risk factors. Patients will also be able to discuss ways of prioritizing and modifying the Pritikin Eating Plan for their highest risk areas  Menu  Clinical staff led group instruction and group discussion with PowerPoint presentation and patient guidebook. To enhance the learning environment the use of posters, models and videos may be added. Using menus brought in from [employer], or printed from [employer], patients will apply the Pritikin dining out guidelines that were presented in the [employer] video. Patients will also be able to practice these guidelines in a variety of provided scenarios. The purpose of this lesson is to provide patients with the opportunity to practice hands-on learning of the Pritikin Dining Out guidelines with actual menus and practice scenarios.  Label Reading Clinical staff led group instruction and group discussion with PowerPoint presentation and patient guidebook. To enhance the learning environment the use of posters, models and videos may be added. Patients will review and discuss the Pritikin label reading guidelines presented in Pritikin's Label Reading Educational series video. Using fool labels brought in from local grocery stores and markets, patients will apply the label reading guidelines and determine if the packaged food meet the Pritikin guidelines. The purpose of this lesson is to provide patients with the opportunity to review, discuss, and practice hands-on learning of the Pritikin Label Reading guidelines  with actual packaged food labels. Cooking School  Pritikin's [employer] are designed to teach patients ways to prepare quick, simple, and affordable recipes at home. The importance of nutrition's role in chronic disease risk reduction is reflected in its emphasis in the overall Pritikin program. By learning how to prepare essential core Pritikin Eating Plan recipes, patients will increase control over what they eat; be able to customize the flavor of foods without the use of added salt, sugar, or fat; and improve the quality of the food they consume. By learning a set of core recipes which are easily assembled, quickly prepared, and affordable, patients are more likely to prepare more healthy foods at home. These workshops focus on convenient breakfasts, simple entres, side dishes, and desserts which can be prepared with minimal effort and are consistent with nutrition recommendations for cardiovascular risk reduction. Cooking [employer] are taught by a Armed forces logistics/support/administrative officer (RD) who has been trained by the [employer]. The chef or RD has a clear understanding of the importance of minimizing - if not completely eliminating - added fat, sugar, and sodium in recipes. Throughout the series of Cooking School Workshop sessions, patients will learn about healthy ingredients and efficient methods of cooking to build confidence in their capability to prepare    Cooking School weekly topics:  Adding Flavor- Sodium-Free  Fast and Healthy Breakfasts  Powerhouse Plant-Based Proteins  Satisfying Salads and Dressings  Simple Sides and Sauces  International Cuisine-Spotlight on the [employer] Zones  Delicious Desserts  Savory Soups  [employer] - Meals in a Astronomer Appetizers and Snacks  Comforting Weekend Breakfasts  One-Pot Wonders   Fast Evening Meals  Landscape architect Your Pritikin Plate  WORKSHOPS   Healthy Mindset  (Psychosocial):  Focused Goals, Sustainable Changes Clinical staff led group instruction and group discussion with PowerPoint presentation and patient guidebook. To  enhance the learning environment the use of posters, models and videos may be added. Patients will be able to apply effective goal setting strategies to establish at least one personal goal, and then take consistent, meaningful action toward that goal. They will learn to identify common barriers to achieving personal goals and develop strategies to overcome them. Patients will also gain an understanding of how our mind-set can impact our ability to achieve goals and the importance of cultivating a positive and growth-oriented mind-set. The purpose of this lesson is to provide patients with a deeper understanding of how to set and achieve personal goals, as well as the tools and strategies needed to overcome common obstacles which may arise along the way.  From Head to Heart: The Power of a Healthy Outlook  Clinical staff led group instruction and group discussion with PowerPoint presentation and patient guidebook. To enhance the learning environment the use of posters, models and videos may be added. Patients will be able to recognize and describe the impact of emotions and mood on physical health. They will discover the importance of self-care and explore self-care practices which may work for them. Patients will also learn how to utilize the 4 C's to cultivate a healthier outlook and better manage stress and challenges. The purpose of this lesson is to demonstrate to patients how a healthy outlook is an essential part of maintaining good health, especially as they continue their cardiac rehab journey.  Healthy Sleep for a Healthy Heart Clinical staff led group instruction and group discussion with PowerPoint presentation and patient guidebook. To enhance the learning environment the use of posters, models and videos may be added. At the  conclusion of this workshop, patients will be able to demonstrate knowledge of the importance of sleep to overall health, well-being, and quality of life. They will understand the symptoms of, and treatments for, common sleep disorders. Patients will also be able to identify daytime and nighttime behaviors which impact sleep, and they will be able to apply these tools to help manage sleep-related challenges. The purpose of this lesson is to provide patients with a general overview of sleep and outline the importance of quality sleep. Patients will learn about a few of the most common sleep disorders. Patients will also be introduced to the concept of "sleep hygiene," and discover ways to self-manage certain sleeping problems through simple daily behavior changes. Finally, the workshop will motivate patients by clarifying the links between quality sleep and their goals of heart-healthy living.   Recognizing and Reducing Stress Clinical staff led group instruction and group discussion with PowerPoint presentation and patient guidebook. To enhance the learning environment the use of posters, models and videos may be added. At the conclusion of this workshop, patients will be able to understand the types of stress reactions, differentiate between acute and chronic stress, and recognize the impact that chronic stress has on their health. They will also be able to apply different coping mechanisms, such as reframing negative self-talk. Patients will have the opportunity to practice a variety of stress management techniques, such as deep abdominal breathing, progressive muscle relaxation, and/or guided imagery.  The purpose of this lesson is to educate patients on the role of stress in their lives and to provide healthy techniques for coping with it.  Learning Barriers/Preferences:  Learning Barriers/Preferences - 04/09/24 1429       Learning Barriers/Preferences   Learning Barriers Exercise Concerns   Unsteady  balance, 4 seconds on single leg stand   Learning  Preferences Verbal Instruction;Video;Skilled Demonstration;Group Instruction;Individual Instruction;Audio             Education Topics:  Knowledge Questionnaire Score:  Knowledge Questionnaire Score - 04/09/24 1430       Knowledge Questionnaire Score   Pre Score 24/24             Core Components/Risk Factors/Patient Goals at Admission:  Personal Goals and Risk Factors at Admission - 04/09/24 1430       Core Components/Risk Factors/Patient Goals on Admission    Weight Management Yes;Obesity;Weight Loss    Intervention Weight Management: Develop a combined nutrition and exercise program designed to reach desired caloric intake, while maintaining appropriate intake of nutrient and fiber, sodium and fats, and appropriate energy expenditure required for the weight goal.;Weight Management: Provide education and appropriate resources to help participant work on and attain dietary goals.;Weight Management/Obesity: Establish reasonable short term and long term weight goals.;Obesity: Provide education and appropriate resources to help participant work on and attain dietary goals.    Admit Weight 214 lb 15.2 oz (97.5 kg)    Expected Outcomes Short Term: Continue to assess and modify interventions until short term weight is achieved;Long Term: Adherence to nutrition and physical activity/exercise program aimed toward attainment of established weight goal;Weight Loss: Understanding of general recommendations for a balanced deficit meal plan, which promotes 1-2 lb weight loss per week and includes a negative energy balance of [phone] kcal/d;Understanding recommendations for meals to include 15-35% energy as protein, 25-35% energy from fat, 35-60% energy from carbohydrates, less than 200mg  of dietary cholesterol, 20-35 gm of total fiber daily;Understanding of distribution of calorie intake throughout the day with the consumption of 4-5 meals/snacks     Diabetes Yes    Intervention Provide education about signs/symptoms and action to take for hypo/hyperglycemia.;Provide education about proper nutrition, including hydration, and aerobic/resistive exercise prescription along with prescribed medications to achieve blood glucose in normal ranges: Fasting glucose 65-99 mg/dL    Expected Outcomes Short Term: Participant verbalizes understanding of the signs/symptoms and immediate care of hyper/hypoglycemia, proper foot care and importance of medication, aerobic/resistive exercise and nutrition plan for blood glucose control.;Long Term: Attainment of HbA1C < 7%.    Hypertension Yes    Intervention Provide education on lifestyle modifcations including regular physical activity/exercise, weight management, moderate sodium restriction and increased consumption of fresh fruit, vegetables, and low fat dairy, alcohol moderation, and smoking cessation.;Monitor prescription use compliance.    Expected Outcomes Short Term: Continued assessment and intervention until BP is < 140/1mm HG in hypertensive participants. < 130/45mm HG in hypertensive participants with diabetes, heart failure or chronic kidney disease.;Long Term: Maintenance of blood pressure at goal levels.    Lipids Yes    Intervention Provide education and support for participant on nutrition & aerobic/resistive exercise along with prescribed medications to achieve LDL 70mg , HDL >40mg .    Expected Outcomes Short Term: Participant states understanding of desired cholesterol values and is compliant with medications prescribed. Participant is following exercise prescription and nutrition guidelines.;Long Term: Cholesterol controlled with medications as prescribed, with individualized exercise RX and with personalized nutrition plan. Value goals: LDL < 70mg , HDL > 40 mg.    Stress Yes    Intervention Offer individual and/or small group education and counseling on adjustment to heart disease, stress  management and health-related lifestyle change. Teach and support self-help strategies.;Refer participants experiencing significant psychosocial distress to appropriate mental health specialists for further evaluation and treatment. When possible, include family members and significant others in education/counseling sessions.  Expected Outcomes Short Term: Participant demonstrates changes in health-related behavior, relaxation and other stress management skills, ability to obtain effective social support, and compliance with psychotropic medications if prescribed.;Long Term: Emotional wellbeing is indicated by absence of clinically significant psychosocial distress or social isolation.    Personal Goal Other Yes    Personal Goal Short:knowledge on diet, balance. Long: Under 200lbs, tone muscle    Intervention Willc ontinue to monitor pt and progress workloads as tolerated without sign or symptom    Expected Outcomes Pt will achieve his goals and gain strength             Core Components/Risk Factors/Patient Goals Review:    Core Components/Risk Factors/Patient Goals at Discharge (Final Review):    ITP Comments:  ITP Comments     Row Name 04/09/24 1214           ITP Comments Introduction to [facility]/Intensive Cardiac Rehab. Initial orientation packet reviewed with the patient                Comments: Participant attended orientation for the cardiac rehabilitation program on  04/09/2024  to perform initial intake and exercise walk test. Patient introduced to the [facility] education and orientation packet was reviewed. Completed 6-minute walk test, measurements, initial ITP, and exercise prescription. Vital signs stable. Telemetry-normal sinus rhythm BBB, asymptomatic.   Service time was from 1030 to 1250.

## 2024-04-10 ENCOUNTER — Other Ambulatory Visit: Payer: Self-pay | Admitting: *Deleted

## 2024-04-10 ENCOUNTER — Ambulatory Visit (HOSPITAL_BASED_OUTPATIENT_CLINIC_OR_DEPARTMENT_OTHER): Payer: Self-pay | Admitting: Nurse Practitioner

## 2024-04-10 DIAGNOSIS — Z79899 Other long term (current) drug therapy: Secondary | ICD-10-CM

## 2024-04-10 DIAGNOSIS — I10 Essential (primary) hypertension: Secondary | ICD-10-CM

## 2024-04-10 DIAGNOSIS — R0602 Shortness of breath: Secondary | ICD-10-CM

## 2024-04-10 LAB — BASIC METABOLIC PANEL WITH GFR
BUN/Creatinine Ratio: 18 (ref 10–24)
BUN: 31 mg/dL — ABNORMAL HIGH (ref 8–27)
CO2: 22 mmol/L (ref 20–29)
Calcium: 9.4 mg/dL (ref 8.6–10.2)
Chloride: 97 mmol/L (ref 96–106)
Creatinine, Ser: 1.75 mg/dL — ABNORMAL HIGH (ref 0.76–1.27)
Glucose: 107 mg/dL — ABNORMAL HIGH (ref 70–99)
Potassium: 4.4 mmol/L (ref 3.5–5.2)
Sodium: 139 mmol/L (ref 134–144)
eGFR: 39 mL/min/{1.73_m2} — ABNORMAL LOW (ref >59)

## 2024-04-10 MED ORDER — BUMETANIDE 2 MG PO TABS: 2.0000 mg | ORAL_TABLET | Freq: Every day | ORAL | 3 refills | Status: DC

## 2024-04-10 NOTE — Telephone Encounter (Signed)
 Pt aware of Dr Renna Cary order to increase Bumex  to 1 tablet daily and repeat BMP in 2 weeks.  Orders placed. Lab order released.  Updated RX sent into pharmacy of choice.

## 2024-04-10 NOTE — Addendum Note (Signed)
 Addended by: Thaddeus Filippo on: 04/10/2024 10:08 AM   Modules accepted: Orders

## 2024-04-13 ENCOUNTER — Encounter (HOSPITAL_COMMUNITY)

## 2024-04-14 ENCOUNTER — Telehealth: Payer: Self-pay | Admitting: Cardiology

## 2024-04-14 NOTE — Telephone Encounter (Signed)
 Spoke with pt who is reporting HR this AM after his shower was in the 40s.  A little later this morning HR was 64, BP 127/63.  He did see his PCP this AM but no EKG was completed.  Currently HR is 37 per his pulse ox.  He reports having episodes of being lightheaded and having shortness of breath sometimes when HR is low.  He walked today with PT and felt fine.  He is scheduled to start Cardiac Rehab in the AM and he was advised they will be able to check his HR there.  Advised to hold Metoprolol  tonight is HR remains lower than 60 bpm.  Advised I will forward this information to Dr Renna Cary for his review and will call back with any new orders.

## 2024-04-14 NOTE — Telephone Encounter (Signed)
 PCP office calling to give report of low HR of 40 this morning in office Not feeling well when occurs Needs instructions in regards to medications and if he should still take both metoprolol  and amiodarone  when HR is low like this Pt is starting cardiac rehab tomorrow.  Please reach out to pt per pcp request to advise.

## 2024-04-15 ENCOUNTER — Other Ambulatory Visit: Payer: Self-pay

## 2024-04-15 ENCOUNTER — Emergency Department (HOSPITAL_COMMUNITY)

## 2024-04-15 ENCOUNTER — Encounter (HOSPITAL_COMMUNITY)

## 2024-04-15 ENCOUNTER — Observation Stay (HOSPITAL_COMMUNITY)
Admission: EM | Admit: 2024-04-15 | Discharge: 2024-04-16 | Disposition: A | Discharge status: Home / Self Care | Attending: Cardiology | Admitting: Cardiology

## 2024-04-15 ENCOUNTER — Encounter (HOSPITAL_COMMUNITY): Payer: Self-pay | Admitting: Emergency Medicine

## 2024-04-15 ENCOUNTER — Encounter (HOSPITAL_COMMUNITY)
Admission: RE | Admit: 2024-04-15 | Discharge: 2024-04-15 | Disposition: A | Discharge status: Home / Self Care | Source: Ambulatory Visit | Attending: Cardiology | Admitting: Cardiology

## 2024-04-15 DIAGNOSIS — I129 Hypertensive chronic kidney disease with stage 1 through stage 4 chronic kidney disease, or unspecified chronic kidney disease: Secondary | ICD-10-CM | POA: Insufficient documentation

## 2024-04-15 DIAGNOSIS — E1122 Type 2 diabetes mellitus with diabetic chronic kidney disease: Secondary | ICD-10-CM | POA: Insufficient documentation

## 2024-04-15 DIAGNOSIS — N1832 Chronic kidney disease, stage 3b: Secondary | ICD-10-CM | POA: Insufficient documentation

## 2024-04-15 DIAGNOSIS — I495 Sick sinus syndrome: Secondary | ICD-10-CM | POA: Diagnosis not present

## 2024-04-15 DIAGNOSIS — Z96652 Presence of left artificial knee joint: Secondary | ICD-10-CM | POA: Insufficient documentation

## 2024-04-15 DIAGNOSIS — R001 Bradycardia, unspecified: Secondary | ICD-10-CM

## 2024-04-15 DIAGNOSIS — I480 Paroxysmal atrial fibrillation: Secondary | ICD-10-CM | POA: Diagnosis not present

## 2024-04-15 DIAGNOSIS — Z951 Presence of aortocoronary bypass graft: Secondary | ICD-10-CM | POA: Diagnosis not present

## 2024-04-15 DIAGNOSIS — Z96643 Presence of artificial hip joint, bilateral: Secondary | ICD-10-CM | POA: Insufficient documentation

## 2024-04-15 DIAGNOSIS — I2510 Atherosclerotic heart disease of native coronary artery without angina pectoris: Secondary | ICD-10-CM | POA: Diagnosis not present

## 2024-04-15 DIAGNOSIS — R0602 Shortness of breath: Secondary | ICD-10-CM | POA: Diagnosis not present

## 2024-04-15 DIAGNOSIS — Z955 Presence of coronary angioplasty implant and graft: Secondary | ICD-10-CM | POA: Diagnosis not present

## 2024-04-15 LAB — COMPREHENSIVE METABOLIC PANEL WITH GFR
ALT: 28 U/L (ref 0–44)
AST: 36 U/L (ref 15–41)
Albumin: 3.6 g/dL (ref 3.5–5.0)
Alkaline Phosphatase: 44 U/L (ref 38–126)
Anion gap: 11 (ref 5–15)
BUN: 39 mg/dL — ABNORMAL HIGH (ref 8–23)
CO2: 22 mmol/L (ref 22–32)
Calcium: 9.1 mg/dL (ref 8.9–10.3)
Chloride: 99 mmol/L (ref 98–111)
Creatinine, Ser: 1.99 mg/dL — ABNORMAL HIGH (ref 0.61–1.24)
GFR, Estimated: 33 mL/min — ABNORMAL LOW (ref >60)
Glucose, Bld: 135 mg/dL — ABNORMAL HIGH (ref 70–99)
Potassium: 4.5 mmol/L (ref 3.5–5.1)
Sodium: 132 mmol/L — ABNORMAL LOW (ref 135–145)
Total Bilirubin: 0.6 mg/dL (ref 0.0–1.2)
Total Protein: 6.8 g/dL (ref 6.5–8.1)

## 2024-04-15 LAB — I-STAT CHEM 8, ED
BUN: 38 mg/dL — ABNORMAL HIGH (ref 8–23)
Calcium, Ion: 1.15 mmol/L (ref 1.15–1.40)
Chloride: 98 mmol/L (ref 98–111)
Creatinine, Ser: 2.1 mg/dL — ABNORMAL HIGH (ref 0.61–1.24)
Glucose, Bld: 133 mg/dL — ABNORMAL HIGH (ref 70–99)
HCT: 37 % — ABNORMAL LOW (ref 39.0–52.0)
Hemoglobin: 12.6 g/dL — ABNORMAL LOW (ref 13.0–17.0)
Potassium: 4.5 mmol/L (ref 3.5–5.1)
Sodium: 134 mmol/L — ABNORMAL LOW (ref 135–145)
TCO2: 22 mmol/L (ref 22–32)

## 2024-04-15 LAB — CBC WITH DIFFERENTIAL/PLATELET
Abs Immature Granulocytes: 0.03 10*3/uL (ref 0.00–0.07)
Basophils Absolute: 0.1 10*3/uL (ref 0.0–0.1)
Basophils Relative: 1 %
Eosinophils Absolute: 0.1 10*3/uL (ref 0.0–0.5)
Eosinophils Relative: 1 %
HCT: 38.1 % — ABNORMAL LOW (ref 39.0–52.0)
Hemoglobin: 12 g/dL — ABNORMAL LOW (ref 13.0–17.0)
Immature Granulocytes: 0 %
Lymphocytes Relative: 31 %
Lymphs Abs: 2.5 10*3/uL (ref 0.7–4.0)
MCH: 29 pg (ref 26.0–34.0)
MCHC: 31.5 g/dL (ref 30.0–36.0)
MCV: 92 fL (ref 80.0–100.0)
Monocytes Absolute: 0.6 10*3/uL (ref 0.1–1.0)
Monocytes Relative: 8 %
Neutro Abs: 4.8 10*3/uL (ref 1.7–7.7)
Neutrophils Relative %: 59 %
Platelets: 169 10*3/uL (ref 150–400)
RBC: 4.14 MIL/uL — ABNORMAL LOW (ref 4.22–5.81)
RDW: 15.1 % (ref 11.5–15.5)
WBC: 8.1 10*3/uL (ref 4.0–10.5)
nRBC: 0 % (ref 0.0–0.2)

## 2024-04-15 LAB — TROPONIN I (HIGH SENSITIVITY)
Troponin I (High Sensitivity): 32 ng/L — ABNORMAL HIGH (ref <18)
Troponin I (High Sensitivity): 27 ng/L — ABNORMAL HIGH (ref <18)

## 2024-04-15 LAB — CBG MONITORING, ED: Glucose-Capillary: 207 mg/dL — ABNORMAL HIGH (ref 70–99)

## 2024-04-15 LAB — GLUCOSE, CAPILLARY: Glucose-Capillary: 167 mg/dL — ABNORMAL HIGH (ref 70–99)

## 2024-04-15 MED ORDER — AMIODARONE HCL 200 MG PO TABS
200.0000 mg | ORAL_TABLET | Freq: Every day | ORAL | Status: DC
  Administered 2024-04-16: 200 mg via ORAL
  Filled 2024-04-15: qty 1

## 2024-04-15 MED ORDER — HEPARIN (PORCINE) 25000 UT/250ML-% IV SOLN
1350.0000 [IU]/h | INTRAVENOUS | Status: AC
  Administered 2024-04-15: 1350 [IU]/h via INTRAVENOUS
  Filled 2024-04-15: qty 250

## 2024-04-15 MED ORDER — FAMOTIDINE 20 MG PO TABS: 20.0000 mg | ORAL_TABLET | Freq: Every day | ORAL | Status: DC | PRN

## 2024-04-15 MED ORDER — LEVOTHYROXINE SODIUM 75 MCG PO TABS
75.0000 ug | ORAL_TABLET | Freq: Every day | ORAL | Status: DC
  Administered 2024-04-16: 75 ug via ORAL
  Filled 2024-04-15: qty 1

## 2024-04-15 MED ORDER — INSULIN ASPART 100 UNIT/ML IJ SOLN
0.0000 [IU] | Freq: Three times a day (TID) | INTRAMUSCULAR | Status: DC
  Administered 2024-04-15: 3 [IU] via SUBCUTANEOUS

## 2024-04-15 MED ORDER — ATORVASTATIN CALCIUM 40 MG PO TABS
40.0000 mg | ORAL_TABLET | Freq: Every day | ORAL | Status: DC
  Administered 2024-04-15: 40 mg via ORAL
  Filled 2024-04-15: qty 1

## 2024-04-15 MED ORDER — ASPIRIN 81 MG PO TBEC
81.0000 mg | DELAYED_RELEASE_TABLET | Freq: Every day | ORAL | Status: DC
  Administered 2024-04-16: 81 mg via ORAL
  Filled 2024-04-15: qty 1

## 2024-04-15 MED ORDER — ACETAMINOPHEN 500 MG PO TABS: 500.0000 mg | ORAL_TABLET | Freq: Four times a day (QID) | ORAL | Status: DC | PRN

## 2024-04-15 NOTE — Progress Notes (Addendum)
 PHARMACY - ANTICOAGULATION CONSULT NOTE  Pharmacy Consult for heparin   Indication: atrial fibrillation  No Known Allergies  Patient Measurements: Heparin  dosing weight 97.2 kg     Vital Signs: Temp: 97.5 F (36.4 C) (05/21 1012) Temp Source: Oral (05/21 1012) BP: 130/69 (05/21 1245) Pulse Rate: 32 (05/21 1245)  Labs: Recent Labs    04/15/24 1021 04/15/24 1031  HGB 12.0* 12.6*  HCT 38.1* 37.0*  PLT 169  --   CREATININE 1.99* 2.10*  TROPONINIHS 32*  --     Estimated Creatinine Clearance: 33.4 mL/min (A) (by C-G formula based on SCr of 2.1 mg/dL (H)).   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 Patient on Eliquis  PTA, last dose 07:30. HgB 12.0 and PLTs 169. Pharmacy consulted to dose heparin .   Goal of Therapy:  Heparin  level 0.3-0.7 units/ml Monitor platelets by anticoagulation protocol: Yes   Plan:  No bolus with recent DOAC intake.  Start heparin  infusion at 1350 units/hr at 19:30pm.  Check anti-Xa level in 8 hours and daily while on heparin  - utilize aPTT for now given recent DOAC intake.  Continue to monitor H&H and platelets Hold heparin  starting at 05:00 for potential pacemaker.   Mamie Searles, PharmD, BCCCP  04/15/2024,1:42 PM  ADDENDUM:  Heparin  charted as held per provider, clarified with EP. Ok to start heparin  and hold at 05:00 for possible pacemaker, will be approx 7 hours  after heparin  start so not appropriate to check aPTT yet. F/u  aPTT check or resume Eliquis  after heparin  hold on 5/22.   Mamie Searles, PharmD, BCCCP

## 2024-04-15 NOTE — ED Provider Notes (Signed)
 [place] EMERGENCY DEPARTMENT AT [facility] Provider Note   CSN: [id number] Arrival date & time: 04/15/24  1007     History  Chief Complaint  Patient presents with   Bradycardia    Husain Costabile is a 83 y.o. male.  HPI 83 year old male presents with low heart rate.  Patient states that his heart rate was low yesterday when he was at the doctor's office.  It was in the 30s.  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  He reports no recent leg swelling.  He denies any chest pain.  He had a little bit of sinus congestion a few days ago but that is resolved.  No recent vomiting or diarrhea.  His cardiology office told him to not take metoprolol  due to the bradycardia, but he takes this in the morning and already took his dose yesterday.  He has not taken any today.  He denies any syncope but has been feeling lightheaded.  Due to his continued bradycardia today, EMS was called and he was sent into the hospital.  Home Medications Prior to Admission medications   Medication Sig Start Date End Date Taking? Authorizing Provider  acetaminophen  (TYLENOL ) 500 MG tablet Take 1-2 tablets (500-1,000 mg total) by mouth every 6 (six) hours as needed. 01/25/24   Barrett, Erin R, PA-C  amiodarone  (PACERONE ) 200 MG tablet Take 1 tablet (200 mg total) by mouth daily. 03/12/24   Meng, Hao, PA  amoxicillin (AMOXIL) 500 MG capsule Take 2,000 mg by mouth See admin instructions. As needed for dental procedure 03/27/19   [provider]  apixaban  (ELIQUIS ) 5 MG TABS tablet Take 1 tablet (5 mg total) by mouth 2 (two) times daily. 02/26/24   Hugh Madura, MD  aspirin  EC 81 MG tablet Take 81 mg by mouth daily.    [provider]  atorvastatin  (LIPITOR) 40 MG tablet Take 1 tablet (40 mg total) by mouth at bedtime. 01/25/24   Barrett, Erin R, PA-C  bumetanide  (BUMEX ) 2 MG tablet Take 1  tablet (2 mg total) by mouth daily. 04/10/24   Hugh Madura, MD  famotidine  (PEPCID ) 20 MG tablet Take 1 tablet (20 mg total) by mouth daily. Patient taking differently: Take 20 mg by mouth daily as needed for heartburn or indigestion. 02/05/22   Kennedy-Smith, Colleen M, NP  fluticasone  (FLONASE ) 50 MCG/ACT nasal spray Place 1 spray into both nostrils daily as needed for allergies. 08/08/17   [provider]  Inulin (FIBER CHOICE PO) Take 2 tablets by mouth daily.    [provider]  JANUVIA 100 MG tablet Take 100 mg by mouth daily. 08/19/21   [provider]  levothyroxine  (SYNTHROID ) 75 MCG tablet Take 75 mcg by mouth daily before breakfast. 04/12/20   [provider]  Magnesium  500 MG CAPS Take 1,000 mg by mouth daily.    [provider]  metFORMIN  (GLUCOPHAGE ) 1000 MG tablet Take 1 tablet by mouth 2 (two) times daily. 04/30/22   [provider]  metoprolol  succinate (TOPROL -XL) 50 MG 24 hr tablet Take 1 tablet (50 mg total) by mouth daily. Take with or immediately following a meal. 03/12/24 06/10/24  Ervin Heath, PA  NONFORMULARY OR COMPOUNDED ITEM Topical antifungal topical solution: terbinafine 3%, fluconazole 2%, tea tree oil 5%, Urea 10%, ibuprofen2% in DMSO suspension. Sig: Apply to the affected toenail(s) once daily.  Order sent to [facility] [address] [place], [place] [address] 01/16/24   Luella Sager, DPM  Omega-3 Fatty Acids (OMEGA-3 FISH OIL PO) Take 4,000 mg by mouth at bedtime.    [provider]  ONE TOUCH ULTRA TEST test strip USE 1 TO 2 TIMES A DAY Patient not taking: Reported on 04/09/2024 08/29/18   [provider]  Probiotic Product (PROBIOTIC DAILY PO) Take 1 capsule by mouth daily.    [provider]      Allergies    Patient has no known allergies.    Review of Systems   Review of Systems  Constitutional:  Negative for fever.  Respiratory:  Positive for shortness of  breath.   Cardiovascular:  Negative for chest pain and leg swelling.  Gastrointestinal:  Negative for vomiting.  Neurological:  Positive for light-headedness. Negative for syncope.    Physical Exam Updated Vital Signs BP 130/69   Pulse (!) 32   Temp (!) 97.5 F (36.4 C) (Oral)   Resp 18   SpO2 100%  Physical Exam Vitals and nursing note reviewed.  Constitutional:      General: He is not in acute distress.    Appearance: He is well-developed. He is not ill-appearing or diaphoretic.  HENT:     Head: Normocephalic and atraumatic.  Cardiovascular:     Rate and Rhythm: Regular rhythm. Bradycardia present.     Heart sounds: Normal heart sounds.  Pulmonary:     Effort: Pulmonary effort is normal.     Breath sounds: Normal breath sounds.  Abdominal:     General: There is no distension.     Palpations: Abdomen is soft.     Tenderness: There is no abdominal tenderness.  Musculoskeletal:     Right lower leg: No edema.     Left lower leg: No edema.  Skin:    General: Skin is warm and dry.  Neurological:     Mental Status: He is alert.     ED Results / Procedures / Treatments   Labs (all labs ordered are listed, but only abnormal results are displayed) Labs Reviewed  COMPREHENSIVE METABOLIC PANEL WITH GFR - Abnormal; Notable for the following components:      Result Value   Sodium 132 (*)    Glucose, Bld 135 (*)    BUN 39 (*)    Creatinine, Ser 1.99 (*)    GFR, Estimated 33 (*)    All other components within normal limits  CBC WITH DIFFERENTIAL/PLATELET - Abnormal; Notable for the following components:   RBC 4.14 (*)    Hemoglobin 12.0 (*)    HCT 38.1 (*)    All other components within normal limits  I-STAT CHEM 8, ED - Abnormal; Notable for the following components:   Sodium 134 (*)    BUN 38 (*)    Creatinine, Ser 2.10 (*)    Glucose, Bld 133 (*)    Hemoglobin 12.6 (*)    HCT 37.0 (*)    All other components within normal limits  TROPONIN I (HIGH SENSITIVITY) -  Abnormal; Notable for the following components:   Troponin I (High Sensitivity) 32 (*)    All other components within normal limits  TROPONIN I (HIGH SENSITIVITY) - Abnormal; Notable for the following components:   Troponin I (High Sensitivity) 27 (*)    All other components within normal limits  BRAIN NATRIURETIC PEPTIDE    EKG EKG Interpretation Date/Time:  Wednesday Apr 15 2024 10:15:52 EDT Ventricular  Rate:  33 PR Interval:    QRS Duration:  158 QT Interval:  552 QTC Calculation: 409 R Axis:   93  Text Interpretation: Junctional rhythm Right bundle branch block Confirmed by Jerilynn Montenegro [phone]) on 04/15/2024 10:27:51 AM  Radiology DG Chest Portable 1 View Result Date: 04/15/2024 CLINICAL DATA:  Shortness of breath. EXAM: PORTABLE CHEST 1 VIEW COMPARISON:  Chest radiograph dated 04/07/2024. FINDINGS: Small left pleural effusion and left lung base atelectasis. The right lung is clear. No pneumothorax. Stable cardiomegaly. Median sternotomy wires and CABG vascular clips. No acute osseous pathology. IMPRESSION: Small left pleural effusion and left lung base atelectasis. Electronically Signed   By: Angus Bark M.D.   On: 04/15/2024 11:37    Procedures Procedures    Medications Ordered in ED Medications  heparin  ADULT infusion 100 units/mL (25000 units/250mL) (has no administration in time range)    ED Course/ Medical Decision Making/ A&P                                 Medical Decision Making Amount and/or Complexity of Data Reviewed Labs: ordered.    Details: Mild troponin elevations, flat Radiology: ordered and independent interpretation performed.    Details: No pulmonary edema ECG/medicine tests: ordered and independent interpretation performed.    Details: Junctional bradycardia  Risk Decision regarding hospitalization.   Patient presents with symptomatic bradycardia.  He is not in distress and his blood pressures are okay.  Cardiology consulted and  electrophysiology will admit and washout his beta-blocker but also consider pacemaker.  He is otherwise stable.        Final Clinical Impression(s) / ED Diagnoses Final diagnoses:  Symptomatic bradycardia    Rx / DC Orders ED Discharge Orders     None         Jerilynn Montenegro, MD 04/15/24 1506

## 2024-04-15 NOTE — ED Triage Notes (Signed)
 Pt here from cardiac rehab with c/o low heart rate , rate found to have a HR of 33 , , pt is on lopressor  but did not take it this morning , pt does state that he is tored and weak and sob with activity

## 2024-04-15 NOTE — Progress Notes (Signed)
 Cardiac Individual Treatment Plan  Patient Details  Name: Darin Mcclure MRN: [id number] Date of Birth: [birth year] Referring Provider:   Flowsheet Row INTENSIVE CARDIAC REHAB ORIENT from 04/09/2024 in [facility]  Referring Provider Dr. 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 As needed for dental procedure, Disp: , Rfl:    apixaban  (ELIQUIS ) 5 MG TABS tablet, Take 1 tablet (5 mg total) by mouth 2 (two) times daily., Disp: 180 tablet, Rfl: 3   aspirin  EC 81 MG tablet, Take 81 mg by mouth daily., Disp: , Rfl:    atorvastatin  (LIPITOR) 40 MG tablet, Take 1 tablet (40 mg total) by mouth at bedtime., Disp: 30 tablet, Rfl: 3   bumetanide  (BUMEX ) 2 MG tablet, Take 1 tablet (2 mg total) by mouth daily., Disp: 90 tablet, Rfl: 3   famotidine  (PEPCID ) 20 MG tablet, Take 1 tablet (20 mg total) by mouth daily. (Patient taking differently: Take 20 mg by mouth daily as needed for heartburn or indigestion.), Disp: 30 tablet, Rfl: 1   fluticasone  (FLONASE ) 50 MCG/ACT nasal spray, Place 1 spray into both nostrils daily as needed for allergies., Disp: , Rfl: 3   Inulin (FIBER CHOICE PO), Take 2 tablets by mouth daily., Disp: , Rfl:    JANUVIA 100 MG tablet, Take 100 mg by mouth daily., Disp: , Rfl:     levothyroxine  (SYNTHROID ) 75 MCG tablet, Take 75 mcg by mouth daily before breakfast., Disp: , Rfl:    Magnesium  500 MG CAPS, Take 1,000 mg by mouth daily., Disp: , Rfl:    metFORMIN  (GLUCOPHAGE ) 1000 MG tablet, Take 1 tablet by mouth 2 (two) times daily., Disp: , Rfl:    metoprolol  succinate (TOPROL -XL) 50 MG 24 hr tablet, Take 1 tablet (50 mg total) by mouth daily. Take with or immediately following a meal., Disp: 90 tablet, Rfl: 3   NONFORMULARY OR COMPOUNDED ITEM, Topical antifungal topical solution: terbinafine 3%, fluconazole 2%, tea tree oil 5%, Urea 10%, ibuprofen2% in DMSO suspension. Sig: Apply to the affected toenail(s) once daily.  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) 08/26/2017   GERD (gastroesophageal reflux disease)    Heart disease    Hyperglycemia    Hypertension    Mixed hyperlipidemia    Muscle cramp 08/26/2017   Neuropathy    Osteoarthritis    RBBB    RBBB (right bundle branch block)    Type 2 diabetes mellitus (HCC)     Tobacco Use: Social History   Tobacco Use  Smoking Status Never  Smokeless Tobacco Never    Labs: Review Flowsheet  More data exists      Latest Ref Rng & Units 01/14/2024 01/16/2024 01/20/2024 01/21/2024 01/23/2024  Labs for ITP Cardiac and Pulmonary Rehab  Cholestrol 0 - 200 mg/dL - 098  - - -  LDL (calc) 0 - 99 mg/dL -  16  - - -  HDL-C >11 mg/dL - 35  - - -  Trlycerides <150 mg/dL - 914  - - -  Hemoglobin A1c 4.8 - 5.6 % 7.1  - - - -  PH, Arterial 7.35 - 7.45 - - 7.43  7.308  7.289  7.346  7.359  7.302  7.330  7.339  -  PCO2 arterial 32 - 48 mmHg - - 35  41.7  38.9  29.9  33.7  41.2  40.6  42.5  -  Bicarbonate 20.0 - 28.0 mmol/L - - 23.2  20.8  18.6  16.4  19.0  20.4  21.4  22.9  -  TCO2 22 - 32 mmol/L - - - 22  20  17  20  22  25  23  22  23  23  24  24  22    Acid-base deficit 0.0 - 2.0 mmol/L - - 0.6  5.0  7.0  8.0  6.0  6.0  4.0  3.0  -  O2 Saturation % - - 98.7  96  96  97  97  95  100  100  -    Details       Multiple values from one day are sorted in reverse-chronological order         Capillary Blood Glucose: Lab Results  Component Value Date   GLUCAP 167 (H) 04/15/2024   GLUCAP 121 (H) 04/09/2024   GLUCAP 126 (H) 03/06/2024   GLUCAP 139 (H) 03/06/2024   GLUCAP 109 (H) 03/05/2024     Exercise Target Goals: Exercise Program Goal: Individual exercise prescription set using results from initial 6 min walk test and THRR while considering  patient's activity barriers and safety.   Exercise Prescription Goal: Initial exercise prescription builds to 30-45 minutes a day of aerobic activity, 2-3 days per week.  Home exercise guidelines will be given to patient during program as part of exercise prescription that the participant will acknowledge.  Activity Barriers & Risk Stratification:  Activity Barriers & Cardiac Risk Stratification - 04/09/24 1417       Activity Barriers & Cardiac Risk Stratification   Activity Barriers Joint Problems;Deconditioning;Arthritis;Back Problems;Shortness of Breath;Left Hip Replacement;Right Hip Replacement;Left Knee Replacement;Neck/Spine Problems;Balance Concerns    Cardiac Risk Stratification High             6 Minute Walk:  6 Minute Walk     Row Name 04/09/24 1414         6 Minute Walk   Phase Initial     Distance 790 feet     Walk Time  6 minutes     # of Rest Breaks 0     MPH 1.5  METS 1.2     RPE 12     Perceived Dyspnea  1     VO2 Peak 4.31     Symptoms No     Resting HR 55 bpm     Resting BP 114/84     Resting Oxygen Saturation  98 %     Exercise Oxygen Saturation  during 6 min walk 97 %     Max Ex. HR 73 bpm     Max Ex. BP 152/64     2 Minute Post BP 140/64              Oxygen Initial Assessment:   Oxygen Re-Evaluation:   Oxygen Discharge (Final Oxygen Re-Evaluation):   Initial Exercise Prescription:  Initial Exercise Prescription - 04/09/24 1400       Date of Initial Exercise RX and Referring Provider   Date 04/09/24    Referring Provider Dr. Dorothye Gathers, MD    Expected Discharge Date 07/01/24      NuStep   Level 1    SPM 65    Minutes 20    METs 1.3      Prescription Details   Frequency (times per week) 3    Duration Progress to 30 minutes of continuous aerobic without signs/symptoms of physical distress      Intensity   THRR 40-80% of Max Heartrate 56-111    Ratings of Perceived Exertion 11-13    Perceived Dyspnea 0-4      Progression   Progression Continue progressive overload as per policy without signs/symptoms or physical distress.      Resistance Training   Training Prescription Yes    Weight 2    Reps 10-15             Perform Capillary Blood Glucose checks as needed.  Exercise Prescription Changes:   Exercise Comments:   Exercise Goals and Review:   Exercise Goals     Row Name 04/09/24 1419             Exercise Goals   Increase Physical Activity Yes       Intervention Provide advice, education, support and counseling about physical activity/exercise needs.;Develop an individualized exercise prescription for aerobic and resistive training based on initial evaluation findings, risk stratification, comorbidities and participant's personal goals.       Expected Outcomes Short Term: Attend rehab on a regular basis to increase amount of physical  activity.;Long Term: Add in home exercise to make exercise part of routine and to increase amount of physical activity.;Long Term: Exercising regularly at least 3-5 days a week.       Increase Strength and Stamina Yes       Intervention Provide advice, education, support and counseling about physical activity/exercise needs.;Develop an individualized exercise prescription for aerobic and resistive training based on initial evaluation findings, risk stratification, comorbidities and participant's personal goals.       Expected Outcomes Short Term: Increase workloads from initial exercise prescription for resistance, speed, and METs.;Short Term: Perform resistance training exercises routinely during rehab and add in resistance training at home;Long Term: Improve cardiorespiratory fitness, muscular endurance and strength as measured by increased METs and functional capacity ( )       Able to understand and use rate of perceived exertion (RPE) scale Yes       Intervention Provide education and explanation on how to use RPE scale       Expected Outcomes Short Term: Able to use RPE daily in rehab to  express subjective intensity level;Long Term:  Able to use RPE to guide intensity level when exercising independently       Able to understand and use Dyspnea scale Yes       Intervention Provide education and explanation on how to use Dyspnea scale       Expected Outcomes Short Term: Able to use Dyspnea scale daily in rehab to express subjective sense of shortness of breath during exertion;Long Term: Able to use Dyspnea scale to guide intensity level when exercising independently       Knowledge and understanding of Target Heart Rate Range (THRR) Yes       Intervention Provide education and explanation of THRR including how the numbers were predicted and where they are located for reference       Expected Outcomes Short Term: Able to state/look up THRR;Long Term: Able to use THRR to govern intensity when  exercising independently;Short Term: Able to use daily as guideline for intensity in rehab       Understanding of Exercise Prescription Yes       Intervention Provide education, explanation, and written materials on patient's individual exercise prescription       Expected Outcomes Short Term: Able to explain program exercise prescription;Long Term: Able to explain home exercise prescription to exercise independently                Exercise Goals Re-Evaluation :   Discharge Exercise Prescription (Final Exercise Prescription Changes):   Nutrition:  Target Goals: Understanding of nutrition guidelines, daily intake of sodium 1500mg , cholesterol 200mg , calories 30% from fat and 7% or less from saturated fats, daily to have 5 or more servings of fruits and vegetables.  Biometrics:  Pre Biometrics - 04/09/24 1420       Pre Biometrics   Height 6' (1.829 m)    Weight 97.5 kg    Waist Circumference 43 inches    Hip Circumference 43 inches    Waist to Hip Ratio 1 %    BMI (Calculated) 29.15    Triceps Skinfold 18 mm    % Body Fat 30.3 %    Grip Strength 30 kg    Flexibility 7 in    Single Leg Stand 4 seconds              Nutrition Therapy Plan and Nutrition Goals:   Nutrition Assessments:  MEDIFICTS Score Key: >=70 Need to make dietary changes  40-70 Heart Healthy Diet <= 40 Therapeutic Level Cholesterol Diet    Picture Your Plate Scores: <16 Unhealthy dietary pattern with much room for improvement. 41-50 Dietary pattern unlikely to meet recommendations for good health and room for improvement. 51-60 More healthful dietary pattern, with some room for improvement.  >60 Healthy dietary pattern, although there may be some specific behaviors that could be improved.    Nutrition Goals Re-Evaluation:   Nutrition Goals Re-Evaluation:   Nutrition Goals Discharge (Final Nutrition Goals Re-Evaluation):   Psychosocial: Target Goals: Acknowledge presence or absence  of significant depression and/or stress, maximize coping skills, provide positive support system. Participant is able to verbalize types and ability to use techniques and skills needed for reducing stress and depression.  Initial Review & Psychosocial Screening:  Initial Psych Review & Screening - 04/09/24 1112       Initial Review   Current issues with Current Stress Concerns    Source of Stress Concerns Chronic Illness    Comments Takeo has felt some stress regarding his recent open heart surgery.  Antionne said that he overdid it this past weekend and was concerned about gaining weight. Morio says he has been experiening some shortness of breath since surgery.      Family Dynamics   Good Support System? Yes   [place] has his wife Bobbye Burrow 3 children and 4 gnad children for support     Barriers   Psychosocial barriers to participate in program The patient should benefit from training in stress management and relaxation.      Screening Interventions   Interventions Encouraged to exercise;Provide feedback about the scores to participant    Expected Outcomes Long Term Goal: Stressors or current issues are controlled or eliminated.;Short Term goal: Identification and review with participant of any Quality of Life or Depression concerns found by scoring the questionnaire.;Long Term goal: The participant improves quality of Life and PHQ9 Scores as seen by post scores and/or verbalization of changes             Quality of Life Scores:  Quality of Life - 04/09/24 1425       Quality of Life   Select Quality of Life      Quality of Life Scores   Health/Function Pre 21.03 %    Socioeconomic Pre 25.75 %    Psych/Spiritual Pre 24.86 %    Family Pre 24 %    GLOBAL Pre 23.15 %            Scores of 19 and below usually indicate a poorer quality of life in these areas.  A difference of  2-3 points is a clinically meaningful difference.  A difference of 2-3 points in the total score of the Quality of  Life Index has been associated with significant improvement in overall quality of life, self-image, physical symptoms, and general health in studies assessing change in quality of life.  PHQ-9: Review Flowsheet       04/09/2024  Depression screen PHQ 2/9  Decreased Interest 0  Down, Depressed, Hopeless 0  PHQ - 2 Score 0  Altered sleeping 0  Tired, decreased energy 1  Change in appetite 1  Feeling bad or failure about yourself  0  Trouble concentrating 0  Moving slowly or fidgety/restless 1  Suicidal thoughts 0  PHQ-9 Score 3  Difficult doing work/chores Somewhat difficult   Interpretation of Total Score  Total Score Depression Severity:  1-4 = Minimal depression, 5-9 = Mild depression, 10-14 = Moderate depression, 15-19 = Moderately severe depression, 20-27 = Severe depression   Psychosocial Evaluation and Intervention:   Psychosocial Re-Evaluation:   Psychosocial Discharge (Final Psychosocial Re-Evaluation):   Vocational Rehabilitation: Provide vocational rehab assistance to qualifying candidates.   Vocational Rehab Evaluation & Intervention:  Vocational Rehab - 04/09/24 1217       Initial Vocational Rehab Evaluation & Intervention   Assessment shows need for Vocational Rehabilitation No   Tzvi is retired and does not need vocational rehab at this time            Education: Education Goals: Education classes will be provided on a weekly basis, covering required topics. Participant will state understanding/return demonstration of topics presented.     Core Videos: Exercise    Move It!  Clinical staff conducted group or individual video education with verbal and written material and guidebook.  Patient learns the recommended Pritikin exercise program. Exercise with the goal of living a long, healthy life. Some of the health benefits of exercise include controlled diabetes, healthier blood pressure levels, improved cholesterol levels, improved  heart and lung  capacity, improved sleep, and better body composition. Everyone should speak with their doctor before starting or changing an exercise routine.  Biomechanical Limitations Clinical staff conducted group or individual video education with verbal and written material and guidebook.  Patient learns how biomechanical limitations can impact exercise and how we can mitigate and possibly overcome limitations to have an impactful and balanced exercise routine.  Body Composition Clinical staff conducted group or individual video education with verbal and written material and guidebook.  Patient learns that body composition (ratio of muscle mass to fat mass) is a key component to assessing overall fitness, rather than body weight alone. Increased fat mass, especially visceral belly fat, can put us  at increased risk for metabolic syndrome, type 2 diabetes, heart disease, and even death. It is recommended to combine diet and exercise (cardiovascular and resistance training) to improve your body composition. Seek guidance from your physician and exercise physiologist before implementing an exercise routine.  Exercise Action Plan Clinical staff conducted group or individual video education with verbal and written material and guidebook.  Patient learns the recommended strategies to achieve and enjoy long-term exercise adherence, including variety, self-motivation, self-efficacy, and positive decision making. Benefits of exercise include fitness, good health, weight management, more energy, better sleep, less stress, and overall well-being.  Medical   Heart Disease Risk Reduction Clinical staff conducted group or individual video education with verbal and written material and guidebook.  Patient learns our heart is our most vital organ as it circulates oxygen, nutrients, white blood cells, and hormones throughout the entire body, and carries waste away. Data supports a plant-based eating plan like the Pritikin  Program for its effectiveness in slowing progression of and reversing heart disease. The video provides a number of recommendations to address heart disease.   Metabolic Syndrome and Belly Fat  Clinical staff conducted group or individual video education with verbal and written material and guidebook.  Patient learns what metabolic syndrome is, how it leads to heart disease, and how one can reverse it and keep it from coming back. You have metabolic syndrome if you have 3 of the following 5 criteria: abdominal obesity, high blood pressure, high triglycerides, low HDL cholesterol, and high blood sugar.  Hypertension and Heart Disease Clinical staff conducted group or individual video education with verbal and written material and guidebook.  Patient learns that high blood pressure, or hypertension, is very common in the [place] . Hypertension is largely due to excessive salt intake, but other important risk factors include being overweight, physical inactivity, drinking too much alcohol, smoking, and not eating enough potassium from fruits and vegetables. High blood pressure is a leading risk factor for heart attack, stroke, congestive heart failure, dementia, kidney failure, and premature death. Long-term effects of excessive salt intake include stiffening of the arteries and thickening of heart muscle and organ damage. Recommendations include ways to reduce hypertension and the risk of heart disease.  Diseases of Our Time - Focusing on Diabetes Clinical staff conducted group or individual video education with verbal and written material and guidebook.  Patient learns why the best way to stop diseases of our time is prevention, through food and other lifestyle changes. Medicine (such as prescription pills and surgeries) is often only a Band-Aid on the problem, not a long-term solution. Most common diseases of our time include obesity, type 2 diabetes, hypertension, heart disease, and cancer. The  Pritikin Program is recommended and has been proven to help reduce, reverse, and/or prevent the  damaging effects of metabolic syndrome.  Nutrition   Overview of the Pritikin Eating Plan  Clinical staff conducted group or individual video education with verbal and written material and guidebook.  Patient learns about the Pritikin Eating Plan for disease risk reduction. The Pritikin Eating Plan emphasizes a wide variety of unrefined, minimally-processed carbohydrates, like fruits, vegetables, whole grains, and legumes. Go, Caution, and Stop food choices are explained. Plant-based and lean animal proteins are emphasized. Rationale provided for low sodium intake for blood pressure control, low added sugars for blood sugar stabilization, and low added fats and oils for coronary artery disease risk reduction and weight management.  Calorie Density  Clinical staff conducted group or individual video education with verbal and written material and guidebook.  Patient learns about calorie density and how it impacts the Pritikin Eating Plan. Knowing the characteristics of the food you choose will help you decide whether those foods will lead to weight gain or weight loss, and whether you want to consume more or less of them. Weight loss is usually a side effect of the Pritikin Eating Plan because of its focus on low calorie-dense foods.  Label Reading  Clinical staff conducted group or individual video education with verbal and written material and guidebook.  Patient learns about the Pritikin recommended label reading guidelines and corresponding recommendations regarding calorie density, added sugars, sodium content, and whole grains.  Dining Out - Part 1  Clinical staff conducted group or individual video education with verbal and written material and guidebook.  Patient learns that restaurant meals can be sabotaging because they can be so high in calories, fat, sodium, and/or sugar. Patient learns  recommended strategies on how to positively address this and avoid unhealthy pitfalls.  Facts on Fats  Clinical staff conducted group or individual video education with verbal and written material and guidebook.  Patient learns that lifestyle modifications can be just as effective, if not more so, as many medications for lowering your risk of heart disease. A Pritikin lifestyle can help to reduce your risk of inflammation and atherosclerosis (cholesterol build-up, or plaque, in the artery walls). Lifestyle interventions such as dietary choices and physical activity address the cause of atherosclerosis. A review of the types of fats and their impact on blood cholesterol levels, along with dietary recommendations to reduce fat intake is also included.  Nutrition Action Plan  Clinical staff conducted group or individual video education with verbal and written material and guidebook.  Patient learns how to incorporate Pritikin recommendations into their lifestyle. Recommendations include planning and keeping personal health goals in mind as an important part of their success.  Healthy Mind-Set    Healthy Minds, Bodies, Hearts  Clinical staff conducted group or individual video education with verbal and written material and guidebook.  Patient learns how to identify when they are stressed. Video will discuss the impact of that stress, as well as the many benefits of stress management. Patient will also be introduced to stress management techniques. The way we think, act, and feel has an impact on our hearts.  How Our Thoughts Can Heal Our Hearts  Clinical staff conducted group or individual video education with verbal and written material and guidebook.  Patient learns that negative thoughts can cause depression and anxiety. This can result in negative lifestyle behavior and serious health problems. Cognitive behavioral therapy is an effective method to help control our thoughts in order to change and  improve our emotional outlook.  Additional Videos:  Exercise  Improving Performance  Clinical staff conducted group or individual video education with verbal and written material and guidebook.  Patient learns to use a non-linear approach by alternating intensity levels and lengths of time spent exercising to help burn more calories and lose more body fat. Cardiovascular exercise helps improve heart health, metabolism, hormonal balance, blood sugar control, and recovery from fatigue. Resistance training improves strength, endurance, balance, coordination, reaction time, metabolism, and muscle mass. Flexibility exercise improves circulation, posture, and balance. Seek guidance from your physician and exercise physiologist before implementing an exercise routine and learn your capabilities and proper form for all exercise.  Introduction to Yoga  Clinical staff conducted group or individual video education with verbal and written material and guidebook.  Patient learns about yoga, a discipline of the coming together of mind, breath, and body. The benefits of yoga include improved flexibility, improved range of motion, better posture and core strength, increased lung function, weight loss, and positive self-image. Yoga's heart health benefits include lowered blood pressure, healthier heart rate, decreased cholesterol and triglyceride levels, improved immune function, and reduced stress. Seek guidance from your physician and exercise physiologist before implementing an exercise routine and learn your capabilities and proper form for all exercise.  Medical   Aging: Enhancing Your Quality of Life  Clinical staff conducted group or individual video education with verbal and written material and guidebook.  Patient learns key strategies and recommendations to stay in good physical health and enhance quality of life, such as prevention strategies, having an advocate, securing a Health Care Proxy and Power of  Attorney, and keeping a list of medications and system for tracking them. It also discusses how to avoid risk for bone loss.  Biology of Weight Control  Clinical staff conducted group or individual video education with verbal and written material and guidebook.  Patient learns that weight gain occurs because we consume more calories than we burn (eating more, moving less). Even if your body weight is normal, you may have higher ratios of fat compared to muscle mass. Too much body fat puts you at increased risk for cardiovascular disease, heart attack, stroke, type 2 diabetes, and obesity-related cancers. In addition to exercise, following the Pritikin Eating Plan can help reduce your risk.  Decoding Lab Results  Clinical staff conducted group or individual video education with verbal and written material and guidebook.  Patient learns that lab test reflects one measurement whose values change over time and are influenced by many factors, including medication, stress, sleep, exercise, food, hydration, pre-existing medical conditions, and more. It is recommended to use the knowledge from this video to become more involved with your lab results and evaluate your numbers to speak with your doctor.   Diseases of Our Time - Overview  Clinical staff conducted group or individual video education with verbal and written material and guidebook.  Patient learns that according to the CDC, 50% to 70% of chronic diseases (such as obesity, type 2 diabetes, elevated lipids, hypertension, and heart disease) are avoidable through lifestyle improvements including healthier food choices, listening to satiety cues, and increased physical activity.  Sleep Disorders Clinical staff conducted group or individual video education with verbal and written material and guidebook.  Patient learns how good quality and duration of sleep are important to overall health and well-being. Patient also learns about sleep disorders and  how they impact health along with recommendations to address them, including discussing with a physician.  Nutrition  Dining Out - Part 2 Clinical staff conducted  group or individual video education with verbal and written material and guidebook.  Patient learns how to plan ahead and communicate in order to maximize their dining experience in a healthy and nutritious manner. Included are recommended food choices based on the type of restaurant the patient is visiting.   Fueling a Banker conducted group or individual video education with verbal and written material and guidebook.  There is a strong connection between our food choices and our health. Diseases like obesity and type 2 diabetes are very prevalent and are in large-part due to lifestyle choices. The Pritikin Eating Plan provides plenty of food and hunger-curbing satisfaction. It is easy to follow, affordable, and helps reduce health risks.  Menu Workshop  Clinical staff conducted group or individual video education with verbal and written material and guidebook.  Patient learns that restaurant meals can sabotage health goals because they are often packed with calories, fat, sodium, and sugar. Recommendations include strategies to plan ahead and to communicate with the manager, chef, or server to help order a healthier meal.  Planning Your Eating Strategy  Clinical staff conducted group or individual video education with verbal and written material and guidebook.  Patient learns about the Pritikin Eating Plan and its benefit of reducing the risk of disease. The Pritikin Eating Plan does not focus on calories. Instead, it emphasizes high-quality, nutrient-rich foods. By knowing the characteristics of the foods, we choose, we can determine their calorie density and make informed decisions.  Targeting Your Nutrition Priorities  Clinical staff conducted group or individual video education with verbal and written  material and guidebook.  Patient learns that lifestyle habits have a tremendous impact on disease risk and progression. This video provides eating and physical activity recommendations based on your personal health goals, such as reducing LDL cholesterol, losing weight, preventing or controlling type 2 diabetes, and reducing high blood pressure.  Vitamins and Minerals  Clinical staff conducted group or individual video education with verbal and written material and guidebook.  Patient learns different ways to obtain key vitamins and minerals, including through a recommended healthy diet. It is important to discuss all supplements you take with your doctor.   Healthy Mind-Set    Smoking Cessation  Clinical staff conducted group or individual video education with verbal and written material and guidebook.  Patient learns that cigarette smoking and tobacco addiction pose a serious health risk which affects millions of people. Stopping smoking will significantly reduce the risk of heart disease, lung disease, and many forms of cancer. Recommended strategies for quitting are covered, including working with your doctor to develop a successful plan.  Culinary   Becoming a Set designer conducted group or individual video education with verbal and written material and guidebook.  Patient learns that cooking at home can be healthy, cost-effective, quick, and puts them in control. Keys to cooking healthy recipes will include looking at your recipe, assessing your equipment needs, planning ahead, making it simple, choosing cost-effective seasonal ingredients, and limiting the use of added fats, salts, and sugars.  Cooking - Breakfast and Snacks  Clinical staff conducted group or individual video education with verbal and written material and guidebook.  Patient learns how important breakfast is to satiety and nutrition through the entire day. Recommendations include key foods to eat during  breakfast to help stabilize blood sugar levels and to prevent overeating at meals later in the day. Planning ahead is also a key component.  Cooking - Futures trader  and Sides  Clinical staff conducted group or individual video education with verbal and written material and guidebook.  Patient learns eating strategies to improve overall health, including an approach to cook more at home. Recommendations include thinking of animal protein as a side on your plate rather than center stage and focusing instead on lower calorie dense options like vegetables, fruits, whole grains, and plant-based proteins, such as beans. Making sauces in large quantities to freeze for later and leaving the skin on your vegetables are also recommended to maximize your experience.  Cooking - Healthy Salads and Dressing Clinical staff conducted group or individual video education with verbal and written material and guidebook.  Patient learns that vegetables, fruits, whole grains, and legumes are the foundations of the Pritikin Eating Plan. Recommendations include how to incorporate each of these in flavorful and healthy salads, and how to create homemade salad dressings. Proper handling of ingredients is also covered. Cooking - Soups and [employer] - Soups and Desserts Clinical staff conducted group or individual video education with verbal and written material and guidebook.  Patient learns that Pritikin soups and desserts make for easy, nutritious, and delicious snacks and meal components that are low in sodium, fat, sugar, and calorie density, while high in vitamins, minerals, and filling fiber. Recommendations include simple and healthy ideas for soups and desserts.   Overview     The Pritikin Solution Program Overview Clinical staff conducted group or individual video education with verbal and written material and guidebook.  Patient learns that the results of the Pritikin Program have been documented in more than 100  articles published in peer-reviewed journals, and the benefits include reducing risk factors for (and, in some cases, even reversing) high cholesterol, high blood pressure, type 2 diabetes, obesity, and more! An overview of the three key pillars of the Pritikin Program will be covered: eating well, doing regular exercise, and having a healthy mind-set.  WORKSHOPS  Exercise: Exercise Basics: Building Your Action Plan Clinical staff led group instruction and group discussion with PowerPoint presentation and patient guidebook. To enhance the learning environment the use of posters, models and videos may be added. At the conclusion of this workshop, patients will comprehend the difference between physical activity and exercise, as well as the benefits of incorporating both, into their routine. Patients will understand the FITT (Frequency, Intensity, Time, and Type) principle and how to use it to build an exercise action plan. In addition, safety concerns and other considerations for exercise and cardiac rehab will be addressed by the presenter. The purpose of this lesson is to promote a comprehensive and effective weekly exercise routine in order to improve patients' overall level of fitness.   Managing Heart Disease: Your Path to a Healthier Heart Clinical staff led group instruction and group discussion with PowerPoint presentation and patient guidebook. To enhance the learning environment the use of posters, models and videos may be added.At the conclusion of this workshop, patients will understand the anatomy and physiology of the heart. Additionally, they will understand how Pritikin's three pillars impact the risk factors, the progression, and the management of heart disease.  The purpose of this lesson is to provide a high-level overview of the heart, heart disease, and how the Pritikin lifestyle positively impacts risk factors.  Exercise Biomechanics Clinical staff led group instruction and  group discussion with PowerPoint presentation and patient guidebook. To enhance the learning environment the use of posters, models and videos may be added. Patients will learn how the  structural parts of their bodies function and how these functions impact their daily activities, movement, and exercise. Patients will learn how to promote a neutral spine, learn how to manage pain, and identify ways to improve their physical movement in order to promote healthy living. The purpose of this lesson is to expose patients to common physical limitations that impact physical activity. Participants will learn practical ways to adapt and manage aches and pains, and to minimize their effect on regular exercise. Patients will learn how to maintain good posture while sitting, walking, and lifting.  Balance Training and Fall Prevention  Clinical staff led group instruction and group discussion with PowerPoint presentation and patient guidebook. To enhance the learning environment the use of posters, models and videos may be added. At the conclusion of this workshop, patients will understand the importance of their sensorimotor skills (vision, proprioception, and the vestibular system) in maintaining their ability to balance as they age. Patients will apply a variety of balancing exercises that are appropriate for their current level of function. Patients will understand the common causes for poor balance, possible solutions to these problems, and ways to modify their physical environment in order to minimize their fall risk. The purpose of this lesson is to teach patients about the importance of maintaining balance as they age and ways to minimize their risk of falling.  WORKSHOPS   Nutrition:  Fueling a Ship broker led group instruction and group discussion with PowerPoint presentation and patient guidebook. To enhance the learning environment the use of posters, models and videos may be  added. Patients will review the foundational principles of the Pritikin Eating Plan and understand what constitutes a serving size in each of the food groups. Patients will also learn Pritikin-friendly foods that are better choices when away from home and review make-ahead meal and snack options. Calorie density will be reviewed and applied to three nutrition priorities: weight maintenance, weight loss, and weight gain. The purpose of this lesson is to reinforce (in a group setting) the key concepts around what patients are recommended to eat and how to apply these guidelines when away from home by planning and selecting Pritikin-friendly options. Patients will understand how calorie density may be adjusted for different weight management goals.  Mindful Eating  Clinical staff led group instruction and group discussion with PowerPoint presentation and patient guidebook. To enhance the learning environment the use of posters, models and videos may be added. Patients will briefly review the concepts of the Pritikin Eating Plan and the importance of low-calorie dense foods. The concept of mindful eating will be introduced as well as the importance of paying attention to internal hunger signals. Triggers for non-hunger eating and techniques for dealing with triggers will be explored. The purpose of this lesson is to provide patients with the opportunity to review the basic principles of the Pritikin Eating Plan, discuss the value of eating mindfully and how to measure internal cues of hunger and fullness using the Hunger Scale. Patients will also discuss reasons for non-hunger eating and learn strategies to use for controlling emotional eating.  Targeting Your Nutrition Priorities Clinical staff led group instruction and group discussion with PowerPoint presentation and patient guidebook. To enhance the learning environment the use of posters, models and videos may be added. Patients will learn how to determine  their genetic susceptibility to disease by reviewing their family history. Patients will gain insight into the importance of diet as part of an overall healthy lifestyle in mitigating the  impact of genetics and other environmental insults. The purpose of this lesson is to provide patients with the opportunity to assess their personal nutrition priorities by looking at their family history, their own health history and current risk factors. Patients will also be able to discuss ways of prioritizing and modifying the Pritikin Eating Plan for their highest risk areas  Menu  Clinical staff led group instruction and group discussion with PowerPoint presentation and patient guidebook. To enhance the learning environment the use of posters, models and videos may be added. Using menus brought in from [employer], or printed from [employer], patients will apply the Pritikin dining out guidelines that were presented in the [employer] video. Patients will also be able to practice these guidelines in a variety of provided scenarios. The purpose of this lesson is to provide patients with the opportunity to practice hands-on learning of the Pritikin Dining Out guidelines with actual menus and practice scenarios.  Label Reading Clinical staff led group instruction and group discussion with PowerPoint presentation and patient guidebook. To enhance the learning environment the use of posters, models and videos may be added. Patients will review and discuss the Pritikin label reading guidelines presented in Pritikin's Label Reading Educational series video. Using fool labels brought in from local grocery stores and markets, patients will apply the label reading guidelines and determine if the packaged food meet the Pritikin guidelines. The purpose of this lesson is to provide patients with the opportunity to review, discuss, and practice hands-on learning of the Pritikin Label Reading guidelines  with actual packaged food labels. Cooking School  Pritikin's [employer] are designed to teach patients ways to prepare quick, simple, and affordable recipes at home. The importance of nutrition's role in chronic disease risk reduction is reflected in its emphasis in the overall Pritikin program. By learning how to prepare essential core Pritikin Eating Plan recipes, patients will increase control over what they eat; be able to customize the flavor of foods without the use of added salt, sugar, or fat; and improve the quality of the food they consume. By learning a set of core recipes which are easily assembled, quickly prepared, and affordable, patients are more likely to prepare more healthy foods at home. These workshops focus on convenient breakfasts, simple entres, side dishes, and desserts which can be prepared with minimal effort and are consistent with nutrition recommendations for cardiovascular risk reduction. Cooking [employer] are taught by a Armed forces logistics/support/administrative officer (RD) who has been trained by the [employer]. The chef or RD has a clear understanding of the importance of minimizing - if not completely eliminating - added fat, sugar, and sodium in recipes. Throughout the series of Cooking School Workshop sessions, patients will learn about healthy ingredients and efficient methods of cooking to build confidence in their capability to prepare    Cooking School weekly topics:  Adding Flavor- Sodium-Free  Fast and Healthy Breakfasts  Powerhouse Plant-Based Proteins  Satisfying Salads and Dressings  Simple Sides and Sauces  International Cuisine-Spotlight on the [employer] Zones  Delicious Desserts  Savory Soups  [employer] - Meals in a Astronomer Appetizers and Snacks  Comforting Weekend Breakfasts  One-Pot Wonders   Fast Evening Meals  Landscape architect Your Pritikin Plate  WORKSHOPS   Healthy Mindset  (Psychosocial):  Focused Goals, Sustainable Changes Clinical staff led group instruction and group discussion with PowerPoint presentation and patient guidebook. To enhance the learning environment the  use of posters, models and videos may be added. Patients will be able to apply effective goal setting strategies to establish at least one personal goal, and then take consistent, meaningful action toward that goal. They will learn to identify common barriers to achieving personal goals and develop strategies to overcome them. Patients will also gain an understanding of how our mind-set can impact our ability to achieve goals and the importance of cultivating a positive and growth-oriented mind-set. The purpose of this lesson is to provide patients with a deeper understanding of how to set and achieve personal goals, as well as the tools and strategies needed to overcome common obstacles which may arise along the way.  From Head to Heart: The Power of a Healthy Outlook  Clinical staff led group instruction and group discussion with PowerPoint presentation and patient guidebook. To enhance the learning environment the use of posters, models and videos may be added. Patients will be able to recognize and describe the impact of emotions and mood on physical health. They will discover the importance of self-care and explore self-care practices which may work for them. Patients will also learn how to utilize the 4 C's to cultivate a healthier outlook and better manage stress and challenges. The purpose of this lesson is to demonstrate to patients how a healthy outlook is an essential part of maintaining good health, especially as they continue their cardiac rehab journey.  Healthy Sleep for a Healthy Heart Clinical staff led group instruction and group discussion with PowerPoint presentation and patient guidebook. To enhance the learning environment the use of posters, models and videos may be added. At the  conclusion of this workshop, patients will be able to demonstrate knowledge of the importance of sleep to overall health, well-being, and quality of life. They will understand the symptoms of, and treatments for, common sleep disorders. Patients will also be able to identify daytime and nighttime behaviors which impact sleep, and they will be able to apply these tools to help manage sleep-related challenges. The purpose of this lesson is to provide patients with a general overview of sleep and outline the importance of quality sleep. Patients will learn about a few of the most common sleep disorders. Patients will also be introduced to the concept of "sleep hygiene," and discover ways to self-manage certain sleeping problems through simple daily behavior changes. Finally, the workshop will motivate patients by clarifying the links between quality sleep and their goals of heart-healthy living.   Recognizing and Reducing Stress Clinical staff led group instruction and group discussion with PowerPoint presentation and patient guidebook. To enhance the learning environment the use of posters, models and videos may be added. At the conclusion of this workshop, patients will be able to understand the types of stress reactions, differentiate between acute and chronic stress, and recognize the impact that chronic stress has on their health. They will also be able to apply different coping mechanisms, such as reframing negative self-talk. Patients will have the opportunity to practice a variety of stress management techniques, such as deep abdominal breathing, progressive muscle relaxation, and/or guided imagery.  The purpose of this lesson is to educate patients on the role of stress in their lives and to provide healthy techniques for coping with it.  Learning Barriers/Preferences:  Learning Barriers/Preferences - 04/09/24 1429       Learning Barriers/Preferences   Learning Barriers Exercise Concerns   Unsteady  balance, 4 seconds on single leg stand   Learning Preferences Verbal Instruction;Video;Skilled Demonstration;Group Instruction;Individual  Instruction;Audio             Education Topics:  Knowledge Questionnaire Score:  Knowledge Questionnaire Score - 04/09/24 1430       Knowledge Questionnaire Score   Pre Score 24/24             Core Components/Risk Factors/Patient Goals at Admission:  Personal Goals and Risk Factors at Admission - 04/09/24 1430       Core Components/Risk Factors/Patient Goals on Admission    Weight Management Yes;Obesity;Weight Loss    Intervention Weight Management: Develop a combined nutrition and exercise program designed to reach desired caloric intake, while maintaining appropriate intake of nutrient and fiber, sodium and fats, and appropriate energy expenditure required for the weight goal.;Weight Management: Provide education and appropriate resources to help participant work on and attain dietary goals.;Weight Management/Obesity: Establish reasonable short term and long term weight goals.;Obesity: Provide education and appropriate resources to help participant work on and attain dietary goals.    Admit Weight 214 lb 15.2 oz (97.5 kg)    Expected Outcomes Short Term: Continue to assess and modify interventions until short term weight is achieved;Long Term: Adherence to nutrition and physical activity/exercise program aimed toward attainment of established weight goal;Weight Loss: Understanding of general recommendations for a balanced deficit meal plan, which promotes 1-2 lb weight loss per week and includes a negative energy balance of [id number] kcal/d;Understanding recommendations for meals to include 15-35% energy as protein, 25-35% energy from fat, 35-60% energy from carbohydrates, less than 200mg  of dietary cholesterol, 20-35 gm of total fiber daily;Understanding of distribution of calorie intake throughout the day with the consumption of 4-5 meals/snacks     Diabetes Yes    Intervention Provide education about signs/symptoms and action to take for hypo/hyperglycemia.;Provide education about proper nutrition, including hydration, and aerobic/resistive exercise prescription along with prescribed medications to achieve blood glucose in normal ranges: Fasting glucose 65-99 mg/dL    Expected Outcomes Short Term: Participant verbalizes understanding of the signs/symptoms and immediate care of hyper/hypoglycemia, proper foot care and importance of medication, aerobic/resistive exercise and nutrition plan for blood glucose control.;Long Term: Attainment of HbA1C < 7%.    Hypertension Yes    Intervention Provide education on lifestyle modifcations including regular physical activity/exercise, weight management, moderate sodium restriction and increased consumption of fresh fruit, vegetables, and low fat dairy, alcohol moderation, and smoking cessation.;Monitor prescription use compliance.    Expected Outcomes Short Term: Continued assessment and intervention until BP is < 140/39mm HG in hypertensive participants. < 130/46mm HG in hypertensive participants with diabetes, heart failure or chronic kidney disease.;Long Term: Maintenance of blood pressure at goal levels.    Lipids Yes    Intervention Provide education and support for participant on nutrition & aerobic/resistive exercise along with prescribed medications to achieve LDL 70mg , HDL >40mg .    Expected Outcomes Short Term: Participant states understanding of desired cholesterol values and is compliant with medications prescribed. Participant is following exercise prescription and nutrition guidelines.;Long Term: Cholesterol controlled with medications as prescribed, with individualized exercise RX and with personalized nutrition plan. Value goals: LDL < 70mg , HDL > 40 mg.    Stress Yes    Intervention Offer individual and/or small group education and counseling on adjustment to heart disease, stress  management and health-related lifestyle change. Teach and support self-help strategies.;Refer participants experiencing significant psychosocial distress to appropriate mental health specialists for further evaluation and treatment. When possible, include family members and significant others in education/counseling sessions.    Expected Outcomes Short  Term: Participant demonstrates changes in health-related behavior, relaxation and other stress management skills, ability to obtain effective social support, and compliance with psychotropic medications if prescribed.;Long Term: Emotional wellbeing is indicated by absence of clinically significant psychosocial distress or social isolation.    Personal Goal Other Yes    Personal Goal Short:knowledge on diet, balance. Long: Under 200lbs, tone muscle    Intervention Willc ontinue to monitor pt and progress workloads as tolerated without sign or symptom    Expected Outcomes Pt will achieve his goals and gain strength             Core Components/Risk Factors/Patient Goals Review:    Core Components/Risk Factors/Patient Goals at Discharge (Final Review):    ITP Comments:  ITP Comments     Row Name 04/09/24 1214 04/15/24 0954         ITP Comments Introduction to [facility] Education Program/Intensive Cardiac Rehab. Initial orientation packet reviewed with the patient 30 Day ITP Review. Patient reported for first day of exercise profound bradycardia noted. Onsite provider evaluated the patient. 12 lead ECG obtained. EMS called patient was transported to the ED for further evaluation               Comments: See ITP comments. Exercise is on hold.Monte Antonio RN BSN

## 2024-04-15 NOTE — H&P (Addendum)
 ELECTROPHYSIOLOGY CONSULT NOTE    Patient ID: Darin Mcclure MRN: [id number], DOB/AGE: 29-Sep-1941 83 y.o.  Admit date: 04/15/2024 Date of Consult: 04/15/2024  Primary Physician: [name], MD Primary Cardiologist: Dorothye Gathers, MD  Electrophysiologist: New   Referring Provider: Dr. Aldean Amass  Patient Profile: Darin Mcclure is a 83 y.o. 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 Goldtson.  HPI:  Pt called cardiology office yesterday with reports of HRs in the 30s. Was told to stop metoprolol  and to attend cardiac rehab scheduled for today.      There, his HR was again noted to be in 30s prompting ED evaluation.   Labs on admission include Cr 1.99 (Baseline 1.6-1.8),  HS trop 32 -> 27, Hgb 12.0, BNP pending.  CXR with small left pleural effusion and left lung base atelectasis.    Pt is currently asymptomatic at rest. Has lightheadedness and SOB with exertion. Otherwise, denies chest pain, palpitations, , PND, orthopnea, nausea, vomiting, syncope, edema, weight gain, or early satiety. He has a seroma on his LLE s/p CABG vein harvest which is slowly improving. He is very worried about his AKI, and getting input from his cardiology and TCTS teams.    Labs Potassium4.5 (05/21 1031)   Creatinine, ser  2.10* (05/21 1031) PLT  169 (05/21 1021) HGB  12.6* (05/21 1031) WBC 8.1 (05/21 1021) Troponin I (High Sensitivity)32* (05/21 1021).    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) 08/26/2017   GERD (gastroesophageal reflux disease)    Heart disease    Hyperglycemia    Hypertension    Mixed hyperlipidemia    Muscle cramp  08/26/2017   Neuropathy    Osteoarthritis    RBBB    RBBB (right bundle branch block)    Type 2 diabetes mellitus (HCC)      Surgical History:  Past Surgical History:  Procedure Laterality Date   ACHILLES TENDON REPAIR Right    BACK SURGERY     CARDIOVERSION N/A 03/03/2024   Procedure: CARDIOVERSION;  Surgeon: Jerryl Morin, DO;  [facility]: [facility];  Service: Cardiovascular;  Laterality: N/A;   CARDIOVERSION N/A 03/13/2024   Procedure: CARDIOVERSION;  Surgeon: Euell Herrlich, MD;  Location: [facility];  Service: Cardiovascular;  Laterality: N/A;   CLIPPING OF ATRIAL APPENDAGE N/A 01/21/2024   Procedure: CLIPPING OF ATRIAL APPENDAGE USING ATRICURE ATRICLIP PRO2;  Surgeon: Hilarie Lovely, MD;  Location: [facility] OR;  Service: Open Heart Surgery;  Laterality: N/A;   CORONARY ANGIOPLASTY     pror stent to mid RCA 10/04/2006   CORONARY ARTERY BYPASS GRAFT N/A 01/21/2024   Procedure: CORONARY ARTERY BYPASS GRAFTING (CABG) TIMES FOUR USING LEFT INTERNAL MAMMARY ARTERY AND BILATERAL ENDOSCOPICALLY HARVESTED GREATER SAPHENOUS VEINS;  Surgeon: Hilarie Lovely, MD;  Location: [facility] OR;  Service: Open Heart Surgery;  Laterality: N/A;   FOOT SURGERY     Cyst removal   INGUINAL HERNIA REPAIR     IR THORACENTESIS ASP PLEURAL SPACE W/IMG GUIDE  02/25/2024   IR THORACENTESIS ASP PLEURAL SPACE W/IMG GUIDE  03/06/2024   LEFT HEART CATH AND  CORONARY ANGIOGRAPHY N/A 01/15/2024   Procedure: LEFT HEART CATH AND CORONARY ANGIOGRAPHY;  Surgeon: Odie Benne, MD;  Location: [facility];  Service: Cardiovascular;  Laterality: N/A;   MAZE N/A 01/21/2024   Procedure: MAZE;  Surgeon: Hilarie Lovely, MD;  Location: [facility];  Service: Open Heart Surgery;  Laterality: N/A;   PERCUTANEOUS CORONARY STENT INTERVENTION (PCI-S)  09/2006   REPLACEMENT TOTAL KNEE Left    TEE WITHOUT CARDIOVERSION N/A 01/21/2024   Procedure: TRANSESOPHAGEAL ECHOCARDIOGRAM (TEE);  Surgeon: Hilarie Lovely, MD;  Location: [facility] OR;  Service: Open Heart Surgery;  Laterality: N/A;   TOTAL HIP ARTHROPLASTY Right    TOTAL HIP ARTHROPLASTY Left      (Not in a hospital admission)   Inpatient Medications:   Allergies: No Known Allergies  Family History  Problem Relation Age of Onset   CAD Father    Lung cancer Father    Colon cancer Neg Hx    Esophageal cancer Neg Hx    Rectal cancer Neg Hx    Stomach cancer Neg Hx      Physical Exam: Vitals:   04/15/24 1030 04/15/24 1045 04/15/24 1100 04/15/24 1245  BP: 133/65 133/66 127/61 130/69  Pulse: (!) 30 (!) 32 (!) 30 (!) 32  Resp: 14 15 12 18   Temp:      TempSrc:      SpO2: 100% 100% 100% 100%    GEN- NAD, A&O x 3, normal affect HEENT: Normocephalic, atraumatic Lungs- CTAB, Normal effort.  Heart- Slow, regular rate and rhythm, No M/G/R.  GI- Soft, NT, ND.  Extremities- No clubbing, cyanosis, or edema   Radiology/Studies: DG Chest Portable 1 View Result Date: 04/15/2024 CLINICAL DATA:  Shortness of breath. EXAM: PORTABLE CHEST 1 VIEW COMPARISON:  Chest radiograph dated 04/07/2024. FINDINGS: Small left pleural effusion and left lung base atelectasis. The right lung is clear. No pneumothorax. Stable cardiomegaly. Median sternotomy wires and CABG vascular clips. No acute osseous pathology. IMPRESSION: Small left pleural effusion and left lung base atelectasis. Electronically Signed   By: Angus Bark M.D.   On: 04/15/2024 11:37   DG Chest 2 View Result Date: 04/07/2024 CLINICAL DATA:  Shortness of breath.  Weight gain EXAM: CHEST - 2 VIEW COMPARISON:  X-ray 03/27/2024 FINDINGS: Sternal wires. Atrial occlusion clip. Stable small left pleural effusion. No pneumothorax, edema or consolidation. Degenerative changes of the spine. Films are under penetrated. Normal cardiopericardial silhouette. Calcified aorta. IMPRESSION: Postop chest.  Small left effusion. Electronically Signed   By: Adrianna Horde M.D.   On: 04/07/2024 10:15   DG  Chest 2 View Result Date: 03/27/2024 EXAM: 2 VIEW(S) XRAY OF THE CHEST 03/27/2024 08:48:00 AM COMPARISON: None available. CLINICAL HISTORY: S/P CABG FINDINGS: LUNGS AND PLEURA: No consolidation. No pulmonary edema. Small left pleural effusion, stable. No pneumothorax. HEART AND MEDIASTINUM: No acute abnormality of the cardiac and mediastinal silhouettes. Left atrial appendage. BONES AND SOFT TISSUES: No acute osseous abnormality. Intact sternotomy wires. IMPRESSION: 1. Small left pleural effusion, stable. 2. No pulmonary edema. No pneumothorax. Electronically signed by: Karlyn Overman MD 03/27/2024 08:54 AM EDT RP Workstation: [id number]   DG Chest 2 View Result Date: 03/18/2024 CLINICAL DATA:  Shortness of breath. EXAM: CHEST - 2 VIEW COMPARISON:  March 12, 2024. FINDINGS: Stable cardiomediastinal silhouette. Status post coronary artery bypass graft. Right lung is clear. Small left pleural effusion is noted with associated left basilar atelectasis. Bony thorax is unremarkable. IMPRESSION: Small left pleural effusion is again noted with  associated left basilar atelectasis. Electronically Signed   By: Rosalene Colon M.D.   On: 03/18/2024 15:25    EKG:on arrival shows likely junctional rhythm at 33 bpm. More likely sinus node arrest vs very fine AF (personally reviewed)  TELEMETRY: Appears to demonstrate SND/Sinus arrest with junctional escape in the 30s -> sinus brady at times.  (personally reviewed)  Assessment/Plan:  SND Sinus arrest Junctional bradycardia With escape R bundle escape in the 30s.  Last dose of toprol  5/20. No clear reversible causes with conduction disease at baseline and no active issues.   Pt is currently hopeful his rhythm Natanel Snavely improve and that he wont need pacing.   Ndea Kilroy continue PPM discussions pending course  Paroxysmal AF Continue amiodarone  200 mg daily for AF Hold eliquis  for procedures  DM2 Sliding scale.  Hold Januvia in case any type of sedation is  needed.  CAD s/p CABG Denies s/s ischemia RLE seroma improving. Encouraged compression as has been previously directed.    For questions or updates, please contact [facility] Please consult [email or web address] for contact info under Cardiology/STEMI.  Signed, Tylene Galla, PA-C  04/15/2024 1:44 PM   I have seen and examined this patient with Michaelle Adolphus.  Agree with above, note added to reflect my findings.  Patient with a past history of pleural effusion, coronary artery disease post CABG, atrial fibrillation post left atrial appendage closure and maze presented to the hospital with bradycardia.  He called the cardiologist office with heart rates of 30s.  He was told to stop his metoprolol .  He went to cardiac rehab and again noted that heart rates were in the 30s.  He is asymptomatic at rest but does have fatigue, lightheadedness, shortness of breath with exertion.  GEN: No acute distress.   Neck: No JVD Cardiac: RRR, no murmurs, rubs, or gallops.  Respiratory: normal BS bases bilaterally. GI: Soft, nontender, non-distended  MS: No edema; No deformity. Neuro:  Nonfocal  Skin: warm and dry Psych: Normal affect    1.  Sinus arrest: Patient feels well at rest.  When I was in the room interviewing the patient, his sinus node began to conduct.  His heart rates are in the 40s in sinus rhythm.  His metoprolol  has been stopped.  Charlyn Vialpando watch him overnight tonight.  If he has improvement in his heart rates, Hervey Wedig likely plan for discharge and follow-up with cardiac monitor.  If heart rates do not improve, Rejoice Heatwole likely need pacemaker implant.  2.  Paroxysmal atrial fibrillation: Continue amiodarone   3.  Coronary artery disease: Post CABG.  No current ischemic symptoms.  4.  Type 2 diabetes: Sliding scale insulin   Eliette Drumwright M. Keymon Mcelroy MD 04/16/2024 8:58 AM

## 2024-04-15 NOTE — ED Notes (Signed)
Patient given cranberry juice

## 2024-04-15 NOTE — Progress Notes (Signed)
 Incomplete Session Note  Patient Details  Name: Darin Mcclure MRN: [id number] Date of Birth: 1941-02-28 Referring Provider:   Flowsheet Row INTENSIVE CARDIAC REHAB ORIENT from 04/09/2024 in [facility]  Referring Provider Dr. Dorothye Gathers, MD       Darin Mcclure did not complete his rehab session. Patient here for cardiac rehab first day of exercise which the patient did not participate in due to profound bradycardia.  Patient says his heart rate was noted in the 30's and 40's yesterday. Patient called Dr Annabell Baron office and was told to hold his betablocker. Blood pressure 122/64 heart rate currently 33. Patient taken to the treatment room and placed on the Zoll.  Placed on Zoll telemetry rhythm Sinus brady versus junctional  rate 34. Patient awake and oriented.  per onsite provider [facility] NP. 12 lead ordered and obtained. Patient admits to feeling short of breath. Skin warm and dry. CBG 167.  Onsite provider Darin Bobo NP assessed the patient and reviewed the 12 lead ECG. Oxygen saturation 100% on room air EMS called as patient will need evaluation in the ED. Patient's wife is present and aware of today's events. EMS on site and given report by Staff and onsite provider Darin Bobo NP. Patient transported to the ED via EMS for further evaluation.Monte Antonio RN BSN

## 2024-04-15 NOTE — ED Notes (Signed)
 Pt resting, sitting on the bedside. Denies dizziness or lightheadedness. Awaiting bed to be cleaned upstairs.

## 2024-04-15 NOTE — ED Notes (Signed)
 Cardiology at bedside.

## 2024-04-16 ENCOUNTER — Inpatient Hospital Stay (HOSPITAL_COMMUNITY)
Admit: 2024-04-16 | Discharge: 2024-04-16 | Disposition: A | Discharge status: Home / Self Care | Attending: Student | Admitting: Student

## 2024-04-16 DIAGNOSIS — I495 Sick sinus syndrome: Secondary | ICD-10-CM

## 2024-04-16 DIAGNOSIS — I2510 Atherosclerotic heart disease of native coronary artery without angina pectoris: Secondary | ICD-10-CM | POA: Diagnosis not present

## 2024-04-16 DIAGNOSIS — Z951 Presence of aortocoronary bypass graft: Secondary | ICD-10-CM | POA: Diagnosis not present

## 2024-04-16 DIAGNOSIS — I480 Paroxysmal atrial fibrillation: Secondary | ICD-10-CM | POA: Diagnosis not present

## 2024-04-16 DIAGNOSIS — R001 Bradycardia, unspecified: Secondary | ICD-10-CM | POA: Diagnosis not present

## 2024-04-16 LAB — BASIC METABOLIC PANEL WITH GFR
Anion gap: 12 (ref 5–15)
BUN: 33 mg/dL — ABNORMAL HIGH (ref 8–23)
CO2: 23 mmol/L (ref 22–32)
Calcium: 9.4 mg/dL (ref 8.9–10.3)
Chloride: 103 mmol/L (ref 98–111)
Creatinine, Ser: 1.59 mg/dL — ABNORMAL HIGH (ref 0.61–1.24)
GFR, Estimated: 43 mL/min — ABNORMAL LOW (ref >60)
Glucose, Bld: 94 mg/dL (ref 70–99)
Potassium: 4.6 mmol/L (ref 3.5–5.1)
Sodium: 138 mmol/L (ref 135–145)

## 2024-04-16 LAB — CBC
HCT: 36.4 % — ABNORMAL LOW (ref 39.0–52.0)
Hemoglobin: 11.9 g/dL — ABNORMAL LOW (ref 13.0–17.0)
MCH: 29.2 pg (ref 26.0–34.0)
MCHC: 32.7 g/dL (ref 30.0–36.0)
MCV: 89.2 fL (ref 80.0–100.0)
Platelets: 138 10*3/uL — ABNORMAL LOW (ref 150–400)
RBC: 4.08 MIL/uL — ABNORMAL LOW (ref 4.22–5.81)
RDW: 15 % (ref 11.5–15.5)
WBC: 6.6 10*3/uL (ref 4.0–10.5)
nRBC: 0 % (ref 0.0–0.2)

## 2024-04-16 LAB — BRAIN NATRIURETIC PEPTIDE: B Natriuretic Peptide: 580.4 pg/mL — ABNORMAL HIGH (ref 0.0–100.0)

## 2024-04-16 LAB — GLUCOSE, CAPILLARY: Glucose-Capillary: 95 mg/dL (ref 70–99)

## 2024-04-16 NOTE — Progress Notes (Signed)
 Discharge instructions (including medications) discussed with and copy provided to patient/caregiver

## 2024-04-16 NOTE — Progress Notes (Signed)
 Hospital applied ZIO AT Dr. Anne Fu to read.

## 2024-04-16 NOTE — Care Management CC44 (Signed)
 Condition Code 44 Documentation Completed  Patient Details  Name: Darin Mcclure MRN: [id number] Date of Birth: May 27, 1941   Condition Code 44 given:  Yes Patient signature on Condition Code 44 notice:  Yes Documentation of 2 MD's agreement:  Yes Code 44 added to claim:  Yes    Rox Cope, RN 04/16/2024, 10:11 AM

## 2024-04-16 NOTE — Care Management Obs Status (Signed)
 MEDICARE OBSERVATION STATUS NOTIFICATION   Patient Details  Name: Jamil Armwood MRN: [id number] Date of Birth: 15-Jul-1941   Medicare Observation Status Notification Given:  Yes    Rox Cope, RN 04/16/2024, 10:11 AM

## 2024-04-16 NOTE — Discharge Summary (Addendum)
 ELECTROPHYSIOLOGY DISCHARGE SUMMARY    Patient ID: Darin Mcclure,  MRN: [id number], DOB/AGE: 82-Nov-1942 82 y.o.  Admit date: 04/15/2024 Discharge date: 04/16/2024  Primary Care Physician: Darin Handsome, MD  Primary Cardiologist: Darin Gathers, MD  Electrophysiologist: Dr. 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   Hospital Course:  The patient was admitted after presenting to cardiac rehab with HRs in 30s. Had called the office the prior day with complaints of same and toprol  stopped. They were monitored on telemetry overnight which demonstrated improvement of his conduction back to NSR/Sinus brady as the toprol  washed out. Last dose 5/20. Discussed with patient and family, Darin Mcclure plan discharge with Live Zio in place. If pt has further symptomatic bradycardia Darin Mcclure plan pacing. The patient was examined and considered to be stable for discharge. The patient Darin Mcclure be seen back by EP APP in 3-4 weeks for post hospital care.   Physical Exam: Vitals:   04/16/24 0000 04/16/24 0230 04/16/24 0255 04/16/24 0851  BP: (!) 136/58 (!) 144/69 (!) 163/70 124/72  Pulse: (!) 34 (!) 55 (!) 59 (!) 58  Resp: 17 19 15 12   Temp:  97.9 F (36.6 C) 97.8 F (36.6 C) (!) 97.4 F (36.3 C)  TempSrc:   Oral Oral  SpO2: 100% 100% 100% 97%  Weight:   95 kg   Height:   6' (1.829 m)     GEN- NAD. A&O x 3.  HEENT: Normocephalic, atraumatic Lungs- CTAB, Normal effort.  Heart- RRR, No M/G/R.  GI- Soft, NT, ND.  Extremities- No clubbing, cyanosis, or edema; Groin without hematoma   Discharge Medications:  Allergies as of 04/16/2024       Reactions   Oxycodone  Other  (See Comments)   Paradoxical excitation        Medication List     STOP taking these medications    metoprolol  succinate 50 MG 24 hr tablet Commonly known as: TOPROL -XL       TAKE these medications    acetaminophen  500 MG tablet Commonly known as: TYLENOL  Take 1-2 tablets (500-1,000 mg total) by mouth every 6 (six) hours as needed.   amiodarone  200 MG tablet Commonly known as: PACERONE  Take 1 tablet (200 mg total) by mouth daily.   apixaban  5 MG Tabs tablet Commonly known as: ELIQUIS  Take 1 tablet (5 mg total) by mouth 2 (two) times daily.   aspirin  EC 81 MG tablet Take 81 mg by mouth daily.   atorvastatin  40 MG tablet Commonly known as: LIPITOR Take 1 tablet (40 mg total) by mouth at bedtime.   bumetanide  2 MG tablet Commonly known as: BUMEX  Take 1 tablet (2 mg total) by mouth daily.   famotidine  20 MG tablet Commonly known as: PEPCID  Take 1 tablet (20 mg total) by mouth daily. What changed:  when to take this reasons to take this   FIBER CHOICE PO Take 2 tablets by mouth daily.   fluticasone  50 MCG/ACT nasal spray Commonly known as: FLONASE  Place 1 spray into both nostrils daily as needed for allergies.   Januvia 100 MG tablet Generic drug: sitaGLIPtin Take 100 mg by mouth daily.  levothyroxine  75 MCG tablet Commonly known as: SYNTHROID  Take 75 mcg by mouth daily before breakfast.   Magnesium  500 MG Caps Take 1,000 mg by mouth daily.   metFORMIN  1000 MG tablet Commonly known as: GLUCOPHAGE  Take 500 mg by mouth 2 (two) times daily.   NONFORMULARY OR COMPOUNDED ITEM Topical antifungal topical solution: terbinafine 3%, fluconazole 2%, tea tree oil 5%, Urea 10%, ibuprofen2% in DMSO suspension. Sig: Apply to the affected toenail(s) once daily.  Order sent to [facility] [address] [place], [place] [address]   OMEGA-3 FISH OIL PO Take 4,000 mg by mouth at bedtime.   ONE TOUCH ULTRA TEST test strip Generic drug: glucose  blood USE 1 TO 2 TIMES A DAY   PROBIOTIC DAILY PO Take 1 capsule by mouth daily.        Disposition: Home with usual follow up as in AVS  Duration of Discharge Encounter:  APP time: 24 minutes  Signed, Darin Galla, PA-C  04/16/2024 9:02 AM    I have seen and examined this patient with Darin Mcclure.  Agree with above, note added to reflect my findings.  Patient presented to the hospital with sinus arrest.  Previously on amiodarone  and metoprolol .  Metoprolol  has been held and his sinus rates have improved.  Darin Mcclure plan for discharge today with 2-week ZIO monitor.  GEN: No acute distress.   Neck: No JVD Cardiac: RRR, no murmurs, rubs, or gallops.  Respiratory: normal BS bases bilaterally. GI: Soft, nontender, non-distended  MS: No edema; No deformity. Neuro:  Nonfocal  Skin: warm and dry Psych: Normal affect   MD time of discharge: 25 minutes  Darin Salvador M. Liona Wengert MD 04/16/2024 9:10 AM

## 2024-04-16 NOTE — ED Notes (Signed)
 Floor made aware that pt is coming up.

## 2024-04-17 ENCOUNTER — Telehealth (HOSPITAL_COMMUNITY): Payer: Self-pay | Admitting: *Deleted

## 2024-04-17 ENCOUNTER — Encounter (HOSPITAL_COMMUNITY)

## 2024-04-17 NOTE — Telephone Encounter (Signed)
-----   Message from Dorothye Gathers sent at 04/17/2024  8:32 AM EDT ----- Regarding: RE: Return to exercise at cardiac rehab Ok to resume ----- Message ----- From: Cyndee Dragon, RN Sent: 04/17/2024   8:31 AM EDT To: Hugh Madura, MD Subject: Return to exercise at cardiac rehab            Good morning Dr Renna Cary, Mr Demaria was in the hospital due to bradycardia 04/15/24-04/16/24. Achillies has an appointment with Mertha Abrahams PA on 05/08/24 and you on 05/15/24.  Please advise when you want Mr Tisdale to start exercise.  I appreciate you assistance,  Sincerely, [facility]  Cardiac Rehab

## 2024-04-17 NOTE — Telephone Encounter (Signed)
 Called Kiko to check in with him. He sts his MD instructed him to be out of Cardiac Rehab for 2 weeks after his admission. German Koller BS, ACSM-CEP 04/17/2024 7:24 AM

## 2024-04-21 ENCOUNTER — Telehealth: Payer: Self-pay | Admitting: Cardiology

## 2024-04-21 NOTE — Telephone Encounter (Signed)
 STAT if HR is under 50 or over 120 (normal HR is 60-100 beats per minute)  What is your heart rate? Rest heart rate 82  Do you have a log of your heart rate readings (document readings)?  5/23 69 5/24 72 5/25 74 5/26 76  Do you have any other symptoms? Patient states last week he went to the hospital because his heart was in the 30's and they sent him home with a monitor.  He states they took him off of his metroprolol 50mg .  He is wondering if his needs to go back on it at a lower dosage.  He was wants to know if he can restart cardiac rehab while he has his heart monitor on. Please advise.

## 2024-04-21 NOTE — Telephone Encounter (Signed)
 Spoke with pt, he was seen for symptomatic bradycardia and his metoprolol  was stopped. Since that time his heart rate has gradually gone up to 82. He does not have any palpitations and can not tell when he is in a fib or not. He is wearing a 2 week monitor but wants to know if he would need to restart the metoprolol  at a lower dose. He is getting his heart rate from his ring and blood pressure monitor, he can not feel his pulse. He also wants to know about going back to cardiac rehab. He was taken out on his first day due to his heart rate being low. Aware will forward to dr skain's to review and advise.

## 2024-04-21 NOTE — Telephone Encounter (Signed)
 Spoke with pt who is aware OK to restart cardiac rehab. Aware to continue off beta blocker at this time.  He will continue to monitor his HR at home and make us  aware if it becomes consistently over 100 bpm.  Reviewed zio monitor instructions regarding returning and when to expect the results.  Pt was appreciative of the call back and information.

## 2024-04-22 ENCOUNTER — Encounter (HOSPITAL_COMMUNITY)

## 2024-04-23 ENCOUNTER — Telehealth (HOSPITAL_COMMUNITY): Payer: Self-pay | Admitting: *Deleted

## 2024-04-23 NOTE — Telephone Encounter (Signed)
 Spoke with Darin Mcclure he has been cleared by Dr Renna Cary to resume exercise. Yitzchok plans to return to exercise on Monday.Monte Antonio RN BSN

## 2024-04-24 ENCOUNTER — Other Ambulatory Visit (HOSPITAL_COMMUNITY): Payer: Self-pay | Admitting: Family Medicine

## 2024-04-24 ENCOUNTER — Encounter (HOSPITAL_COMMUNITY)

## 2024-04-25 LAB — BASIC METABOLIC PANEL WITH GFR
BUN/Creatinine Ratio: 12 (ref 10–24)
BUN: 21 mg/dL (ref 8–27)
CO2: 22 mmol/L (ref 20–29)
Calcium: 9.6 mg/dL (ref 8.6–10.2)
Chloride: 98 mmol/L (ref 96–106)
Creatinine, Ser: 1.74 mg/dL — ABNORMAL HIGH (ref 0.76–1.27)
Glucose: 126 mg/dL — ABNORMAL HIGH (ref 70–99)
Potassium: 4.3 mmol/L (ref 3.5–5.2)
Sodium: 139 mmol/L (ref 134–144)
eGFR: 39 mL/min/{1.73_m2} — ABNORMAL LOW (ref >59)

## 2024-04-27 ENCOUNTER — Encounter (HOSPITAL_COMMUNITY)
Admission: RE | Admit: 2024-04-27 | Discharge: 2024-04-27 | Disposition: A | Discharge status: Home / Self Care | Source: Ambulatory Visit | Attending: Cardiology | Admitting: Cardiology

## 2024-04-27 ENCOUNTER — Encounter (HOSPITAL_COMMUNITY)

## 2024-04-27 DIAGNOSIS — J90 Pleural effusion, not elsewhere classified: Secondary | ICD-10-CM | POA: Insufficient documentation

## 2024-04-27 DIAGNOSIS — Z951 Presence of aortocoronary bypass graft: Secondary | ICD-10-CM | POA: Insufficient documentation

## 2024-04-27 DIAGNOSIS — Z48812 Encounter for surgical aftercare following surgery on the circulatory system: Secondary | ICD-10-CM | POA: Diagnosis present

## 2024-04-27 LAB — GLUCOSE, CAPILLARY
Glucose-Capillary: 111 mg/dL — ABNORMAL HIGH (ref 70–99)
Glucose-Capillary: 106 mg/dL — ABNORMAL HIGH (ref 70–99)

## 2024-04-27 NOTE — Progress Notes (Signed)
 Daily Session Note  Patient Details  Name: Darin Mcclure MRN: [id number] Date of Birth: 07/09/1941 Referring Provider:   Flowsheet Row INTENSIVE CARDIAC REHAB ORIENT from 04/09/2024 in [facility]  Referring Provider Dr. Dorothye Gathers, MD       Encounter Date: 04/27/2024  Check In:  Session Check In - 04/27/24 0835       Check-In   Supervising physician immediately available to respond to emergencies [facility] MD immediately available    Physician(s) Koren Persons, NP    Location [facility]    Staff Present Pablo Boards BS, ACSM-CEP, Exercise Physiologist;Karandeep Resende, RN, Lysbeth Sauger, MS, ACSM-CEP, Exercise Physiologist;Bailey Martina Sledge, MS, Exercise Physiologist;David Makemson, MS, ACSM-CEP, CCRP, Exercise Physiologist    Virtual Visit No    Medication changes reported     Yes    Comments metoprolol  discontinued    Fall or balance concerns reported    No    Tobacco Cessation No Change    Warm-up and Cool-down Performed as group-led instruction    Resistance Training Performed Yes    VAD Patient? No    PAD/SET Patient? No      Pain Assessment   Currently in Pain? No/denies    Pain Score 0-No pain    Multiple Pain Sites No             Capillary Blood Glucose: Results for orders placed or performed during the hospital encounter of 04/27/24 (from the past 24 hours)  Glucose, capillary     Status: Abnormal   Collection Time: 04/27/24  9:10 AM  Result Value Ref Range   Glucose-Capillary 111 (H) 70 - 99 mg/dL  Glucose, capillary     Status: Abnormal   Collection Time: 04/27/24 10:00 AM  Result Value Ref Range   Glucose-Capillary 106 (H) 70 - 99 mg/dL     Exercise Prescription Changes - 04/27/24 1400       Response to Exercise   Blood Pressure (Admit) 122/68    Blood Pressure (Exercise) 150/78    Blood Pressure (Exit) 120/70    Rating of Perceived Exertion (Exercise) 10    Symptoms None     Comments Pt's first day in the CRP2 program.    Duration Continue with 30 min of aerobic exercise without signs/symptoms of physical distress.    Intensity THRR unchanged      Progression   Progression Continue to progress workloads to maintain intensity without signs/symptoms of physical distress.    Average METs 2      Resistance Training   Training Prescription Yes    Weight 2 lbs    Reps 10-15    Time 5 Minutes      Interval Training   Interval Training No      NuStep   Level 1    SPM 85    Minutes 30    METs 2             Social History   Tobacco Use  Smoking Status Never  Smokeless Tobacco Never    Goals Met:  Exercise tolerated well No report of concerns or symptoms today Strength training completed today  Goals Unmet:  Not Applicable  Comments:  started cardiac rehab today per Dr Renna Cary post hospital admission.  Pt tolerated light exercise without difficulty. VSS, telemetry-Sinus Rhythm with PAC's, asymptomatic.  Medication list reconciled. Pt denies barriers to medicaiton compliance.  PSYCHOSOCIAL ASSESSMENT:  PHQ-3. Darin Mcclure had initially experienced  shortness of breath his has improved since his beta blocker was discontinued in the hospital.  Pt exhibits positive coping skills, hopeful outlook with supportive family. No psychosocial needs identified at this time, no psychosocial interventions necessary.    Pt enjoys spending time with friends and yard work.   Pt oriented to exercise equipment and routine.    Understanding verbalized. Darin Antonio RN BSN    Dr. Gaylyn Keas is Medical Director for Cardiac Rehab at [facility].

## 2024-04-28 ENCOUNTER — Ambulatory Visit: Payer: Self-pay | Admitting: Cardiology

## 2024-04-29 ENCOUNTER — Encounter (HOSPITAL_COMMUNITY)

## 2024-04-29 ENCOUNTER — Encounter (HOSPITAL_COMMUNITY)
Admission: RE | Admit: 2024-04-29 | Discharge: 2024-04-29 | Discharge status: Home / Self Care | Source: Ambulatory Visit | Attending: Cardiology

## 2024-04-29 DIAGNOSIS — Z951 Presence of aortocoronary bypass graft: Secondary | ICD-10-CM

## 2024-04-29 DIAGNOSIS — R001 Bradycardia, unspecified: Secondary | ICD-10-CM

## 2024-04-29 DIAGNOSIS — Z48812 Encounter for surgical aftercare following surgery on the circulatory system: Secondary | ICD-10-CM | POA: Diagnosis not present

## 2024-04-29 LAB — GLUCOSE, CAPILLARY
Glucose-Capillary: 151 mg/dL — ABNORMAL HIGH (ref 70–99)
Glucose-Capillary: 89 mg/dL (ref 70–99)

## 2024-04-29 NOTE — Telephone Encounter (Signed)
 Pt is calling to get lab results.

## 2024-05-01 ENCOUNTER — Encounter (HOSPITAL_COMMUNITY)
Admission: RE | Admit: 2024-05-01 | Discharge: 2024-05-01 | Disposition: A | Discharge status: Home / Self Care | Source: Ambulatory Visit | Attending: Cardiology | Admitting: Cardiology

## 2024-05-01 ENCOUNTER — Encounter (HOSPITAL_COMMUNITY)

## 2024-05-01 DIAGNOSIS — Z48812 Encounter for surgical aftercare following surgery on the circulatory system: Secondary | ICD-10-CM | POA: Diagnosis not present

## 2024-05-01 DIAGNOSIS — Z951 Presence of aortocoronary bypass graft: Secondary | ICD-10-CM

## 2024-05-01 LAB — GLUCOSE, CAPILLARY
Glucose-Capillary: 123 mg/dL — ABNORMAL HIGH (ref 70–99)
Glucose-Capillary: 91 mg/dL (ref 70–99)

## 2024-05-04 ENCOUNTER — Encounter (HOSPITAL_COMMUNITY)

## 2024-05-04 ENCOUNTER — Encounter (HOSPITAL_COMMUNITY)
Admission: RE | Admit: 2024-05-04 | Discharge: 2024-05-04 | Disposition: A | Discharge status: Home / Self Care | Source: Ambulatory Visit | Attending: Cardiology

## 2024-05-04 ENCOUNTER — Other Ambulatory Visit: Payer: Self-pay | Admitting: Physician Assistant

## 2024-05-04 DIAGNOSIS — Z48812 Encounter for surgical aftercare following surgery on the circulatory system: Secondary | ICD-10-CM | POA: Diagnosis not present

## 2024-05-04 DIAGNOSIS — Z951 Presence of aortocoronary bypass graft: Secondary | ICD-10-CM

## 2024-05-04 DIAGNOSIS — R001 Bradycardia, unspecified: Secondary | ICD-10-CM

## 2024-05-06 ENCOUNTER — Encounter (HOSPITAL_COMMUNITY)

## 2024-05-06 ENCOUNTER — Encounter (HOSPITAL_COMMUNITY)
Admission: RE | Admit: 2024-05-06 | Discharge: 2024-05-06 | Disposition: A | Discharge status: Home / Self Care | Source: Ambulatory Visit | Attending: Cardiology | Admitting: Cardiology

## 2024-05-06 DIAGNOSIS — Z48812 Encounter for surgical aftercare following surgery on the circulatory system: Secondary | ICD-10-CM | POA: Diagnosis not present

## 2024-05-06 DIAGNOSIS — Z951 Presence of aortocoronary bypass graft: Secondary | ICD-10-CM

## 2024-05-06 DIAGNOSIS — R001 Bradycardia, unspecified: Secondary | ICD-10-CM

## 2024-05-08 ENCOUNTER — Encounter (HOSPITAL_COMMUNITY)
Admission: RE | Admit: 2024-05-08 | Discharge: 2024-05-08 | Disposition: A | Discharge status: Home / Self Care | Source: Ambulatory Visit | Attending: Cardiology

## 2024-05-08 ENCOUNTER — Ambulatory Visit: Attending: Physician Assistant | Admitting: Physician Assistant

## 2024-05-08 ENCOUNTER — Encounter (HOSPITAL_COMMUNITY)

## 2024-05-08 ENCOUNTER — Encounter: Payer: Self-pay | Admitting: Physician Assistant

## 2024-05-08 VITALS — BP 138/62 | HR 55 | Ht 72.0 in | Wt 213.4 lb

## 2024-05-08 DIAGNOSIS — I2510 Atherosclerotic heart disease of native coronary artery without angina pectoris: Secondary | ICD-10-CM

## 2024-05-08 DIAGNOSIS — Z48812 Encounter for surgical aftercare following surgery on the circulatory system: Secondary | ICD-10-CM | POA: Diagnosis not present

## 2024-05-08 DIAGNOSIS — Z951 Presence of aortocoronary bypass graft: Secondary | ICD-10-CM

## 2024-05-08 DIAGNOSIS — I495 Sick sinus syndrome: Secondary | ICD-10-CM | POA: Diagnosis not present

## 2024-05-08 DIAGNOSIS — D6869 Other thrombophilia: Secondary | ICD-10-CM

## 2024-05-08 DIAGNOSIS — I480 Paroxysmal atrial fibrillation: Secondary | ICD-10-CM

## 2024-05-08 MED ORDER — AMIODARONE HCL 100 MG PO TABS: 100.0000 mg | ORAL_TABLET | Freq: Every day | ORAL | 3 refills | Status: DC

## 2024-05-08 NOTE — Patient Instructions (Signed)
 Medication Instructions:   START TAKING : AMIODARONE  100 MG ONCE A DAY   *If you need a refill on your cardiac medications before your next appointment, please call your pharmacy*  Lab Work: NONE ORDERED  TODAY    If you have labs (blood work) drawn today and your tests are completely normal, you will receive your results only by: MyChart Message (if you have MyChart) OR A paper copy in the mail If you have any lab test that is abnormal or we need to change your treatment, we will call you to review the results.  Testing/Procedures: NONE ORDERED  TODAY   Follow-Up: At [facility], you and your health needs are our priority.  As part of our continuing mission to provide you with exceptional heart care, our providers are all part of one team.  This team includes your primary Cardiologist (physician) and Advanced Practice Providers or APPs (Physician Assistants and Nurse Practitioners) who all work together to provide you with the care you need, when you need it.  Your next appointment:    6-8 week(s) ( CONTACT  CASSIE HALL/ ANGELINE HAMMER FOR EP SCHEDULING ISSUES )   Provider:   Agatha Horsfall, MD or Mertha Abrahams, PA-C    We recommend signing up for the patient portal called MyChart.  Sign up information is provided on this After Visit Summary.  MyChart is used to connect with patients for Virtual Visits (Telemedicine).  Patients are able to view lab/test results, encounter notes, upcoming appointments, etc.  Non-urgent messages can be sent to your provider as well.   To learn more about what you can do with MyChart, go to [email or web address].   Other Instructions

## 2024-05-08 NOTE — Progress Notes (Signed)
 Cardiology Office Note:  .   Date:  05/08/2024  ID:  Darin Mcclure, DOB [birth year], MRN [id number] PCP: [name], MD  [facility] Providers Cardiologist:  Dorothye Gathers, MD { EP: Dr. Lawana Pray  History of Present Illness: Darin Mcclure   Darin Mcclure is a 83 y.o. 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 which is slowly improving   Admitted 04/15/24 (EP service), with reports of slow HRs (30's) office advise he hold his metoprolol  > though later in the day at rehab HRs found in the 30's and referred to the ER Asymptomatic at rest > SOB, lightheaded with exertion SND, junctional bradycardia Home toprol  held (last dose 5/20), amiodarone  continued He had recovery of his SN function and discharged 04/16/24 with monitoring  Today's visit is scheduled as a post hospital visit ROS:   He is accompanied by his wife today He is back at cardiac rehab and making excellent progress, in fact today did the best so far Though does mention occasionally and really hit/miss with get a little winded with exertion, recovers quickly with a pause in walking and acouple deep breaths. Was hours working in the yard the other day did well, but tored the following day No near syncope or syncope, no dizzy spells No palpitations, CP or cardiac awareness He bruises easily but no bleeding or signs of bleeding  His HR observations are resting HRs 50's and with exercise 80's  Last day (#15) monitor  Wear summary HRs 41-94 (sinus/other category) No AF Formal/final report is pending In my review of some of his days appears to be either junctional or sinus  Arrhythmia/AAD hx AFib/AFlutter found PRE-op CABG?MAZE/LAA clip Feb 2025 Unfortunately inter-op TEE does not discuss LAA (clip) post to confirm occlusion amiodarone   Studies Reviewed: Darin Mcclure    EKG done today and reviewed by myself:  Accel  junctional rhythm 60bom, PACs vs PJCs, with his baseline RBBB  01/21/24: TEE POST-OP IMPRESSIONS  Limited post CPB exam: The patient separated easily from CPB.  _ Left Ventricle: The left ventricular function is normal, unchanged from  pre-bypass images. The overall EF is 60-65%.  _ Right Ventricle: The right ventricular function appears low normal,  essentially unchanged from pre-bypass images.  _ Aortic Valve: The aortic valve appears normal, unchanged from pre-bypass  images. There is no insufficiency by color Doppler.  _ Mitral Valve: The mitral valve function appears unchanged from  pre-bypass  images. 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  _ Tricuspid Valve: The tricuspid valve function appears essentially  unchanged  from pre-bypass. 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    Risk Assessment/Calculations:    Physical Exam:   VS:  There were no vitals taken for this visit.   Wt Readings from Last 3 Encounters:  04/16/24 209 lb 7 oz (95 kg)  04/09/24 214 lb 15.2 oz (97.5 kg)  03/27/24 213 lb (96.6 kg)    GEN: Well nourished, well developed in no acute distress NECK: No JVD; No carotid bruits CARDIAC: RRR, no murmurs, rubs, gallops RESPIRATORY:  CTA b/l without rales, wheezing or rhonchi  ABDOMEN: Soft, non-tender, non-distended EXTREMITIES: No edema; No deformity   ASSESSMENT AND PLAN: .    paroxysmal AFib CHA2DS2Vasc is 5, on Eliquis , appropriately dosed HRs are good  still with some junctional rhythm/rates are good  Amiodarone  S/p MAZE, LAA clip (inter-op TEE does not discuss or confirm closure/no flow with the appendage)  Long discussion today Will reduce his amiodarone  to 100mg  daily today Will send to Dr. Lawana Pray   Will defer to Dr. Rubye Corp[facility] plans/recs given his clip  SND Off BB On amiodarone  As above   CAD No symptoms C/w Dr.  Kathy Parker  Secondary hypercoagulable state 2/2 AFib   Dispo: will have him back in 6 weeks or so, sooner if needed  Signed, Debbie Fails, PA-C

## 2024-05-11 ENCOUNTER — Telehealth: Payer: Self-pay | Admitting: Cardiology

## 2024-05-11 ENCOUNTER — Encounter (HOSPITAL_COMMUNITY)
Admission: RE | Admit: 2024-05-11 | Discharge: 2024-05-11 | Disposition: A | Discharge status: Home / Self Care | Source: Ambulatory Visit | Attending: Cardiology | Admitting: Cardiology

## 2024-05-11 ENCOUNTER — Encounter (HOSPITAL_COMMUNITY)

## 2024-05-11 DIAGNOSIS — Z79899 Other long term (current) drug therapy: Secondary | ICD-10-CM

## 2024-05-11 DIAGNOSIS — R001 Bradycardia, unspecified: Secondary | ICD-10-CM

## 2024-05-11 DIAGNOSIS — Z951 Presence of aortocoronary bypass graft: Secondary | ICD-10-CM

## 2024-05-11 DIAGNOSIS — J90 Pleural effusion, not elsewhere classified: Secondary | ICD-10-CM

## 2024-05-11 DIAGNOSIS — Z48812 Encounter for surgical aftercare following surgery on the circulatory system: Secondary | ICD-10-CM | POA: Diagnosis not present

## 2024-05-11 NOTE — Addendum Note (Signed)
 Addended by: [name] on: 05/11/2024 04:47 PM   Modules accepted: Orders

## 2024-05-11 NOTE — Telephone Encounter (Signed)
 Patient identification verified by 2 forms. Sims Duck, RN     Called patient. No answer. LDVM w/ provider comments  Relayed provider recommendations  - Patient will need BMET and CBC before upcoming appt.  - orders placed and released.

## 2024-05-11 NOTE — Telephone Encounter (Signed)
 Patient identification verified by 2 forms. Darin Duck, RN     Called and spoke to patient  Patient states:  - SOB for 3 months and had fluid taken off his lungs 3 different times. SOB is not consistent, its intermittent.  - He wants to  know if he needs a chest x-ray also at this time.   Patient denies:    - any other abnormal symptoms at time of call.   Interventions/Plan: - Encounter forwarded to primary cardiologist for review/recommendations.    Patient agrees with plan, no questions at this time

## 2024-05-11 NOTE — Telephone Encounter (Signed)
 New MessageL      Patient wants to know if Dr Renna Cary wants him to have a xray or lab work before his appointment on 05-15-24?n

## 2024-05-11 NOTE — Telephone Encounter (Signed)
 Patient identification verified by 2 forms. Sims Duck, RN               Interventions/Plan: - Chart review shows now labs/ testing required before upcoming appointment.  - Encounter forwarded to primary cardiologist for review.

## 2024-05-11 NOTE — Telephone Encounter (Signed)
 Patient says he got the recommendations about having labs but he is unsure whether he needs to have a chest xray. Please advise.

## 2024-05-12 ENCOUNTER — Ambulatory Visit: Payer: Medicare Other | Admitting: Podiatry

## 2024-05-12 NOTE — Addendum Note (Signed)
 Addended by: Fabianna Keats L on: 05/12/2024 01:58 PM   Modules accepted: Orders

## 2024-05-12 NOTE — Telephone Encounter (Signed)
 Darin Madura, MD to [name], RN     05/12/24  6:41 AM Sure. OK to order CXR PA and LAT -pleural effusion Dorothye Gathers, MD  CXR ordered- he prefers [facility]. Changed order to [facility]. Informed him that he can have the labs and x ray performed before his appt with the provider on 05/15/24.

## 2024-05-12 NOTE — Telephone Encounter (Signed)
 Patient is calling to verify if he needs to have the test completed before seeing Dr. Renna Cary. Please advise.

## 2024-05-12 NOTE — Progress Notes (Signed)
 Cardiac Individual Treatment Plan  Patient Details  Name: Darin Mcclure MRN: [id number] Date of Birth: 04/23/1941 Referring Provider:   Flowsheet Row INTENSIVE CARDIAC REHAB ORIENT from 04/09/2024 in [facility] for [facility], Vascular, [facility]  Referring Provider Dr. Dorothye Gathers, MD    Initial Encounter Date:  Flowsheet Row INTENSIVE CARDIAC REHAB ORIENT from 04/09/2024 in [facility] for Heart, Vascular, & Lung Health  Date 04/09/24    Visit Diagnosis: 01/21/24 S/P CABG x 4  Bradycardia  Patient's Home Medications on Admission:  Current Outpatient Medications:    acetaminophen  (TYLENOL ) 500 MG tablet, Take 1-2 tablets (500-1,000 mg total) by mouth every 6 (six) hours as needed., Disp: , Rfl:    amiodarone  (PACERONE ) 100 MG tablet, Take 1 tablet (100 mg total) by mouth daily., Disp: 90 tablet, Rfl: 3   apixaban  (ELIQUIS ) 5 MG TABS tablet, Take 1 tablet (5 mg total) by mouth 2 (two) times daily., Disp: 180 tablet, Rfl: 3   aspirin  EC 81 MG tablet, Take 81 mg by mouth daily., Disp: , Rfl:    atorvastatin  (LIPITOR) 40 MG tablet, Take 1 tablet (40 mg total) by mouth at bedtime., Disp: 30 tablet, Rfl: 3   bumetanide  (BUMEX ) 2 MG tablet, Take 1 tablet (2 mg total) by mouth daily. (Patient taking differently: Take 2 mg by mouth 4 (four) times a week.), Disp: 90 tablet, Rfl: 3   famotidine  (PEPCID ) 20 MG tablet, Take 1 tablet (20 mg total) by mouth daily. (Patient taking differently: Take 20 mg by mouth daily as needed for heartburn or indigestion.), Disp: 30 tablet, Rfl: 1   fluticasone  (FLONASE ) 50 MCG/ACT nasal spray, Place 1 spray into both nostrils daily as needed for allergies., Disp: , Rfl: 3   Inulin (FIBER CHOICE PO), Take 2 tablets by mouth daily., Disp: , Rfl:    JANUVIA 100 MG tablet, Take 100 mg by mouth daily., Disp: , Rfl:    levothyroxine  (SYNTHROID ) 75 MCG tablet, Take 75 mcg by mouth daily before breakfast., Disp: , Rfl:     Magnesium  500 MG CAPS, Take 1,000 mg by mouth daily., Disp: , Rfl:    metFORMIN  (GLUCOPHAGE ) 1000 MG tablet, Take 500 mg by mouth 2 (two) times daily., Disp: , Rfl:    NONFORMULARY OR COMPOUNDED ITEM, Topical antifungal topical solution: terbinafine 3%, fluconazole 2%, tea tree oil 5%, Urea 10%, ibuprofen2% in DMSO suspension. Sig: Apply to the affected toenail(s) once daily.  Order sent to [facility] [address] 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) 08/26/2017   GERD (gastroesophageal reflux disease)    Heart disease    Hyperglycemia    Hypertension    Mixed hyperlipidemia    Muscle cramp 08/26/2017   Neuropathy    Osteoarthritis    RBBB    RBBB (right bundle branch block)    Type 2 diabetes  mellitus (HCC)     Tobacco Use: Social History   Tobacco Use  Smoking Status Never  Smokeless Tobacco Never    Labs: Review Flowsheet  More data exists      Latest Ref Rng & Units 01/16/2024 01/20/2024 01/21/2024 01/23/2024 04/15/2024  Labs for ITP Cardiac and Pulmonary Rehab  Cholestrol 0 - 200 mg/dL 562  - - - -  LDL (calc) 0 - 99 mg/dL 16  - - - -  HDL-C >13 mg/dL 35  - - - -  Trlycerides <150 mg/dL 086  - - - -  PH, Arterial 7.35 - 7.45 - 7.43  7.308  7.289  7.346  7.359  7.302  7.330  7.339  - -  PCO2 arterial 32 - 48 mmHg - 35  41.7  38.9  29.9  33.7  41.2  40.6  42.5  - -  Bicarbonate 20.0 - 28.0 mmol/L - 23.2  20.8  18.6  16.4  19.0  20.4   21.4  22.9  - -  TCO2 22 - 32 mmol/L - - 22  20  17  20  22  25  23  22  23  23  24  24  22  22    Acid-base deficit 0.0 - 2.0 mmol/L - 0.6  5.0  7.0  8.0  6.0  6.0  4.0  3.0  - -  O2 Saturation % - 98.7  96  96  97  97  95  100  100  - -    Details       Multiple values from one day are sorted in reverse-chronological order         Capillary Blood Glucose: Lab Results  Component Value Date   GLUCAP 91 05/01/2024   GLUCAP 123 (H) 05/01/2024   GLUCAP 89 04/29/2024   GLUCAP 151 (H) 04/29/2024   GLUCAP 106 (H) 04/27/2024     Exercise Target Goals: Exercise Program Goal: Individual exercise prescription set using results from initial 6 min walk test and THRR while considering  patient's activity barriers and safety.   Exercise Prescription Goal: Initial exercise prescription builds to 30-45 minutes a day of aerobic activity, 2-3 days per week.  Home exercise guidelines will be given to patient during program as part of exercise prescription that the participant will acknowledge.  Activity Barriers & Risk Stratification:  Activity Barriers & Cardiac Risk Stratification - 04/09/24 1417       Activity Barriers & Cardiac Risk Stratification   Activity Barriers Joint Problems;Deconditioning;Arthritis;Back Problems;Shortness of Breath;Left Hip Replacement;Right Hip Replacement;Left Knee Replacement;Neck/Spine Problems;Balance Concerns    Cardiac Risk Stratification High          6 Minute Walk:  6 Minute Walk     Row Name 04/09/24 1414         6 Minute Walk   Phase Initial     Distance 790 feet     Walk Time 6 minutes     # of Rest Breaks 0     MPH 1.5     METS 1.2     RPE 12     Perceived Dyspnea  1     VO2 Peak 4.31     Symptoms No     Resting HR 55 bpm     Resting BP 114/84     Resting Oxygen Saturation  98 %     Exercise Oxygen Saturation  during 6 min walk 97 %     Max Ex.  HR 73 bpm     Max Ex. BP 152/64     2 Minute Post BP 140/64        Oxygen  Initial Assessment:   Oxygen Re-Evaluation:   Oxygen Discharge (Final Oxygen Re-Evaluation):   Initial Exercise Prescription:  Initial Exercise Prescription - 04/09/24 1400       Date of Initial Exercise RX and Referring Provider   Date 04/09/24    Referring Provider Dr. Dorothye Gathers, MD    Expected Discharge Date 07/01/24      NuStep   Level 1    SPM 65    Minutes 20    METs 1.3      Prescription Details   Frequency (times per week) 3    Duration Progress to 30 minutes of continuous aerobic without signs/symptoms of physical distress      Intensity   THRR 40-80% of Max Heartrate 56-111    Ratings of Perceived Exertion 11-13    Perceived Dyspnea 0-4      Progression   Progression Continue progressive overload as per policy without signs/symptoms or physical distress.      Resistance Training   Training Prescription Yes    Weight 2    Reps 10-15          Perform Capillary Blood Glucose checks as needed.  Exercise Prescription Changes:   Exercise Prescription Changes     Row Name 04/27/24 1400             Response to Exercise   Blood Pressure (Admit) 122/68       Blood Pressure (Exercise) 150/78       Blood Pressure (Exit) 120/70       Rating of Perceived Exertion (Exercise) 10       Symptoms None       Comments Pt's first day in the CRP2 program.       Duration Continue with 30 min of aerobic exercise without signs/symptoms of physical distress.       Intensity THRR unchanged         Progression   Progression Continue to progress workloads to maintain intensity without signs/symptoms of physical distress.       Average METs 2         Resistance Training   Training Prescription Yes       Weight 2 lbs       Reps 10-15       Time 5 Minutes         Interval Training   Interval Training No         NuStep   Level 1       SPM 85       Minutes 30       METs 2          Exercise Comments:   Exercise Comments     Row Name 04/27/24 1409            Exercise Comments Pt's first day in the CRP2 program. Pt exercised without complaints.          Exercise Goals and Review:   Exercise Goals     Row Name 04/09/24 1419             Exercise Goals   Increase Physical Activity Yes       Intervention Provide advice, education, support and counseling about physical activity/exercise needs.;Develop an individualized exercise prescription for aerobic and resistive training based on initial evaluation findings, risk stratification,  comorbidities and participant's personal goals.       Expected Outcomes Short Term: Attend rehab on a regular basis to increase amount of physical activity.;Long Term: Add in home exercise to make exercise part of routine and to increase amount of physical activity.;Long Term: Exercising regularly at least 3-5 days a week.       Increase Strength and Stamina Yes       Intervention Provide advice, education, support and counseling about physical activity/exercise needs.;Develop an individualized exercise prescription for aerobic and resistive training based on initial evaluation findings, risk stratification, comorbidities and participant's personal goals.       Expected Outcomes Short Term: Increase workloads from initial exercise prescription for resistance, speed, and METs.;Short Term: Perform resistance training exercises routinely during rehab and add in resistance training at home;Long Term: Improve cardiorespiratory fitness, muscular endurance and strength as measured by increased METs and functional capacity ( )       Able to understand and use rate of perceived exertion (RPE) scale Yes       Intervention Provide education and explanation on how to use RPE scale       Expected Outcomes Short Term: Able to use RPE daily in rehab to express subjective intensity level;Long Term:  Able to use RPE to guide intensity level when exercising independently       Able to understand and use Dyspnea scale Yes        Intervention Provide education and explanation on how to use Dyspnea scale       Expected Outcomes Short Term: Able to use Dyspnea scale daily in rehab to express subjective sense of shortness of breath during exertion;Long Term: Able to use Dyspnea scale to guide intensity level when exercising independently       Knowledge and understanding of Target Heart Rate Range (THRR) Yes       Intervention Provide education and explanation of THRR including how the numbers were predicted and where they are located for reference       Expected Outcomes Short Term: Able to state/look up THRR;Long Term: Able to use THRR to govern intensity when exercising independently;Short Term: Able to use daily as guideline for intensity in rehab       Understanding of Exercise Prescription Yes       Intervention Provide education, explanation, and written materials on patient's individual exercise prescription       Expected Outcomes Short Term: Able to explain program exercise prescription;Long Term: Able to explain home exercise prescription to exercise independently          Exercise Goals Re-Evaluation :  Exercise Goals Re-Evaluation     Row Name 04/27/24 1147             Exercise Goal Re-Evaluation   Exercise Goals Review Increase Physical Activity;Increase Strength and Stamina;Able to understand and use rate of perceived exertion (RPE) scale;Knowledge and understanding of Target Heart Rate Range (THRR);Understanding of Exercise Prescription       Comments Pt's first day in the CRP2 program. Pt understands the exercise Rx, THRR and RPE scale.       Expected Outcomes Will continue to monitor patient's progress and increase exercise workloads as tolerated.          Discharge Exercise Prescription (Final Exercise Prescription Changes):  Exercise Prescription Changes - 04/27/24 1400       Response to Exercise   Blood Pressure (Admit) 122/68    Blood Pressure (Exercise) 150/78    Blood Pressure (Exit)  120/70    Rating of Perceived Exertion (Exercise) 10    Symptoms None    Comments Pt's first day in the CRP2 program.    Duration Continue with 30 min of aerobic exercise without signs/symptoms of physical distress.    Intensity THRR unchanged      Progression   Progression Continue to progress workloads to maintain intensity without signs/symptoms of physical distress.    Average METs 2      Resistance Training   Training Prescription Yes    Weight 2 lbs    Reps 10-15    Time 5 Minutes      Interval Training   Interval Training No      NuStep   Level 1    SPM 85    Minutes 30    METs 2          Nutrition:  Target Goals: Understanding of nutrition guidelines, daily intake of sodium 1500mg , cholesterol 200mg , calories 30% from fat and 7% or less from saturated fats, daily to have 5 or more servings of fruits and vegetables.  Biometrics:  Pre Biometrics - 04/09/24 1420       Pre Biometrics   Height 6' (1.829 m)    Weight 97.5 kg    Waist Circumference 43 inches    Hip Circumference 43 inches    Waist to Hip Ratio 1 %    BMI (Calculated) 29.15    Triceps Skinfold 18 mm    % Body Fat 30.3 %    Grip Strength 30 kg    Flexibility 7 in    Single Leg Stand 4 seconds           Nutrition Therapy Plan and Nutrition Goals:  Nutrition Therapy & Goals - 04/15/24 1045       Nutrition Therapy   Diet Heart Healthy DIet    Drug/Food Interactions Statins/Certain Fruits      Personal Nutrition Goals   Nutrition Goal Patient to identify strategies for reducing cardiovascular risk by attending the [employer] education and nutrition series weekly.    Personal Goal #2 Patient to improve diet quality by using the plate method as a guide for meal planning to include lean protein/plant protein, fruits, vegetables, whole grains, nonfat dairy as part of a well-balanced diet.    Comments Boyde has medical history of DM2, CAD, HTN, CKD3, Hyperlipidemia, CHF, CABGx4. LDL is at goal.  A1c is well controlled in a pre-diabetic range. Patient will benefit from participation in intensive cardiac rehab for nutrition, exercise, and lifestyle modification.      Intervention Plan   Intervention Prescribe, educate and counsel regarding individualized specific dietary modifications aiming towards targeted core components such as weight, hypertension, lipid management, diabetes, heart failure and other comorbidities.;Nutrition handout(s) given to patient.    Expected Outcomes Short Term Goal: Understand basic principles of dietary content, such as calories, fat, sodium, cholesterol and nutrients.;Long Term Goal: Adherence to prescribed nutrition plan.          Nutrition Assessments:  Nutrition Assessments - 04/15/24 1052       Rate Your Plate Scores   Pre Score 58         MEDIFICTS Score Key: >=70 Need to make dietary changes  40-70 Heart Healthy Diet <= 40 Therapeutic Level Cholesterol Diet   Flowsheet Row INTENSIVE CARDIAC REHAB from 04/15/2024 in [facility] for Heart, Vascular, & Lung Health  Picture Your Plate Total Score on Admission 58   Picture Your  Plate Scores: <16 Unhealthy dietary pattern with much room for improvement. 41-50 Dietary pattern unlikely to meet recommendations for good health and room for improvement. 51-60 More healthful dietary pattern, with some room for improvement.  >60 Healthy dietary pattern, although there may be some specific behaviors that could be improved.    Nutrition Goals Re-Evaluation:  Nutrition Goals Re-Evaluation     Row Name 04/15/24 1045             Goals   Current Weight 214 lb 15.2 oz (97.5 kg)       Comment A1c 5.9, LDL 56, HDL 39, triglycerides 264       Expected Outcome Dustyn has medical history of DM2, CAD, HTN, CKD3, Hyperlipidemia, CHF, CABGx4. LDL is at goal. A1c is well controlled in a pre-diabetic range. Patient will benefit from participation in intensive cardiac rehab for  nutrition, exercise, and lifestyle modification.          Nutrition Goals Re-Evaluation:  Nutrition Goals Re-Evaluation     Row Name 04/15/24 1045             Goals   Current Weight 214 lb 15.2 oz (97.5 kg)       Comment A1c 5.9, LDL 56, HDL 39, triglycerides 264       Expected Outcome Simpson has medical history of DM2, CAD, HTN, CKD3, Hyperlipidemia, CHF, CABGx4. LDL is at goal. A1c is well controlled in a pre-diabetic range. Patient will benefit from participation in intensive cardiac rehab for nutrition, exercise, and lifestyle modification.          Nutrition Goals Discharge (Final Nutrition Goals Re-Evaluation):  Nutrition Goals Re-Evaluation - 04/15/24 1045       Goals   Current Weight 214 lb 15.2 oz (97.5 kg)    Comment A1c 5.9, LDL 56, HDL 39, triglycerides 264    Expected Outcome Guthrie has medical history of DM2, CAD, HTN, CKD3, Hyperlipidemia, CHF, CABGx4. LDL is at goal. A1c is well controlled in a pre-diabetic range. Patient will benefit from participation in intensive cardiac rehab for nutrition, exercise, and lifestyle modification.          Psychosocial: Target Goals: Acknowledge presence or absence of significant depression and/or stress, maximize coping skills, provide positive support system. Participant is able to verbalize types and ability to use techniques and skills needed for reducing stress and depression.  Initial Review & Psychosocial Screening:  Initial Psych Review & Screening - 04/09/24 1112       Initial Review   Current issues with Current Stress Concerns    Source of Stress Concerns Chronic Illness    Comments Davell has felt some stress regarding his recent open heart surgery. Vanden said that he overdid it this past weekend and was concerned about gaining weight. Gilbert says he has been experiening some shortness of breath since surgery.      Family Dynamics   Good Support System? Yes   Hudsyn has his wife Bobbye Burrow 3 children and 4 gnad children for  support     Barriers   Psychosocial barriers to participate in program The patient should benefit from training in stress management and relaxation.      Screening Interventions   Interventions Encouraged to exercise;Provide feedback about the scores to participant    Expected Outcomes Long Term Goal: Stressors or current issues are controlled or eliminated.;Short Term goal: Identification and review with participant of any Quality of Life or Depression concerns found by scoring the questionnaire.;Long Term goal: The  participant improves quality of Life and PHQ9 Scores as seen by post scores and/or verbalization of changes          Quality of Life Scores:  Quality of Life - 04/09/24 1425       Quality of Life   Select Quality of Life      Quality of Life Scores   Health/Function Pre 21.03 %    Socioeconomic Pre 25.75 %    Psych/Spiritual Pre 24.86 %    Family Pre 24 %    GLOBAL Pre 23.15 %         Scores of 19 and below usually indicate a poorer quality of life in these areas.  A difference of  2-3 points is a clinically meaningful difference.  A difference of 2-3 points in the total score of the Quality of Life Index has been associated with significant improvement in overall quality of life, self-image, physical symptoms, and general health in studies assessing change in quality of life.  PHQ-9: Review Flowsheet       04/09/2024  Depression screen PHQ 2/9  Decreased Interest 0  Down, Depressed, Hopeless 0  PHQ - 2 Score 0  Altered sleeping 0  Tired, decreased energy 1  Change in appetite 1  Feeling bad or failure about yourself  0  Trouble concentrating 0  Moving slowly or fidgety/restless 1  Suicidal thoughts 0  PHQ-9 Score 3  Difficult doing work/chores Somewhat difficult   Interpretation of Total Score  Total Score Depression Severity:  1-4 = Minimal depression, 5-9 = Mild depression, 10-14 = Moderate depression, 15-19 = Moderately severe depression, 20-27 =  Severe depression   Psychosocial Evaluation and Intervention:   Psychosocial Re-Evaluation:  Psychosocial Re-Evaluation     Row Name 04/29/24 1635 05/12/24 1247           Psychosocial Re-Evaluation   Current issues with Current Stress Concerns Current Stress Concerns      Comments Victor says he is not as short of breath since his recent hospitaliztion and his betablocker has been discontinued Giovanne says he still is experiencing som shortness of of breath aty times but generally feels beeter      Expected Outcomes Iden will have decreased or controlled stress upon completion of cardiac rehab Daishaun will have decreased or controlled stress upon completion of cardiac rehab      Interventions Stress management education;Relaxation education;Encouraged to attend Cardiac Rehabilitation for the exercise Stress management education;Relaxation education;Encouraged to attend Cardiac Rehabilitation for the exercise      Continue Psychosocial Services  Follow up required by staff Follow up required by staff        Initial Review   Source of Stress Concerns Chronic Illness;Unable to participate in former interests or hobbies;Unable to perform yard/household activities Chronic Illness;Unable to participate in former interests or hobbies;Unable to perform yard/household activities      Comments Will continue to monitor and offer support as needed. Will continue to monitor and offer support as needed.         Psychosocial Discharge (Final Psychosocial Re-Evaluation):  Psychosocial Re-Evaluation - 05/12/24 1247       Psychosocial Re-Evaluation   Current issues with Current Stress Concerns    Comments Kent says he still is experiencing som shortness of of breath aty times but generally feels beeter    Expected Outcomes Contrell will have decreased or controlled stress upon completion of cardiac rehab    Interventions Stress management education;Relaxation education;Encouraged to attend Cardiac Rehabilitation  for the exercise    Continue Psychosocial Services  Follow up required by staff      Initial Review   Source of Stress Concerns Chronic Illness;Unable to participate in former interests or hobbies;Unable to perform yard/household activities    Comments Will continue to monitor and offer support as needed.          Vocational Rehabilitation: Provide vocational rehab assistance to qualifying candidates.   Vocational Rehab Evaluation & Intervention:  Vocational Rehab - 04/09/24 1217       Initial Vocational Rehab Evaluation & Intervention   Assessment shows need for Vocational Rehabilitation No   Ignatius is retired and does not need vocational rehab at this time         Education: Education Goals: Education classes will be provided on a weekly basis, covering required topics. Participant will state understanding/return demonstration of topics presented.    Education     Row Name 04/15/24 1000     Education   Cardiac Education Topics [employer]   Secondary school teacher School   Educator Dietitian   Weekly Topic Personalizing Your Pritikin Plate   Instruction Review Code 1- Verbalizes Understanding   Class Start Time 0815   Class Stop Time 0850   Class Time Calculation (min) 35 min    Row Name 04/27/24 0800     Education   Cardiac Education Topics [employer]   Select Workshops     Workshops   Educator Exercise Physiologist   Select Exercise   Exercise Workshop Exercise Basics: Building Your Action Plan   Instruction Review Code 1- Verbalizes Understanding   Class Start Time 0810   Class Stop Time 0853   Class Time Calculation (min) 43 min    Row Name 04/29/24 0800     Education   Cardiac Education Topics [employer]   Orthoptist   Educator Dietitian   Weekly Topic Efficiency Cooking - Meals in a Snap   Instruction Review Code 1- Verbalizes Understanding   Class Start Time 0815   Class Stop Time 0850   Class Time  Calculation (min) 35 min    Row Name 05/01/24 0900     Education   Select Core Videos     Core Videos   Educator Exercise Physiologist   Select Exercise Education   Exercise Education Move It!   Instruction Review Code 1- Verbalizes Understanding   Class Start Time [phone]   Class Stop Time 0845   Class Time Calculation (min) 35 min    Row Name 05/04/24 0800     Education   Cardiac Education Topics [employer]   Glass blower/designer Nutrition   Nutrition Workshop Targeting Your Nutrition Priorities   Instruction Review Code 1- Verbalizes Understanding   Class Start Time 0815   Class Stop Time 0855   Class Time Calculation (min) 40 min    Row Name 05/06/24 0800     Education   Cardiac Education Topics [employer]in   Secondary school teacher School   Educator Dietitian   Weekly Topic One-Pot Wonders   Instruction Review Code 1- Verbalizes Understanding   Class Start Time 0815   Class Stop Time 0850   Class Time Calculation (min) 35 min    Row Name 05/08/24 0800     Education   Cardiac Education Topics P[employer]ikin   Select Core Videos  Core Videos   Educator Exercise Physiologist   Select General Education   General Education Hypertension and Heart Disease   Instruction Review Code 1- Verbalizes Understanding   Class Start Time [phone]   Class Stop Time 0847   Class Time Calculation (min) 35 min    Row Name 05/11/24 0800     Education   Cardiac Education Topics Pritikin   [employer] Workshops     Workshops   Educator Exercise Physiologist   [facility] Psychosocial   Psychosocial Workshop Focused Goals, Sustainable Changes   Instruction Review Code 1- Verbalizes Understanding   Class Start Time 0815   Class Stop Time 0850   Class Time Calculation (min) 35 min      Core Videos: Exercise    Move It!  Clinical staff conducted group or individual video education with verbal and written material and guidebook.   Patient learns the recommended Pritikin exercise program. Exercise with the goal of living a long, healthy life. Some of the health benefits of exercise include controlled diabetes, healthier blood pressure levels, improved cholesterol levels, improved heart and lung capacity, improved sleep, and better body composition. Everyone should speak with their doctor before starting or changing an exercise routine.  Biomechanical Limitations Clinical staff conducted group or individual video education with verbal and written material and guidebook.  Patient learns how biomechanical limitations can impact exercise and how we can mitigate and possibly overcome limitations to have an impactful and balanced exercise routine.  Body Composition Clinical staff conducted group or individual video education with verbal and written material and guidebook.  Patient learns that body composition (ratio of muscle mass to fat mass) is a key component to assessing overall fitness, rather than body weight alone. Increased fat mass, especially visceral belly fat, can put us  at increased risk for metabolic syndrome, type 2 diabetes, heart disease, and even death. It is recommended to combine diet and exercise (cardiovascular and resistance training) to improve your body composition. Seek guidance from your physician and exercise physiologist before implementing an exercise routine.  Exercise Action Plan Clinical staff conducted group or individual video education with verbal and written material and guidebook.  Patient learns the recommended strategies to achieve and enjoy long-term exercise adherence, including variety, self-motivation, self-efficacy, and positive decision making. Benefits of exercise include fitness, good health, weight management, more energy, better sleep, less stress, and overall well-being.  Medical   Heart Disease Risk Reduction Clinical staff conducted group or individual video education with verbal  and written material and guidebook.  Patient learns our heart is our most vital organ as it circulates oxygen, nutrients, white blood cells, and hormones throughout the entire body, and carries waste away. Data supports a plant-based eating plan like the Pritikin Program for its effectiveness in slowing progression of and reversing heart disease. The video provides a number of recommendations to address heart disease.   Metabolic Syndrome and Belly Fat  Clinical staff conducted group or individual video education with verbal and written material and guidebook.  Patient learns what metabolic syndrome is, how it leads to heart disease, and how one can reverse it and keep it from coming back. You have metabolic syndrome if you have 3 of the following 5 criteria: abdominal obesity, high blood pressure, high triglycerides, low HDL cholesterol, and high blood sugar.  Hypertension and Heart Disease Clinical staff conducted group or individual video education with verbal and written material and guidebook.  Patient learns that high blood pressure, or hypertension, is very common  in the [place] . Hypertension is largely due to excessive salt intake, but other important risk factors include being overweight, physical inactivity, drinking too much alcohol, smoking, and not eating enough potassium from fruits and vegetables. High blood pressure is a leading risk factor for heart attack, stroke, congestive heart failure, dementia, kidney failure, and premature death. Long-term effects of excessive salt intake include stiffening of the arteries and thickening of heart muscle and organ damage. Recommendations include ways to reduce hypertension and the risk of heart disease.  Diseases of Our Time - Focusing on Diabetes Clinical staff conducted group or individual video education with verbal and written material and guidebook.  Patient learns why the best way to stop diseases of our time is prevention, through  food and other lifestyle changes. Medicine (such as prescription pills and surgeries) is often only a Band-Aid on the problem, not a long-term solution. Most common diseases of our time include obesity, type 2 diabetes, hypertension, heart disease, and cancer. The Pritikin Program is recommended and has been proven to help reduce, reverse, and/or prevent the damaging effects of metabolic syndrome.  Nutrition   Overview of the Pritikin Eating Plan  Clinical staff conducted group or individual video education with verbal and written material and guidebook.  Patient learns about the Pritikin Eating Plan for disease risk reduction. The Pritikin Eating Plan emphasizes a wide variety of unrefined, minimally-processed carbohydrates, like fruits, vegetables, whole grains, and legumes. Go, Caution, and Stop food choices are explained. Plant-based and lean animal proteins are emphasized. Rationale provided for low sodium intake for blood pressure control, low added sugars for blood sugar stabilization, and low added fats and oils for coronary artery disease risk reduction and weight management.  Calorie Density  Clinical staff conducted group or individual video education with verbal and written material and guidebook.  Patient learns about calorie density and how it impacts the Pritikin Eating Plan. Knowing the characteristics of the food you choose will help you decide whether those foods will lead to weight gain or weight loss, and whether you want to consume more or less of them. Weight loss is usually a side effect of the Pritikin Eating Plan because of its focus on low calorie-dense foods.  Label Reading  Clinical staff conducted group or individual video education with verbal and written material and guidebook.  Patient learns about the Pritikin recommended label reading guidelines and corresponding recommendations regarding calorie density, added sugars, sodium content, and whole grains.  Dining Out -  Part 1  Clinical staff conducted group or individual video education with verbal and written material and guidebook.  Patient learns that restaurant meals can be sabotaging because they can be so high in calories, fat, sodium, and/or sugar. Patient learns recommended strategies on how to positively address this and avoid unhealthy pitfalls.  Facts on Fats  Clinical staff conducted group or individual video education with verbal and written material and guidebook.  Patient learns that lifestyle modifications can be just as effective, if not more so, as many medications for lowering your risk of heart disease. A Pritikin lifestyle can help to reduce your risk of inflammation and atherosclerosis (cholesterol build-up, or plaque, in the artery walls). Lifestyle interventions such as dietary choices and physical activity address the cause of atherosclerosis. A review of the types of fats and their impact on blood cholesterol levels, along with dietary recommendations to reduce fat intake is also included.  Nutrition Action Plan  Clinical staff conducted group or individual video education with  verbal and written material and guidebook.  Patient learns how to incorporate Pritikin recommendations into their lifestyle. Recommendations include planning and keeping personal health goals in mind as an important part of their success.  Healthy Mind-Set    Healthy Minds, Bodies, Hearts  Clinical staff conducted group or individual video education with verbal and written material and guidebook.  Patient learns how to identify when they are stressed. Video will discuss the impact of that stress, as well as the many benefits of stress management. Patient will also be introduced to stress management techniques. The way we think, act, and feel has an impact on our hearts.  How Our Thoughts Can Heal Our Hearts  Clinical staff conducted group or individual video education with verbal and written material and  guidebook.  Patient learns that negative thoughts can cause depression and anxiety. This can result in negative lifestyle behavior and serious health problems. Cognitive behavioral therapy is an effective method to help control our thoughts in order to change and improve our emotional outlook.  Additional Videos:  Exercise    Improving Performance  Clinical staff conducted group or individual video education with verbal and written material and guidebook.  Patient learns to use a non-linear approach by alternating intensity levels and lengths of time spent exercising to help burn more calories and lose more body fat. Cardiovascular exercise helps improve heart health, metabolism, hormonal balance, blood sugar control, and recovery from fatigue. Resistance training improves strength, endurance, balance, coordination, reaction time, metabolism, and muscle mass. Flexibility exercise improves circulation, posture, and balance. Seek guidance from your physician and exercise physiologist before implementing an exercise routine and learn your capabilities and proper form for all exercise.  Introduction to Yoga  Clinical staff conducted group or individual video education with verbal and written material and guidebook.  Patient learns about yoga, a discipline of the coming together of mind, breath, and body. The benefits of yoga include improved flexibility, improved range of motion, better posture and core strength, increased lung function, weight loss, and positive self-image. Yoga's heart health benefits include lowered blood pressure, healthier heart rate, decreased cholesterol and triglyceride levels, improved immune function, and reduced stress. Seek guidance from your physician and exercise physiologist before implementing an exercise routine and learn your capabilities and proper form for all exercise.  Medical   Aging: Enhancing Your Quality of Life  Clinical staff conducted group or individual  video education with verbal and written material and guidebook.  Patient learns key strategies and recommendations to stay in good physical health and enhance quality of life, such as prevention strategies, having an advocate, securing a Health Care Proxy and Power of Attorney, and keeping a list of medications and system for tracking them. It also discusses how to avoid risk for bone loss.  Biology of Weight Control  Clinical staff conducted group or individual video education with verbal and written material and guidebook.  Patient learns that weight gain occurs because we consume more calories than we burn (eating more, moving less). Even if your body weight is normal, you may have higher ratios of fat compared to muscle mass. Too much body fat puts you at increased risk for cardiovascular disease, heart attack, stroke, type 2 diabetes, and obesity-related cancers. In addition to exercise, following the Pritikin Eating Plan can help reduce your risk.  Decoding Lab Results  Clinical staff conducted group or individual video education with verbal and written material and guidebook.  Patient learns that lab test reflects one measurement whose  values change over time and are influenced by many factors, including medication, stress, sleep, exercise, food, hydration, pre-existing medical conditions, and more. It is recommended to use the knowledge from this video to become more involved with your lab results and evaluate your numbers to speak with your doctor.   Diseases of Our Time - Overview  Clinical staff conducted group or individual video education with verbal and written material and guidebook.  Patient learns that according to the CDC, 50% to 70% of chronic diseases (such as obesity, type 2 diabetes, elevated lipids, hypertension, and heart disease) are avoidable through lifestyle improvements including healthier food choices, listening to satiety cues, and increased physical activity.  Sleep  Disorders Clinical staff conducted group or individual video education with verbal and written material and guidebook.  Patient learns how good quality and duration of sleep are important to overall health and well-being. Patient also learns about sleep disorders and how they impact health along with recommendations to address them, including discussing with a physician.  Nutrition  Dining Out - Part 2 Clinical staff conducted group or individual video education with verbal and written material and guidebook.  Patient learns how to plan ahead and communicate in order to maximize their dining experience in a healthy and nutritious manner. Included are recommended food choices based on the type of restaurant the patient is visiting.   Fueling a Banker conducted group or individual video education with verbal and written material and guidebook.  There is a strong connection between our food choices and our health. Diseases like obesity and type 2 diabetes are very prevalent and are in large-part due to lifestyle choices. The Pritikin Eating Plan provides plenty of food and hunger-curbing satisfaction. It is easy to follow, affordable, and helps reduce health risks.  Menu Workshop  Clinical staff conducted group or individual video education with verbal and written material and guidebook.  Patient learns that restaurant meals can sabotage health goals because they are often packed with calories, fat, sodium, and sugar. Recommendations include strategies to plan ahead and to communicate with the manager, chef, or server to help order a healthier meal.  Planning Your Eating Strategy  Clinical staff conducted group or individual video education with verbal and written material and guidebook.  Patient learns about the Pritikin Eating Plan and its benefit of reducing the risk of disease. The Pritikin Eating Plan does not focus on calories. Instead, it emphasizes high-quality,  nutrient-rich foods. By knowing the characteristics of the foods, we choose, we can determine their calorie density and make informed decisions.  Targeting Your Nutrition Priorities  Clinical staff conducted group or individual video education with verbal and written material and guidebook.  Patient learns that lifestyle habits have a tremendous impact on disease risk and progression. This video provides eating and physical activity recommendations based on your personal health goals, such as reducing LDL cholesterol, losing weight, preventing or controlling type 2 diabetes, and reducing high blood pressure.  Vitamins and Minerals  Clinical staff conducted group or individual video education with verbal and written material and guidebook.  Patient learns different ways to obtain key vitamins and minerals, including through a recommended healthy diet. It is important to discuss all supplements you take with your doctor.   Healthy Mind-Set    Smoking Cessation  Clinical staff conducted group or individual video education with verbal and written material and guidebook.  Patient learns that cigarette smoking and tobacco addiction pose a serious health risk which  affects millions of people. Stopping smoking will significantly reduce the risk of heart disease, lung disease, and many forms of cancer. Recommended strategies for quitting are covered, including working with your doctor to develop a successful plan.  Culinary   Becoming a Set designer conducted group or individual video education with verbal and written material and guidebook.  Patient learns that cooking at home can be healthy, cost-effective, quick, and puts them in control. Keys to cooking healthy recipes will include looking at your recipe, assessing your equipment needs, planning ahead, making it simple, choosing cost-effective seasonal ingredients, and limiting the use of added fats, salts, and sugars.  Cooking -  Breakfast and Snacks  Clinical staff conducted group or individual video education with verbal and written material and guidebook.  Patient learns how important breakfast is to satiety and nutrition through the entire day. Recommendations include key foods to eat during breakfast to help stabilize blood sugar levels and to prevent overeating at meals later in the day. Planning ahead is also a key component.  Cooking - Educational psychologist conducted group or individual video education with verbal and written material and guidebook.  Patient learns eating strategies to improve overall health, including an approach to cook more at home. Recommendations include thinking of animal protein as a side on your plate rather than center stage and focusing instead on lower calorie dense options like vegetables, fruits, whole grains, and plant-based proteins, such as beans. Making sauces in large quantities to freeze for later and leaving the skin on your vegetables are also recommended to maximize your experience.  Cooking - Healthy Salads and Dressing Clinical staff conducted group or individual video education with verbal and written material and guidebook.  Patient learns that vegetables, fruits, whole grains, and legumes are the foundations of the Pritikin Eating Plan. Recommendations include how to incorporate each of these in flavorful and healthy salads, and how to create homemade salad dressings. Proper handling of ingredients is also covered. Cooking - Soups and [employer] - Soups and Desserts Clinical staff conducted group or individual video education with verbal and written material and guidebook.  Patient learns that Pritikin soups and desserts make for easy, nutritious, and delicious snacks and meal components that are low in sodium, fat, sugar, and calorie density, while high in vitamins, minerals, and filling fiber. Recommendations include simple and healthy ideas for soups and  desserts.   Overview     The Pritikin Solution Program Overview Clinical staff conducted group or individual video education with verbal and written material and guidebook.  Patient learns that the results of the Pritikin Program have been documented in more than 100 articles published in peer-reviewed journals, and the benefits include reducing risk factors for (and, in some cases, even reversing) high cholesterol, high blood pressure, type 2 diabetes, obesity, and more! An overview of the three key pillars of the Pritikin Program will be covered: eating well, doing regular exercise, and having a healthy mind-set.  WORKSHOPS  Exercise: Exercise Basics: Building Your Action Plan Clinical staff led group instruction and group discussion with PowerPoint presentation and patient guidebook. To enhance the learning environment the use of posters, models and videos may be added. At the conclusion of this workshop, patients will comprehend the difference between physical activity and exercise, as well as the benefits of incorporating both, into their routine. Patients will understand the FITT (Frequency, Intensity, Time, and Type) principle and how to use it to build  an exercise action plan. In addition, safety concerns and other considerations for exercise and cardiac rehab will be addressed by the presenter. The purpose of this lesson is to promote a comprehensive and effective weekly exercise routine in order to improve patients' overall level of fitness.   Managing Heart Disease: Your Path to a Healthier Heart Clinical staff led group instruction and group discussion with PowerPoint presentation and patient guidebook. To enhance the learning environment the use of posters, models and videos may be added.At the conclusion of this workshop, patients will understand the anatomy and physiology of the heart. Additionally, they will understand how Pritikin's three pillars impact the risk factors, the  progression, and the management of heart disease.  The purpose of this lesson is to provide a high-level overview of the heart, heart disease, and how the Pritikin lifestyle positively impacts risk factors.  Exercise Biomechanics Clinical staff led group instruction and group discussion with PowerPoint presentation and patient guidebook. To enhance the learning environment the use of posters, models and videos may be added. Patients will learn how the structural parts of their bodies function and how these functions impact their daily activities, movement, and exercise. Patients will learn how to promote a neutral spine, learn how to manage pain, and identify ways to improve their physical movement in order to promote healthy living. The purpose of this lesson is to expose patients to common physical limitations that impact physical activity. Participants will learn practical ways to adapt and manage aches and pains, and to minimize their effect on regular exercise. Patients will learn how to maintain good posture while sitting, walking, and lifting.  Balance Training and Fall Prevention  Clinical staff led group instruction and group discussion with PowerPoint presentation and patient guidebook. To enhance the learning environment the use of posters, models and videos may be added. At the conclusion of this workshop, patients will understand the importance of their sensorimotor skills (vision, proprioception, and the vestibular system) in maintaining their ability to balance as they age. Patients will apply a variety of balancing exercises that are appropriate for their current level of function. Patients will understand the common causes for poor balance, possible solutions to these problems, and ways to modify their physical environment in order to minimize their fall risk. The purpose of this lesson is to teach patients about the importance of maintaining balance as they age and ways to  minimize their risk of falling.  WORKSHOPS   Nutrition:  Fueling a Ship broker led group instruction and group discussion with PowerPoint presentation and patient guidebook. To enhance the learning environment the use of posters, models and videos may be added. Patients will review the foundational principles of the Pritikin Eating Plan and understand what constitutes a serving size in each of the food groups. Patients will also learn Pritikin-friendly foods that are better choices when away from home and review make-ahead meal and snack options. Calorie density will be reviewed and applied to three nutrition priorities: weight maintenance, weight loss, and weight gain. The purpose of this lesson is to reinforce (in a group setting) the key concepts around what patients are recommended to eat and how to apply these guidelines when away from home by planning and selecting Pritikin-friendly options. Patients will understand how calorie density may be adjusted for different weight management goals.  Mindful Eating  Clinical staff led group instruction and group discussion with PowerPoint presentation and patient guidebook. To enhance the learning environment the use of posters, models  and videos may be added. Patients will briefly review the concepts of the Pritikin Eating Plan and the importance of low-calorie dense foods. The concept of mindful eating will be introduced as well as the importance of paying attention to internal hunger signals. Triggers for non-hunger eating and techniques for dealing with triggers will be explored. The purpose of this lesson is to provide patients with the opportunity to review the basic principles of the Pritikin Eating Plan, discuss the value of eating mindfully and how to measure internal cues of hunger and fullness using the Hunger Scale. Patients will also discuss reasons for non-hunger eating and learn strategies to use for controlling emotional  eating.  Targeting Your Nutrition Priorities Clinical staff led group instruction and group discussion with PowerPoint presentation and patient guidebook. To enhance the learning environment the use of posters, models and videos may be added. Patients will learn how to determine their genetic susceptibility to disease by reviewing their family history. Patients will gain insight into the importance of diet as part of an overall healthy lifestyle in mitigating the impact of genetics and other environmental insults. The purpose of this lesson is to provide patients with the opportunity to assess their personal nutrition priorities by looking at their family history, their own health history and current risk factors. Patients will also be able to discuss ways of prioritizing and modifying the Pritikin Eating Plan for their highest risk areas  Menu  Clinical staff led group instruction and group discussion with PowerPoint presentation and patient guidebook. To enhance the learning environment the use of posters, models and videos may be added. Using menus brought in from [employer], or printed from [employer], patients will apply the Pritikin dining out guidelines that were presented in the [employer] video. Patients will also be able to practice these guidelines in a variety of provided scenarios. The purpose of this lesson is to provide patients with the opportunity to practice hands-on learning of the Pritikin Dining Out guidelines with actual menus and practice scenarios.  Label Reading Clinical staff led group instruction and group discussion with PowerPoint presentation and patient guidebook. To enhance the learning environment the use of posters, models and videos may be added. Patients will review and discuss the Pritikin label reading guidelines presented in [employer]'s Label Reading Educational series video. Using fool labels brought in from local grocery stores and  markets, patients will apply the label reading guidelines and determine if the packaged food meet the Pritikin guidelines. The purpose of this lesson is to provide patients with the opportunity to review, discuss, and practice hands-on learning of the Pritikin Label Reading guidelines with actual packaged food labels. Cooking School  Pritikin's [employer] are designed to teach patients ways to prepare quick, simple, and affordable recipes at home. The importance of nutrition's role in chronic disease risk reduction is reflected in its emphasis in the overall Pritikin program. By learning how to prepare essential core Pritikin Eating Plan recipes, patients will increase control over what they eat; be able to customize the flavor of foods without the use of added salt, sugar, or fat; and improve the quality of the food they consume. By learning a set of core recipes which are easily assembled, quickly prepared, and affordable, patients are more likely to prepare more healthy foods at home. These workshops focus on convenient breakfasts, simple entres, side dishes, and desserts which can be prepared with minimal effort and are consistent with nutrition recommendations for cardiovascular risk  reduction. Cooking [employer] are taught by a Armed forces logistics/support/administrative officer (RD) who has been trained by the [employer]. The chef or RD has a clear understanding of the importance of minimizing - if not completely eliminating - added fat, sugar, and sodium in recipes. Throughout the series of Cooking School Workshop sessions, patients will learn about healthy ingredients and efficient methods of cooking to build confidence in their capability to prepare    Cooking School weekly topics:  Adding Flavor- Sodium-Free  Fast and Healthy Breakfasts  Powerhouse Plant-Based Proteins  Satisfying Salads and Dressings  Simple Sides and Sauces  International Cuisine-Spotlight on the [employer]  Zones  Delicious Desserts  Savory Soups  [employer] - Meals in a Astronomer Appetizers and Snacks  Comforting Weekend Breakfasts  One-Pot Wonders   Fast Evening Meals  Landscape architect Your Pritikin Plate  WORKSHOPS   Healthy Mindset (Psychosocial):  Focused Goals, Sustainable Changes Clinical staff led group instruction and group discussion with PowerPoint presentation and patient guidebook. To enhance the learning environment the use of posters, models and videos may be added. Patients will be able to apply effective goal setting strategies to establish at least one personal goal, and then take consistent, meaningful action toward that goal. They will learn to identify common barriers to achieving personal goals and develop strategies to overcome them. Patients will also gain an understanding of how our mind-set can impact our ability to achieve goals and the importance of cultivating a positive and growth-oriented mind-set. The purpose of this lesson is to provide patients with a deeper understanding of how to set and achieve personal goals, as well as the tools and strategies needed to overcome common obstacles which may arise along the way.  From Head to Heart: The Power of a Healthy Outlook  Clinical staff led group instruction and group discussion with PowerPoint presentation and patient guidebook. To enhance the learning environment the use of posters, models and videos may be added. Patients will be able to recognize and describe the impact of emotions and mood on physical health. They will discover the importance of self-care and explore self-care practices which may work for them. Patients will also learn how to utilize the 4 C's to cultivate a healthier outlook and better manage stress and challenges. The purpose of this lesson is to demonstrate to patients how a healthy outlook is an essential part of maintaining good health, especially as they continue their  cardiac rehab journey.  Healthy Sleep for a Healthy Heart Clinical staff led group instruction and group discussion with PowerPoint presentation and patient guidebook. To enhance the learning environment the use of posters, models and videos may be added. At the conclusion of this workshop, patients will be able to demonstrate knowledge of the importance of sleep to overall health, well-being, and quality of life. They will understand the symptoms of, and treatments for, common sleep disorders. Patients will also be able to identify daytime and nighttime behaviors which impact sleep, and they will be able to apply these tools to help manage sleep-related challenges. The purpose of this lesson is to provide patients with a general overview of sleep and outline the importance of quality sleep. Patients will learn about a few of the most common sleep disorders. Patients will also be introduced to the concept of "sleep hygiene," and discover ways to self-manage certain sleeping problems through simple daily behavior changes. Finally, the workshop will motivate patients by clarifying the links between  quality sleep and their goals of heart-healthy living.   Recognizing and Reducing Stress Clinical staff led group instruction and group discussion with PowerPoint presentation and patient guidebook. To enhance the learning environment the use of posters, models and videos may be added. At the conclusion of this workshop, patients will be able to understand the types of stress reactions, differentiate between acute and chronic stress, and recognize the impact that chronic stress has on their health. They will also be able to apply different coping mechanisms, such as reframing negative self-talk. Patients will have the opportunity to practice a variety of stress management techniques, such as deep abdominal breathing, progressive muscle relaxation, and/or guided imagery.  The purpose of this lesson is to educate  patients on the role of stress in their lives and to provide healthy techniques for coping with it.  Learning Barriers/Preferences:  Learning Barriers/Preferences - 04/09/24 1429       Learning Barriers/Preferences   Learning Barriers Exercise Concerns   Unsteady balance, 4 seconds on single leg stand   Learning Preferences Verbal Instruction;Video;Skilled Demonstration;Group Instruction;Individual Instruction;Audio          Education Topics:  Knowledge Questionnaire Score:  Knowledge Questionnaire Score - 04/09/24 1430       Knowledge Questionnaire Score   Pre Score 24/24          Core Components/Risk Factors/Patient Goals at Admission:  Personal Goals and Risk Factors at Admission - 04/09/24 1430       Core Components/Risk Factors/Patient Goals on Admission    Weight Management Yes;Obesity;Weight Loss    Intervention Weight Management: Develop a combined nutrition and exercise program designed to reach desired caloric intake, while maintaining appropriate intake of nutrient and fiber, sodium and fats, and appropriate energy expenditure required for the weight goal.;Weight Management: Provide education and appropriate resources to help participant work on and attain dietary goals.;Weight Management/Obesity: Establish reasonable short term and long term weight goals.;Obesity: Provide education and appropriate resources to help participant work on and attain dietary goals.    Admit Weight 214 lb 15.2 oz (97.5 kg)    Expected Outcomes Short Term: Continue to assess and modify interventions until short term weight is achieved;Long Term: Adherence to nutrition and physical activity/exercise program aimed toward attainment of established weight goal;Weight Loss: Understanding of general recommendations for a balanced deficit meal plan, which promotes 1-2 lb weight loss per week and includes a negative energy balance of [phone] kcal/d;Understanding recommendations for meals to include  15-35% energy as protein, 25-35% energy from fat, 35-60% energy from carbohydrates, less than 200mg  of dietary cholesterol, 20-35 gm of total fiber daily;Understanding of distribution of calorie intake throughout the day with the consumption of 4-5 meals/snacks    Diabetes Yes    Intervention Provide education about signs/symptoms and action to take for hypo/hyperglycemia.;Provide education about proper nutrition, including hydration, and aerobic/resistive exercise prescription along with prescribed medications to achieve blood glucose in normal ranges: Fasting glucose 65-99 mg/dL    Expected Outcomes Short Term: Participant verbalizes understanding of the signs/symptoms and immediate care of hyper/hypoglycemia, proper foot care and importance of medication, aerobic/resistive exercise and nutrition plan for blood glucose control.;Long Term: Attainment of HbA1C < 7%.    Hypertension Yes    Intervention Provide education on lifestyle modifcations including regular physical activity/exercise, weight management, moderate sodium restriction and increased consumption of fresh fruit, vegetables, and low fat dairy, alcohol moderation, and smoking cessation.;Monitor prescription use compliance.    Expected Outcomes Short Term: Continued assessment and intervention until  BP is < 140/36mm HG in hypertensive participants. < 130/19mm HG in hypertensive participants with diabetes, heart failure or chronic kidney disease.;Long Term: Maintenance of blood pressure at goal levels.    Lipids Yes    Intervention Provide education and support for participant on nutrition & aerobic/resistive exercise along with prescribed medications to achieve LDL 70mg , HDL >40mg .    Expected Outcomes Short Term: Participant states understanding of desired cholesterol values and is compliant with medications prescribed. Participant is following exercise prescription and nutrition guidelines.;Long Term: Cholesterol controlled with medications  as prescribed, with individualized exercise RX and with personalized nutrition plan. Value goals: LDL < 70mg , HDL > 40 mg.    Stress Yes    Intervention Offer individual and/or small group education and counseling on adjustment to heart disease, stress management and health-related lifestyle change. Teach and support self-help strategies.;Refer participants experiencing significant psychosocial distress to appropriate mental health specialists for further evaluation and treatment. When possible, include family members and significant others in education/counseling sessions.    Expected Outcomes Short Term: Participant demonstrates changes in health-related behavior, relaxation and other stress management skills, ability to obtain effective social support, and compliance with psychotropic medications if prescribed.;Long Term: Emotional wellbeing is indicated by absence of clinically significant psychosocial distress or social isolation.    Personal Goal Other Yes    Personal Goal Short:knowledge on diet, balance. Long: Under 200lbs, tone muscle    Intervention Willc ontinue to monitor pt and progress workloads as tolerated without sign or symptom    Expected Outcomes Pt will achieve his goals and gain strength          Core Components/Risk Factors/Patient Goals Review:   Goals and Risk Factor Review     Row Name 04/29/24 1640 05/12/24 1250           Core Components/Risk Factors/Patient Goals Review   Personal Goals Review Weight Management/Obesity;Lipids;Hypertension;Diabetes;Stress;Improve shortness of breath with ADL's Weight Management/Obesity;Lipids;Hypertension;Diabetes;Stress;Improve shortness of breath with ADL's      Review Ronit started cardiac rehab on 04/27/24 Ronak is off to a fair start to exercise for his fitness level. Vital signs and CBG's have been stable. Aithan continues to do well with exercise at cardiac rehab,  Vital signs and CBG's have been stable. Benyamin has lost 1.2 kg since  starting the program. Zerick has increased his met levels.      Expected Outcomes Kayon will continue to participate in cardiac rehab for exercise, nutrition and lifestyle modifications Merick will continue to participate in cardiac rehab for exercise, nutrition and lifestyle modifications         Core Components/Risk Factors/Patient Goals at Discharge (Final Review):   Goals and Risk Factor Review - 05/12/24 1250       Core Components/Risk Factors/Patient Goals Review   Personal Goals Review Weight Management/Obesity;Lipids;Hypertension;Diabetes;Stress;Improve shortness of breath with ADL's    Review Koston continues to do well with exercise at cardiac rehab,  Vital signs and CBG's have been stable. Eduin has lost 1.2 kg since starting the program. Rishik has increased his met levels.    Expected Outcomes Draxton will continue to participate in cardiac rehab for exercise, nutrition and lifestyle modifications          ITP Comments:  ITP Comments     Row Name 04/09/24 1214 04/15/24 0954 04/29/24 1630 05/12/24 1246     ITP Comments Introduction to P[facility]ikin Education Program/Intensive Cardiac Rehab. Initial orientation packet reviewed with the patient 30 Day ITP Review. Patient reported for first day  of exercise profound bradycardia noted. Onsite provider evaluated the patient. 12 lead ECG obtained. EMS called patient was transported to the ED for further evaluation 30 Day ITP Review. Devondre started cardiac rehab on 04/27/24 and is off to a good start to exercise for his fitness level. 30 Day ITP Review. Emmitte has good attendance and participation with exercise at cardiac rehab       Comments: See ITP comments.Monte Antonio RN BSN

## 2024-05-13 ENCOUNTER — Encounter (HOSPITAL_COMMUNITY)

## 2024-05-13 ENCOUNTER — Other Ambulatory Visit (HOSPITAL_COMMUNITY): Payer: Self-pay | Admitting: Family Medicine

## 2024-05-13 ENCOUNTER — Ambulatory Visit (HOSPITAL_BASED_OUTPATIENT_CLINIC_OR_DEPARTMENT_OTHER)
Admission: RE | Admit: 2024-05-13 | Discharge: 2024-05-13 | Disposition: A | Discharge status: Home / Self Care | Source: Ambulatory Visit | Attending: Cardiology | Admitting: Cardiology

## 2024-05-13 ENCOUNTER — Encounter (HOSPITAL_COMMUNITY)
Admission: RE | Admit: 2024-05-13 | Discharge: 2024-05-13 | Disposition: A | Discharge status: Home / Self Care | Source: Ambulatory Visit | Attending: Cardiology

## 2024-05-13 DIAGNOSIS — J90 Pleural effusion, not elsewhere classified: Secondary | ICD-10-CM | POA: Insufficient documentation

## 2024-05-13 DIAGNOSIS — R001 Bradycardia, unspecified: Secondary | ICD-10-CM

## 2024-05-13 DIAGNOSIS — Z951 Presence of aortocoronary bypass graft: Secondary | ICD-10-CM

## 2024-05-13 DIAGNOSIS — Z48812 Encounter for surgical aftercare following surgery on the circulatory system: Secondary | ICD-10-CM | POA: Diagnosis not present

## 2024-05-14 ENCOUNTER — Ambulatory Visit: Admitting: Physician Assistant

## 2024-05-14 LAB — BASIC METABOLIC PANEL WITH GFR
BUN/Creatinine Ratio: 14 (ref 10–24)
BUN: 21 mg/dL (ref 8–27)
CO2: 19 mmol/L — ABNORMAL LOW (ref 20–29)
Calcium: 9.4 mg/dL (ref 8.6–10.2)
Chloride: 102 mmol/L (ref 96–106)
Creatinine, Ser: 1.52 mg/dL — ABNORMAL HIGH (ref 0.76–1.27)
Glucose: 100 mg/dL — ABNORMAL HIGH (ref 70–99)
Potassium: 4.7 mmol/L (ref 3.5–5.2)
Sodium: 140 mmol/L (ref 134–144)
eGFR: 46 mL/min/{1.73_m2} — ABNORMAL LOW (ref >59)

## 2024-05-14 LAB — CBC
Hematocrit: 40.2 % (ref 37.5–51.0)
Hemoglobin: 12.6 g/dL — ABNORMAL LOW (ref 13.0–17.7)
MCH: 29.3 pg (ref 26.6–33.0)
MCHC: 31.3 g/dL — ABNORMAL LOW (ref 31.5–35.7)
MCV: 94 fL (ref 79–97)
Platelets: 178 10*3/uL (ref 150–450)
RBC: 4.3 x10E6/uL (ref 4.14–5.80)
RDW: 14.9 % (ref 11.6–15.4)
WBC: 6.8 10*3/uL (ref 3.4–10.8)

## 2024-05-15 ENCOUNTER — Encounter (HOSPITAL_COMMUNITY)

## 2024-05-15 ENCOUNTER — Ambulatory Visit (HOSPITAL_BASED_OUTPATIENT_CLINIC_OR_DEPARTMENT_OTHER): Admitting: Cardiology

## 2024-05-15 ENCOUNTER — Ambulatory Visit (HOSPITAL_BASED_OUTPATIENT_CLINIC_OR_DEPARTMENT_OTHER): Payer: Self-pay | Admitting: *Deleted

## 2024-05-15 ENCOUNTER — Encounter (HOSPITAL_BASED_OUTPATIENT_CLINIC_OR_DEPARTMENT_OTHER): Payer: Self-pay | Admitting: Cardiology

## 2024-05-15 VITALS — BP 138/70 | HR 78 | Ht 72.0 in | Wt 213.6 lb

## 2024-05-15 DIAGNOSIS — Z951 Presence of aortocoronary bypass graft: Secondary | ICD-10-CM | POA: Diagnosis not present

## 2024-05-15 DIAGNOSIS — I495 Sick sinus syndrome: Secondary | ICD-10-CM

## 2024-05-15 DIAGNOSIS — I2510 Atherosclerotic heart disease of native coronary artery without angina pectoris: Secondary | ICD-10-CM

## 2024-05-15 DIAGNOSIS — J90 Pleural effusion, not elsewhere classified: Secondary | ICD-10-CM

## 2024-05-15 DIAGNOSIS — I480 Paroxysmal atrial fibrillation: Secondary | ICD-10-CM | POA: Diagnosis not present

## 2024-05-15 MED ORDER — APIXABAN 5 MG PO TABS: 5.0000 mg | ORAL_TABLET | Freq: Two times a day (BID) | ORAL | 3 refills | Status: DC

## 2024-05-15 MED ORDER — BUMETANIDE 2 MG PO TABS: 2.0000 mg | ORAL_TABLET | ORAL | 3 refills | Status: DC | PRN

## 2024-05-15 NOTE — Patient Instructions (Addendum)
 Medication Instructions:  Please discontinue your Amiodarone . Take Bumex  as needed for a 2 to 3 lb overnight weight gain. Continue all other medications as listed.  *If you need a refill on your cardiac medications before your next appointment, please call your pharmacy*  Follow-Up: At [facility], you and your health needs are our priority.  As part of our continuing mission to provide you with exceptional heart care, our providers are all part of one team.  This team includes your primary Cardiologist (physician) and Advanced Practice Providers or APPs (Physician Assistants and Nurse Practitioners) who all work together to provide you with the care you need, when you need it.  Your next appointment:   6 month(s)  Provider:   One of our Advanced Practice Providers (APPs): Melita Springer, PA-C  Friddie Jetty, NP Evaline Hill, NP  Theotis Flake, PA-C Lawana Pray, NP  Willis Harter, PA-C Lovette Rud, PA-C  [place], PA-C Ernest Dick, NP  Marlana Silvan, NP Marcie Sever, PA-C  Laquita Plant, PA-C    Dayna Dunn, PA-C  Scott Weaver, PA-C Palmer Bobo, NP Katlyn West, NP Callie Goodrich, PA-C  Evan Williams, PA-C Sheng Haley, PA-C  Xika Zhao, NP Kathleen Johnson, PA-C   Then, Dorothye Gathers, MD will plan to see you again in 1 year(s).    We recommend signing up for the patient portal called MyChart.  Sign up information is provided on this After Visit Summary.  MyChart is used to connect with patients for Virtual Visits (Telemedicine).  Patients are able to view lab/test results, encounter notes, upcoming appointments, etc.  Non-urgent messages can be sent to your provider as well.   To learn more about what you can do with MyChart, go to [email or web address].

## 2024-05-15 NOTE — Progress Notes (Signed)
 Cardiology Office Note:  .   Date:  05/15/2024  ID:  Darin Mcclure, DOB May 28, 1941, MRN [id number] PCP: [name], MD  [name] Providers Cardiologist:  Dorothye Gathers, MD    History of Present Illness: Darin Mcclure   [name] is a 83 y.o. male Discussed the use of AI scribe software for clinical note transcription with the patient, who gave verbal consent to proceed.  History of Present Illness Darin Mcclure is an 83 year old male with atrial fibrillation, hypertension, hyperlipidemia, diabetes mellitus, and chronic kidney disease who presents for cardiovascular follow-up.  He underwent coronary artery bypass grafting (CABG) on January 15, 2024, which included a maze procedure and left atrial appendage clip. Postoperatively, he developed atrial fibrillation/flutter and was placed on amiodarone . He also experienced a seroma at the vein harvest site on his left lower extremity, which is improved.  Following the surgery, he had a pleural effusion that has since resolved. In May, he experienced bradycardia with heart rates in the thirties and was monitored in cardiac rehab. Telemetry during an overnight hospital stay showed a return to normal sinus rhythm and sinus bradycardia after discontinuation of Toprol , which he was taking at a dose of 50 mg daily.  Resolved.  Currently sinus rhythm.  He has chronic kidney disease stage 3B, with a recent improvement in creatinine levels from approximately 1.8 to 1.52. His potassium level is 4.7, and his hemoglobin is 12.6, showing improvement from a prior BMP of 580.  Had lengthy discussion today.      Studies Reviewed: Darin Mcclure   EKG Interpretation Date/Time:  Friday May 15 2024 08:12:16 EDT Ventricular Rate:  70 PR Interval:  146 QRS Duration:  156 QT Interval:  450 QTC Calculation: 486 R Axis:   46  Text Interpretation: Normal sinus rhythm Right bundle branch block When compared with ECG of 08-May-2024 16:00, Sinus rhythm is  now present Confirmed by Dorothye Gathers ([id number]) on 05/15/2024 8:19:17 AM    Results LABS Creatinine: 1.52 mg/dL Potassium: 4.7 mmol/L Hemoglobin: 12.6 g/dL  RADIOLOGY Chest x-ray: trivial effusion (05/15/2024) Risk Assessment/Calculations:            Physical Exam:   VS:  BP 138/70   Pulse 78   Ht 6' (1.829 m)   Wt 213 lb 9.6 oz (96.9 kg)   SpO2 96%   BMI 28.97 kg/m    Wt Readings from Last 3 Encounters:  05/15/24 213 lb 9.6 oz (96.9 kg)  05/08/24 213 lb 6.4 oz (96.8 kg)  04/16/24 209 lb 7 oz (95 kg)    GEN: Well nourished, well developed in no acute distress NECK: No JVD; No carotid bruits CARDIAC: RRR, no murmurs, no rubs, no gallops RESPIRATORY:  Clear to auscultation without rales, wheezing or rhonchi  ABDOMEN: Soft, non-tender, non-distended EXTREMITIES:  No edema; No deformity   ASSESSMENT AND PLAN: .    Assessment and Plan Assessment & Plan Coronary artery disease - Post CABG doing great.  We discussed continued incentive spirometry type exercises to help maintain deep breath.  Bumex  as needed.  Off Toprol .  Continue with aspirin  81 mg post bypass.  Chronic anticoagulation - Has had left atrial appendage clipping.  Technically he is 3 months postoperative and should be able to discontinue Eliquis  in this setting.  We discussed at length.  He would like to continue the Eliquis  until he runs out.  He has had minor bruising on his arms.  This seems reasonable.  At 71-month  follow-up we will check an EKG to make sure he is maintaining sinus rhythm.  Atrial fibrillation/flutter Postoperative atrial fibrillation/flutter managed with amiodarone . Previously experienced bradycardia with heart rates in the thirties in May during cardiac rehab. Telemetry during overnight hospital stay showed return to normal sinus rhythm and sinus bradycardia after Toprol  washout. - Okay to stop amiodarone .  He had been decreased to 100 mg a day at previous visit.  He is currently in sinus  rhythm. -2 prior cardioversions.   Hypertension Excellent control currently.  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 Potassium level is 4.7. Continue with Bumex  and a as needed fashion.  Weight gain 2 to 3 pounds take medication.  Diabetes mellitus Diabetes mellitus managed with Januvia and Metformin .  No changes  Hyperlipidemia Hyperlipidemia managed with atorvastatin .  Seroma at vein harvest site Seroma at the left lower extremity vein harvest site postoperatively.  Improved.  Pleural effusion Pleural effusion, now improved and basically resolved. Recent chest x-ray showed only trivial effusion.  Personally reviewed and interpreted, showed him images as well.  Hyperlipidemia - Continue with atorvastatin  40 mg a day with LDL goal less than 70.            Signed, Dorothye Gathers, MD

## 2024-05-18 ENCOUNTER — Encounter (HOSPITAL_COMMUNITY)
Admission: RE | Admit: 2024-05-18 | Discharge: 2024-05-18 | Disposition: A | Discharge status: Home / Self Care | Source: Ambulatory Visit | Attending: Cardiology | Admitting: Cardiology

## 2024-05-18 ENCOUNTER — Encounter (HOSPITAL_COMMUNITY)

## 2024-05-18 DIAGNOSIS — Z951 Presence of aortocoronary bypass graft: Secondary | ICD-10-CM

## 2024-05-18 DIAGNOSIS — Z48812 Encounter for surgical aftercare following surgery on the circulatory system: Secondary | ICD-10-CM | POA: Diagnosis not present

## 2024-05-19 NOTE — Addendum Note (Signed)
 Encounter addended by: Malvina Pina A on: 05/19/2024 7:43 AM  Actions taken: Imaging Exam ended

## 2024-05-20 ENCOUNTER — Encounter (HOSPITAL_COMMUNITY)
Admission: RE | Admit: 2024-05-20 | Discharge: 2024-05-20 | Disposition: A | Discharge status: Home / Self Care | Source: Ambulatory Visit | Attending: Cardiology | Admitting: Cardiology

## 2024-05-20 ENCOUNTER — Encounter (HOSPITAL_COMMUNITY)

## 2024-05-20 DIAGNOSIS — Z48812 Encounter for surgical aftercare following surgery on the circulatory system: Secondary | ICD-10-CM | POA: Diagnosis not present

## 2024-05-20 DIAGNOSIS — R001 Bradycardia, unspecified: Secondary | ICD-10-CM

## 2024-05-20 DIAGNOSIS — Z951 Presence of aortocoronary bypass graft: Secondary | ICD-10-CM

## 2024-05-22 ENCOUNTER — Encounter (HOSPITAL_COMMUNITY)
Admission: RE | Admit: 2024-05-22 | Discharge: 2024-05-22 | Disposition: A | Discharge status: Home / Self Care | Source: Ambulatory Visit | Attending: Cardiology | Admitting: Cardiology

## 2024-05-22 ENCOUNTER — Encounter (HOSPITAL_COMMUNITY)

## 2024-05-22 DIAGNOSIS — Z951 Presence of aortocoronary bypass graft: Secondary | ICD-10-CM

## 2024-05-22 DIAGNOSIS — Z48812 Encounter for surgical aftercare following surgery on the circulatory system: Secondary | ICD-10-CM | POA: Diagnosis not present

## 2024-05-25 ENCOUNTER — Encounter (HOSPITAL_COMMUNITY)

## 2024-05-25 ENCOUNTER — Encounter (HOSPITAL_COMMUNITY)
Admission: RE | Admit: 2024-05-25 | Discharge: 2024-05-25 | Disposition: A | Discharge status: Home / Self Care | Source: Ambulatory Visit | Attending: Cardiology

## 2024-05-25 DIAGNOSIS — Z951 Presence of aortocoronary bypass graft: Secondary | ICD-10-CM

## 2024-05-25 DIAGNOSIS — R001 Bradycardia, unspecified: Secondary | ICD-10-CM

## 2024-05-25 DIAGNOSIS — Z48812 Encounter for surgical aftercare following surgery on the circulatory system: Secondary | ICD-10-CM | POA: Diagnosis not present

## 2024-05-26 DIAGNOSIS — I495 Sick sinus syndrome: Secondary | ICD-10-CM | POA: Diagnosis not present

## 2024-05-27 ENCOUNTER — Encounter (HOSPITAL_COMMUNITY)
Admission: RE | Admit: 2024-05-27 | Discharge: 2024-05-27 | Disposition: A | Discharge status: Home / Self Care | Source: Ambulatory Visit | Attending: Cardiology | Admitting: Cardiology

## 2024-05-27 ENCOUNTER — Encounter (HOSPITAL_COMMUNITY)

## 2024-05-27 ENCOUNTER — Ambulatory Visit: Payer: Self-pay | Admitting: Physician Assistant

## 2024-05-27 DIAGNOSIS — Z48812 Encounter for surgical aftercare following surgery on the circulatory system: Secondary | ICD-10-CM | POA: Insufficient documentation

## 2024-05-27 DIAGNOSIS — R001 Bradycardia, unspecified: Secondary | ICD-10-CM

## 2024-05-27 DIAGNOSIS — Z951 Presence of aortocoronary bypass graft: Secondary | ICD-10-CM | POA: Insufficient documentation

## 2024-06-01 ENCOUNTER — Encounter (HOSPITAL_COMMUNITY)

## 2024-06-01 ENCOUNTER — Encounter (HOSPITAL_COMMUNITY)
Admission: RE | Admit: 2024-06-01 | Discharge: 2024-06-01 | Disposition: A | Discharge status: Home / Self Care | Source: Ambulatory Visit | Attending: Cardiology

## 2024-06-01 DIAGNOSIS — Z48812 Encounter for surgical aftercare following surgery on the circulatory system: Secondary | ICD-10-CM | POA: Diagnosis not present

## 2024-06-01 DIAGNOSIS — Z951 Presence of aortocoronary bypass graft: Secondary | ICD-10-CM

## 2024-06-03 ENCOUNTER — Ambulatory Visit: Payer: Self-pay

## 2024-06-03 ENCOUNTER — Telehealth: Payer: Self-pay | Admitting: Cardiology

## 2024-06-03 ENCOUNTER — Encounter (HOSPITAL_COMMUNITY)
Admission: RE | Admit: 2024-06-03 | Discharge: 2024-06-03 | Disposition: A | Discharge status: Home / Self Care | Source: Ambulatory Visit | Attending: Cardiology

## 2024-06-03 ENCOUNTER — Encounter (HOSPITAL_COMMUNITY)

## 2024-06-03 DIAGNOSIS — Z951 Presence of aortocoronary bypass graft: Secondary | ICD-10-CM

## 2024-06-03 DIAGNOSIS — Z48812 Encounter for surgical aftercare following surgery on the circulatory system: Secondary | ICD-10-CM | POA: Diagnosis not present

## 2024-06-03 LAB — GLUCOSE, CAPILLARY: Glucose-Capillary: 143 mg/dL — ABNORMAL HIGH (ref 70–99)

## 2024-06-03 MED ORDER — ATORVASTATIN CALCIUM 40 MG PO TABS: 40.0000 mg | ORAL_TABLET | Freq: Every day | ORAL | 3 refills | Status: AC

## 2024-06-03 NOTE — Telephone Encounter (Signed)
 Pt's medication was sent to pt's pharmacy as requested. Confirmation received.

## 2024-06-03 NOTE — Progress Notes (Signed)
 Patient said he did not have his feet placed correctly during warm up stretches. Patient fell slowly on bottom. I was right in front of him. Patient assisted up to chair. Denies having any symptoms or dizziness. Blood pressure 158/70. Telemetry Sinus rate 76. Oxygen saturation 97%.  CBG 143 . Will notify onsite provider Damien Braver NP. Onsite provider Damien Braver notified. Damien said that Mr Darin Mcclure is okay to exercise as long as ne feels okay. Sujay proceeded to exercise without further issues or complaints.Will continue to monitor the patient throughout  the program. Hadassah Elpidio Quan RN BSN

## 2024-06-03 NOTE — Telephone Encounter (Signed)
*  STAT* If patient is at the pharmacy, call can be transferred to refill team.   1. Which medications need to be refilled? (please list name of each medication and dose if known) atorvastatin  (LIPITOR) 40 MG tablet     4. Which pharmacy/location (including street and city if local pharmacy) is medication to be sent to?[facility] - [place], [place] - [address]    5. Do they need a 30 day or 90 day supply? 90

## 2024-06-05 ENCOUNTER — Ambulatory Visit
Attending: Thoracic Surgery (Cardiothoracic Vascular Surgery) | Admitting: Thoracic Surgery (Cardiothoracic Vascular Surgery)

## 2024-06-05 ENCOUNTER — Encounter (HOSPITAL_COMMUNITY)

## 2024-06-05 ENCOUNTER — Encounter (HOSPITAL_COMMUNITY)
Admission: RE | Admit: 2024-06-05 | Discharge: 2024-06-05 | Disposition: A | Discharge status: Home / Self Care | Source: Ambulatory Visit | Attending: Cardiology

## 2024-06-05 DIAGNOSIS — Z5189 Encounter for other specified aftercare: Secondary | ICD-10-CM

## 2024-06-05 DIAGNOSIS — Z951 Presence of aortocoronary bypass graft: Secondary | ICD-10-CM

## 2024-06-05 DIAGNOSIS — Z48812 Encounter for surgical aftercare following surgery on the circulatory system: Secondary | ICD-10-CM | POA: Diagnosis not present

## 2024-06-05 NOTE — Progress Notes (Signed)
     [address]       [place] [id number]             [phone]       83 year old male presents in follow-up to discuss the seroma at his vein harvest site.  It is not causing him any pain, but it is still apparent.  On exam he does not appear infected, and there is minimal fluctuance.  It is mostly firm.  We discussed the possibility of excising the capsule and I explained to him that given its location it would be very difficult to heal.  Since it is not causing him any symptoms I have recommended that he not undergo any resection.  He is agreeable with this plan.  He will follow-up as needed.

## 2024-06-05 NOTE — Progress Notes (Signed)
 Reviewed home exercise Rx with patient today.  Encouraged warm-up, cool-down, and stretching. Reviewed THRR of 56-111 and keeping RPE between 11-13. Encouraged to hydrate with activity.  Reviewed weather parameters for temperature and humidity for safe exercise outdoors. Reviewed S/S to terminate exercise and when to call 911 vs MD. Pt encouraged to always carry a cell phone for safety when exercising outdoors. Pt verbalized understanding of the home exercise Rx and was provided a copy.   Alm Parkins MS, ACSM-CEP, CCRP

## 2024-06-08 ENCOUNTER — Encounter (HOSPITAL_COMMUNITY)

## 2024-06-08 ENCOUNTER — Encounter (HOSPITAL_COMMUNITY)
Admission: RE | Admit: 2024-06-08 | Discharge: 2024-06-08 | Disposition: A | Discharge status: Home / Self Care | Source: Ambulatory Visit | Attending: Cardiology | Admitting: Cardiology

## 2024-06-08 DIAGNOSIS — Z48812 Encounter for surgical aftercare following surgery on the circulatory system: Secondary | ICD-10-CM | POA: Diagnosis not present

## 2024-06-08 DIAGNOSIS — Z951 Presence of aortocoronary bypass graft: Secondary | ICD-10-CM

## 2024-06-08 NOTE — Progress Notes (Signed)
 Cardiac Individual Treatment Plan  Patient Details  Name: Aspen Deterding MRN: [id number] Date of Birth: 02/28/1941 Referring Provider:   Flowsheet Row INTENSIVE CARDIAC REHAB ORIENT from 04/09/2024 in [facility]  Referring Provider Dr. 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  Patient's Home Medications on Admission:  Current Outpatient Medications:    acetaminophen  (TYLENOL ) 500 MG tablet, Take 1-2 tablets (500-1,000 mg total) by mouth every 6 (six) hours as needed., Disp: , Rfl:    apixaban  (ELIQUIS ) 5 MG TABS tablet, Take 1 tablet (5 mg total) by mouth 2 (two) times daily., Disp: 180 tablet, Rfl: 3   aspirin  EC 81 MG tablet, Take 81 mg by mouth daily., Disp: , Rfl:    atorvastatin  (LIPITOR) 40 MG tablet, Take 1 tablet (40 mg total) by mouth at bedtime., Disp: 90 tablet, Rfl: 3   bumetanide  (BUMEX ) 2 MG tablet, Take 1 tablet (2 mg total) by mouth as needed (take as needed for a 2-3 lb wt gain overnight)., Disp: 90 tablet, Rfl: 3   famotidine  (PEPCID ) 20 MG tablet, Take 1 tablet (20 mg total) by mouth daily. (Patient taking differently: Take 20 mg by mouth daily as needed for heartburn or indigestion.), Disp: 30 tablet, Rfl: 1   fluticasone  (FLONASE ) 50 MCG/ACT nasal spray, Place 1 spray into both nostrils daily as needed for allergies., Disp: , Rfl: 3   gabapentin  (NEURONTIN ) 300 MG capsule, Take 300 mg by mouth at bedtime. (Patient taking differently: Take 300 mg by mouth at bedtime. Takes as needed), Disp: , Rfl:    Inulin (FIBER CHOICE PO), Take 2 tablets by mouth daily., Disp: , Rfl:    JANUVIA 100 MG tablet, Take 100 mg by mouth daily., Disp: , Rfl:    levothyroxine  (SYNTHROID ) 75 MCG tablet, Take 75 mcg by mouth daily before  breakfast., Disp: , Rfl:    Magnesium  500 MG CAPS, Take 1,000 mg by mouth daily., Disp: , Rfl:    metFORMIN  (GLUCOPHAGE ) 1000 MG tablet, Take 500 mg by mouth 2 (two) times daily., Disp: , Rfl:    NONFORMULARY OR COMPOUNDED ITEM, Topical antifungal topical solution: terbinafine 3%, fluconazole 2%, tea tree oil 5%, Urea 10%, ibuprofen2% in DMSO suspension. Sig: Apply to the affected toenail(s) once daily.  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) 08/26/2017   GERD (gastroesophageal reflux disease)    Heart disease    Hyperglycemia    Hypertension    Mixed hyperlipidemia    Muscle cramp 08/26/2017   Neuropathy    Osteoarthritis    RBBB    RBBB (right bundle branch block)  Type 2 diabetes mellitus (HCC)     Tobacco Use: Social History   Tobacco Use  Smoking Status Never  Smokeless Tobacco Never    Labs: Review Flowsheet  More data exists      Latest Ref Rng & Units 01/16/2024 01/20/2024 01/21/2024 01/23/2024 04/15/2024  Labs for ITP Cardiac and Pulmonary Rehab  Cholestrol 0 - 200 mg/dL 882  - - - -  LDL (calc) 0 - 99 mg/dL 16  - - - -  HDL-C >59 mg/dL 35  - - - -  Trlycerides <150 mg/dL 667  - - - -  PH, Arterial 7.35 - 7.45 - 7.43  7.308  7.289  7.346  7.359  7.302  7.330  7.339  - -  PCO2 arterial 32 - 48 mmHg - 35  41.7  38.9  29.9  33.7  41.2  40.6  42.5  - -  Bicarbonate 20.0 - 28.0 mmol/L - 23.2  20.8   18.6  16.4  19.0  20.4  21.4  22.9  - -  TCO2 22 - 32 mmol/L - - 22  20  17  20  22  25  23  22  23  23  24  24  22  22    Acid-base deficit 0.0 - 2.0 mmol/L - 0.6  5.0  7.0  8.0  6.0  6.0  4.0  3.0  - -  O2 Saturation % - 98.7  96  96  97  97  95  100  100  - -    Details       Multiple values from one day are sorted in reverse-chronological order         Capillary Blood Glucose: Lab Results  Component Value Date   GLUCAP 143 (H) 06/03/2024   GLUCAP 91 05/01/2024   GLUCAP 123 (H) 05/01/2024   GLUCAP 89 04/29/2024   GLUCAP 151 (H) 04/29/2024     Exercise Target Goals: Exercise Program Goal: Individual exercise prescription set using results from initial 6 min walk test and THRR while considering  patient's activity barriers and safety.   Exercise Prescription Goal: Initial exercise prescription builds to 30-45 minutes a day of aerobic activity, 2-3 days per week.  Home exercise guidelines will be given to patient during program as part of exercise prescription that the participant will acknowledge.  Activity Barriers & Risk Stratification:  Activity Barriers & Cardiac Risk Stratification - 04/09/24 1417       Activity Barriers & Cardiac Risk Stratification   Activity Barriers Joint Problems;Deconditioning;Arthritis;Back Problems;Shortness of Breath;Left Hip Replacement;Right Hip Replacement;Left Knee Replacement;Neck/Spine Problems;Balance Concerns    Cardiac Risk Stratification High          6 Minute Walk:  6 Minute Walk     Row Name 04/09/24 1414         6 Minute Walk   Phase Initial     Distance 790 feet     Walk Time 6 minutes     # of Rest Breaks 0     MPH 1.5     METS 1.2     RPE 12     Perceived Dyspnea  1     VO2 Peak 4.31     Symptoms No     Resting HR 55 bpm     Resting BP 114/84     Resting Oxygen Saturation  98 %     Exercise Oxygen Saturation  during 6 min walk 97 %  Max Ex. HR 73 bpm     Max Ex. BP 152/64     2 Minute Post BP 140/64         Oxygen Initial Assessment:   Oxygen Re-Evaluation:   Oxygen Discharge (Final Oxygen Re-Evaluation):   Initial Exercise Prescription:  Initial Exercise Prescription - 04/09/24 1400       Date of Initial Exercise RX and Referring Provider   Date 04/09/24    Referring Provider Dr. Oneil Parchment, MD    Expected Discharge Date 07/01/24      NuStep   Level 1    SPM 65    Minutes 20    METs 1.3      Prescription Details   Frequency (times per week) 3    Duration Progress to 30 minutes of continuous aerobic without signs/symptoms of physical distress      Intensity   THRR 40-80% of Max Heartrate 56-111    Ratings of Perceived Exertion 11-13    Perceived Dyspnea 0-4      Progression   Progression Continue progressive overload as per policy without signs/symptoms or physical distress.      Resistance Training   Training Prescription Yes    Weight 2    Reps 10-15          Perform Capillary Blood Glucose checks as needed.  Exercise Prescription Changes:   Exercise Prescription Changes     Row Name 04/27/24 1400 05/14/24 0800 06/01/24 1030 06/05/24 1600       Response to Exercise   Blood Pressure (Admit) 122/68 130/70 140/60 130/60    Blood Pressure (Exercise) 150/78 158/62 -- --    Blood Pressure (Exit) 120/70 122/64 120/70 124/70    Rating of Perceived Exertion (Exercise) 10 13 12 12     Symptoms None None None None    Comments Pt's first day in the CRP2 program. Reviewed METs Reviewed METs and goals Reviewed home exercise Rx    Duration Continue with 30 min of aerobic exercise without signs/symptoms of physical distress. Continue with 30 min of aerobic exercise without signs/symptoms of physical distress. Continue with 30 min of aerobic exercise without signs/symptoms of physical distress. Continue with 30 min of aerobic exercise without signs/symptoms of physical distress.    Intensity THRR unchanged THRR unchanged THRR unchanged THRR unchanged       Progression   Progression Continue to progress workloads to maintain intensity without signs/symptoms of physical distress. Continue to progress workloads to maintain intensity without signs/symptoms of physical distress. Continue to progress workloads to maintain intensity without signs/symptoms of physical distress. Continue to progress workloads to maintain intensity without signs/symptoms of physical distress.    Average METs 2 3 3.6 4.2      Resistance Training   Training Prescription Yes No Yes Yes    Weight 2 lbs No weights on wednesdays 3 lbs 3 lbs    Reps 10-15 -- 10-15 10-15    Time 5 Minutes -- 5 Minutes 5 Minutes      Interval Training   Interval Training No No No No      NuStep   Level 1 6 6 6     SPM 85 84 100 104    Minutes 30 30 30 30     METs 2 3 3.6 4.2       Exercise Comments:   Exercise Comments     Row Name 04/27/24 1409 05/13/24 1125 06/01/24 1030 06/05/24 1543     Exercise Comments Pt's first day in  the CRP2 program. Pt exercised without complaints. Reviewed METs. Pt is progressing on his METS. Requested pt not increase workload on Nustep past where he now. Would like him to work on increasing his SPM. Reviewed METs and goals. Pt is making good progress on his MET level. Pt has increased form 2.0 to 3.6 METs since starting the program. Reviewed home exercise Rx with patient today. Pt has been doing some walking 4x/week, but only about 10 minutes. Encouraged to slowly increase time to 20-30. Pt verbalized undersntading of the home exercise Rx and was provided a copy.       Exercise Goals and Review:   Exercise Goals     Row Name 04/09/24 1419             Exercise Goals   Increase Physical Activity Yes       Intervention Provide advice, education, support and counseling about physical activity/exercise needs.;Develop an individualized exercise prescription for aerobic and resistive training based on initial evaluation findings, risk stratification,  comorbidities and participant's personal goals.       Expected Outcomes Short Term: Attend rehab on a regular basis to increase amount of physical activity.;Long Term: Add in home exercise to make exercise part of routine and to increase amount of physical activity.;Long Term: Exercising regularly at least 3-5 days a week.       Increase Strength and Stamina Yes       Intervention Provide advice, education, support and counseling about physical activity/exercise needs.;Develop an individualized exercise prescription for aerobic and resistive training based on initial evaluation findings, risk stratification, comorbidities and participant's personal goals.       Expected Outcomes Short Term: Increase workloads from initial exercise prescription for resistance, speed, and METs.;Short Term: Perform resistance training exercises routinely during rehab and add in resistance training at home;Long Term: Improve cardiorespiratory fitness, muscular endurance and strength as measured by increased METs and functional capacity ( )       Able to understand and use rate of perceived exertion (RPE) scale Yes       Intervention Provide education and explanation on how to use RPE scale       Expected Outcomes Short Term: Able to use RPE daily in rehab to express subjective intensity level;Long Term:  Able to use RPE to guide intensity level when exercising independently       Able to understand and use Dyspnea scale Yes       Intervention Provide education and explanation on how to use Dyspnea scale       Expected Outcomes Short Term: Able to use Dyspnea scale daily in rehab to express subjective sense of shortness of breath during exertion;Long Term: Able to use Dyspnea scale to guide intensity level when exercising independently       Knowledge and understanding of Target Heart Rate Range (THRR) Yes       Intervention Provide education and explanation of THRR including how the numbers were predicted and where they  are located for reference       Expected Outcomes Short Term: Able to state/look up THRR;Long Term: Able to use THRR to govern intensity when exercising independently;Short Term: Able to use daily as guideline for intensity in rehab       Understanding of Exercise Prescription Yes       Intervention Provide education, explanation, and written materials on patient's individual exercise prescription       Expected Outcomes Short Term: Able to explain program exercise prescription;Long Term: Able  to explain home exercise prescription to exercise independently          Exercise Goals Re-Evaluation :  Exercise Goals Re-Evaluation     Row Name 04/27/24 1147 06/01/24 1030           Exercise Goal Re-Evaluation   Exercise Goals Review Increase Physical Activity;Increase Strength and Stamina;Able to understand and use rate of perceived exertion (RPE) scale;Knowledge and understanding of Target Heart Rate Range (THRR);Understanding of Exercise Prescription Increase Physical Activity;Increase Strength and Stamina;Able to understand and use rate of perceived exertion (RPE) scale;Knowledge and understanding of Target Heart Rate Range (THRR);Understanding of Exercise Prescription      Comments Pt's first day in the CRP2 program. Pt understands the exercise Rx, THRR and RPE scale. Reviewed METs and goals. Pt voices progress on his goal of improving his knowlege on nutrition. He also is working on his blance. Pt is doing exercises from the [employer] book.      Expected Outcomes Will continue to monitor patient's progress and increase exercise workloads as tolerated. Will continue to monitor patient's progress and increase exercise workloads as tolerated.         Discharge Exercise Prescription (Final Exercise Prescription Changes):  Exercise Prescription Changes - 06/05/24 1600       Response to Exercise   Blood Pressure (Admit) 130/60    Blood Pressure (Exit) 124/70    Rating of Perceived  Exertion (Exercise) 12    Symptoms None    Comments Reviewed home exercise Rx    Duration Continue with 30 min of aerobic exercise without signs/symptoms of physical distress.    Intensity THRR unchanged      Progression   Progression Continue to progress workloads to maintain intensity without signs/symptoms of physical distress.    Average METs 4.2      Resistance Training   Training Prescription Yes    Weight 3 lbs    Reps 10-15    Time 5 Minutes      Interval Training   Interval Training No      NuStep   Level 6    SPM 104    Minutes 30    METs 4.2          Nutrition:  Target Goals: Understanding of nutrition guidelines, daily intake of sodium 1500mg , cholesterol 200mg , calories 30% from fat and 7% or less from saturated fats, daily to have 5 or more servings of fruits and vegetables.  Biometrics:  Pre Biometrics - 04/09/24 1420       Pre Biometrics   Height 6' (1.829 m)    Weight 97.5 kg    Waist Circumference 43 inches    Hip Circumference 43 inches    Waist to Hip Ratio 1 %    BMI (Calculated) 29.15    Triceps Skinfold 18 mm    % Body Fat 30.3 %    Grip Strength 30 kg    Flexibility 7 in    Single Leg Stand 4 seconds           Nutrition Therapy Plan and Nutrition Goals:  Nutrition Therapy & Goals - 05/18/24 0917       Nutrition Therapy   Diet Heart Healthy DIet    Drug/Food Interactions Statins/Certain Fruits      Personal Nutrition Goals   Nutrition Goal Patient to identify strategies for reducing cardiovascular risk by attending the [employer] education and nutrition series weekly.   goal in progress.   Personal Goal #2 Patient to improve  diet quality by using the plate method as a guide for meal planning to include lean protein/plant protein, fruits, vegetables, whole grains, nonfat dairy as part of a well-balanced diet.   goal in progress.   Comments Goals in progress.Varian has medical history of DM2, CAD, HTN, CKD3, Hyperlipidemia, CHF,  CABGx4. LDL is at goal. A1c is well controlled in a pre-diabetic range. He is down 2.4# since starting wiht our program. He continues to attend the [facility] education/nutrition series regularly. Patient will benefit from participation in intensive cardiac rehab for nutrition, exercise, and lifestyle modification.      Intervention Plan   Intervention Prescribe, educate and counsel regarding individualized specific dietary modifications aiming towards targeted core components such as weight, hypertension, lipid management, diabetes, heart failure and other comorbidities.;Nutrition handout(s) given to patient.    Expected Outcomes Short Term Goal: Understand basic principles of dietary content, such as calories, fat, sodium, cholesterol and nutrients.;Long Term Goal: Adherence to prescribed nutrition plan.          Nutrition Assessments:  Nutrition Assessments - 04/15/24 1052       Rate Your Plate Scores   Pre Score 58         MEDIFICTS Score Key: >=70 Need to make dietary changes  40-70 Heart Healthy Diet <= 40 Therapeutic Level Cholesterol Diet   Flowsheet Row INTENSIVE CARDIAC REHAB from 04/15/2024 in [facility] V[facility] Lung [facility]  Picture Your Plate Total Score on Admission 58   Picture Your Plate Scores: <59 Unhealthy dietary pattern with much room for improvement. 41-50 Dietary pattern unlikely to meet recommendations for good health and room for improvement. 51-60 More healthful dietary pattern, with some room for improvement.  >60 Healthy dietary pattern, although there may be some specific behaviors that could be improved.    Nutrition Goals Re-Evaluation:  Nutrition Goals Re-Evaluation     Row Name 04/15/24 1045 05/18/24 0917           Goals   Current Weight 214 lb 15.2 oz (97.5 kg) 212 lb 8.4 oz (96.4 kg)      Comment A1c 5.9, LDL 56, HDL 39, triglycerides 264 Cr 1.52, GFR 46; other most recent labs  A1c 5.9, LDL 56, HDL  39, triglycerides 264      Expected Outcome Susan has medical history of DM2, CAD, HTN, CKD3, Hyperlipidemia, CHF, CABGx4. LDL is at goal. A1c is well controlled in a pre-diabetic range. Patient will benefit from participation in intensive cardiac rehab for nutrition, exercise, and lifestyle modification. Goals in progress.Curly has medical history of DM2, CAD, HTN, CKD3, Hyperlipidemia, CHF, CABGx4. LDL is at goal. A1c is well controlled in a pre-diabetic range. He is down 2.4# since starting wiht our program. He continues to attend the [facility] education/nutrition series regularly. Patient will benefit from participation in intensive cardiac rehab for nutrition, exercise, and lifestyle modification.         Nutrition Goals Re-Evaluation:  Nutrition Goals Re-Evaluation     Row Name 04/15/24 1045 05/18/24 0917           Goals   Current Weight 214 lb 15.2 oz (97.5 kg) 212 lb 8.4 oz (96.4 kg)      Comment A1c 5.9, LDL 56, HDL 39, triglycerides 264 Cr 1.52, GFR 46; other most recent labs  A1c 5.9, LDL 56, HDL 39, triglycerides 264      Expected Outcome Muad has medical history of DM2, CAD, HTN, CKD3, Hyperlipidemia, CHF, CABGx4. LDL is  at goal. A1c is well controlled in a pre-diabetic range. Patient will benefit from participation in intensive cardiac rehab for nutrition, exercise, and lifestyle modification. Goals in progress.Caydin has medical history of DM2, CAD, HTN, CKD3, Hyperlipidemia, CHF, CABGx4. LDL is at goal. A1c is well controlled in a pre-diabetic range. He is down 2.4# since starting wiht our program. He continues to attend the [facility] education/nutrition series regularly. Patient will benefit from participation in intensive cardiac rehab for nutrition, exercise, and lifestyle modification.         Nutrition Goals Discharge (Final Nutrition Goals Re-Evaluation):  Nutrition Goals Re-Evaluation - 05/18/24 0917       Goals   Current Weight 212 lb 8.4 oz (96.4 kg)    Comment Cr 1.52,  GFR 46; other most recent labs  A1c 5.9, LDL 56, HDL 39, triglycerides 264    Expected Outcome Goals in progress.Arnav has medical history of DM2, CAD, HTN, CKD3, Hyperlipidemia, CHF, CABGx4. LDL is at goal. A1c is well controlled in a pre-diabetic range. He is down 2.4# since starting wiht our program. He continues to attend the [facility] education/nutrition series regularly. Patient will benefit from participation in intensive cardiac rehab for nutrition, exercise, and lifestyle modification.          Psychosocial: Target Goals: Acknowledge presence or absence of significant depression and/or stress, maximize coping skills, provide positive support system. Participant is able to verbalize types and ability to use techniques and skills needed for reducing stress and depression.  Initial Review & Psychosocial Screening:  Initial Psych Review & Screening - 04/09/24 1112       Initial Review   Current issues with Current Stress Concerns    Source of Stress Concerns Chronic Illness    Comments Terek has felt some stress regarding his recent open heart surgery. Earl said that he overdid it this past weekend and was concerned about gaining weight. Prynce says he has been experiening some shortness of breath since surgery.      Family Dynamics   Good Support System? Yes   Dezmen has his wife Beverley 3 children and 4 gnad children for support     Barriers   Psychosocial barriers to participate in program The patient should benefit from training in stress management and relaxation.      Screening Interventions   Interventions Encouraged to exercise;Provide feedback about the scores to participant    Expected Outcomes Long Term Goal: Stressors or current issues are controlled or eliminated.;Short Term goal: Identification and review with participant of any Quality of Life or Depression concerns found by scoring the questionnaire.;Long Term goal: The participant improves quality of Life and PHQ9 Scores as seen  by post scores and/or verbalization of changes          Quality of Life Scores:  Quality of Life - 04/09/24 1425       Quality of Life   Select Quality of Life      Quality of Life Scores   Health/Function Pre 21.03 %    Socioeconomic Pre 25.75 %    Psych/Spiritual Pre 24.86 %    Family Pre 24 %    GLOBAL Pre 23.15 %         Scores of 19 and below usually indicate a poorer quality of life in these areas.  A difference of  2-3 points is a clinically meaningful difference.  A difference of 2-3 points in the total score of the Quality of Life Index has been associated with significant  improvement in overall quality of life, self-image, physical symptoms, and general health in studies assessing change in quality of life.  PHQ-9: Review Flowsheet       04/09/2024  Depression screen PHQ 2/9  Decreased Interest 0  Down, Depressed, Hopeless 0  PHQ - 2 Score 0  Altered sleeping 0  Tired, decreased energy 1  Change in appetite 1  Feeling bad or failure about yourself  0  Trouble concentrating 0  Moving slowly or fidgety/restless 1  Suicidal thoughts 0  PHQ-9 Score 3  Difficult doing work/chores Somewhat difficult   Interpretation of Total Score  Total Score Depression Severity:  1-4 = Minimal depression, 5-9 = Mild depression, 10-14 = Moderate depression, 15-19 = Moderately severe depression, 20-27 = Severe depression   Psychosocial Evaluation and Intervention:   Psychosocial Re-Evaluation:  Psychosocial Re-Evaluation     Row Name 04/29/24 1635 05/12/24 1247 06/01/24 0743         Psychosocial Re-Evaluation   Current issues with Current Stress Concerns Current Stress Concerns Current Stress Concerns     Comments Nicholas says he is not as short of breath since his recent hospitaliztion and his betablocker has been discontinued Mutasim says he still is experiencing som shortness of of breath aty times but generally feels beeter Kuzey contiunues to feel like he is making slow  improvement.     Expected Outcomes Mihcael will have decreased or controlled stress upon completion of cardiac rehab Banner will have decreased or controlled stress upon completion of cardiac rehab Romulo will have decreased or controlled stress upon completion of cardiac rehab     Interventions Stress management education;Relaxation education;Encouraged to attend Cardiac Rehabilitation for the exercise Stress management education;Relaxation education;Encouraged to attend Cardiac Rehabilitation for the exercise Stress management education;Relaxation education;Encouraged to attend Cardiac Rehabilitation for the exercise     Continue Psychosocial Services  Follow up required by staff Follow up required by staff No Follow up required       Initial Review   Source of Stress Concerns Chronic Illness;Unable to participate in former interests or hobbies;Unable to perform yard/household activities Chronic Illness;Unable to participate in former interests or hobbies;Unable to perform yard/household activities Chronic Illness;Unable to participate in former interests or hobbies;Unable to perform yard/household activities     Comments Will continue to monitor and offer support as needed. Will continue to monitor and offer support as needed. Will continue to monitor and offer support as needed.        Psychosocial Discharge (Final Psychosocial Re-Evaluation):  Psychosocial Re-Evaluation - 06/01/24 0743       Psychosocial Re-Evaluation   Current issues with Current Stress Concerns    Comments Istvan contiunues to feel like he is making slow improvement.    Expected Outcomes Arjuna will have decreased or controlled stress upon completion of cardiac rehab    Interventions Stress management education;Relaxation education;Encouraged to attend Cardiac Rehabilitation for the exercise    Continue Psychosocial Services  No Follow up required      Initial Review   Source of Stress Concerns Chronic Illness;Unable to  participate in former interests or hobbies;Unable to perform yard/household activities    Comments Will continue to monitor and offer support as needed.          Vocational Rehabilitation: Provide vocational rehab assistance to qualifying candidates.   Vocational Rehab Evaluation & Intervention:  Vocational Rehab - 04/09/24 1217       Initial Vocational Rehab Evaluation & Intervention   Assessment shows need for Vocational  Rehabilitation No   Derwin is retired and does not need vocational rehab at this time         Education: Education Goals: Education classes will be provided on a weekly basis, covering required topics. Participant will state understanding/return demonstration of topics presented.    Education     Row Name 04/15/24 1000     Education   Cardiac Education Topics Pritikin   Secondary school teacher School   Educator Dietitian   Weekly Topic Personalizing Your Pritikin Plate   Instruction Review Code 1- Verbalizes Understanding   Class Start Time 0815   Class Stop Time 0850   Class Time Calculation (min) 35 min    Row Name 04/27/24 0800     Education   Cardiac Education Topics Pritikin   [employer] Workshops     Workshops   Educator Exercise Physiologist   Select Exercise   Exercise Workshop Exercise Basics: Building Your Action Plan   Instruction Review Code 1- Verbalizes Understanding   Class Start Time 0810   Class Stop Time 0853   Class Time Calculation (min) 43 min    Row Name 04/29/24 0800     Education   Cardiac Education Topics Pritikin   Orthoptist   Educator Dietitian   Weekly Topic Efficiency Cooking - Meals in a Snap   Instruction Review Code 1- Verbalizes Understanding   Class Start Time 0815   Class Stop Time 0850   Class Time Calculation (min) 35 min    Row Name 05/01/24 0900     Education   Select Core Videos     Core Videos   Educator Exercise Physiologist   Select Exercise  Education   Exercise Education Move It!   Instruction Review Code 1- Verbalizes Understanding   Class Start Time [phone]   Class Stop Time 0845   Class Time Calculation (min) 35 min    Row Name 05/04/24 0800     Education   Cardiac Education Topics Pritikin   Glass blower/designer Nutrition   Nutrition Workshop Targeting Your Nutrition Priorities   Instruction Review Code 1- Verbalizes Understanding   Class Start Time 0815   Class Stop Time 0855   Class Time Calculation (min) 40 min    Row Name 05/06/24 0800     Education   Cardiac Education Topics Pritikin   Secondary school teacher School   Educator Dietitian   Weekly Topic One-Pot Wonders   Instruction Review Code 1- Verbalizes Understanding   Class Start Time 0815   Class Stop Time 0850   Class Time Calculation (min) 35 min    Row Name 05/08/24 0800     Education   Cardiac Education Topics Pritikin   Select Core Videos     Core Videos   Educator Exercise Physiologist   Select General Education   General Education Hypertension and Heart Disease   Instruction Review Code 1- Verbalizes Understanding   Class Start Time [phone]   Class Stop Time 0847   Class Time Calculation (min) 35 min    Row Name 05/11/24 0800     Education   Cardiac Education Topics Pritikin   Geographical information systems officer Psychosocial   Psychosocial Workshop Focused Goals, Sustainable Changes   Instruction Review Code 1- [employer]  Class Start Time 0815   Class Stop Time 0850   Class Time Calculation (min) 35 min    Row Name 05/13/24 0900     Education   Cardiac Education Topics Pritikin   Orthoptist   Educator Dietitian   Weekly Topic Comforting Weekend Breakfasts   Instruction Review Code 1- Verbalizes Understanding   Class Start Time [phone]   Class Stop Time 0845   Class Time Calculation  (min) 35 min    Row Name 05/18/24 0800     Education   Cardiac Education Topics Pritikin   Select Core Videos     Core Videos   Educator Exercise Physiologist   Select Exercise Education   Exercise Education Biomechanial Limitations   Instruction Review Code 1- Verbalizes Understanding   Class Start Time 0815   Class Stop Time 0847   Class Time Calculation (min) 32 min    Row Name 05/20/24 1000     Education   Cardiac Education Topics Pritikin   Secondary school teacher School   Educator Dietitian   Weekly Topic Fast Evening Meals   Instruction Review Code 1- Verbalizes Understanding   Class Start Time 0815   Class Stop Time 0856   Class Time Calculation (min) 41 min    Row Name 05/22/24 0800     Education   Cardiac Education Topics Pritikin   Select Core Videos     Core Videos   Educator Dietitian   Select Nutrition   Nutrition Vitamins and Minerals   Instruction Review Code 1- Verbalizes Understanding   Class Start Time 0815   Class Stop Time 0905   Class Time Calculation (min) 50 min    Row Name 05/25/24 0800     Education   Cardiac Education Topics Pritikin   Glass blower/designer Nutrition   Nutrition Workshop Fueling a Forensic psychologist   Instruction Review Code 1- Verbalizes Understanding   Class Start Time 0815   Class Stop Time 0850   Class Time Calculation (min) 35 min    Row Name 05/27/24 0800     Education   Cardiac Education Topics Pritikin   Customer service manager   Weekly Topic International Cuisine- Spotlight on the [employer] Zones   Instruction Review Code 1- Verbalizes Understanding   Class Start Time 0815   Class Stop Time 0850   Class Time Calculation (min) 35 min    Row Name 06/01/24 0800     Education   Cardiac Education Topics Pritikin   [employer]     Workshops   Educator Exercise Physiologist   Select Psychosocial    Psychosocial Workshop Healthy Sleep for a Healthy Heart   Instruction Review Code 1- Verbalizes Understanding   Class Start Time 0815   Class Stop Time 0900   Class Time Calculation (min) 45 min    Row Name 06/03/24 0800     Education   Cardiac Education Topics Pritikin   Secondary school teacher School   Educator Dietitian   Weekly Topic Simple Sides and Sauces   Instruction Review Code 1- Verbalizes Understanding   Class Start Time 0815   Class Stop Time 0846   Class Time Calculation (min) 31 min    Row Name 06/05/24 0800     Education  Cardiac Education Topics Pritikin   Select Core Videos     Core Videos   Educator Exercise Physiologist   Select Psychosocial   Psychosocial How Our Thoughts Can Heal Our Hearts   Instruction Review Code 1- Verbalizes Understanding   Class Start Time 0815   Class Stop Time 0847   Class Time Calculation (min) 32 min    Row Name 06/08/24 0800     Education   Cardiac Education Topics Pritikin   Select Core Videos     Core Videos   Educator Exercise Physiologist   Select General Education   General Education Hypertension and Heart Disease   Instruction Review Code 1- Verbalizes Understanding   Class Start Time 0815   Class Stop Time 0851   Class Time Calculation (min) 36 min      Core Videos: Exercise    Move It!  Clinical staff conducted group or individual video education with verbal and written material and guidebook.  Patient learns the recommended Pritikin exercise program. Exercise with the goal of living a long, healthy life. Some of the health benefits of exercise include controlled diabetes, healthier blood pressure levels, improved cholesterol levels, improved heart and lung capacity, improved sleep, and better body composition. Everyone should speak with their doctor before starting or changing an exercise routine.  Biomechanical Limitations Clinical staff conducted group or individual video education with  verbal and written material and guidebook.  Patient learns how biomechanical limitations can impact exercise and how we can mitigate and possibly overcome limitations to have an impactful and balanced exercise routine.  Body Composition Clinical staff conducted group or individual video education with verbal and written material and guidebook.  Patient learns that body composition (ratio of muscle mass to fat mass) is a key component to assessing overall fitness, rather than body weight alone. Increased fat mass, especially visceral belly fat, can put us  at increased risk for metabolic syndrome, type 2 diabetes, heart disease, and even death. It is recommended to combine diet and exercise (cardiovascular and resistance training) to improve your body composition. Seek guidance from your physician and exercise physiologist before implementing an exercise routine.  Exercise Action Plan Clinical staff conducted group or individual video education with verbal and written material and guidebook.  Patient learns the recommended strategies to achieve and enjoy long-term exercise adherence, including variety, self-motivation, self-efficacy, and positive decision making. Benefits of exercise include fitness, good health, weight management, more energy, better sleep, less stress, and overall well-being.  Medical   Heart Disease Risk Reduction Clinical staff conducted group or individual video education with verbal and written material and guidebook.  Patient learns our heart is our most vital organ as it circulates oxygen, nutrients, white blood cells, and hormones throughout the entire body, and carries waste away. Data supports a plant-based eating plan like the Pritikin Program for its effectiveness in slowing progression of and reversing heart disease. The video provides a number of recommendations to address heart disease.   Metabolic Syndrome and Belly Fat  Clinical staff conducted group or individual  video education with verbal and written material and guidebook.  Patient learns what metabolic syndrome is, how it leads to heart disease, and how one can reverse it and keep it from coming back. You have metabolic syndrome if you have 3 of the following 5 criteria: abdominal obesity, high blood pressure, high triglycerides, low HDL cholesterol, and high blood sugar.  Hypertension and Heart Disease Clinical staff conducted group or individual video education with verbal  and written material and guidebook.  Patient learns that high blood pressure, or hypertension, is very common in the [place] . Hypertension is largely due to excessive salt intake, but other important risk factors include being overweight, physical inactivity, drinking too much alcohol, smoking, and not eating enough potassium from fruits and vegetables. High blood pressure is a leading risk factor for heart attack, stroke, congestive heart failure, dementia, kidney failure, and premature death. Long-term effects of excessive salt intake include stiffening of the arteries and thickening of heart muscle and organ damage. Recommendations include ways to reduce hypertension and the risk of heart disease.  Diseases of Our Time - Focusing on Diabetes Clinical staff conducted group or individual video education with verbal and written material and guidebook.  Patient learns why the best way to stop diseases of our time is prevention, through food and other lifestyle changes. Medicine (such as prescription pills and surgeries) is often only a Band-Aid on the problem, not a long-term solution. Most common diseases of our time include obesity, type 2 diabetes, hypertension, heart disease, and cancer. The Pritikin Program is recommended and has been proven to help reduce, reverse, and/or prevent the damaging effects of metabolic syndrome.  Nutrition   Overview of the Pritikin Eating Plan  Clinical staff conducted group or individual video  education with verbal and written material and guidebook.  Patient learns about the Pritikin Eating Plan for disease risk reduction. The Pritikin Eating Plan emphasizes a wide variety of unrefined, minimally-processed carbohydrates, like fruits, vegetables, whole grains, and legumes. Go, Caution, and Stop food choices are explained. Plant-based and lean animal proteins are emphasized. Rationale provided for low sodium intake for blood pressure control, low added sugars for blood sugar stabilization, and low added fats and oils for coronary artery disease risk reduction and weight management.  Calorie Density  Clinical staff conducted group or individual video education with verbal and written material and guidebook.  Patient learns about calorie density and how it impacts the Pritikin Eating Plan. Knowing the characteristics of the food you choose will help you decide whether those foods will lead to weight gain or weight loss, and whether you want to consume more or less of them. Weight loss is usually a side effect of the Pritikin Eating Plan because of its focus on low calorie-dense foods.  Label Reading  Clinical staff conducted group or individual video education with verbal and written material and guidebook.  Patient learns about the Pritikin recommended label reading guidelines and corresponding recommendations regarding calorie density, added sugars, sodium content, and whole grains.  Dining Out - Part 1  Clinical staff conducted group or individual video education with verbal and written material and guidebook.  Patient learns that restaurant meals can be sabotaging because they can be so high in calories, fat, sodium, and/or sugar. Patient learns recommended strategies on how to positively address this and avoid unhealthy pitfalls.  Facts on Fats  Clinical staff conducted group or individual video education with verbal and written material and guidebook.  Patient learns that lifestyle  modifications can be just as effective, if not more so, as many medications for lowering your risk of heart disease. A Pritikin lifestyle can help to reduce your risk of inflammation and atherosclerosis (cholesterol build-up, or plaque, in the artery walls). Lifestyle interventions such as dietary choices and physical activity address the cause of atherosclerosis. A review of the types of fats and their impact on blood cholesterol levels, along with dietary recommendations to reduce fat intake  is also included.  Nutrition Action Plan  Clinical staff conducted group or individual video education with verbal and written material and guidebook.  Patient learns how to incorporate Pritikin recommendations into their lifestyle. Recommendations include planning and keeping personal health goals in mind as an important part of their success.  Healthy Mind-Set    Healthy Minds, Bodies, Hearts  Clinical staff conducted group or individual video education with verbal and written material and guidebook.  Patient learns how to identify when they are stressed. Video will discuss the impact of that stress, as well as the many benefits of stress management. Patient will also be introduced to stress management techniques. The way we think, act, and feel has an impact on our hearts.  How Our Thoughts Can Heal Our Hearts  Clinical staff conducted group or individual video education with verbal and written material and guidebook.  Patient learns that negative thoughts can cause depression and anxiety. This can result in negative lifestyle behavior and serious health problems. Cognitive behavioral therapy is an effective method to help control our thoughts in order to change and improve our emotional outlook.  Additional Videos:  Exercise    Improving Performance  Clinical staff conducted group or individual video education with verbal and written material and guidebook.  Patient learns to use a non-linear approach  by alternating intensity levels and lengths of time spent exercising to help burn more calories and lose more body fat. Cardiovascular exercise helps improve heart health, metabolism, hormonal balance, blood sugar control, and recovery from fatigue. Resistance training improves strength, endurance, balance, coordination, reaction time, metabolism, and muscle mass. Flexibility exercise improves circulation, posture, and balance. Seek guidance from your physician and exercise physiologist before implementing an exercise routine and learn your capabilities and proper form for all exercise.  Introduction to Yoga  Clinical staff conducted group or individual video education with verbal and written material and guidebook.  Patient learns about yoga, a discipline of the coming together of mind, breath, and body. The benefits of yoga include improved flexibility, improved range of motion, better posture and core strength, increased lung function, weight loss, and positive self-image. Yoga's heart health benefits include lowered blood pressure, healthier heart rate, decreased cholesterol and triglyceride levels, improved immune function, and reduced stress. Seek guidance from your physician and exercise physiologist before implementing an exercise routine and learn your capabilities and proper form for all exercise.  Medical   Aging: Enhancing Your Quality of Life  Clinical staff conducted group or individual video education with verbal and written material and guidebook.  Patient learns key strategies and recommendations to stay in good physical health and enhance quality of life, such as prevention strategies, having an advocate, securing a Health Care Proxy and Power of Attorney, and keeping a list of medications and system for tracking them. It also discusses how to avoid risk for bone loss.  Biology of Weight Control  Clinical staff conducted group or individual video education with verbal and written  material and guidebook.  Patient learns that weight gain occurs because we consume more calories than we burn (eating more, moving less). Even if your body weight is normal, you may have higher ratios of fat compared to muscle mass. Too much body fat puts you at increased risk for cardiovascular disease, heart attack, stroke, type 2 diabetes, and obesity-related cancers. In addition to exercise, following the Pritikin Eating Plan can help reduce your risk.  Decoding Lab Results  Clinical staff conducted group or individual video education  with verbal and written material and guidebook.  Patient learns that lab test reflects one measurement whose values change over time and are influenced by many factors, including medication, stress, sleep, exercise, food, hydration, pre-existing medical conditions, and more. It is recommended to use the knowledge from this video to become more involved with your lab results and evaluate your numbers to speak with your doctor.   Diseases of Our Time - Overview  Clinical staff conducted group or individual video education with verbal and written material and guidebook.  Patient learns that according to the CDC, 50% to 70% of chronic diseases (such as obesity, type 2 diabetes, elevated lipids, hypertension, and heart disease) are avoidable through lifestyle improvements including healthier food choices, listening to satiety cues, and increased physical activity.  Sleep Disorders Clinical staff conducted group or individual video education with verbal and written material and guidebook.  Patient learns how good quality and duration of sleep are important to overall health and well-being. Patient also learns about sleep disorders and how they impact health along with recommendations to address them, including discussing with a physician.  Nutrition  Dining Out - Part 2 Clinical staff conducted group or individual video education with verbal and written material and  guidebook.  Patient learns how to plan ahead and communicate in order to maximize their dining experience in a healthy and nutritious manner. Included are recommended food choices based on the type of restaurant the patient is visiting.   Fueling a Banker conducted group or individual video education with verbal and written material and guidebook.  There is a strong connection between our food choices and our health. Diseases like obesity and type 2 diabetes are very prevalent and are in large-part due to lifestyle choices. The Pritikin Eating Plan provides plenty of food and hunger-curbing satisfaction. It is easy to follow, affordable, and helps reduce health risks.  Menu Workshop  Clinical staff conducted group or individual video education with verbal and written material and guidebook.  Patient learns that restaurant meals can sabotage health goals because they are often packed with calories, fat, sodium, and sugar. Recommendations include strategies to plan ahead and to communicate with the manager, chef, or server to help order a healthier meal.  Planning Your Eating Strategy  Clinical staff conducted group or individual video education with verbal and written material and guidebook.  Patient learns about the Pritikin Eating Plan and its benefit of reducing the risk of disease. The Pritikin Eating Plan does not focus on calories. Instead, it emphasizes high-quality, nutrient-rich foods. By knowing the characteristics of the foods, we choose, we can determine their calorie density and make informed decisions.  Targeting Your Nutrition Priorities  Clinical staff conducted group or individual video education with verbal and written material and guidebook.  Patient learns that lifestyle habits have a tremendous impact on disease risk and progression. This video provides eating and physical activity recommendations based on your personal health goals, such as reducing LDL  cholesterol, losing weight, preventing or controlling type 2 diabetes, and reducing high blood pressure.  Vitamins and Minerals  Clinical staff conducted group or individual video education with verbal and written material and guidebook.  Patient learns different ways to obtain key vitamins and minerals, including through a recommended healthy diet. It is important to discuss all supplements you take with your doctor.   Healthy Mind-Set    Smoking Cessation  Clinical staff conducted group or individual video education with verbal and written material  and guidebook.  Patient learns that cigarette smoking and tobacco addiction pose a serious health risk which affects millions of people. Stopping smoking will significantly reduce the risk of heart disease, lung disease, and many forms of cancer. Recommended strategies for quitting are covered, including working with your doctor to develop a successful plan.  Culinary   Becoming a Set designer conducted group or individual video education with verbal and written material and guidebook.  Patient learns that cooking at home can be healthy, cost-effective, quick, and puts them in control. Keys to cooking healthy recipes will include looking at your recipe, assessing your equipment needs, planning ahead, making it simple, choosing cost-effective seasonal ingredients, and limiting the use of added fats, salts, and sugars.  Cooking - Breakfast and Snacks  Clinical staff conducted group or individual video education with verbal and written material and guidebook.  Patient learns how important breakfast is to satiety and nutrition through the entire day. Recommendations include key foods to eat during breakfast to help stabilize blood sugar levels and to prevent overeating at meals later in the day. Planning ahead is also a key component.  Cooking - Educational psychologist conducted group or individual video education with verbal  and written material and guidebook.  Patient learns eating strategies to improve overall health, including an approach to cook more at home. Recommendations include thinking of animal protein as a side on your plate rather than center stage and focusing instead on lower calorie dense options like vegetables, fruits, whole grains, and plant-based proteins, such as beans. Making sauces in large quantities to freeze for later and leaving the skin on your vegetables are also recommended to maximize your experience.  Cooking - Healthy Salads and Dressing Clinical staff conducted group or individual video education with verbal and written material and guidebook.  Patient learns that vegetables, fruits, whole grains, and legumes are the foundations of the Pritikin Eating Plan. Recommendations include how to incorporate each of these in flavorful and healthy salads, and how to create homemade salad dressings. Proper handling of ingredients is also covered. Cooking - Soups and [employer] - Soups and Desserts Clinical staff conducted group or individual video education with verbal and written material and guidebook.  Patient learns that Pritikin soups and desserts make for easy, nutritious, and delicious snacks and meal components that are low in sodium, fat, sugar, and calorie density, while high in vitamins, minerals, and filling fiber. Recommendations include simple and healthy ideas for soups and desserts.   Overview     The Pritikin Solution Program Overview Clinical staff conducted group or individual video education with verbal and written material and guidebook.  Patient learns that the results of the Pritikin Program have been documented in more than 100 articles published in peer-reviewed journals, and the benefits include reducing risk factors for (and, in some cases, even reversing) high cholesterol, high blood pressure, type 2 diabetes, obesity, and more! An overview of the three key pillars  of the Pritikin Program will be covered: eating well, doing regular exercise, and having a healthy mind-set.  WORKSHOPS  Exercise: Exercise Basics: Building Your Action Plan Clinical staff led group instruction and group discussion with PowerPoint presentation and patient guidebook. To enhance the learning environment the use of posters, models and videos may be added. At the conclusion of this workshop, patients will comprehend the difference between physical activity and exercise, as well as the benefits of incorporating both, into their routine. Patients  will understand the FITT (Frequency, Intensity, Time, and Type) principle and how to use it to build an exercise action plan. In addition, safety concerns and other considerations for exercise and cardiac rehab will be addressed by the presenter. The purpose of this lesson is to promote a comprehensive and effective weekly exercise routine in order to improve patients' overall level of fitness.   Managing Heart Disease: Your Path to a Healthier Heart Clinical staff led group instruction and group discussion with PowerPoint presentation and patient guidebook. To enhance the learning environment the use of posters, models and videos may be added.At the conclusion of this workshop, patients will understand the anatomy and physiology of the heart. Additionally, they will understand how Pritikin's three pillars impact the risk factors, the progression, and the management of heart disease.  The purpose of this lesson is to provide a high-level overview of the heart, heart disease, and how the Pritikin lifestyle positively impacts risk factors.  Exercise Biomechanics Clinical staff led group instruction and group discussion with PowerPoint presentation and patient guidebook. To enhance the learning environment the use of posters, models and videos may be added. Patients will learn how the structural parts of their bodies function and how these  functions impact their daily activities, movement, and exercise. Patients will learn how to promote a neutral spine, learn how to manage pain, and identify ways to improve their physical movement in order to promote healthy living. The purpose of this lesson is to expose patients to common physical limitations that impact physical activity. Participants will learn practical ways to adapt and manage aches and pains, and to minimize their effect on regular exercise. Patients will learn how to maintain good posture while sitting, walking, and lifting.  Balance Training and Fall Prevention  Clinical staff led group instruction and group discussion with PowerPoint presentation and patient guidebook. To enhance the learning environment the use of posters, models and videos may be added. At the conclusion of this workshop, patients will understand the importance of their sensorimotor skills (vision, proprioception, and the vestibular system) in maintaining their ability to balance as they age. Patients will apply a variety of balancing exercises that are appropriate for their current level of function. Patients will understand the common causes for poor balance, possible solutions to these problems, and ways to modify their physical environment in order to minimize their fall risk. The purpose of this lesson is to teach patients about the importance of maintaining balance as they age and ways to minimize their risk of falling.  WORKSHOPS   Nutrition:  Fueling a Ship broker led group instruction and group discussion with PowerPoint presentation and patient guidebook. To enhance the learning environment the use of posters, models and videos may be added. Patients will review the foundational principles of the Pritikin Eating Plan and understand what constitutes a serving size in each of the food groups. Patients will also learn Pritikin-friendly foods that are better choices when away from  home and review make-ahead meal and snack options. Calorie density will be reviewed and applied to three nutrition priorities: weight maintenance, weight loss, and weight gain. The purpose of this lesson is to reinforce (in a group setting) the key concepts around what patients are recommended to eat and how to apply these guidelines when away from home by planning and selecting Pritikin-friendly options. Patients will understand how calorie density may be adjusted for different weight management goals.  Mindful Eating  Clinical staff led group instruction and group  discussion with PowerPoint presentation and patient guidebook. To enhance the learning environment the use of posters, models and videos may be added. Patients will briefly review the concepts of the Pritikin Eating Plan and the importance of low-calorie dense foods. The concept of mindful eating will be introduced as well as the importance of paying attention to internal hunger signals. Triggers for non-hunger eating and techniques for dealing with triggers will be explored. The purpose of this lesson is to provide patients with the opportunity to review the basic principles of the Pritikin Eating Plan, discuss the value of eating mindfully and how to measure internal cues of hunger and fullness using the Hunger Scale. Patients will also discuss reasons for non-hunger eating and learn strategies to use for controlling emotional eating.  Targeting Your Nutrition Priorities Clinical staff led group instruction and group discussion with PowerPoint presentation and patient guidebook. To enhance the learning environment the use of posters, models and videos may be added. Patients will learn how to determine their genetic susceptibility to disease by reviewing their family history. Patients will gain insight into the importance of diet as part of an overall healthy lifestyle in mitigating the impact of genetics and other environmental insults. The  purpose of this lesson is to provide patients with the opportunity to assess their personal nutrition priorities by looking at their family history, their own health history and current risk factors. Patients will also be able to discuss ways of prioritizing and modifying the Pritikin Eating Plan for their highest risk areas  Menu  Clinical staff led group instruction and group discussion with PowerPoint presentation and patient guidebook. To enhance the learning environment the use of posters, models and videos may be added. Using menus brought in from [employer], or printed from [employer], patients will apply the Pritikin dining out guidelines that were presented in the [employer] video. Patients will also be able to practice these guidelines in a variety of provided scenarios. The purpose of this lesson is to provide patients with the opportunity to practice hands-on learning of the Pritikin Dining Out guidelines with actual menus and practice scenarios.  Label Reading Clinical staff led group instruction and group discussion with PowerPoint presentation and patient guidebook. To enhance the learning environment the use of posters, models and videos may be added. Patients will review and discuss the Pritikin label reading guidelines presented in Pritikin's Label Reading Educational series video. Using fool labels brought in from local grocery stores and markets, patients will apply the label reading guidelines and determine if the packaged food meet the Pritikin guidelines. The purpose of this lesson is to provide patients with the opportunity to review, discuss, and practice hands-on learning of the Pritikin Label Reading guidelines with actual packaged food labels. Cooking School  Pritikin's [employer] are designed to teach patients ways to prepare quick, simple, and affordable recipes at home. The importance of nutrition's role in chronic disease risk  reduction is reflected in its emphasis in the overall Pritikin program. By learning how to prepare essential core Pritikin Eating Plan recipes, patients will increase control over what they eat; be able to customize the flavor of foods without the use of added salt, sugar, or fat; and improve the quality of the food they consume. By learning a set of core recipes which are easily assembled, quickly prepared, and affordable, patients are more likely to prepare more healthy foods at home. These workshops focus on convenient breakfasts, simple entres, side dishes, and  desserts which can be prepared with minimal effort and are consistent with nutrition recommendations for cardiovascular risk reduction. Cooking [employer] are taught by a Armed forces logistics/support/administrative officer (RD) who has been trained by the [employer]. The chef or RD has a clear understanding of the importance of minimizing - if not completely eliminating - added fat, sugar, and sodium in recipes. Throughout the series of Cooking School Workshop sessions, patients will learn about healthy ingredients and efficient methods of cooking to build confidence in their capability to prepare    Cooking School weekly topics:  Adding Flavor- Sodium-Free  Fast and Healthy Breakfasts  Powerhouse Plant-Based Proteins  Satisfying Salads and Dressings  Simple Sides and Sauces  International Cuisine-Spotlight on the [employer] Zones  Delicious Desserts  Savory Soups  [employer] - Meals in a Astronomer Appetizers and Snacks  Comforting Weekend Breakfasts  One-Pot Wonders   Fast Evening Meals  Landscape architect Your Pritikin Plate  WORKSHOPS   Healthy Mindset (Psychosocial):  Focused Goals, Sustainable Changes Clinical staff led group instruction and group discussion with PowerPoint presentation and patient guidebook. To enhance the learning environment the use of posters, models and videos may be added. Patients  will be able to apply effective goal setting strategies to establish at least one personal goal, and then take consistent, meaningful action toward that goal. They will learn to identify common barriers to achieving personal goals and develop strategies to overcome them. Patients will also gain an understanding of how our mind-set can impact our ability to achieve goals and the importance of cultivating a positive and growth-oriented mind-set. The purpose of this lesson is to provide patients with a deeper understanding of how to set and achieve personal goals, as well as the tools and strategies needed to overcome common obstacles which may arise along the way.  From Head to Heart: The Power of a Healthy Outlook  Clinical staff led group instruction and group discussion with PowerPoint presentation and patient guidebook. To enhance the learning environment the use of posters, models and videos may be added. Patients will be able to recognize and describe the impact of emotions and mood on physical health. They will discover the importance of self-care and explore self-care practices which may work for them. Patients will also learn how to utilize the 4 C's to cultivate a healthier outlook and better manage stress and challenges. The purpose of this lesson is to demonstrate to patients how a healthy outlook is an essential part of maintaining good health, especially as they continue their cardiac rehab journey.  Healthy Sleep for a Healthy Heart Clinical staff led group instruction and group discussion with PowerPoint presentation and patient guidebook. To enhance the learning environment the use of posters, models and videos may be added. At the conclusion of this workshop, patients will be able to demonstrate knowledge of the importance of sleep to overall health, well-being, and quality of life. They will understand the symptoms of, and treatments for, common sleep disorders. Patients will also be able to  identify daytime and nighttime behaviors which impact sleep, and they will be able to apply these tools to help manage sleep-related challenges. The purpose of this lesson is to provide patients with a general overview of sleep and outline the importance of quality sleep. Patients will learn about a few of the most common sleep disorders. Patients will also be introduced to the concept of "sleep hygiene," and discover ways to self-manage certain sleeping  problems through simple daily behavior changes. Finally, the workshop will motivate patients by clarifying the links between quality sleep and their goals of heart-healthy living.   Recognizing and Reducing Stress Clinical staff led group instruction and group discussion with PowerPoint presentation and patient guidebook. To enhance the learning environment the use of posters, models and videos may be added. At the conclusion of this workshop, patients will be able to understand the types of stress reactions, differentiate between acute and chronic stress, and recognize the impact that chronic stress has on their health. They will also be able to apply different coping mechanisms, such as reframing negative self-talk. Patients will have the opportunity to practice a variety of stress management techniques, such as deep abdominal breathing, progressive muscle relaxation, and/or guided imagery.  The purpose of this lesson is to educate patients on the role of stress in their lives and to provide healthy techniques for coping with it.  Learning Barriers/Preferences:  Learning Barriers/Preferences - 04/09/24 1429       Learning Barriers/Preferences   Learning Barriers Exercise Concerns   Unsteady balance, 4 seconds on single leg stand   Learning Preferences Verbal Instruction;Video;Skilled Demonstration;Group Instruction;Individual Instruction;Audio          Education Topics:  Knowledge Questionnaire Score:  Knowledge Questionnaire Score - 04/09/24  1430       Knowledge Questionnaire Score   Pre Score 24/24          Core Components/Risk Factors/Patient Goals at Admission:  Personal Goals and Risk Factors at Admission - 04/09/24 1430       Core Components/Risk Factors/Patient Goals on Admission    Weight Management Yes;Obesity;Weight Loss    Intervention Weight Management: Develop a combined nutrition and exercise program designed to reach desired caloric intake, while maintaining appropriate intake of nutrient and fiber, sodium and fats, and appropriate energy expenditure required for the weight goal.;Weight Management: Provide education and appropriate resources to help participant work on and attain dietary goals.;Weight Management/Obesity: Establish reasonable short term and long term weight goals.;Obesity: Provide education and appropriate resources to help participant work on and attain dietary goals.    Admit Weight 214 lb 15.2 oz (97.5 kg)    Expected Outcomes Short Term: Continue to assess and modify interventions until short term weight is achieved;Long Term: Adherence to nutrition and physical activity/exercise program aimed toward attainment of established weight goal;Weight Loss: Understanding of general recommendations for a balanced deficit meal plan, which promotes 1-2 lb weight loss per week and includes a negative energy balance of [phone] kcal/d;Understanding recommendations for meals to include 15-35% energy as protein, 25-35% energy from fat, 35-60% energy from carbohydrates, less than 200mg  of dietary cholesterol, 20-35 gm of total fiber daily;Understanding of distribution of calorie intake throughout the day with the consumption of 4-5 meals/snacks    Diabetes Yes    Intervention Provide education about signs/symptoms and action to take for hypo/hyperglycemia.;Provide education about proper nutrition, including hydration, and aerobic/resistive exercise prescription along with prescribed medications to achieve blood  glucose in normal ranges: Fasting glucose 65-99 mg/dL    Expected Outcomes Short Term: Participant verbalizes understanding of the signs/symptoms and immediate care of hyper/hypoglycemia, proper foot care and importance of medication, aerobic/resistive exercise and nutrition plan for blood glucose control.;Long Term: Attainment of HbA1C < 7%.    Hypertension Yes    Intervention Provide education on lifestyle modifcations including regular physical activity/exercise, weight management, moderate sodium restriction and increased consumption of fresh fruit, vegetables, and low fat dairy, alcohol moderation, and  smoking cessation.;Monitor prescription use compliance.    Expected Outcomes Short Term: Continued assessment and intervention until BP is < 140/31mm HG in hypertensive participants. < 130/42mm HG in hypertensive participants with diabetes, heart failure or chronic kidney disease.;Long Term: Maintenance of blood pressure at goal levels.    Lipids Yes    Intervention Provide education and support for participant on nutrition & aerobic/resistive exercise along with prescribed medications to achieve LDL 70mg , HDL >40mg .    Expected Outcomes Short Term: Participant states understanding of desired cholesterol values and is compliant with medications prescribed. Participant is following exercise prescription and nutrition guidelines.;Long Term: Cholesterol controlled with medications as prescribed, with individualized exercise RX and with personalized nutrition plan. Value goals: LDL < 70mg , HDL > 40 mg.    Stress Yes    Intervention Offer individual and/or small group education and counseling on adjustment to heart disease, stress management and health-related lifestyle change. Teach and support self-help strategies.;Refer participants experiencing significant psychosocial distress to appropriate mental health specialists for further evaluation and treatment. When possible, include family members and  significant others in education/counseling sessions.    Expected Outcomes Short Term: Participant demonstrates changes in health-related behavior, relaxation and other stress management skills, ability to obtain effective social support, and compliance with psychotropic medications if prescribed.;Long Term: Emotional wellbeing is indicated by absence of clinically significant psychosocial distress or social isolation.    Personal Goal Other Yes    Personal Goal Short:knowledge on diet, balance. Long: Under 200lbs, tone muscle    Intervention Willc ontinue to monitor pt and progress workloads as tolerated without sign or symptom    Expected Outcomes Pt will achieve his goals and gain strength          Core Components/Risk Factors/Patient Goals Review:   Goals and Risk Factor Review     Row Name 04/29/24 1640 05/12/24 1250 06/01/24 0746         Core Components/Risk Factors/Patient Goals Review   Personal Goals Review Weight Management/Obesity;Lipids;Hypertension;Diabetes;Stress;Improve shortness of breath with ADL's Weight Management/Obesity;Lipids;Hypertension;Diabetes;Stress;Improve shortness of breath with ADL's Weight Management/Obesity;Lipids;Hypertension;Diabetes;Stress;Improve shortness of breath with ADL's     Review Keyshawn started cardiac rehab on 04/27/24 Aldric is off to a fair start to exercise for his fitness level. Vital signs and CBG's have been stable. Jaizon continues to do well with exercise at cardiac rehab,  Vital signs and CBG's have been stable. Ahman has lost 1.2 kg since starting the program. Merric has increased his met levels. Ahmani continues to do well with exercise at cardiac rehab,  Vital signs and CBG's have been stable. Gehrig has lost 2.2 kg since starting the program. Develle has increased his met levels.     Expected Outcomes Panagiotis will continue to participate in cardiac rehab for exercise, nutrition and lifestyle modifications Ballard will continue to participate in cardiac rehab  for exercise, nutrition and lifestyle modifications Tyller will continue to participate in cardiac rehab for exercise, nutrition and lifestyle modifications        Core Components/Risk Factors/Patient Goals at Discharge (Final Review):   Goals and Risk Factor Review - 06/01/24 0746       Core Components/Risk Factors/Patient Goals Review   Personal Goals Review Weight Management/Obesity;Lipids;Hypertension;Diabetes;Stress;Improve shortness of breath with ADL's    Review Ilija continues to do well with exercise at cardiac rehab,  Vital signs and CBG's have been stable. Axiel has lost 2.2 kg since starting the program. Drayton has increased his met levels.    Expected Outcomes Vinay will continue  to participate in cardiac rehab for exercise, nutrition and lifestyle modifications          ITP Comments:  ITP Comments     Row Name 04/09/24 1214 04/15/24 0954 04/29/24 1630 05/12/24 1246 06/01/24 0742   ITP Comments Introduction to Prtikin Education Program/Intensive Cardiac Rehab. Initial orientation packet reviewed with the patient 30 Day ITP Review. Patient reported for first day of exercise profound bradycardia noted. Onsite provider evaluated the patient. 12 lead ECG obtained. EMS called patient was transported to the ED for further evaluation 30 Day ITP Review. Channing started cardiac rehab on 04/27/24 and is off to a good start to exercise for his fitness level. 30 Day ITP Review. Daeveon has good attendance and participation with exercise at cardiac rehab 30 Day ITP Review. Jaion continues to have  good attendance and participation with exercise at cardiac rehab     Comments: See ITP comments.

## 2024-06-10 ENCOUNTER — Encounter (HOSPITAL_COMMUNITY)

## 2024-06-10 ENCOUNTER — Encounter (HOSPITAL_COMMUNITY)
Admission: RE | Admit: 2024-06-10 | Discharge: 2024-06-10 | Disposition: A | Discharge status: Home / Self Care | Source: Ambulatory Visit | Attending: Cardiology

## 2024-06-10 DIAGNOSIS — Z48812 Encounter for surgical aftercare following surgery on the circulatory system: Secondary | ICD-10-CM | POA: Diagnosis not present

## 2024-06-10 DIAGNOSIS — Z951 Presence of aortocoronary bypass graft: Secondary | ICD-10-CM

## 2024-06-12 ENCOUNTER — Encounter (HOSPITAL_COMMUNITY)

## 2024-06-12 ENCOUNTER — Encounter (HOSPITAL_COMMUNITY)
Admission: RE | Admit: 2024-06-12 | Discharge: 2024-06-12 | Disposition: A | Discharge status: Home / Self Care | Source: Ambulatory Visit | Attending: Cardiology | Admitting: Cardiology

## 2024-06-12 DIAGNOSIS — Z951 Presence of aortocoronary bypass graft: Secondary | ICD-10-CM

## 2024-06-12 DIAGNOSIS — R001 Bradycardia, unspecified: Secondary | ICD-10-CM

## 2024-06-12 DIAGNOSIS — Z48812 Encounter for surgical aftercare following surgery on the circulatory system: Secondary | ICD-10-CM | POA: Diagnosis not present

## 2024-06-15 ENCOUNTER — Encounter (HOSPITAL_COMMUNITY)
Admission: RE | Admit: 2024-06-15 | Discharge: 2024-06-15 | Disposition: A | Discharge status: Home / Self Care | Source: Ambulatory Visit | Attending: Cardiology

## 2024-06-15 ENCOUNTER — Encounter (HOSPITAL_COMMUNITY)

## 2024-06-15 DIAGNOSIS — Z951 Presence of aortocoronary bypass graft: Secondary | ICD-10-CM

## 2024-06-15 DIAGNOSIS — R001 Bradycardia, unspecified: Secondary | ICD-10-CM

## 2024-06-15 DIAGNOSIS — Z48812 Encounter for surgical aftercare following surgery on the circulatory system: Secondary | ICD-10-CM | POA: Diagnosis not present

## 2024-06-17 ENCOUNTER — Encounter (HOSPITAL_COMMUNITY)
Admission: RE | Admit: 2024-06-17 | Discharge: 2024-06-17 | Disposition: A | Discharge status: Home / Self Care | Source: Ambulatory Visit | Attending: Cardiology | Admitting: Cardiology

## 2024-06-17 ENCOUNTER — Encounter (HOSPITAL_COMMUNITY)

## 2024-06-17 DIAGNOSIS — Z951 Presence of aortocoronary bypass graft: Secondary | ICD-10-CM

## 2024-06-17 DIAGNOSIS — Z48812 Encounter for surgical aftercare following surgery on the circulatory system: Secondary | ICD-10-CM | POA: Diagnosis not present

## 2024-06-19 ENCOUNTER — Encounter (HOSPITAL_COMMUNITY)
Admission: RE | Admit: 2024-06-19 | Discharge: 2024-06-19 | Disposition: A | Discharge status: Home / Self Care | Source: Ambulatory Visit | Attending: Cardiology | Admitting: Cardiology

## 2024-06-19 ENCOUNTER — Encounter (HOSPITAL_COMMUNITY)

## 2024-06-19 DIAGNOSIS — Z951 Presence of aortocoronary bypass graft: Secondary | ICD-10-CM

## 2024-06-19 DIAGNOSIS — Z48812 Encounter for surgical aftercare following surgery on the circulatory system: Secondary | ICD-10-CM | POA: Diagnosis not present

## 2024-06-21 NOTE — Progress Notes (Unsigned)
 Cardiology Office Note:  .   Date:  06/21/2024  ID:  Darin Mcclure, DOB 17-Aug-1941, MRN [id number] PCP: Darin Ozell BROCKS, MD  [facility] Providers Cardiologist:  Darin Parchment, MD { EP: Dr. Inocencio  History of Present Illness: Darin   Darin Mcclure is a 83 y.o. 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 which is slowly improving   Admitted 04/15/24 (EP service), with reports of slow HRs (30's) office advise he hold his metoprolol  > though later in the day at rehab HRs found in the 30's and referred to the ER Asymptomatic at rest > SOB, lightheaded with exertion SND, junctional bradycardia Home toprol  held (last dose 5/20), amiodarone  continued He had recovery of his SN function and discharged 04/16/24 with monitoring  I saw him 05/08/24 He is accompanied by his wife today He is back at cardiac rehab and making excellent progress, in fact today did the best so far Though does mention occasionally and really hit/miss with get a little winded with exertion, recovers quickly with a pause in walking and acouple deep breaths. Was hours working in the yard the other day did well, but tored the following day No near syncope or syncope, no dizzy spells No palpitations, CP or cardiac awareness He bruises easily but no bleeding or signs of bleeding His HR observations are resting HRs 50's and with exercise 80's Last day (#15) monitor  Wear summary HRs 41-94 (sinus/other category) No AF Formal/final report is pending In my review of some of his days appears to be either junctional or sinus Amiodarone  reduced to 100mg  daily w/plans to review with Dr. Inocencio >> recommended we stop amiodarone  and follow   He saw Dr. Parchment 6/2/0/25 Planned to stop OAC once his current Rx completed Amiodarone  stopped    Today's visit is scheduled as a post hospital visit ROS:   *** off  OAC and amio *** symptoms, brady or AF?  Arrhythmia/AAD hx AFib/AFlutter found PRE-op CABG?MAZE/LAA clip Feb 2025 Unfortunately inter-op TEE does not discuss LAA (clip) post to confirm occlusion Amiodarone  >> stopped June 2025  Studies Reviewed: Darin    EKG done today and reviewed by myself:  ***  01/21/24: TEE POST-OP IMPRESSIONS  Limited post CPB exam: The patient separated easily from CPB.  _ Left Ventricle: The left ventricular function is normal, unchanged from  pre-bypass images. The overall EF is 60-65%.  _ Right Ventricle: The right ventricular function appears low normal,  essentially unchanged from pre-bypass images.  _ Aortic Valve: The aortic valve appears normal, unchanged from pre-bypass  images. There is no insufficiency by color Doppler.  _ Mitral Valve: The mitral valve function appears unchanged from  pre-bypass  images. 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  _ Tricuspid Valve: The tricuspid valve function appears essentially  unchanged  from pre-bypass. 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    Risk Assessment/Calculations:    Physical Exam:   VS:  There were no vitals taken for this visit.   Wt Readings from Last 3 Encounters:  05/15/24 213 lb 9.6 oz (96.9 kg)  05/08/24 213 lb 6.4 oz (96.8 kg)  04/16/24 209 lb 7 oz (95 kg)    GEN: Well nourished, well developed in no acute distress NECK: No JVD; No carotid  bruits CARDIAC: *** RRR, no murmurs, rubs, gallops RESPIRATORY: *** CTA b/l without rales, wheezing or rhonchi  ABDOMEN: Soft, non-tender, non-distended EXTREMITIES: *** No edema; No deformity   ASSESSMENT AND PLAN: .    paroxysmal AFib CHA2DS2Vasc is 5 S/p MAZE, LAA clip >> OAC stopped Amiodarone  stopped  ***  SND *** off Amio and BB   CAD No symptoms C/w Dr. Beverlee    Dispo: will have him back ***, sooner if  needed  Signed, Darin Macario Arthur, PA-C

## 2024-06-22 ENCOUNTER — Encounter (HOSPITAL_COMMUNITY)

## 2024-06-22 ENCOUNTER — Encounter (HOSPITAL_COMMUNITY)
Admission: RE | Admit: 2024-06-22 | Discharge: 2024-06-22 | Disposition: A | Discharge status: Home / Self Care | Source: Ambulatory Visit | Attending: Cardiology | Admitting: Cardiology

## 2024-06-22 DIAGNOSIS — Z951 Presence of aortocoronary bypass graft: Secondary | ICD-10-CM

## 2024-06-22 DIAGNOSIS — Z48812 Encounter for surgical aftercare following surgery on the circulatory system: Secondary | ICD-10-CM | POA: Diagnosis not present

## 2024-06-23 ENCOUNTER — Encounter: Payer: Self-pay | Admitting: Physician Assistant

## 2024-06-23 ENCOUNTER — Ambulatory Visit: Attending: Internal Medicine | Admitting: Physician Assistant

## 2024-06-23 VITALS — BP 114/68 | HR 79 | Ht 72.0 in | Wt 212.0 lb

## 2024-06-23 DIAGNOSIS — I2510 Atherosclerotic heart disease of native coronary artery without angina pectoris: Secondary | ICD-10-CM | POA: Diagnosis not present

## 2024-06-23 DIAGNOSIS — I480 Paroxysmal atrial fibrillation: Secondary | ICD-10-CM

## 2024-06-23 DIAGNOSIS — I495 Sick sinus syndrome: Secondary | ICD-10-CM | POA: Diagnosis not present

## 2024-06-23 NOTE — Patient Instructions (Signed)
 Medication Instructions:  Your physician recommends that you continue on your current medications as directed. Please refer to the Current Medication list given to you today.  *If you need a refill on your cardiac medications before your next appointment, please call your pharmacy*  Follow-Up: At [facility], you and your health needs are our priority.  As part of our continuing mission to provide you with exceptional heart care, our providers are all part of one team.  This team includes your primary Cardiologist (physician) and Advanced Practice Providers or APPs (Physician Assistants and Nurse Practitioners) who all work together to provide you with the care you need, when you need it.  Your next appointment:   1 year(s)  Provider:   Soyla Norton, MD or Charlies Arthur, PA-C  We recommend signing up for the patient portal called MyChart.  Sign up information is provided on this After Visit Summary.  MyChart is used to connect with patients for Virtual Visits (Telemedicine).  Patients are able to view lab/test results, encounter notes, upcoming appointments, etc.  Non-urgent messages can be sent to your provider as well.   To learn more about what you can do with MyChart, go to [email or web address].

## 2024-06-24 ENCOUNTER — Encounter (HOSPITAL_COMMUNITY)
Admission: RE | Admit: 2024-06-24 | Discharge: 2024-06-24 | Disposition: A | Discharge status: Home / Self Care | Source: Ambulatory Visit | Attending: Cardiology

## 2024-06-24 ENCOUNTER — Encounter (HOSPITAL_COMMUNITY)

## 2024-06-24 DIAGNOSIS — Z951 Presence of aortocoronary bypass graft: Secondary | ICD-10-CM

## 2024-06-26 ENCOUNTER — Encounter (HOSPITAL_COMMUNITY)
Admission: RE | Admit: 2024-06-26 | Discharge: 2024-06-26 | Disposition: A | Discharge status: Home / Self Care | Source: Ambulatory Visit | Attending: Cardiology | Admitting: Cardiology

## 2024-06-26 ENCOUNTER — Encounter (HOSPITAL_COMMUNITY)

## 2024-06-26 DIAGNOSIS — Z48812 Encounter for surgical aftercare following surgery on the circulatory system: Secondary | ICD-10-CM | POA: Insufficient documentation

## 2024-06-26 DIAGNOSIS — R001 Bradycardia, unspecified: Secondary | ICD-10-CM

## 2024-06-26 DIAGNOSIS — Z951 Presence of aortocoronary bypass graft: Secondary | ICD-10-CM | POA: Diagnosis not present

## 2024-06-29 ENCOUNTER — Encounter (HOSPITAL_COMMUNITY)
Admission: RE | Admit: 2024-06-29 | Discharge: 2024-06-29 | Disposition: A | Discharge status: Home / Self Care | Source: Ambulatory Visit | Attending: Cardiology | Admitting: Cardiology

## 2024-06-29 ENCOUNTER — Encounter (HOSPITAL_COMMUNITY)

## 2024-06-29 DIAGNOSIS — Z48812 Encounter for surgical aftercare following surgery on the circulatory system: Secondary | ICD-10-CM | POA: Diagnosis not present

## 2024-06-29 DIAGNOSIS — Z951 Presence of aortocoronary bypass graft: Secondary | ICD-10-CM

## 2024-07-01 ENCOUNTER — Encounter (HOSPITAL_COMMUNITY)
Admission: RE | Admit: 2024-07-01 | Discharge: 2024-07-01 | Disposition: A | Discharge status: Home / Self Care | Source: Ambulatory Visit | Attending: Cardiology | Admitting: Cardiology

## 2024-07-01 ENCOUNTER — Encounter (HOSPITAL_COMMUNITY)

## 2024-07-01 DIAGNOSIS — Z951 Presence of aortocoronary bypass graft: Secondary | ICD-10-CM

## 2024-07-01 DIAGNOSIS — Z48812 Encounter for surgical aftercare following surgery on the circulatory system: Secondary | ICD-10-CM | POA: Diagnosis not present

## 2024-07-03 ENCOUNTER — Encounter (HOSPITAL_COMMUNITY)
Admission: RE | Admit: 2024-07-03 | Discharge: 2024-07-03 | Disposition: A | Discharge status: Home / Self Care | Source: Ambulatory Visit | Attending: Cardiology | Admitting: Cardiology

## 2024-07-03 DIAGNOSIS — Z48812 Encounter for surgical aftercare following surgery on the circulatory system: Secondary | ICD-10-CM | POA: Diagnosis not present

## 2024-07-03 DIAGNOSIS — Z951 Presence of aortocoronary bypass graft: Secondary | ICD-10-CM

## 2024-07-06 ENCOUNTER — Encounter (HOSPITAL_COMMUNITY)
Admission: RE | Admit: 2024-07-06 | Discharge: 2024-07-06 | Disposition: A | Discharge status: Home / Self Care | Source: Ambulatory Visit | Attending: Cardiology | Admitting: Cardiology

## 2024-07-06 DIAGNOSIS — Z48812 Encounter for surgical aftercare following surgery on the circulatory system: Secondary | ICD-10-CM | POA: Diagnosis not present

## 2024-07-06 DIAGNOSIS — Z951 Presence of aortocoronary bypass graft: Secondary | ICD-10-CM

## 2024-07-07 NOTE — Progress Notes (Signed)
 Cardiac Individual Treatment Plan  Patient Details  Name: Darin Mcclure MRN: [id number] Date of Birth: [birth year] Referring Provider:   Flowsheet Row INTENSIVE CARDIAC REHAB ORIENT from 04/09/2024 in [facility]  Referring Provider Dr. 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  Patient's Home Medications on Admission:  Current Outpatient Medications:    acetaminophen  (TYLENOL ) 500 MG tablet, Take 1-2 tablets (500-1,000 mg total) by mouth every 6 (six) hours as needed., Disp: , Rfl:    apixaban  (ELIQUIS ) 5 MG TABS tablet, Take 1 tablet (5 mg total) by mouth 2 (two) times daily., Disp: 180 tablet, Rfl: 3   aspirin  EC 81 MG tablet, Take 81 mg by mouth daily., Disp: , Rfl:    atorvastatin  (LIPITOR) 40 MG tablet, Take 1 tablet (40 mg total) by mouth at bedtime., Disp: 90 tablet, Rfl: 3   bumetanide  (BUMEX ) 2 MG tablet, Take 1 tablet (2 mg total) by mouth as needed (take as needed for a 2-3 lb wt gain overnight)., Disp: 90 tablet, Rfl: 3   famotidine  (PEPCID ) 20 MG tablet, Take 1 tablet (20 mg total) by mouth daily. (Patient taking differently: Take 20 mg by mouth daily as needed for heartburn or indigestion.), Disp: 30 tablet, Rfl: 1   fluticasone  (FLONASE ) 50 MCG/ACT nasal spray, Place 1 spray into both nostrils daily as needed for allergies., Disp: , Rfl: 3   gabapentin  (NEURONTIN ) 300 MG capsule, Take 300 mg by mouth at bedtime. (Patient taking differently: Take 300 mg by mouth at bedtime. Takes as needed), Disp: , Rfl:    Inulin (FIBER CHOICE PO), Take 2 tablets by mouth daily., Disp: , Rfl:    JANUVIA 100 MG tablet, Take 100 mg by mouth daily., Disp: , Rfl:    levothyroxine  (SYNTHROID ) 75 MCG tablet, Take 75 mcg by mouth daily before  breakfast., Disp: , Rfl:    Magnesium  500 MG CAPS, Take 1,000 mg by mouth daily., Disp: , Rfl:    metFORMIN  (GLUCOPHAGE ) 1000 MG tablet, Take 500 mg by mouth 2 (two) times daily., Disp: , Rfl:    NONFORMULARY OR COMPOUNDED ITEM, Topical antifungal topical solution: terbinafine 3%, fluconazole 2%, tea tree oil 5%, Urea 10%, ibuprofen2% in DMSO suspension. Sig: Apply to the affected toenail(s) once daily.  Order sent to [facility] [address] [place], [place] [address] (Patient taking differently: as needed. Topical antifungal topical solution: terbinafine 3%, fluconazole 2%, tea tree oil 5%, Urea 10%, ibuprofen2% in DMSO suspension. Sig: Apply to the affected toenail(s) once daily.  Order sent to [facility] [address] 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) 08/26/2017   GERD (gastroesophageal reflux  disease)    Heart disease    Hyperglycemia    Hypertension    Mixed hyperlipidemia    Muscle cramp 08/26/2017   Neuropathy    Osteoarthritis    RBBB    RBBB (right bundle branch block)    Type 2 diabetes mellitus (HCC)     Tobacco Use: Social History   Tobacco Use  Smoking Status Never  Smokeless Tobacco Never    Labs: Review Flowsheet  More data exists      Latest Ref Rng & Units 01/16/2024 01/20/2024 01/21/2024 01/23/2024 04/15/2024  Labs for ITP Cardiac and Pulmonary Rehab  Cholestrol 0 - 200 mg/dL 882  - - - -  LDL (calc) 0 - 99  mg/dL 16  - - - -  HDL-C >59 mg/dL 35  - - - -  Trlycerides <150 mg/dL 667  - - - -  PH, Arterial 7.35 - 7.45 - 7.43  7.308  7.289  7.346  7.359  7.302  7.330  7.339  - -  PCO2 arterial 32 - 48 mmHg - 35  41.7  38.9  29.9  33.7  41.2  40.6  42.5  - -  Bicarbonate 20.0 - 28.0 mmol/L - 23.2  20.8  18.6  16.4  19.0  20.4  21.4  22.9  - -  TCO2 22 - 32 mmol/L - - 22  20  17  20  22  25  23  22  23  23  24  24  22  22    Acid-base deficit 0.0 - 2.0 mmol/L - 0.6  5.0  7.0  8.0  6.0  6.0  4.0  3.0  - -  O2 Saturation % - 98.7  96  96  97  97  95  100  100  - -    Details       Multiple values from one day are sorted in reverse-chronological order         Capillary Blood Glucose: Lab Results  Component Value Date   GLUCAP 143 (H) 06/03/2024   GLUCAP 91 05/01/2024   GLUCAP 123 (H) 05/01/2024   GLUCAP 89 04/29/2024   GLUCAP 151 (H) 04/29/2024     Exercise Target Goals: Exercise Program Goal: Individual exercise prescription set using results from initial 6 min walk test and THRR while considering  patient's activity barriers and safety.   Exercise Prescription Goal: Initial exercise prescription builds to 30-45 minutes a day of aerobic activity, 2-3 days per week.  Home exercise guidelines will be given to patient during program as part of exercise prescription that the participant will acknowledge.  Activity Barriers & Risk Stratification:  Activity Barriers & Cardiac Risk Stratification - 04/09/24 1417       Activity Barriers & Cardiac Risk Stratification   Activity Barriers Joint Problems;Deconditioning;Arthritis;Back Problems;Shortness of Breath;Left Hip Replacement;Right Hip Replacement;Left Knee Replacement;Neck/Spine Problems;Balance Concerns    Cardiac Risk Stratification High          6 Minute Walk:  6 Minute Walk     Row Name 04/09/24 1414         6 Minute Walk   Phase Initial     Distance 790 feet     Walk Time 6 minutes     # of Rest Breaks 0     MPH 1.5      METS 1.2     RPE 12     Perceived Dyspnea  1     VO2 Peak 4.31  Symptoms No     Resting HR 55 bpm     Resting BP 114/84     Resting Oxygen Saturation  98 %     Exercise Oxygen Saturation  during 6 min walk 97 %     Max Ex. HR 73 bpm     Max Ex. BP 152/64     2 Minute Post BP 140/64        Oxygen Initial Assessment:   Oxygen Re-Evaluation:   Oxygen Discharge (Final Oxygen Re-Evaluation):   Initial Exercise Prescription:  Initial Exercise Prescription - 04/09/24 1400       Date of Initial Exercise RX and Referring Provider   Date 04/09/24    Referring Provider Dr. Oneil Parchment, MD    Expected Discharge Date 07/01/24      NuStep   Level 1    SPM 65    Minutes 20    METs 1.3      Prescription Details   Frequency (times per week) 3    Duration Progress to 30 minutes of continuous aerobic without signs/symptoms of physical distress      Intensity   THRR 40-80% of Max Heartrate 56-111    Ratings of Perceived Exertion 11-13    Perceived Dyspnea 0-4      Progression   Progression Continue progressive overload as per policy without signs/symptoms or physical distress.      Resistance Training   Training Prescription Yes    Weight 2    Reps 10-15          Perform Capillary Blood Glucose checks as needed.  Exercise Prescription Changes:   Exercise Prescription Changes     Row Name 04/27/24 1400 05/14/24 0800 06/01/24 1030 06/05/24 1600 06/12/24 1648     Response to Exercise   Blood Pressure (Admit) 122/68 130/70 140/60 130/60 106/60   Blood Pressure (Exercise) 150/78 158/62 -- -- --   Blood Pressure (Exit) 120/70 122/64 120/70 124/70 124/60   Heart Rate (Admit) -- -- -- -- 70 bpm   Heart Rate (Exercise) -- -- -- -- 103 bpm   Heart Rate (Exit) -- -- -- -- 82 bpm   Rating of Perceived Exertion (Exercise) 10 13 12 12 13    Symptoms None None None None None   Comments Pt's first day in the CRP2 program. Reviewed METs Reviewed METs and goals Reviewed  home exercise Rx Reviewed METs   Duration Continue with 30 min of aerobic exercise without signs/symptoms of physical distress. Continue with 30 min of aerobic exercise without signs/symptoms of physical distress. Continue with 30 min of aerobic exercise without signs/symptoms of physical distress. Continue with 30 min of aerobic exercise without signs/symptoms of physical distress. Continue with 30 min of aerobic exercise without signs/symptoms of physical distress.   Intensity THRR unchanged THRR unchanged THRR unchanged THRR unchanged THRR unchanged     Progression   Progression Continue to progress workloads to maintain intensity without signs/symptoms of physical distress. Continue to progress workloads to maintain intensity without signs/symptoms of physical distress. Continue to progress workloads to maintain intensity without signs/symptoms of physical distress. Continue to progress workloads to maintain intensity without signs/symptoms of physical distress. Continue to progress workloads to maintain intensity without signs/symptoms of physical distress.   Average METs 2 3 3.6 4.2 2.9     Resistance Training   Training Prescription Yes No Yes Yes Yes   Weight 2 lbs No weights on wednesdays 3 lbs 3 lbs 3 lbs   Reps 10-15 --  10-15 10-15 10-15   Time 5 Minutes -- 5 Minutes 5 Minutes 5 Minutes     Interval Training   Interval Training No No No No No     NuStep   Level 1 6 6 6 6    SPM 85 84 100 104 107   Minutes 30 30 30 30 15    METs 2 3 3.6 4.2 4.7     Arm Ergometer   Level -- -- -- -- 1   Watts -- -- -- -- 13   Minutes -- -- -- -- 15   METs -- -- -- -- 1.6     Home Exercise Plan   Plans to continue exercise at -- -- -- -- Home (comment)   Frequency -- -- -- -- Add 3 additional days to program exercise sessions.   Initial Home Exercises Provided -- -- -- -- 06/05/24    Row Name 06/29/24 1030             Response to Exercise   Blood Pressure (Admit) 140/68       Blood  Pressure (Exit) 108/80       Heart Rate (Admit) 76 bpm       Heart Rate (Exercise) 123 bpm       Heart Rate (Exit) 92 bpm       Rating of Perceived Exertion (Exercise) 12       Symptoms None       Comments Reviewed METs and goals       Duration Continue with 30 min of aerobic exercise without signs/symptoms of physical distress.       Intensity THRR unchanged         Progression   Progression Continue to progress workloads to maintain intensity without signs/symptoms of physical distress.       Average METs 3.55         Resistance Training   Training Prescription Yes       Weight 4 lbs       Reps 10-15       Time 5 Minutes         Interval Training   Interval Training No         NuStep   Level 6       SPM 106       Minutes 15       METs 5         Arm Ergometer   Level 1       Watts 16       Minutes 15       METs 2.1         Home Exercise Plan   Plans to continue exercise at Home (comment)       Frequency Add 3 additional days to program exercise sessions.       Initial Home Exercises Provided 06/05/24          Exercise Comments:   Exercise Comments     Row Name 04/27/24 1409 05/13/24 1125 06/01/24 1030 06/05/24 1543 06/12/24 1651   Exercise Comments Pt's first day in the CRP2 program. Pt exercised without complaints. Reviewed METs. Pt is progressing on his METS. Requested pt not increase workload on Nustep past where he now. Would like him to work on increasing his SPM. Reviewed METs and goals. Pt is making good progress on his MET level. Pt has increased form 2.0 to 3.6 METs since starting the program. Reviewed home exercise Rx with patient today. Pt has  been doing some walking 4x/week, but only about 10 minutes. Encouraged to slowly increase time to 20-30. Pt verbalized undersntading of the home exercise Rx and was provided a copy. Reviewed METs with patient today. Pt is making good progress on Nustep. Pt wanted to try the arm ergometer to work on his upper body  strnegth so pt is doing that 2nd station. Average METs have dropped due to htis as pt's METs on arm ergometer are much lower.      Exercise Goals and Review:   Exercise Goals     Row Name 04/09/24 1419             Exercise Goals   Increase Physical Activity Yes       Intervention Provide advice, education, support and counseling about physical activity/exercise needs.;Develop an individualized exercise prescription for aerobic and resistive training based on initial evaluation findings, risk stratification, comorbidities and participant's personal goals.       Expected Outcomes Short Term: Attend rehab on a regular basis to increase amount of physical activity.;Long Term: Add in home exercise to make exercise part of routine and to increase amount of physical activity.;Long Term: Exercising regularly at least 3-5 days a week.       Increase Strength and Stamina Yes       Intervention Provide advice, education, support and counseling about physical activity/exercise needs.;Develop an individualized exercise prescription for aerobic and resistive training based on initial evaluation findings, risk stratification, comorbidities and participant's personal goals.       Expected Outcomes Short Term: Increase workloads from initial exercise prescription for resistance, speed, and METs.;Short Term: Perform resistance training exercises routinely during rehab and add in resistance training at home;Long Term: Improve cardiorespiratory fitness, muscular endurance and strength as measured by increased METs and functional capacity ( )       Able to understand and use rate of perceived exertion (RPE) scale Yes       Intervention Provide education and explanation on how to use RPE scale       Expected Outcomes Short Term: Able to use RPE daily in rehab to express subjective intensity level;Long Term:  Able to use RPE to guide intensity level when exercising independently       Able to understand and use  Dyspnea scale Yes       Intervention Provide education and explanation on how to use Dyspnea scale       Expected Outcomes Short Term: Able to use Dyspnea scale daily in rehab to express subjective sense of shortness of breath during exertion;Long Term: Able to use Dyspnea scale to guide intensity level when exercising independently       Knowledge and understanding of Target Heart Rate Range (THRR) Yes       Intervention Provide education and explanation of THRR including how the numbers were predicted and where they are located for reference       Expected Outcomes Short Term: Able to state/look up THRR;Long Term: Able to use THRR to govern intensity when exercising independently;Short Term: Able to use daily as guideline for intensity in rehab       Understanding of Exercise Prescription Yes       Intervention Provide education, explanation, and written materials on patient's individual exercise prescription       Expected Outcomes Short Term: Able to explain program exercise prescription;Long Term: Able to explain home exercise prescription to exercise independently          Exercise Goals Re-Evaluation :  Exercise Goals Re-Evaluation     Row Name 04/27/24 1147 06/01/24 1030           Exercise Goal Re-Evaluation   Exercise Goals Review Increase Physical Activity;Increase Strength and Stamina;Able to understand and use rate of perceived exertion (RPE) scale;Knowledge and understanding of Target Heart Rate Range (THRR);Understanding of Exercise Prescription Increase Physical Activity;Increase Strength and Stamina;Able to understand and use rate of perceived exertion (RPE) scale;Knowledge and understanding of Target Heart Rate Range (THRR);Understanding of Exercise Prescription      Comments Pt's first day in the CRP2 program. Pt understands the exercise Rx, THRR and RPE scale. Reviewed METs and goals. Pt voices progress on his goal of improving his knowlege on nutrition. He also is working on  his blance. Pt is doing exercises from the [employer] book.      Expected Outcomes Will continue to monitor patient's progress and increase exercise workloads as tolerated. Will continue to monitor patient's progress and increase exercise workloads as tolerated.         Discharge Exercise Prescription (Final Exercise Prescription Changes):  Exercise Prescription Changes - 06/29/24 1030       Response to Exercise   Blood Pressure (Admit) 140/68    Blood Pressure (Exit) 108/80    Heart Rate (Admit) 76 bpm    Heart Rate (Exercise) 123 bpm    Heart Rate (Exit) 92 bpm    Rating of Perceived Exertion (Exercise) 12    Symptoms None    Comments Reviewed METs and goals    Duration Continue with 30 min of aerobic exercise without signs/symptoms of physical distress.    Intensity THRR unchanged      Progression   Progression Continue to progress workloads to maintain intensity without signs/symptoms of physical distress.    Average METs 3.55      Resistance Training   Training Prescription Yes    Weight 4 lbs    Reps 10-15    Time 5 Minutes      Interval Training   Interval Training No      NuStep   Level 6    SPM 106    Minutes 15    METs 5      Arm Ergometer   Level 1    Watts 16    Minutes 15    METs 2.1      Home Exercise Plan   Plans to continue exercise at Home (comment)    Frequency Add 3 additional days to program exercise sessions.    Initial Home Exercises Provided 06/05/24          Nutrition:  Target Goals: Understanding of nutrition guidelines, daily intake of sodium 1500mg , cholesterol 200mg , calories 30% from fat and 7% or less from saturated fats, daily to have 5 or more servings of fruits and vegetables.  Biometrics:  Pre Biometrics - 04/09/24 1420       Pre Biometrics   Height 6' (1.829 m)    Weight 97.5 kg    Waist Circumference 43 inches    Hip Circumference 43 inches    Waist to Hip Ratio 1 %    BMI (Calculated) 29.15    Triceps  Skinfold 18 mm    % Body Fat 30.3 %    Grip Strength 30 kg    Flexibility 7 in    Single Leg Stand 4 seconds           Nutrition Therapy Plan and Nutrition Goals:  Nutrition Therapy & Goals -  06/15/24 0931       Nutrition Therapy   Diet Heart Healthy DIet    Drug/Food Interactions Statins/Certain Fruits      Personal Nutrition Goals   Nutrition Goal Patient to identify strategies for reducing cardiovascular risk by attending the [employer] education and nutrition series weekly.   goal in progress.   Personal Goal #2 Patient to improve diet quality by using the plate method as a guide for meal planning to include lean protein/plant protein, fruits, vegetables, whole grains, nonfat dairy as part of a well-balanced diet.   goal in progress.   Comments Goals in progress.Darin Mcclure has medical history of DM2, CAD, HTN, CKD3, Hyperlipidemia, CHF, CABGx4. LDL is at goal. A1c is well controlled in a pre-diabetic range. He has maintained his weight since starting wiht our program. He continues to attend the [employer] education/nutrition series regularly. He has started making many dietary changes including increased dietary fiber, reading labels for sodium, and decreased simple sugars. His wife is a good support.Patient will benefit from participation in intensive cardiac rehab for nutrition, exercise, and lifestyle modification.      Intervention Plan   Intervention Prescribe, educate and counsel regarding individualized specific dietary modifications aiming towards targeted core components such as weight, hypertension, lipid management, diabetes, heart failure and other comorbidities.;Nutrition handout(s) given to patient.    Expected Outcomes Short Term Goal: Understand basic principles of dietary content, such as calories, fat, sodium, cholesterol and nutrients.;Long Term Goal: Adherence to prescribed nutrition plan.          Nutrition Assessments:  Nutrition Assessments - 04/15/24 1052        Rate Your Plate Scores   Pre Score 58         MEDIFICTS Score Key: >=70 Need to make dietary changes  40-70 Heart Healthy Diet <= 40 Therapeutic Level Cholesterol Diet   Flowsheet Row INTENSIVE CARDIAC REHAB from 04/15/2024 in [facility]  Picture Your Plate Total Score on Admission 58   Picture Your Plate Scores: <59 Unhealthy dietary pattern with much room for improvement. 41-50 Dietary pattern unlikely to meet recommendations for good health and room for improvement. 51-60 More healthful dietary pattern, with some room for improvement.  >60 Healthy dietary pattern, although there may be some specific behaviors that could be improved.    Nutrition Goals Re-Evaluation:  Nutrition Goals Re-Evaluation     Row Name 04/15/24 1045 05/18/24 0917 06/15/24 0931         Goals   Current Weight 214 lb 15.2 oz (97.5 kg) 212 lb 8.4 oz (96.4 kg) 215 lb 9.8 oz (97.8 kg)     Comment A1c 5.9, LDL 56, HDL 39, triglycerides 264 Cr 1.52, GFR 46; other most recent labs  A1c 5.9, LDL 56, HDL 39, triglycerides 264 Cr 1.52, GFR 46; other most recent labs A1c 5.9, LDL 56, HDL 39, triglycerides 264     Expected Outcome Darin Mcclure has medical history of DM2, CAD, HTN, CKD3, Hyperlipidemia, CHF, CABGx4. LDL is at goal. A1c is well controlled in a pre-diabetic range. Patient will benefit from participation in intensive cardiac rehab for nutrition, exercise, and lifestyle modification. Goals in progress.Darin Mcclure has medical history of DM2, CAD, HTN, CKD3, Hyperlipidemia, CHF, CABGx4. LDL is at goal. A1c is well controlled in a pre-diabetic range. He is down 2.4# since starting wiht our program. He continues to attend the [employer] education/nutrition series regularly. Patient will benefit from participation in intensive cardiac rehab for  nutrition, exercise, and lifestyle modification. Goals in progress.Darin Mcclure has medical history of DM2, CAD, HTN, CKD3, Hyperlipidemia,  CHF, CABGx4. LDL is at goal. A1c is well controlled in a pre-diabetic range. He has maintained his weight since starting wiht our program. He continues to attend the [employer] education/nutrition series regularly. He has started making many dietary changes including increased dietary fiber, reading labels for sodium, and decreased simple sugars. His wife is a good support.Patient will benefit from participation in intensive cardiac rehab for nutrition, exercise, and lifestyle modification.        Nutrition Goals Re-Evaluation:  Nutrition Goals Re-Evaluation     Row Name 04/15/24 1045 05/18/24 0917 06/15/24 0931         Goals   Current Weight 214 lb 15.2 oz (97.5 kg) 212 lb 8.4 oz (96.4 kg) 215 lb 9.8 oz (97.8 kg)     Comment A1c 5.9, LDL 56, HDL 39, triglycerides 264 Cr 1.52, GFR 46; other most recent labs  A1c 5.9, LDL 56, HDL 39, triglycerides 264 Cr 1.52, GFR 46; other most recent labs A1c 5.9, LDL 56, HDL 39, triglycerides 264     Expected Outcome Darin Mcclure has medical history of DM2, CAD, HTN, CKD3, Hyperlipidemia, CHF, CABGx4. LDL is at goal. A1c is well controlled in a pre-diabetic range. Patient will benefit from participation in intensive cardiac rehab for nutrition, exercise, and lifestyle modification. Goals in progress.Darin Mcclure has medical history of DM2, CAD, HTN, CKD3, Hyperlipidemia, CHF, CABGx4. LDL is at goal. A1c is well controlled in a pre-diabetic range. He is down 2.4# since starting wiht our program. He continues to attend the [employer] education/nutrition series regularly. Patient will benefit from participation in intensive cardiac rehab for nutrition, exercise, and lifestyle modification. Goals in progress.Darin Mcclure has medical history of DM2, CAD, HTN, CKD3, Hyperlipidemia, CHF, CABGx4. LDL is at goal. A1c is well controlled in a pre-diabetic range. He has maintained his weight since starting wiht our program. He continues to attend the [employer] education/nutrition series regularly. He has  started making many dietary changes including increased dietary fiber, reading labels for sodium, and decreased simple sugars. His wife is a good support.Patient will benefit from participation in intensive cardiac rehab for nutrition, exercise, and lifestyle modification.        Nutrition Goals Discharge (Final Nutrition Goals Re-Evaluation):  Nutrition Goals Re-Evaluation - 06/15/24 0931       Goals   Current Weight 215 lb 9.8 oz (97.8 kg)    Comment Cr 1.52, GFR 46; other most recent labs A1c 5.9, LDL 56, HDL 39, triglycerides 264    Expected Outcome Goals in progress.Darin Mcclure has medical history of DM2, CAD, HTN, CKD3, Hyperlipidemia, CHF, CABGx4. LDL is at goal. A1c is well controlled in a pre-diabetic range. He has maintained his weight since starting wiht our program. He continues to attend the [employer] education/nutrition series regularly. He has started making many dietary changes including increased dietary fiber, reading labels for sodium, and decreased simple sugars. His wife is a good support.Patient will benefit from participation in intensive cardiac rehab for nutrition, exercise, and lifestyle modification.          Psychosocial: Target Goals: Acknowledge presence or absence of significant depression and/or stress, maximize coping skills, provide positive support system. Participant is able to verbalize types and ability to use techniques and skills needed for reducing stress and depression.  Initial Review & Psychosocial Screening:  Initial Psych Review & Screening - 04/09/24 1112       Initial Review  Current issues with Current Stress Concerns    Source of Stress Concerns Chronic Illness    Comments Darin Mcclure has felt some stress regarding his recent open heart surgery. Darin Mcclure said that he overdid it this past weekend and was concerned about gaining weight. Darin Mcclure says he has been experiening some shortness of breath since surgery.      Family Dynamics   Good Support System? Yes    Nekhi has his wife Darin Mcclure 3 children and 4 gnad children for support     Barriers   Psychosocial barriers to participate in program The patient should benefit from training in stress management and relaxation.      Screening Interventions   Interventions Encouraged to exercise;Provide feedback about the scores to participant    Expected Outcomes Long Term Goal: Stressors or current issues are controlled or eliminated.;Short Term goal: Identification and review with participant of any Quality of Life or Depression concerns found by scoring the questionnaire.;Long Term goal: The participant improves quality of Life and PHQ9 Scores as seen by post scores and/or verbalization of changes          Quality of Life Scores:  Quality of Life - 04/09/24 1425       Quality of Life   Select Quality of Life      Quality of Life Scores   Health/Function Pre 21.03 %    Socioeconomic Pre 25.75 %    Psych/Spiritual Pre 24.86 %    Family Pre 24 %    GLOBAL Pre 23.15 %         Scores of 19 and below usually indicate a poorer quality of life in these areas.  A difference of  2-3 points is a clinically meaningful difference.  A difference of 2-3 points in the total score of the Quality of Life Index has been associated with significant improvement in overall quality of life, self-image, physical symptoms, and general health in studies assessing change in quality of life.  PHQ-9: Review Flowsheet       04/09/2024  Depression screen PHQ 2/9  Decreased Interest 0  Down, Depressed, Hopeless 0  PHQ - 2 Score 0  Altered sleeping 0  Tired, decreased energy 1  Change in appetite 1  Feeling bad or failure about yourself  0  Trouble concentrating 0  Moving slowly or fidgety/restless 1  Suicidal thoughts 0  PHQ-9 Score 3  Difficult doing work/chores Somewhat difficult   Interpretation of Total Score  Total Score Depression Severity:  1-4 = Minimal depression, 5-9 = Mild depression, 10-14 = Moderate  depression, 15-19 = Moderately severe depression, 20-27 = Severe depression   Psychosocial Evaluation and Intervention:   Psychosocial Re-Evaluation:  Psychosocial Re-Evaluation     Row Name 04/29/24 1635 05/12/24 1247 06/01/24 0743 07/07/24 1811       Psychosocial Re-Evaluation   Current issues with Current Stress Concerns Current Stress Concerns Current Stress Concerns None Identified    Comments Yago says he is not as short of breath since his recent hospitaliztion and his betablocker has been discontinued Calin says he still is experiencing som shortness of of breath aty times but generally feels beeter Loghan contiunues to feel like he is making slow improvement. Kaamil has not voiced any increased concerns or stressors during exercise at cardiac rehab.    Expected Outcomes Kendrick will have decreased or controlled stress upon completion of cardiac rehab Darin Mcclure will have decreased or controlled stress upon completion of cardiac rehab Corrin will have decreased or  controlled stress upon completion of cardiac rehab Darin Mcclure will have decreased or controlled stress upon completion of cardiac rehab    Interventions Stress management education;Relaxation education;Encouraged to attend Cardiac Rehabilitation for the exercise Stress management education;Relaxation education;Encouraged to attend Cardiac Rehabilitation for the exercise Stress management education;Relaxation education;Encouraged to attend Cardiac Rehabilitation for the exercise Stress management education;Relaxation education;Encouraged to attend Cardiac Rehabilitation for the exercise    Continue Psychosocial Services  Follow up required by staff Follow up required by staff No Follow up required No Follow up required      Initial Review   Source of Stress Concerns Chronic Illness;Unable to participate in former interests or hobbies;Unable to perform yard/household activities Chronic Illness;Unable to participate in former interests or hobbies;Unable  to perform yard/household activities Chronic Illness;Unable to participate in former interests or hobbies;Unable to perform yard/household activities Chronic Illness;Unable to participate in former interests or hobbies;Unable to perform yard/household activities    Comments Will continue to monitor and offer support as needed. Will continue to monitor and offer support as needed. Will continue to monitor and offer support as needed. Will continue to monitor and offer support as needed.       Psychosocial Discharge (Final Psychosocial Re-Evaluation):  Psychosocial Re-Evaluation - 07/07/24 1811       Psychosocial Re-Evaluation   Current issues with None Identified    Comments Darin Mcclure has not voiced any increased concerns or stressors during exercise at cardiac rehab.    Expected Outcomes Darin Mcclure will have decreased or controlled stress upon completion of cardiac rehab    Interventions Stress management education;Relaxation education;Encouraged to attend Cardiac Rehabilitation for the exercise    Continue Psychosocial Services  No Follow up required      Initial Review   Source of Stress Concerns Chronic Illness;Unable to participate in former interests or hobbies;Unable to perform yard/household activities    Comments Will continue to monitor and offer support as needed.          Vocational Rehabilitation: Provide vocational rehab assistance to qualifying candidates.   Vocational Rehab Evaluation & Intervention:  Vocational Rehab - 04/09/24 1217       Initial Vocational Rehab Evaluation & Intervention   Assessment shows need for Vocational Rehabilitation No   Forest is retired and does not need vocational rehab at this time         Education: Education Goals: Education classes will be provided on a weekly basis, covering required topics. Participant will state understanding/return demonstration of topics presented.    Education     Row Name 04/15/24 1000     Education   Cardiac  Education Topics Pritikin   Secondary school teacher School   Educator Dietitian   Weekly Topic Personalizing Your Pritikin Plate   Instruction Review Code 1- Verbalizes Understanding   Class Start Time 0815   Class Stop Time 0850   Class Time Calculation (min) 35 min    Row Name 04/27/24 0800     Education   Cardiac Education Topics Pritikin   Select Workshops     Workshops   Educator Exercise Physiologist   Select Exercise   Exercise Workshop Exercise Basics: Building Your Action Plan   Instruction Review Code 1- Verbalizes Understanding   Class Start Time 0810   Class Stop Time 0853   Class Time Calculation (min) 43 min    Row Name 04/29/24 0800     Education   Cardiac Education Topics Pritikin   [employer]  Sports administrator - Meals in a Snap   Instruction Review Code 1- Verbalizes Understanding   Class Start Time 0815   Class Stop Time 0850   Class Time Calculation (min) 35 min    Row Name 05/01/24 0900     Education   Select Core Videos     Core Videos   Educator Exercise Physiologist   Select Exercise Education   Exercise Education Move It!   Instruction Review Code 1- Verbalizes Understanding   Class Start Time [phone]   Class Stop Time 0845   Class Time Calculation (min) 35 min    Row Name 05/04/24 0800     Education   Cardiac Education Topics Pritikin   Glass blower/designer Nutrition   Nutrition Workshop Targeting Your Nutrition Priorities   Instruction Review Code 1- Verbalizes Understanding   Class Start Time 0815   Class Stop Time 0855   Class Time Calculation (min) 40 min    Row Name 05/06/24 0800     Education   Cardiac Education Topics Pritikin   Secondary school teacher School   Educator Dietitian   Weekly Topic One-Pot Wonders   Instruction Review Code 1- Verbalizes Understanding   Class Start Time  0815   Class Stop Time 0850   Class Time Calculation (min) 35 min    Row Name 05/08/24 0800     Education   Cardiac Education Topics Pritikin   Select Core Videos     Core Videos   Educator Exercise Physiologist   Select General Education   General Education Hypertension and Heart Disease   Instruction Review Code 1- Verbalizes Understanding   Class Start Time [phone]   Class Stop Time 0847   Class Time Calculation (min) 35 min    Row Name 05/11/24 0800     Education   Cardiac Education Topics Pritikin   Select Workshops     Workshops   Educator Exercise Physiologist   Select Psychosocial   Psychosocial Workshop Focused Goals, Sustainable Changes   Instruction Review Code 1- Verbalizes Understanding   Class Start Time 0815   Class Stop Time 0850   Class Time Calculation (min) 35 min    Row Name 05/13/24 0900     Education   Cardiac Education Topics Pritikin   Orthoptist   Educator Dietitian   Weekly Topic Comforting Weekend Breakfasts   Instruction Review Code 1- Verbalizes Understanding   Class Start Time [phone]   Class Stop Time 0845   Class Time Calculation (min) 35 min    Row Name 05/18/24 0800     Education   Cardiac Education Topics Pritikin   Select Core Videos     Core Videos   Educator Exercise Physiologist   Select Exercise Education   Exercise Education Biomechanial Limitations   Instruction Review Code 1- Verbalizes Understanding   Class Start Time 0815   Class Stop Time 0847   Class Time Calculation (min) 32 min    Row Name 05/20/24 1000     Education   Cardiac Education Topics Pritikin   Orthoptist   Educator Dietitian   Weekly Topic Fast Evening Meals   Instruction Review Code 1- Verbalizes Understanding   Class Start Time 0815   Class Stop Time [phone]  Class Time Calculation (min) 41 min    Row Name 05/22/24 0800     Education   Cardiac Education Topics Pritikin    Select Core Videos     Core Videos   Educator Dietitian   Select Nutrition   Nutrition Vitamins and Minerals   Instruction Review Code 1- Verbalizes Understanding   Class Start Time 0815   Class Stop Time 0905   Class Time Calculation (min) 50 min    Row Name 05/25/24 0800     Education   Cardiac Education Topics Pritikin   Glass blower/designer Nutrition   Nutrition Workshop Fueling a Forensic psychologist   Instruction Review Code 1- Verbalizes Understanding   Class Start Time 0815   Class Stop Time 0850   Class Time Calculation (min) 35 min    Row Name 05/27/24 0800     Education   Cardiac Education Topics Pritikin   Customer service manager   Weekly Topic [employer]- Spotlight on the [employer] Zones   Instruction Review Code 1- Verbalizes Understanding   Class Start Time 0815   Class Stop Time 0850   Class Time Calculation (min) 35 min    Row Name 06/01/24 0800     Education   Cardiac Education Topics Pritikin   Select Workshops     Workshops   Educator Exercise Physiologist   Select Psychosocial   Psychosocial Workshop Healthy Sleep for a Healthy Heart   Instruction Review Code 1- Verbalizes Understanding   Class Start Time 0815   Class Stop Time 0900   Class Time Calculation (min) 45 min    Row Name 06/03/24 0800     Education   Cardiac Education Topics Pritikin   Secondary school teacher School   Educator Dietitian   Weekly Topic Simple Sides and Sauces   Instruction Review Code 1- Verbalizes Understanding   Class Start Time 0815   Class Stop Time 0846   Class Time Calculation (min) 31 min    Row Name 06/05/24 0800     Education   Cardiac Education Topics Pritikin   Select Core Videos     Core Videos   Educator Exercise Physiologist   Select Psychosocial   Psychosocial How Our Thoughts Can Heal Our Hearts   Instruction Review Code 1- Verbalizes  Understanding   Class Start Time 0815   Class Stop Time 0847   Class Time Calculation (min) 32 min    Row Name 06/08/24 0800     Education   Cardiac Education Topics Pritikin   Psychologist, forensic General Education   General Education Hypertension and Heart Disease   Instruction Review Code 1- Verbalizes Understanding   Class Start Time 0815   Class Stop Time 0851   Class Time Calculation (min) 36 min    Row Name 06/10/24 0900     Education   Cardiac Education Topics Pritikin   Secondary school teacher School   Educator Dietitian   Weekly Topic Powerhouse Plant-Based Proteins   Instruction Review Code 1- Verbalizes Understanding   Class Start Time 0815   Class Stop Time 0850   Class Time Calculation (min) 35 min    Row Name 06/12/24 0900     Education   Cardiac Education  Topics Pritikin   Geographical information systems officer Exercise   Exercise Workshop Managing Heart Disease: Your Path to a Healthier Heart   Instruction Review Code 1- Verbalizes Understanding   Class Start Time [phone]   Class Stop Time 0903   Class Time Calculation (min) 47 min    Row Name 06/17/24 0800     Education   Cardiac Education Topics Pritikin   Secondary school teacher School   Educator Dietitian   Weekly Topic Tasty Appetizers and Snacks   Instruction Review Code 1- Verbalizes Understanding   Class Start Time 0815   Class Stop Time 0856   Class Time Calculation (min) 41 min    Row Name 06/22/24 0800     Education   Cardiac Education Topics Pritikin   Nurse, children's Exercise Physiologist   Select General Education   Psychosocial Healthy Minds, Bodies, Hearts   Instruction Review Code 1- Verbalizes Understanding   Class Start Time 0815   Class Stop Time 0850   Class Time Calculation (min) 35 min    Row Name 06/24/24 0800      Education   Cardiac Education Topics Pritikin   Secondary school teacher School   Educator Dietitian   Weekly Topic Adding Flavor - Sodium-Free   Instruction Review Code 1- Verbalizes Understanding   Class Start Time 0815   Class Stop Time 0850   Class Time Calculation (min) 35 min    Row Name 06/26/24 0900     Education   Cardiac Education Topics Pritikin   Select Workshops     Workshops   Educator Exercise Physiologist   Select Exercise   Exercise Workshop Location manager and Fall Prevention   Instruction Review Code 1- Verbalizes Understanding   Class Start Time [phone]   Class Stop Time 0900   Class Time Calculation (min) 44 min    Row Name 06/29/24 0800     Education   Cardiac Education Topics Pritikin   Glass blower/designer Nutrition   Nutrition Workshop Label Reading   Instruction Review Code 1- Verbalizes Understanding   Class Start Time 0815   Class Stop Time 0910   Class Time Calculation (min) 55 min    Row Name 07/01/24 0800     Education   Cardiac Education Topics Pritikin   Select Core Videos     Core Videos   Educator Exercise Physiologist   Select Nutrition   Nutrition Other  Label reading   Instruction Review Code 1- Verbalizes Understanding   Class Start Time 0815   Class Stop Time 0847   Class Time Calculation (min) 32 min    Row Name 07/03/24 0800     Education   Cardiac Education Topics Pritikin   Secondary school teacher School   Educator Dietitian   Weekly Topic Fast and Healthy Breakfasts   Instruction Review Code 1- Verbalizes Understanding   Class Start Time 0815   Class Stop Time 0847   Class Time Calculation (min) 32 min    Row Name 07/06/24 0800     Education   Cardiac Education Topics Pritikin   Hospital doctor Education   General  Education Metabolic Syndrome and Belly Fat    Instruction Review Code 1- Verbalizes Understanding   Class Start Time [phone]   Class Stop Time 0900   Class Time Calculation (min) 43 min      Core Videos: Exercise    Move It!  Clinical staff conducted group or individual video education with verbal and written material and guidebook.  Patient learns the recommended Pritikin exercise program. Exercise with the goal of living a long, healthy life. Some of the health benefits of exercise include controlled diabetes, healthier blood pressure levels, improved cholesterol levels, improved heart and lung capacity, improved sleep, and better body composition. Everyone should speak with their doctor before starting or changing an exercise routine.  Biomechanical Limitations Clinical staff conducted group or individual video education with verbal and written material and guidebook.  Patient learns how biomechanical limitations can impact exercise and how we can mitigate and possibly overcome limitations to have an impactful and balanced exercise routine.  Body Composition Clinical staff conducted group or individual video education with verbal and written material and guidebook.  Patient learns that body composition (ratio of muscle mass to fat mass) is a key component to assessing overall fitness, rather than body weight alone. Increased fat mass, especially visceral belly fat, can put us  at increased risk for metabolic syndrome, type 2 diabetes, heart disease, and even death. It is recommended to combine diet and exercise (cardiovascular and resistance training) to improve your body composition. Seek guidance from your physician and exercise physiologist before implementing an exercise routine.  Exercise Action Plan Clinical staff conducted group or individual video education with verbal and written material and guidebook.  Patient learns the recommended strategies to achieve and enjoy long-term exercise adherence, including variety, self-motivation,  self-efficacy, and positive decision making. Benefits of exercise include fitness, good health, weight management, more energy, better sleep, less stress, and overall well-being.  Medical   Heart Disease Risk Reduction Clinical staff conducted group or individual video education with verbal and written material and guidebook.  Patient learns our heart is our most vital organ as it circulates oxygen, nutrients, white blood cells, and hormones throughout the entire body, and carries waste away. Data supports a plant-based eating plan like the Pritikin Program for its effectiveness in slowing progression of and reversing heart disease. The video provides a number of recommendations to address heart disease.   Metabolic Syndrome and Belly Fat  Clinical staff conducted group or individual video education with verbal and written material and guidebook.  Patient learns what metabolic syndrome is, how it leads to heart disease, and how one can reverse it and keep it from coming back. You have metabolic syndrome if you have 3 of the following 5 criteria: abdominal obesity, high blood pressure, high triglycerides, low HDL cholesterol, and high blood sugar.  Hypertension and Heart Disease Clinical staff conducted group or individual video education with verbal and written material and guidebook.  Patient learns that high blood pressure, or hypertension, is very common in the [place] . Hypertension is largely due to excessive salt intake, but other important risk factors include being overweight, physical inactivity, drinking too much alcohol, smoking, and not eating enough potassium from fruits and vegetables. High blood pressure is a leading risk factor for heart attack, stroke, congestive heart failure, dementia, kidney failure, and premature death. Long-term effects of excessive salt intake include stiffening of the arteries and thickening of heart muscle and organ damage. Recommendations include ways to  reduce hypertension and the risk  of heart disease.  Diseases of Our Time - Focusing on Diabetes Clinical staff conducted group or individual video education with verbal and written material and guidebook.  Patient learns why the best way to stop diseases of our time is prevention, through food and other lifestyle changes. Medicine (such as prescription pills and surgeries) is often only a Band-Aid on the problem, not a long-term solution. Most common diseases of our time include obesity, type 2 diabetes, hypertension, heart disease, and cancer. The Pritikin Program is recommended and has been proven to help reduce, reverse, and/or prevent the damaging effects of metabolic syndrome.  Nutrition   Overview of the Pritikin Eating Plan  Clinical staff conducted group or individual video education with verbal and written material and guidebook.  Patient learns about the Pritikin Eating Plan for disease risk reduction. The Pritikin Eating Plan emphasizes a wide variety of unrefined, minimally-processed carbohydrates, like fruits, vegetables, whole grains, and legumes. Go, Caution, and Stop food choices are explained. Plant-based and lean animal proteins are emphasized. Rationale provided for low sodium intake for blood pressure control, low added sugars for blood sugar stabilization, and low added fats and oils for coronary artery disease risk reduction and weight management.  Calorie Density  Clinical staff conducted group or individual video education with verbal and written material and guidebook.  Patient learns about calorie density and how it impacts the Pritikin Eating Plan. Knowing the characteristics of the food you choose will help you decide whether those foods will lead to weight gain or weight loss, and whether you want to consume more or less of them. Weight loss is usually a side effect of the Pritikin Eating Plan because of its focus on low calorie-dense foods.  Label Reading  Clinical staff  conducted group or individual video education with verbal and written material and guidebook.  Patient learns about the Pritikin recommended label reading guidelines and corresponding recommendations regarding calorie density, added sugars, sodium content, and whole grains.  Dining Out - Part 1  Clinical staff conducted group or individual video education with verbal and written material and guidebook.  Patient learns that restaurant meals can be sabotaging because they can be so high in calories, fat, sodium, and/or sugar. Patient learns recommended strategies on how to positively address this and avoid unhealthy pitfalls.  Facts on Fats  Clinical staff conducted group or individual video education with verbal and written material and guidebook.  Patient learns that lifestyle modifications can be just as effective, if not more so, as many medications for lowering your risk of heart disease. A Pritikin lifestyle can help to reduce your risk of inflammation and atherosclerosis (cholesterol build-up, or plaque, in the artery walls). Lifestyle interventions such as dietary choices and physical activity address the cause of atherosclerosis. A review of the types of fats and their impact on blood cholesterol levels, along with dietary recommendations to reduce fat intake is also included.  Nutrition Action Plan  Clinical staff conducted group or individual video education with verbal and written material and guidebook.  Patient learns how to incorporate Pritikin recommendations into their lifestyle. Recommendations include planning and keeping personal health goals in mind as an important part of their success.  Healthy Mind-Set    Healthy Minds, Bodies, Hearts  Clinical staff conducted group or individual video education with verbal and written material and guidebook.  Patient learns how to identify when they are stressed. Video will discuss the impact of that stress, as well as the many benefits of  stress management. Patient will also be introduced to stress management techniques. The way we think, act, and feel has an impact on our hearts.  How Our Thoughts Can Heal Our Hearts  Clinical staff conducted group or individual video education with verbal and written material and guidebook.  Patient learns that negative thoughts can cause depression and anxiety. This can result in negative lifestyle behavior and serious health problems. Cognitive behavioral therapy is an effective method to help control our thoughts in order to change and improve our emotional outlook.  Additional Videos:  Exercise    Improving Performance  Clinical staff conducted group or individual video education with verbal and written material and guidebook.  Patient learns to use a non-linear approach by alternating intensity levels and lengths of time spent exercising to help burn more calories and lose more body fat. Cardiovascular exercise helps improve heart health, metabolism, hormonal balance, blood sugar control, and recovery from fatigue. Resistance training improves strength, endurance, balance, coordination, reaction time, metabolism, and muscle mass. Flexibility exercise improves circulation, posture, and balance. Seek guidance from your physician and exercise physiologist before implementing an exercise routine and learn your capabilities and proper form for all exercise.  Introduction to Yoga  Clinical staff conducted group or individual video education with verbal and written material and guidebook.  Patient learns about yoga, a discipline of the coming together of mind, breath, and body. The benefits of yoga include improved flexibility, improved range of motion, better posture and core strength, increased lung function, weight loss, and positive self-image. Yoga's heart health benefits include lowered blood pressure, healthier heart rate, decreased cholesterol and triglyceride levels, improved immune function,  and reduced stress. Seek guidance from your physician and exercise physiologist before implementing an exercise routine and learn your capabilities and proper form for all exercise.  Medical   Aging: Enhancing Your Quality of Life  Clinical staff conducted group or individual video education with verbal and written material and guidebook.  Patient learns key strategies and recommendations to stay in good physical health and enhance quality of life, such as prevention strategies, having an advocate, securing a Health Care Proxy and Power of Attorney, and keeping a list of medications and system for tracking them. It also discusses how to avoid risk for bone loss.  Biology of Weight Control  Clinical staff conducted group or individual video education with verbal and written material and guidebook.  Patient learns that weight gain occurs because we consume more calories than we burn (eating more, moving less). Even if your body weight is normal, you may have higher ratios of fat compared to muscle mass. Too much body fat puts you at increased risk for cardiovascular disease, heart attack, stroke, type 2 diabetes, and obesity-related cancers. In addition to exercise, following the Pritikin Eating Plan can help reduce your risk.  Decoding Lab Results  Clinical staff conducted group or individual video education with verbal and written material and guidebook.  Patient learns that lab test reflects one measurement whose values change over time and are influenced by many factors, including medication, stress, sleep, exercise, food, hydration, pre-existing medical conditions, and more. It is recommended to use the knowledge from this video to become more involved with your lab results and evaluate your numbers to speak with your doctor.   Diseases of Our Time - Overview  Clinical staff conducted group or individual video education with verbal and written material and guidebook.  Patient learns that  according to the CDC, 50% to 70% of  chronic diseases (such as obesity, type 2 diabetes, elevated lipids, hypertension, and heart disease) are avoidable through lifestyle improvements including healthier food choices, listening to satiety cues, and increased physical activity.  Sleep Disorders Clinical staff conducted group or individual video education with verbal and written material and guidebook.  Patient learns how good quality and duration of sleep are important to overall health and well-being. Patient also learns about sleep disorders and how they impact health along with recommendations to address them, including discussing with a physician.  Nutrition  Dining Out - Part 2 Clinical staff conducted group or individual video education with verbal and written material and guidebook.  Patient learns how to plan ahead and communicate in order to maximize their dining experience in a healthy and nutritious manner. Included are recommended food choices based on the type of restaurant the patient is visiting.   Fueling a Banker conducted group or individual video education with verbal and written material and guidebook.  There is a strong connection between our food choices and our health. Diseases like obesity and type 2 diabetes are very prevalent and are in large-part due to lifestyle choices. The Pritikin Eating Plan provides plenty of food and hunger-curbing satisfaction. It is easy to follow, affordable, and helps reduce health risks.  Menu Workshop  Clinical staff conducted group or individual video education with verbal and written material and guidebook.  Patient learns that restaurant meals can sabotage health goals because they are often packed with calories, fat, sodium, and sugar. Recommendations include strategies to plan ahead and to communicate with the manager, chef, or server to help order a healthier meal.  Planning Your Eating Strategy  Clinical staff  conducted group or individual video education with verbal and written material and guidebook.  Patient learns about the Pritikin Eating Plan and its benefit of reducing the risk of disease. The Pritikin Eating Plan does not focus on calories. Instead, it emphasizes high-quality, nutrient-rich foods. By knowing the characteristics of the foods, we choose, we can determine their calorie density and make informed decisions.  Targeting Your Nutrition Priorities  Clinical staff conducted group or individual video education with verbal and written material and guidebook.  Patient learns that lifestyle habits have a tremendous impact on disease risk and progression. This video provides eating and physical activity recommendations based on your personal health goals, such as reducing LDL cholesterol, losing weight, preventing or controlling type 2 diabetes, and reducing high blood pressure.  Vitamins and Minerals  Clinical staff conducted group or individual video education with verbal and written material and guidebook.  Patient learns different ways to obtain key vitamins and minerals, including through a recommended healthy diet. It is important to discuss all supplements you take with your doctor.   Healthy Mind-Set    Smoking Cessation  Clinical staff conducted group or individual video education with verbal and written material and guidebook.  Patient learns that cigarette smoking and tobacco addiction pose a serious health risk which affects millions of people. Stopping smoking will significantly reduce the risk of heart disease, lung disease, and many forms of cancer. Recommended strategies for quitting are covered, including working with your doctor to develop a successful plan.  Culinary   Becoming a Set designer conducted group or individual video education with verbal and written material and guidebook.  Patient learns that cooking at home can be healthy, cost-effective,  quick, and puts them in control. Keys to cooking healthy recipes will include  looking at your recipe, assessing your equipment needs, planning ahead, making it simple, choosing cost-effective seasonal ingredients, and limiting the use of added fats, salts, and sugars.  Cooking - Breakfast and Snacks  Clinical staff conducted group or individual video education with verbal and written material and guidebook.  Patient learns how important breakfast is to satiety and nutrition through the entire day. Recommendations include key foods to eat during breakfast to help stabilize blood sugar levels and to prevent overeating at meals later in the day. Planning ahead is also a key component.  Cooking - Educational psychologist conducted group or individual video education with verbal and written material and guidebook.  Patient learns eating strategies to improve overall health, including an approach to cook more at home. Recommendations include thinking of animal protein as a side on your plate rather than center stage and focusing instead on lower calorie dense options like vegetables, fruits, whole grains, and plant-based proteins, such as beans. Making sauces in large quantities to freeze for later and leaving the skin on your vegetables are also recommended to maximize your experience.  Cooking - Healthy Salads and Dressing Clinical staff conducted group or individual video education with verbal and written material and guidebook.  Patient learns that vegetables, fruits, whole grains, and legumes are the foundations of the Pritikin Eating Plan. Recommendations include how to incorporate each of these in flavorful and healthy salads, and how to create homemade salad dressings. Proper handling of ingredients is also covered. Cooking - Soups and [employer] - Soups and Desserts Clinical staff conducted group or individual video education with verbal and written material and guidebook.  Patient  learns that Pritikin soups and desserts make for easy, nutritious, and delicious snacks and meal components that are low in sodium, fat, sugar, and calorie density, while high in vitamins, minerals, and filling fiber. Recommendations include simple and healthy ideas for soups and desserts.   Overview     The Pritikin Solution Program Overview Clinical staff conducted group or individual video education with verbal and written material and guidebook.  Patient learns that the results of the Pritikin Program have been documented in more than 100 articles published in peer-reviewed journals, and the benefits include reducing risk factors for (and, in some cases, even reversing) high cholesterol, high blood pressure, type 2 diabetes, obesity, and more! An overview of the three key pillars of the Pritikin Program will be covered: eating well, doing regular exercise, and having a healthy mind-set.  WORKSHOPS  Exercise: Exercise Basics: Building Your Action Plan Clinical staff led group instruction and group discussion with PowerPoint presentation and patient guidebook. To enhance the learning environment the use of posters, models and videos may be added. At the conclusion of this workshop, patients will comprehend the difference between physical activity and exercise, as well as the benefits of incorporating both, into their routine. Patients will understand the FITT (Frequency, Intensity, Time, and Type) principle and how to use it to build an exercise action plan. In addition, safety concerns and other considerations for exercise and cardiac rehab will be addressed by the presenter. The purpose of this lesson is to promote a comprehensive and effective weekly exercise routine in order to improve patients' overall level of fitness.   Managing Heart Disease: Your Path to a Healthier Heart Clinical staff led group instruction and group discussion with PowerPoint presentation and patient guidebook. To  enhance the learning environment the use of posters, models and videos may be  added.At the conclusion of this workshop, patients will understand the anatomy and physiology of the heart. Additionally, they will understand how Pritikin's three pillars impact the risk factors, the progression, and the management of heart disease.  The purpose of this lesson is to provide a high-level overview of the heart, heart disease, and how the Pritikin lifestyle positively impacts risk factors.  Exercise Biomechanics Clinical staff led group instruction and group discussion with PowerPoint presentation and patient guidebook. To enhance the learning environment the use of posters, models and videos may be added. Patients will learn how the structural parts of their bodies function and how these functions impact their daily activities, movement, and exercise. Patients will learn how to promote a neutral spine, learn how to manage pain, and identify ways to improve their physical movement in order to promote healthy living. The purpose of this lesson is to expose patients to common physical limitations that impact physical activity. Participants will learn practical ways to adapt and manage aches and pains, and to minimize their effect on regular exercise. Patients will learn how to maintain good posture while sitting, walking, and lifting.  Balance Training and Fall Prevention  Clinical staff led group instruction and group discussion with PowerPoint presentation and patient guidebook. To enhance the learning environment the use of posters, models and videos may be added. At the conclusion of this workshop, patients will understand the importance of their sensorimotor skills (vision, proprioception, and the vestibular system) in maintaining their ability to balance as they age. Patients will apply a variety of balancing exercises that are appropriate for their current level of function. Patients will understand  the common causes for poor balance, possible solutions to these problems, and ways to modify their physical environment in order to minimize their fall risk. The purpose of this lesson is to teach patients about the importance of maintaining balance as they age and ways to minimize their risk of falling.  WORKSHOPS   Nutrition:  Fueling a Ship broker led group instruction and group discussion with PowerPoint presentation and patient guidebook. To enhance the learning environment the use of posters, models and videos may be added. Patients will review the foundational principles of the Pritikin Eating Plan and understand what constitutes a serving size in each of the food groups. Patients will also learn Pritikin-friendly foods that are better choices when away from home and review make-ahead meal and snack options. Calorie density will be reviewed and applied to three nutrition priorities: weight maintenance, weight loss, and weight gain. The purpose of this lesson is to reinforce (in a group setting) the key concepts around what patients are recommended to eat and how to apply these guidelines when away from home by planning and selecting Pritikin-friendly options. Patients will understand how calorie density may be adjusted for different weight management goals.  Mindful Eating  Clinical staff led group instruction and group discussion with PowerPoint presentation and patient guidebook. To enhance the learning environment the use of posters, models and videos may be added. Patients will briefly review the concepts of the Pritikin Eating Plan and the importance of low-calorie dense foods. The concept of mindful eating will be introduced as well as the importance of paying attention to internal hunger signals. Triggers for non-hunger eating and techniques for dealing with triggers will be explored. The purpose of this lesson is to provide patients with the opportunity to review the basic  principles of the Pritikin Eating Plan, discuss the value of eating mindfully and  how to measure internal cues of hunger and fullness using the Hunger Scale. Patients will also discuss reasons for non-hunger eating and learn strategies to use for controlling emotional eating.  Targeting Your Nutrition Priorities Clinical staff led group instruction and group discussion with PowerPoint presentation and patient guidebook. To enhance the learning environment the use of posters, models and videos may be added. Patients will learn how to determine their genetic susceptibility to disease by reviewing their family history. Patients will gain insight into the importance of diet as part of an overall healthy lifestyle in mitigating the impact of genetics and other environmental insults. The purpose of this lesson is to provide patients with the opportunity to assess their personal nutrition priorities by looking at their family history, their own health history and current risk factors. Patients will also be able to discuss ways of prioritizing and modifying the Pritikin Eating Plan for their highest risk areas  Menu  Clinical staff led group instruction and group discussion with PowerPoint presentation and patient guidebook. To enhance the learning environment the use of posters, models and videos may be added. Using menus brought in from [employer], or printed from [employer], patients will apply the Pritikin dining out guidelines that were presented in the [employer] video. Patients will also be able to practice these guidelines in a variety of provided scenarios. The purpose of this lesson is to provide patients with the opportunity to practice hands-on learning of the Pritikin Dining Out guidelines with actual menus and practice scenarios.  Label Reading Clinical staff led group instruction and group discussion with PowerPoint presentation and patient guidebook. To enhance the  learning environment the use of posters, models and videos may be added. Patients will review and discuss the Pritikin label reading guidelines presented in Pritikin's Label Reading Educational series video. Using fool labels brought in from local grocery stores and markets, patients will apply the label reading guidelines and determine if the packaged food meet the Pritikin guidelines. The purpose of this lesson is to provide patients with the opportunity to review, discuss, and practice hands-on learning of the Pritikin Label Reading guidelines with actual packaged food labels. Cooking School  Pritikin's [employer] are designed to teach patients ways to prepare quick, simple, and affordable recipes at home. The importance of nutrition's role in chronic disease risk reduction is reflected in its emphasis in the overall Pritikin program. By learning how to prepare essential core Pritikin Eating Plan recipes, patients will increase control over what they eat; be able to customize the flavor of foods without the use of added salt, sugar, or fat; and improve the quality of the food they consume. By learning a set of core recipes which are easily assembled, quickly prepared, and affordable, patients are more likely to prepare more healthy foods at home. These workshops focus on convenient breakfasts, simple entres, side dishes, and desserts which can be prepared with minimal effort and are consistent with nutrition recommendations for cardiovascular risk reduction. Cooking [employer] are taught by a Armed forces logistics/support/administrative officer (RD) who has been trained by the [employer]. The chef or RD has a clear understanding of the importance of minimizing - if not completely eliminating - added fat, sugar, and sodium in recipes. Throughout the series of Cooking School Workshop sessions, patients will learn about healthy ingredients and efficient methods of cooking to build confidence in their  capability to prepare    Darin Mcclure's weekly topics:  Adding  Flavor- Sodium-Free  Fast and Healthy Breakfasts  Powerhouse Plant-Based Proteins  Satisfying Salads and Dressings  Simple Sides and Sauces  International Cuisine-Spotlight on the Blue Zones  Delicious Desserts  Savory Soups  Efficiency Cooking - Meals in a Snap  Tasty Appetizers and Snacks  Comforting Weekend Breakfasts  One-Pot Wonders   Fast Evening Meals  Landscape architect Your Pritikin Plate  WORKSHOPS   Healthy Mindset (Psychosocial):  Focused Goals, Sustainable Changes Clinical staff led group instruction and group discussion with PowerPoint presentation and patient guidebook. To enhance the learning environment the use of posters, models and videos may be added. Patients will be able to apply effective goal setting strategies to establish at least one personal goal, and then take consistent, meaningful action toward that goal. They will learn to identify common barriers to achieving personal goals and develop strategies to overcome them. Patients will also gain an understanding of how our mind-set can impact our ability to achieve goals and the importance of cultivating a positive and growth-oriented mind-set. The purpose of this lesson is to provide patients with a deeper understanding of how to set and achieve personal goals, as well as the tools and strategies needed to overcome common obstacles which may arise along the way.  From Head to Heart: The Power of a Healthy Outlook  Clinical staff led group instruction and group discussion with PowerPoint presentation and patient guidebook. To enhance the learning environment the use of posters, models and videos may be added. Patients will be able to recognize and describe the impact of emotions and mood on physical health. They will discover the importance of self-care and explore self-care practices which may work for them. Patients will also learn how  to utilize the 4 C's to cultivate a healthier outlook and better manage stress and challenges. The purpose of this lesson is to demonstrate to patients how a healthy outlook is an essential part of maintaining good health, especially as they continue their cardiac rehab journey.  Healthy Sleep for a Healthy Heart Clinical staff led group instruction and group discussion with PowerPoint presentation and patient guidebook. To enhance the learning environment the use of posters, models and videos may be added. At the conclusion of this workshop, patients will be able to demonstrate knowledge of the importance of sleep to overall health, well-being, and quality of life. They will understand the symptoms of, and treatments for, common sleep disorders. Patients will also be able to identify daytime and nighttime behaviors which impact sleep, and they will be able to apply these tools to help manage sleep-related challenges. The purpose of this lesson is to provide patients with a general overview of sleep and outline the importance of quality sleep. Patients will learn about a few of the most common sleep disorders. Patients will also be introduced to the concept of "sleep hygiene," and discover ways to self-manage certain sleeping problems through simple daily behavior changes. Finally, the workshop will motivate patients by clarifying the links between quality sleep and their goals of heart-healthy living.   Recognizing and Reducing Stress Clinical staff led group instruction and group discussion with PowerPoint presentation and patient guidebook. To enhance the learning environment the use of posters, models and videos may be added. At the conclusion of this workshop, patients will be able to understand the types of stress reactions, differentiate between acute and chronic stress, and recognize the impact that chronic stress has on their health. They will also be able to apply different coping  mechanisms, such as  reframing negative self-talk. Patients will have the opportunity to practice a variety of stress management techniques, such as deep abdominal breathing, progressive muscle relaxation, and/or guided imagery.  The purpose of this lesson is to educate patients on the role of stress in their lives and to provide healthy techniques for coping with it.  Learning Barriers/Preferences:  Learning Barriers/Preferences - 04/09/24 1429       Learning Barriers/Preferences   Learning Barriers Exercise Concerns   Unsteady balance, 4 seconds on single leg stand   Learning Preferences Verbal Instruction;Video;Skilled Demonstration;Group Instruction;Individual Instruction;Audio          Education Topics:  Knowledge Questionnaire Score:  Knowledge Questionnaire Score - 04/09/24 1430       Knowledge Questionnaire Score   Pre Score 24/24          Core Components/Risk Factors/Patient Goals at Admission:  Personal Goals and Risk Factors at Admission - 04/09/24 1430       Core Components/Risk Factors/Patient Goals on Admission    Weight Management Yes;Obesity;Weight Loss    Intervention Weight Management: Develop a combined nutrition and exercise program designed to reach desired caloric intake, while maintaining appropriate intake of nutrient and fiber, sodium and fats, and appropriate energy expenditure required for the weight goal.;Weight Management: Provide education and appropriate resources to help participant work on and attain dietary goals.;Weight Management/Obesity: Establish reasonable short term and long term weight goals.;Obesity: Provide education and appropriate resources to help participant work on and attain dietary goals.    Admit Weight 214 lb 15.2 oz (97.5 kg)    Expected Outcomes Short Term: Continue to assess and modify interventions until short term weight is achieved;Long Term: Adherence to nutrition and physical activity/exercise program aimed toward attainment of established  weight goal;Weight Loss: Understanding of general recommendations for a balanced deficit meal plan, which promotes 1-2 lb weight loss per week and includes a negative energy balance of [phone] kcal/d;Understanding recommendations for meals to include 15-35% energy as protein, 25-35% energy from fat, 35-60% energy from carbohydrates, less than 200mg  of dietary cholesterol, 20-35 gm of total fiber daily;Understanding of distribution of calorie intake throughout the day with the consumption of 4-5 meals/snacks    Diabetes Yes    Intervention Provide education about signs/symptoms and action to take for hypo/hyperglycemia.;Provide education about proper nutrition, including hydration, and aerobic/resistive exercise prescription along with prescribed medications to achieve blood glucose in normal ranges: Fasting glucose 65-99 mg/dL    Expected Outcomes Short Term: Participant verbalizes understanding of the signs/symptoms and immediate care of hyper/hypoglycemia, proper foot care and importance of medication, aerobic/resistive exercise and nutrition plan for blood glucose control.;Long Term: Attainment of HbA1C < 7%.    Hypertension Yes    Intervention Provide education on lifestyle modifcations including regular physical activity/exercise, weight management, moderate sodium restriction and increased consumption of fresh fruit, vegetables, and low fat dairy, alcohol moderation, and smoking cessation.;Monitor prescription use compliance.    Expected Outcomes Short Term: Continued assessment and intervention until BP is < 140/62mm HG in hypertensive participants. < 130/52mm HG in hypertensive participants with diabetes, heart failure or chronic kidney disease.;Long Term: Maintenance of blood pressure at goal levels.    Lipids Yes    Intervention Provide education and support for participant on nutrition & aerobic/resistive exercise along with prescribed medications to achieve LDL 70mg , HDL >40mg .    Expected  Outcomes Short Term: Participant states understanding of desired cholesterol values and is compliant with medications prescribed. Participant is following exercise prescription  and nutrition guidelines.;Long Term: Cholesterol controlled with medications as prescribed, with individualized exercise RX and with personalized nutrition plan. Value goals: LDL < 70mg , HDL > 40 mg.    Stress Yes    Intervention Offer individual and/or small group education and counseling on adjustment to heart disease, stress management and health-related lifestyle change. Teach and support self-help strategies.;Refer participants experiencing significant psychosocial distress to appropriate mental health specialists for further evaluation and treatment. When possible, include family members and significant others in education/counseling sessions.    Expected Outcomes Short Term: Participant demonstrates changes in health-related behavior, relaxation and other stress management skills, ability to obtain effective social support, and compliance with psychotropic medications if prescribed.;Long Term: Emotional wellbeing is indicated by absence of clinically significant psychosocial distress or social isolation.    Personal Goal Other Yes    Personal Goal Short:knowledge on diet, balance. Long: Under 200lbs, tone muscle    Intervention Willc ontinue to monitor pt and progress workloads as tolerated without sign or symptom    Expected Outcomes Pt will achieve his goals and gain strength          Core Components/Risk Factors/Patient Goals Review:   Goals and Risk Factor Review     Row Name 04/29/24 1640 05/12/24 1250 06/01/24 0746 07/07/24 1812       Core Components/Risk Factors/Patient Goals Review   Personal Goals Review Weight Management/Obesity;Lipids;Hypertension;Diabetes;Stress;Improve shortness of breath with ADL's Weight Management/Obesity;Lipids;Hypertension;Diabetes;Stress;Improve shortness of breath with ADL's  Weight Management/Obesity;Lipids;Hypertension;Diabetes;Stress;Improve shortness of breath with ADL's Weight Management/Obesity;Lipids;Hypertension;Diabetes;Stress;Improve shortness of breath with ADL's    Review Ever started cardiac rehab on 04/27/24 Darin Mcclure is off to a fair start to exercise for his fitness level. Vital signs and CBG's have been stable. Darin Mcclure continues to do well with exercise at cardiac rehab,  Vital signs and CBG's have been stable. Darin Mcclure has lost 1.2 kg since starting the program. Darin Mcclure has increased his met levels. Darin Mcclure continues to do well with exercise at cardiac rehab,  Vital signs and CBG's have been stable. Darin Mcclure has lost 2.2 kg since starting the program. Darin Mcclure has increased his met levels. Darin Mcclure continues to do well with exercise at cardiac rehab,  Vital signs and CBG's have been stable.  Darin Mcclure continues to  increase his met levels.    Expected Outcomes Darin Mcclure will continue to participate in cardiac rehab for exercise, nutrition and lifestyle modifications Darin Mcclure will continue to participate in cardiac rehab for exercise, nutrition and lifestyle modifications Darin Mcclure will continue to participate in cardiac rehab for exercise, nutrition and lifestyle modifications Darin Mcclure will continue to participate in cardiac rehab for exercise, nutrition and lifestyle modifications       Core Components/Risk Factors/Patient Goals at Discharge (Final Review):   Goals and Risk Factor Review - 07/07/24 1812       Core Components/Risk Factors/Patient Goals Review   Personal Goals Review Weight Management/Obesity;Lipids;Hypertension;Diabetes;Stress;Improve shortness of breath with ADL's    Review Ayyub continues to do well with exercise at cardiac rehab,  Vital signs and CBG's have been stable.  Samuell continues to  increase his met levels.    Expected Outcomes Rolando will continue to participate in cardiac rehab for exercise, nutrition and lifestyle modifications          ITP Comments:  ITP Comments     Row  Name 04/09/24 1214 04/15/24 0954 04/29/24 1630 05/12/24 1246 06/01/24 0742   ITP Comments Introduction to [facility] Education Program/Intensive Cardiac Rehab. Initial orientation packet reviewed with the patient 30 Day ITP Review.  Patient reported for first day of exercise profound bradycardia noted. Onsite provider evaluated the patient. 12 lead ECG obtained. EMS called patient was transported to the ED for further evaluation 30 Day ITP Review. Dick started cardiac rehab on 04/27/24 and is off to a good start to exercise for his fitness level. 30 Day ITP Review. Asif has good attendance and participation with exercise at cardiac rehab 30 Day ITP Review. Proctor continues to have  good attendance and participation with exercise at cardiac rehab    Row Name 07/07/24 1811           ITP Comments 30 Day ITP Review. Elsie continues to have  good attendance and participation with exercise at cardiac rehab. Evon will complete cardiac rehab at the end of the month.          Comments: See ITP comments.Hadassah Elpidio Quan RN BSN

## 2024-07-08 ENCOUNTER — Encounter (HOSPITAL_COMMUNITY)
Admission: RE | Admit: 2024-07-08 | Discharge: 2024-07-08 | Disposition: A | Discharge status: Home / Self Care | Source: Ambulatory Visit | Attending: Cardiology | Admitting: Cardiology

## 2024-07-08 DIAGNOSIS — Z48812 Encounter for surgical aftercare following surgery on the circulatory system: Secondary | ICD-10-CM | POA: Diagnosis not present

## 2024-07-08 DIAGNOSIS — Z951 Presence of aortocoronary bypass graft: Secondary | ICD-10-CM

## 2024-07-10 ENCOUNTER — Encounter (HOSPITAL_COMMUNITY)
Admission: RE | Admit: 2024-07-10 | Discharge: 2024-07-10 | Disposition: A | Discharge status: Home / Self Care | Source: Ambulatory Visit | Attending: Cardiology | Admitting: Cardiology

## 2024-07-10 DIAGNOSIS — Z951 Presence of aortocoronary bypass graft: Secondary | ICD-10-CM

## 2024-07-10 DIAGNOSIS — Z48812 Encounter for surgical aftercare following surgery on the circulatory system: Secondary | ICD-10-CM | POA: Diagnosis not present

## 2024-07-13 ENCOUNTER — Encounter (HOSPITAL_COMMUNITY)
Admission: RE | Admit: 2024-07-13 | Discharge: 2024-07-13 | Disposition: A | Discharge status: Home / Self Care | Source: Ambulatory Visit | Attending: Cardiology | Admitting: Cardiology

## 2024-07-13 DIAGNOSIS — Z48812 Encounter for surgical aftercare following surgery on the circulatory system: Secondary | ICD-10-CM | POA: Diagnosis not present

## 2024-07-13 DIAGNOSIS — Z951 Presence of aortocoronary bypass graft: Secondary | ICD-10-CM

## 2024-07-15 ENCOUNTER — Encounter (HOSPITAL_COMMUNITY)
Admission: RE | Admit: 2024-07-15 | Discharge: 2024-07-15 | Disposition: A | Discharge status: Home / Self Care | Source: Ambulatory Visit | Attending: Cardiology

## 2024-07-15 DIAGNOSIS — Z48812 Encounter for surgical aftercare following surgery on the circulatory system: Secondary | ICD-10-CM | POA: Diagnosis not present

## 2024-07-15 DIAGNOSIS — Z951 Presence of aortocoronary bypass graft: Secondary | ICD-10-CM

## 2024-07-17 ENCOUNTER — Encounter (HOSPITAL_COMMUNITY)
Admission: RE | Admit: 2024-07-17 | Discharge: 2024-07-17 | Disposition: A | Discharge status: Home / Self Care | Source: Ambulatory Visit | Attending: Cardiology | Admitting: Cardiology

## 2024-07-17 VITALS — Ht 72.0 in | Wt 216.1 lb

## 2024-07-17 DIAGNOSIS — Z951 Presence of aortocoronary bypass graft: Secondary | ICD-10-CM

## 2024-07-17 DIAGNOSIS — Z48812 Encounter for surgical aftercare following surgery on the circulatory system: Secondary | ICD-10-CM | POA: Diagnosis not present

## 2024-07-20 ENCOUNTER — Encounter (HOSPITAL_COMMUNITY)
Admission: RE | Admit: 2024-07-20 | Discharge: 2024-07-20 | Disposition: A | Discharge status: Home / Self Care | Source: Ambulatory Visit | Attending: Cardiology

## 2024-07-20 DIAGNOSIS — Z951 Presence of aortocoronary bypass graft: Secondary | ICD-10-CM

## 2024-07-20 DIAGNOSIS — Z48812 Encounter for surgical aftercare following surgery on the circulatory system: Secondary | ICD-10-CM | POA: Diagnosis not present

## 2024-07-22 ENCOUNTER — Encounter (HOSPITAL_COMMUNITY)
Admission: RE | Admit: 2024-07-22 | Discharge: 2024-07-22 | Disposition: A | Discharge status: Home / Self Care | Source: Ambulatory Visit | Attending: Cardiology

## 2024-07-22 DIAGNOSIS — Z48812 Encounter for surgical aftercare following surgery on the circulatory system: Secondary | ICD-10-CM | POA: Diagnosis not present

## 2024-07-22 DIAGNOSIS — Z951 Presence of aortocoronary bypass graft: Secondary | ICD-10-CM

## 2024-07-22 NOTE — Progress Notes (Signed)
 Discharge Progress Report  Patient Details  Name: Darin Mcclure MRN: [id number] Date of Birth: 04-07-1941 Referring Provider:   Flowsheet Row INTENSIVE CARDIAC REHAB ORIENT from 04/09/2024 in [facility]  Referring Provider Dr. Oneil Parchment, MD     Number of Visits: 44  Reason for Discharge:  Patient reached a stable level of exercise. Patient independent in their exercise. Patient has met program and personal goals.  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 Oneil Parchment, MD    Expected Discharge Date 07/01/24      NuStep   Level 1    SPM 65    Minutes 20    METs 1.3      Prescription Details   Frequency (times per week) 3    Duration Progress to 30 minutes of continuous aerobic without signs/symptoms of physical distress      Intensity   THRR 40-80% of Max Heartrate 56-111    Ratings of Perceived Exertion 11-13    Perceived Dyspnea 0-4      Progression   Progression Continue progressive overload as per policy without signs/symptoms or physical distress.      Resistance Training   Training Prescription Yes    Weight 2    Reps 10-15          Discharge Exercise Prescription (Final Exercise Prescription Changes):  Exercise Prescription Changes - 07/22/24 1500       Response to Exercise   Blood Pressure (Admit) 130/70    Blood Pressure (Exit) 120/74    Heart Rate (Admit) 81 bpm    Heart Rate (Exercise) 118 bpm    Heart Rate (Exit) 90 bpm    Rating of Perceived Exertion (Exercise) 12.5    Symptoms None    Comments Pt's last day in the CRP2 program    Duration Continue with 30 min of aerobic exercise without  signs/symptoms of physical distress.    Intensity THRR unchanged      Progression   Progression Continue to progress workloads to maintain intensity without signs/symptoms of physical distress.    Average METs 4.25      Resistance Training   Training Prescription No    Weight No wts on wednesdays    Reps 10-15    Time 5 Minutes      NuStep   Level 6    SPM 113    Minutes 15    METs 5.1      Arm Ergometer   Level 3    Watts 44    Minutes 15    METs 3.4      Home Exercise Plan   Plans to continue exercise at Home (comment)    Frequency Add 3 additional days to program exercise sessions.    Initial Home Exercises Provided 06/05/24          Functional Capacity:  6 Minute Walk     Row Name 04/09/24 1414 07/17/24 0916       6 Minute Walk   Phase Initial Discharge    Distance 790 feet 1396 feet    Distance % Change -- 76.7 %  Distance Feet Change -- 606 ft    Walk Time 6 minutes 6 minutes    # of Rest Breaks 0 0    MPH 1.5 2.64    METS 1.2 3.1    RPE 12 11.5    Perceived Dyspnea  1 1    VO2 Peak 4.31 10.7    Symptoms No Yes (comment)    Comments -- Mild SOB, RPD = 1, resolved with rest    Resting HR 55 bpm 73 bpm    Resting BP 114/84 138/78    Resting Oxygen Saturation  98 % --    Exercise Oxygen Saturation  during 6 min walk 97 % --    Max Ex. HR 73 bpm 118 bpm    Max Ex. BP 152/64 184/70    2 Minute Post BP 140/64 140/80       Psychological, QOL, Others - Outcomes: PHQ 2/9:    07/22/2024   10:05 AM 04/09/2024    2:33 PM  Depression screen PHQ 2/9  Decreased Interest 0 0  Down, Depressed, Hopeless 0 0  PHQ - 2 Score 0 0  Altered sleeping 0 0  Tired, decreased energy 0 1  Change in appetite 0 1  Feeling bad or failure about yourself  0 0  Trouble concentrating 0 0  Moving slowly or fidgety/restless 0 1  Suicidal thoughts 0 0  PHQ-9 Score 0 3  Difficult doing work/chores Not difficult at all Somewhat difficult    Quality of Life:  Quality  of Life - 07/15/24 0827       Quality of Life Scores   Health/Function Pre 21.03 %    Health/Function Post 23.07 %    Health/Function % Change 9.7 %    Socioeconomic Pre 25.75 %    Socioeconomic Post 25.08 %    Socioeconomic % Change  -2.6 %    Psych/Spiritual Pre 24.86 %    Psych/Spiritual Post 24.64 %    Psych/Spiritual % Change -0.88 %    Family Pre 24 %    Family Post 24 %    Family % Change 0 %    GLOBAL Pre 23.15 %    GLOBAL Post 23.94 %    GLOBAL % Change 3.41 %          Personal Goals: Goals established at orientation with interventions provided to work toward goal.  Personal Goals and Risk Factors at Admission - 04/09/24 1430       Core Components/Risk Factors/Patient Goals on Admission    Weight Management Yes;Obesity;Weight Loss    Intervention Weight Management: Develop a combined nutrition and exercise program designed to reach desired caloric intake, while maintaining appropriate intake of nutrient and fiber, sodium and fats, and appropriate energy expenditure required for the weight goal.;Weight Management: Provide education and appropriate resources to help participant work on and attain dietary goals.;Weight Management/Obesity: Establish reasonable short term and long term weight goals.;Obesity: Provide education and appropriate resources to help participant work on and attain dietary goals.    Admit Weight 214 lb 15.2 oz (97.5 kg)    Expected Outcomes Short Term: Continue to assess and modify interventions until short term weight is achieved;Long Term: Adherence to nutrition and physical activity/exercise program aimed toward attainment of established weight goal;Weight Loss: Understanding of general recommendations for a balanced deficit meal plan, which promotes 1-2 lb weight loss per week and includes a negative energy balance of [phone] kcal/d;Understanding recommendations for meals to include 15-35% energy as protein, 25-35%  energy from fat, 35-60% energy from  carbohydrates, less than 200mg  of dietary cholesterol, 20-35 gm of total fiber daily;Understanding of distribution of calorie intake throughout the day with the consumption of 4-5 meals/snacks    Diabetes Yes    Intervention Provide education about signs/symptoms and action to take for hypo/hyperglycemia.;Provide education about proper nutrition, including hydration, and aerobic/resistive exercise prescription along with prescribed medications to achieve blood glucose in normal ranges: Fasting glucose 65-99 mg/dL    Expected Outcomes Short Term: Participant verbalizes understanding of the signs/symptoms and immediate care of hyper/hypoglycemia, proper foot care and importance of medication, aerobic/resistive exercise and nutrition plan for blood glucose control.;Long Term: Attainment of HbA1C < 7%.    Hypertension Yes    Intervention Provide education on lifestyle modifcations including regular physical activity/exercise, weight management, moderate sodium restriction and increased consumption of fresh fruit, vegetables, and low fat dairy, alcohol moderation, and smoking cessation.;Monitor prescription use compliance.    Expected Outcomes Short Term: Continued assessment and intervention until BP is < 140/60mm HG in hypertensive participants. < 130/54mm HG in hypertensive participants with diabetes, heart failure or chronic kidney disease.;Long Term: Maintenance of blood pressure at goal levels.    Lipids Yes    Intervention Provide education and support for participant on nutrition & aerobic/resistive exercise along with prescribed medications to achieve LDL 70mg , HDL >40mg .    Expected Outcomes Short Term: Participant states understanding of desired cholesterol values and is compliant with medications prescribed. Participant is following exercise prescription and nutrition guidelines.;Long Term: Cholesterol controlled with medications as prescribed, with individualized exercise RX and with personalized  nutrition plan. Value goals: LDL < 70mg , HDL > 40 mg.    Stress Yes    Intervention Offer individual and/or small group education and counseling on adjustment to heart disease, stress management and health-related lifestyle change. Teach and support self-help strategies.;Refer participants experiencing significant psychosocial distress to appropriate mental health specialists for further evaluation and treatment. When possible, include family members and significant others in education/counseling sessions.    Expected Outcomes Short Term: Participant demonstrates changes in health-related behavior, relaxation and other stress management skills, ability to obtain effective social support, and compliance with psychotropic medications if prescribed.;Long Term: Emotional wellbeing is indicated by absence of clinically significant psychosocial distress or social isolation.    Personal Goal Other Yes    Personal Goal Short:knowledge on diet, balance. Long: Under 200lbs, tone muscle    Intervention Willc ontinue to monitor pt and progress workloads as tolerated without sign or symptom    Expected Outcomes Pt will achieve his goals and gain strength           Personal Goals Discharge:  Goals and Risk Factor Review     Row Name 04/29/24 1640 05/12/24 1250 06/01/24 0746 07/07/24 1812 07/28/24 1541     Core Components/Risk Factors/Patient Goals Review   Personal Goals Review Weight Management/Obesity;Lipids;Hypertension;Diabetes;Stress;Improve shortness of breath with ADL's Weight Management/Obesity;Lipids;Hypertension;Diabetes;Stress;Improve shortness of breath with ADL's Weight Management/Obesity;Lipids;Hypertension;Diabetes;Stress;Improve shortness of breath with ADL's Weight Management/Obesity;Lipids;Hypertension;Diabetes;Stress;Improve shortness of breath with ADL's Weight Management/Obesity;Lipids;Hypertension;Diabetes;Stress;Improve shortness of breath with ADL's   Review Darin Mcclure started cardiac rehab  on 04/27/24 Darin Mcclure is off to a fair start to exercise for his fitness level. Vital signs and CBG's have been stable. Darin Mcclure continues to do well with exercise at cardiac rehab,  Vital signs and CBG's have been stable. Darin Mcclure has lost 1.2 kg since starting the program. Darin Mcclure has increased his met levels. Darin Mcclure continues to do well with exercise at cardiac  rehab,  Vital signs and CBG's have been stable. Darin Mcclure has lost 2.2 kg since starting the program. Darin Mcclure has increased his met levels. Darin Mcclure continues to do well with exercise at cardiac rehab,  Vital signs and CBG's have been stable.  Darin Mcclure continues to  increase his met levels. Darin Mcclure did well with  exercise at cardiac rehab,  Vital signs and CBG's were stable.  Darin Mcclure completed cardiac rehab on 07/22/24   Expected Outcomes Darin Mcclure will continue to participate in cardiac rehab for exercise, nutrition and lifestyle modifications Darin Mcclure will continue to participate in cardiac rehab for exercise, nutrition and lifestyle modifications Darin Mcclure will continue to participate in cardiac rehab for exercise, nutrition and lifestyle modifications Darin Mcclure will continue to participate in cardiac rehab for exercise, nutrition and lifestyle modifications Darin Mcclure will continue to  exercise, follow nutrition and lifestyle modifications upon completion of cardiac rehab.      Exercise Goals and Review:  Exercise Goals     Row Name 04/09/24 1419             Exercise Goals   Increase Physical Activity Yes       Intervention Provide advice, education, support and counseling about physical activity/exercise needs.;Develop an individualized exercise prescription for aerobic and resistive training based on initial evaluation findings, risk stratification, comorbidities and participant's personal goals.       Expected Outcomes Short Term: Attend rehab on a regular basis to increase amount of physical activity.;Long Term: Add in home exercise to make exercise part of routine and to increase amount of  physical activity.;Long Term: Exercising regularly at least 3-5 days a week.       Increase Strength and Stamina Yes       Intervention Provide advice, education, support and counseling about physical activity/exercise needs.;Develop an individualized exercise prescription for aerobic and resistive training based on initial evaluation findings, risk stratification, comorbidities and participant's personal goals.       Expected Outcomes Short Term: Increase workloads from initial exercise prescription for resistance, speed, and METs.;Short Term: Perform resistance training exercises routinely during rehab and add in resistance training at home;Long Term: Improve cardiorespiratory fitness, muscular endurance and strength as measured by increased METs and functional capacity ( )       Able to understand and use rate of perceived exertion (RPE) scale Yes       Intervention Provide education and explanation on how to use RPE scale       Expected Outcomes Short Term: Able to use RPE daily in rehab to express subjective intensity level;Long Term:  Able to use RPE to guide intensity level when exercising independently       Able to understand and use Dyspnea scale Yes       Intervention Provide education and explanation on how to use Dyspnea scale       Expected Outcomes Short Term: Able to use Dyspnea scale daily in rehab to express subjective sense of shortness of breath during exertion;Long Term: Able to use Dyspnea scale to guide intensity level when exercising independently       Knowledge and understanding of Target Heart Rate Range (THRR) Yes       Intervention Provide education and explanation of THRR including how the numbers were predicted and where they are located for reference       Expected Outcomes Short Term: Able to state/look up THRR;Long Term: Able to use THRR to govern intensity when exercising independently;Short Term: Able to use daily as guideline for intensity in  rehab        Understanding of Exercise Prescription Yes       Intervention Provide education, explanation, and written materials on patient's individual exercise prescription       Expected Outcomes Short Term: Able to explain program exercise prescription;Long Term: Able to explain home exercise prescription to exercise independently          Exercise Goals Re-Evaluation:  Exercise Goals Re-Evaluation     Row Name 04/27/24 1147 06/01/24 1030 07/15/24 1131 07/15/24 1213 07/22/24 1509     Exercise Goal Re-Evaluation   Exercise Goals Review Increase Physical Activity;Increase Strength and Stamina;Able to understand and use rate of perceived exertion (RPE) scale;Knowledge and understanding of Target Heart Rate Range (THRR);Understanding of Exercise Prescription Increase Physical Activity;Increase Strength and Stamina;Able to understand and use rate of perceived exertion (RPE) scale;Knowledge and understanding of Target Heart Rate Range (THRR);Understanding of Exercise Prescription Increase Physical Activity;Increase Strength and Stamina;Able to understand and use rate of perceived exertion (RPE) scale;Knowledge and understanding of Target Heart Rate Range (THRR);Understanding of Exercise Prescription (P)  Increase Physical Activity;Increase Strength and Stamina;Able to understand and use rate of perceived exertion (RPE) scale;Knowledge and understanding of Target Heart Rate Range (THRR);Understanding of Exercise Prescription Increase Physical Activity;Increase Strength and Stamina;Able to understand and use rate of perceived exertion (RPE) scale;Knowledge and understanding of Target Heart Rate Range (THRR);Understanding of Exercise Prescription   Comments Pt's first day in the CRP2 program. Pt understands the exercise Rx, THRR and RPE scale. Reviewed METs and goals. Pt voices progress on his goal of improving his knowlege on nutrition. He also is working on his blance. Pt is doing exercises from the [employer]  book. Reviewed METs and goals. Pt voices progress on his goal of improving his knowlege on nutrition. He also is working on his blance. Pt is doing exercises from the [employer] book. (P)  Reviewed METs and goals. Pt continues to voice progress on his goal of improving his knowlege on nutrition. Continues to work on his blance doing exercises from the [employer] book. Pt is walking at home 2-3x/week. Pt graduated from the CRP2 program today. Pt achived his goals of improving his knowlege of nutrition. Pt still needs work on his balance and was given information on the BOOST program. Pt plans to continue his exercise by walking and going to Darin [facility].   Expected Outcomes Will continue to monitor patient's progress and increase exercise workloads as tolerated. Will continue to monitor patient's progress and increase exercise workloads as tolerated. -- Will continue to monitor patient's progress and increase exercise workloads as tolerated. Pt will continue to exercise on his own and at the [facility].      Nutrition & Weight - Outcomes:  Pre Biometrics - 04/09/24 1420       Pre Biometrics   Height 6' (1.829 m)    Weight 97.5 kg    Waist Circumference 43 inches    Hip Circumference 43 inches    Waist to Hip Ratio 1 %    BMI (Calculated) 29.15    Triceps Skinfold 18 mm    % Body Fat 30.3 %    Grip Strength 30 kg    Flexibility 7 in    Single Leg Stand 4 seconds          Post Biometrics - 07/17/24 0800        Post  Biometrics   Height 6' (1.829 m)    Weight 98 kg    Waist  Circumference 43 inches    Hip Circumference 43 inches    Waist to Hip Ratio 1 %    BMI (Calculated) 29.3    Triceps Skinfold 13 mm    % Body Fat 29 %    Grip Strength 22 kg   new equipment   Flexibility 12 in    Single Leg Stand 4 seconds          Nutrition:  Nutrition Therapy & Goals - 07/22/24 0957       Nutrition Therapy   Diet Heart Healthy DIet    Drug/Food Interactions Statins/Certain  Fruits      Personal Nutrition Goals   Nutrition Goal Patient to identify strategies for reducing cardiovascular risk by attending the [employer] education and nutrition series weekly.   goal met.   Personal Goal #2 Patient to improve diet quality by using the plate method as a guide for meal planning to include lean protein/plant protein, fruits, vegetables, whole grains, nonfat dairy as part of a well-balanced diet.   goal in action   Comments Goals in action.Darin Mcclure has medical history of DM2, CAD, HTN, CKD3, Hyperlipidemia, CHF, CABGx4. LDL is at goal. A1c is well controlled in a pre-diabetic range. He has maintained his weight since starting with our program. He has attended the [employer] education/nutrition series regularly. He has started making many dietary changes including increased dietary fiber, reading labels for sodium, and decreased simple sugars. His wife is a good support.Patient will benefit from adherence to nutrition, exercise, and lifestyle modification.      Intervention Plan   Intervention Prescribe, educate and counsel regarding individualized specific dietary modifications aiming towards targeted core components such as weight, hypertension, lipid management, diabetes, heart failure and other comorbidities.;Nutrition handout(s) given to patient.    Expected Outcomes Short Term Goal: Understand basic principles of dietary content, such as calories, fat, sodium, cholesterol and nutrients.;Long Term Goal: Adherence to prescribed nutrition plan.          Nutrition Discharge:  Nutrition Assessments - 04/15/24 1052       Rate Your Plate Scores   Pre Score 58          Education Questionnaire Score:  Knowledge Questionnaire Score - 07/15/24 0827       Knowledge Questionnaire Score   Post Score 22/24          Goals reviewed with patient; copy given to patient.Pt graduates from  Intensive/Traditional cardiac rehab program 07/22/24 with completion of 36 exercise and  34  education sessions. Pt maintained good attendance and progressed nicely during their participation in rehab as evidenced by increased MET level. Darin Mcclure increased his distance on his post exercise walk test by 606 feet.   Medication list reconciled. Repeat  PHQ score- 0 .  Pt has made significant lifestyle changes and should be commended for his success. Darin Mcclure achieved his goals during cardiac rehab.   Pt plans to continue exercise at the [facility]. Darin Mcclure is also considering participating in the balance and vestibular rehab program for gait training and fall prevention.We are proud of Darin Mcclure's progress. Darin Mcclure said that participating in cardiac rehab has been beneficial.  Hadassah Elpidio Quan RN BSN

## 2024-08-26 ENCOUNTER — Ambulatory Visit: Admitting: Podiatry

## 2024-08-26 ENCOUNTER — Encounter: Payer: Self-pay | Admitting: Podiatry

## 2024-08-26 DIAGNOSIS — S90822A Blister (nonthermal), left foot, initial encounter: Secondary | ICD-10-CM

## 2024-08-26 DIAGNOSIS — E1142 Type 2 diabetes mellitus with diabetic polyneuropathy: Secondary | ICD-10-CM

## 2024-08-26 DIAGNOSIS — M79674 Pain in right toe(s): Secondary | ICD-10-CM | POA: Diagnosis not present

## 2024-08-26 DIAGNOSIS — B351 Tinea unguium: Secondary | ICD-10-CM

## 2024-08-26 DIAGNOSIS — M79675 Pain in left toe(s): Secondary | ICD-10-CM | POA: Diagnosis not present

## 2024-08-26 NOTE — Progress Notes (Signed)
 Subjective:  Patient ID: Darin Mcclure, male    DOB: 08-18-1941,  MRN: [id number]  Darin Mcclure presents to clinic today for at risk foot care with history of diabetic neuropathy and painful elongated overgrown toenails which are tender when wearing enclosed shoe gear.  Chief Complaint  Patient presents with   Diabetes    DFC NIDDM A1C 5.7 A1C. Toenail trim. Pt. Has diabetic neuropathy. Has Blister on left foot.   New problem(s): with chief concern of injury to left foot. Injury occurred nearly two weeks ago. Injury recurred as a result of walking barefoot on hot pavement in the pool area at [facility]. He states he went to local ED in [facility]. Blister was lanced and dressed. He was given Rx for Keflex and topical antibiotic ointment. He also followed up with PCP's office and was instructed to complete course of Keflex and continue daily local wound care with topical antibiotic ointment and nonadherent dressing. Patient states aggravating factor(s) is/are weight bearing.         PCP is Sophronia Ozell BROCKS, MD.  Allergies  Allergen Reactions   Oxycodone  Other (See Comments)    Paradoxical excitation   Review of Systems: Negative except as noted in the HPI.  Objective: No changes noted in today's physical examination. There were no vitals filed for this visit. Darin Mcclure is a pleasant 83 y.o. male WD, WN in NAD. AAO x 3.  Vascular Examination: Capillary refill time immediate b/l. Vascular status intact b/l with palpable pedal pulses. Pedal hair present b/l. No pain with calf compression b/l. Skin temperature gradient WNL b/l. No cyanosis or clubbing b/l. No ischemia or gangrene noted b/l.   Neurological Examination: Sensation grossly intact b/l with 10 gram monofilament. Vibratory sensation intact b/l. Pt has subjective symptoms of neuropathy.  Dermatological Examination: Healing blister plantar central forefoot LLE nearly completely epithelialized. No  erythema, no edema, no drainage. Pedal skin with normal turgor, texture and tone b/l.  No open wounds. No interdigital macerations.   Toenails 1-5 b/l thick, discolored, elongated with subungual debris and pain on dorsal palpation.   No hyperkeratotic nor porokeratotic lesions present on today's visit.  Musculoskeletal Examination: Muscle strength 5/5 to all lower extremity muscle groups bilaterally. Muscle strength 2/5 to all lower extremity muscle groups bilaterally. Hammertoes b/l LE.  Radiographs: None  Assessment/Plan: 1. Diabetic peripheral neuropathy associated with type 2 diabetes mellitus (HCC)   2. Pain due to onychomycosis of toenails of both feet   3. Blister of left foot, initial encounter   Patient was evaluated and treated. All patient's and/or POA's questions/concerns addressed on today's visit. Patient to continue local wound care with topical antibiotic ointment daily. Leave open to air when resting. After one week, may start Vaseline Healing Jelly to area once daily. We discussed walking barefoot in presence of diabetic neuropathy. Toenails 1-5 debrided in length and girth without incident. Treatment was provided by assistant Darin Mcclure under my supervision.  Continue soft, supportive shoe gear daily. Report any pedal injuries to medical professional. Call office if there are any questions/concerns.  Return in about 3 months (around 11/26/2024).  Delon LITTIE Merlin, DPM      [place] LOCATION: [address]  [place], [place] [address]                   Office [phone]   [facility] LOCATION: [address] [place], [place] [address] Office [phone]

## 2024-09-15 ENCOUNTER — Ambulatory Visit: Attending: Physician Assistant

## 2024-09-15 ENCOUNTER — Other Ambulatory Visit: Payer: Self-pay

## 2024-09-15 DIAGNOSIS — M6281 Muscle weakness (generalized): Secondary | ICD-10-CM | POA: Diagnosis present

## 2024-09-15 DIAGNOSIS — R2681 Unsteadiness on feet: Secondary | ICD-10-CM | POA: Insufficient documentation

## 2024-09-15 NOTE — Therapy (Signed)
 OUTPATIENT PHYSICAL THERAPY NEURO EVALUATION   Patient Name: Darin Mcclure MRN: [id number] DOB:11/19/1941, 83 y.o., male Today's Date: 09/15/2024   PCP: Sophronia Ozell BROCKS, MD REFERRING PROVIDER: Samie Frederick, PA-C  END OF SESSION:  PT End of Session - 09/15/24 0848     Visit Number 1    Number of Visits 4    Date for Recertification  10/13/24    Authorization Type [employer]    PT Start Time 0845    PT Stop Time 0930    PT Time Calculation (min) 45 min          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) 08/26/2017   GERD (gastroesophageal reflux disease)    Heart disease    Hyperglycemia    Hypertension    Mixed hyperlipidemia    Muscle cramp 08/26/2017   Neuropathy    Osteoarthritis    RBBB    RBBB (right bundle branch block)    Type 2 diabetes mellitus (HCC)    Past Surgical History:  Procedure Laterality Date   ACHILLES TENDON REPAIR Right    BACK SURGERY     CARDIOVERSION N/A 03/03/2024   Procedure: CARDIOVERSION;  Surgeon: Sheena Pugh, DO;  Location: [facility];  Service: Cardiovascular;  Laterality: N/A;   CARDIOVERSION N/A 03/13/2024   Procedure: CARDIOVERSION;  Surgeon: Loni Soyla LABOR, MD;  Location: [facility];  Service: Cardiovascular;  Laterality: N/A;   CLIPPING OF ATRIAL APPENDAGE N/A 01/21/2024   Procedure: CLIPPING OF ATRIAL APPENDAGE USING ATRICURE ATRICLIP PRO2;  Surgeon: Shyrl Linnie KIDD, MD;  Location: [facility];  Service: Open Heart Surgery;  Laterality: N/A;   CORONARY ANGIOPLASTY     pror stent to mid RCA 10/04/2006   CORONARY ARTERY BYPASS GRAFT N/A 01/21/2024   Procedure: CORONARY ARTERY BYPASS GRAFTING (CABG) TIMES FOUR USING LEFT INTERNAL MAMMARY ARTERY AND BILATERAL ENDOSCOPICALLY HARVESTED GREATER SAPHENOUS VEINS;  Surgeon:  Shyrl Linnie KIDD, MD;  Location: [facility];  Service: Open Heart Surgery;  Laterality: N/A;   FOOT SURGERY     Cyst removal   INGUINAL HERNIA REPAIR     IR THORACENTESIS ASP PLEURAL SPACE W/IMG GUIDE  02/25/2024   IR THORACENTESIS ASP PLEURAL SPACE W/IMG GUIDE  03/06/2024   LEFT HEART CATH AND CORONARY ANGIOGRAPHY N/A 01/15/2024   Procedure: LEFT HEART CATH AND CORONARY ANGIOGRAPHY;  Surgeon: Verlin Lonni BIRCH, MD;  Location: [facility];  Service: Cardiovascular;  Laterality: N/A;   MAZE N/A 01/21/2024   Procedure: MAZE;  Surgeon: Shyrl Linnie KIDD, MD;  Location: [facility];  Service: Open Heart Surgery;  Laterality: N/A;   PERCUTANEOUS CORONARY STENT INTERVENTION (PCI-S)  09/2006   REPLACEMENT TOTAL KNEE Left    TEE WITHOUT CARDIOVERSION N/A 01/21/2024   Procedure: TRANSESOPHAGEAL ECHOCARDIOGRAM (TEE);  Surgeon: Shyrl Linnie KIDD, MD;  Location: [facility] OR;  Service: Open Heart Surgery;  Laterality: N/A;   TOTAL HIP ARTHROPLASTY Right    TOTAL HIP ARTHROPLASTY Left    Patient Active Problem List   Diagnosis Date Noted   Sinus node dysfunction (HCC) 04/15/2024   CHF (congestive heart failure) (HCC) 03/05/2024   Pleural effusion on left 03/05/2024   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   Testing of male for genetic disease carrier status 01/26/2024   History of  left atrial appendage closure 01/26/2024   Coronary artery disease 01/21/2024   New onset atrial fibrillation (HCC) 01/14/2024   Influenza A 01/14/2024   Type 2 diabetes mellitus (HCC) 01/14/2024   Hypertension associated with diabetes (HCC) 01/14/2024   Chronic kidney disease, stage 3a (HCC) 01/14/2024   Hypothyroidism 01/14/2024   Hyperlipidemia associated with type 2 diabetes mellitus (HCC) 01/14/2024   Coronary artery disease involving native coronary artery of native heart without angina pectoris 12/29/2021   Pure hypercholesterolemia 12/29/2021   Right bundle branch block 12/29/2021   Carotid artery disease 12/29/2021    Gastroesophageal reflux disease without esophagitis 04/19/2021   Impingement syndrome of left shoulder region 05/17/2020   History of prosthetic unicompartmental arthroplasty of left knee 11/18/2019   Pain in left knee 09/24/2018   Pain of left hip joint 09/24/2018   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) 08/26/2017   Muscle cramp 08/26/2017    ONSET DATE: some time  REFERRING DIAG: R26.89 (ICD-10-CM) - Other abnormalities of gait and mobility  THERAPY DIAG:  Unsteadiness on feet  Muscle weakness (generalized)  Rationale for Evaluation and Treatment: Rehabilitation  SUBJECTIVE:                                                                                                                                                                                             SUBJECTIVE STATEMENT: Has been noting issues with balance which was apparent during cardiac rehab as in single leg stance being only 6 seconds.  Has peripheral neuropathy in feet and notes since his recent CABG the intensity has increased and recently had issue of blister on plantar surface (metatarsal heads) but this is healing. Enjoys working in the yard and performs light resistance exercise w/ dumbells. Reports difficulty with carrying items upstairs Pt accompanied by: self  PERTINENT HISTORY: 01/14/24 w/ chest discomfort and SOB. EKG showed a-fib w/ R bundle branch block, + influenza A. 2/19 L heart cath and coronary angiography. 2/26 CABG x4 and MAZE. PMHx: CAD s/p DES to mid RCA, RBBB, T2DM, HTN, CKD stage 3a, HLD, OA, neuropathy  PAIN:  Are you having pain? No  PRECAUTIONS: None  RED FLAGS: None   WEIGHT BEARING RESTRICTIONS: No  FALLS: Has patient fallen in last 6 months? No  LIVING ENVIRONMENT: Lives with: lives with their spouse Lives in: House/apartment Stairs: yes, w/ HR Has following equipment at home: None  PLOF: Independent  PATIENT GOALS:  improve balance  OBJECTIVE:  Note: Objective measures were completed at Evaluation unless otherwise noted.  DIAGNOSTIC FINDINGS: n/a  COGNITION: Overall cognitive status: Within functional limits for  tasks assessed   SENSATION: Reports numbness, burning, tingling in BLE  COORDINATION: WNL    POSTURE: No Significant postural limitations  LOWER EXTREMITY ROM:     WNL  LOWER EXTREMITY MMT:    MMT Right Eval Left Eval  Hip flexion 5 5  Hip extension    Hip abduction 5 5  Hip adduction 5 5  Hip internal rotation    Hip external rotation    Knee flexion 5 5  Knee extension 5 5  Ankle dorsiflexion 4- 5  Ankle plantarflexion 4+ 5  Ankle inversion    Ankle eversion    (Blank rows = not tested)  BED MOBILITY:  Not tested  TRANSFERS: Indep    CURB:  Findings: indep  STAIRS: Indep-modified indep, lateral unsteadiness w/ descending if not using HR GAIT: Findings: Comments: WNL good speed  FUNCTIONAL TESTS:  5 times sit to stand: 14 sec Functional gait assessment:  FUNCTIONAL GAIT ASSESSMENT  Date: 09/15/24 Score  GAIT LEVEL SURFACE Instructions: Walk at your normal speed from here to the next mark (6 m) [20 ft]. (3) Normal - Walks 6 m (20 ft) in less than 5.5 seconds, no assistive devices, good speed, no evidence for imbalance, normal gait pattern, deviates no more than 15.24 cm (6 in) outside of the 30.48-cm (12-in) walkway width.  2.   CHANGE IN GAIT SPEED Instructions: Begin walking at your normal pace (for 1.5 m [5 ft]). When I tell you "go," walk as fast as you can (for 1.5 m [5 ft]). When I tell you "slow," walk as slowly as you can (for 1.5 m [5 ft]. (3) Normal - Able to smoothly change walking speed without loss of balance or gait deviation. Shows a significant difference in walking speeds between normal, fast, and slow speeds. Deviates no more than 15.24 cm (6 in) outside of the 30.48-cm (12-in) walkway width.  3.    GAIT WITH HORIZONTAL HEAD  TURNS Instructions: Walk from here to the next mark 6 m (20 ft) away. Begin walking at your normal pace. Keep walking straight; after 3 steps, turn your head to the right and keep walking straight while looking to the right. After 3 more steps, turn your head to the left and keep walking straight while looking left. Continue alternating looking right and left. (2) Mild impairment - Performs head turns smoothly with slight change in gait velocity (eg, minor disruption to smooth gait path), deviates 15.24 -25.4 cm (6 -10 in) outside 30.48-cm (12-in) walkway width, or uses an assistive device.  4.   GAIT WITH VERTICAL HEAD TURNS Instructions: Walk from here to the next mark (6 m [20 ft]). Begin walking at your normal pace. Keep walking straight; after 3 steps, tip your head up and keep walking straight while looking up. After 3 more steps, tip your head down, keep walking straight while looking down. Continue  alternating looking up and down every 3 steps until you have completed 2 repetitions in each direction. (3) Normal - Performs head turns with no change in gait. Deviates no more than 15.24 cm (6 in) outside 30.48-cm (12-in) walkway width.  5.  GAIT AND PIVOT TURN Instructions: Begin with walking at your normal pace. When I tell you, "turn and stop," turn as quickly as you can to face the opposite direction and stop. (3) Normal - Pivot turns safely within 3 seconds and stops quickly with no loss of balance  6.   STEP OVER OBSTACLE Instructions: Begin walking at your  normal speed. When you come to the shoe box, step over it, not around it, and keep walking. (3) Normal - Is able to step over 2 stacked shoe boxes taped together (22.86 cm [9 in] total height) without changing gait speed; no evidence of imbalance.  7.   GAIT WITH NARROW BASE OF SUPPORT Instructions: Walk on the floor with arms folded across the chest, feet aligned heel to toe in tandem for a distance of 3.6 m [12 ft]. The number of steps taken  in a straight line are counted for a maximum of 10 steps. (1) Moderate impairment - Ambulates 4 -7 steps.  8.   GAIT WITH EYES CLOSED Instructions: Walk at your normal speed from here to the next mark (6 m [20 ft]) with your eyes closed. (3) Normal - Walks 6 m (20 ft), no assistive devices, good speed, no evidence of imbalance, normal gait pattern, deviates no more than 15.24 cm (6 in) outside 30.48-cm (12-in) walkway width. Ambulates 6 m (20 ft) in less than 7 seconds.  9.   AMBULATING BACKWARDS Instructions: Walk backwards until I tell you to stop (2) Mild impairment - Walks 6 m (20 ft), uses assistive device, slower speed, mild gait deviations, deviates 15.24 -25.4 cm (6 -10 in) outside 30.48-cm (12-in) walkway width  10. STEPS Instructions: Walk up these stairs as you would at home (ie, using the rail if necessary). At the top turn around and walk down. (2) Mild impairment-Alternating feet, must use rail.  Total 25/30   Interpretation of scores: Non-Specific Older Adults Cutoff Score: <=22/30 = risk of falls Parkinson's Disease Cutoff score <15/30= fall risk (Hoehn & Yahr 1-4)  Minimally Clinically Important Difference (MCID)  Stroke (acute, subacute, and chronic) = MDC: 4.2 points Vestibular (acute) = MDC: 6 points Community Dwelling Older Adults =  MCID: 4 points Parkinson's Disease  =  MDC: 4.3 points  ([employer]y of [employer] (nd). Functional Gait Assessment. Retrieved from https://www.neuropt.org/docs/default-source/cpgs/core-outcome-measures/function-gait-assessment-pocket-guide-proof9-(2).pdf?sfvrsn=b89f35043_0.)   M-CTSIB  Condition 1: Firm Surface, EO 30 Sec, Normal Sway  Condition 2: Firm Surface, EC 30 Sec, Moderate Sway  Condition 3: Foam Surface, EO 30 Sec, Normal Sway  Condition 4: Foam Surface, EC 30 Sec, Moderate and Severe Sway    10 meter walk: 9.23 sec = 3.55 ft/sec Single leg stance: 6 sec per pt                                                                                                                                 TREATMENT DATE: 09/15/24    PATIENT EDUCATION: Education details: assessment details, rationale of intervention, HEP initiation Person educated: Patient Education method: Explanation and Handouts Education comprehension: verbalized understanding  HOME EXERCISE PROGRAM: Access Code: [email or web address] URL: [email or web address] Date: 09/15/2024 Prepared by: Burnard Sandifer  Exercises - Seated Ankle Dorsiflexion with Resistance  - 2-3 x weekly - 3 sets - 10 reps - 3 sec hold - Marching with Resistance  -  1 x daily - 2-3 x weekly - 3 sets - 10 reps  GOALS: Goals reviewed with patient? Yes  SHORT TERM GOALS: Target date: same as LTG    LONG TERM GOALS: Target date: 10/13/2024    Patient will be independent in HEP to improve functional outcomes Baseline:  Goal status: INITIAL  2.  Demo improved postural stability per mild sway condition 4 M-CTSIB to improve safety with ADL Baseline: mod-severe x 30 sec Goal status: INITIAL  3.  Demo improved dynamic balance per score 28/30 Functional Gait Assessment Baseline: 25/30 Goal status: INITIAL  4.  Demo improved single leg stance to 10 sec for reduced risk for falls Baseline: 6 sec per report Goal status: INITIAL  5.  Improve right ankle dorsiflexion strength 5/5 to improve ankle stability and foot clearance during gait Baseline: 4- Goal status: INITIAL    ASSESSMENT:  CLINICAL IMPRESSION: Patient is a 83 y.o. male who was seen today for physical therapy evaluation and treatment for R26.89 (ICD-10-CM) - Other abnormalities of gait and mobility. Assessment reveals right ankle dorsiflexion weakness, deficits with multisensory balance and single leg stance/balance, and low risk for falls per time on 5xSTS and Functional Gait Assessment.  Pt requesting several sessions for development of exercise/activity to help maintain/improve balance as well as  address neuropathic discomfort in BLE if possible  OBJECTIVE IMPAIRMENTS: decreased activity tolerance, decreased balance, and decreased strength.   ACTIVITY LIMITATIONS: carrying, stairs, and locomotion level  PARTICIPATION LIMITATIONS: community activity and yard work  PERSONAL FACTORS: Age, Time since onset of injury/illness/exacerbation, and 1-2 comorbidities: PMH are also affecting patient's functional outcome.   REHAB POTENTIAL: Excellent  CLINICAL DECISION MAKING: Stable/uncomplicated  EVALUATION COMPLEXITY: Low  PLAN:  PT FREQUENCY: 1x/week  PT DURATION: 4 weeks  PLANNED INTERVENTIONS: 97750- Physical Performance Testing, 97110-Therapeutic exercises, 97530- Therapeutic activity, [id number]- Neuromuscular re-education, 97535- Self Care, [id number]- Manual therapy, [phone]- Gait training, [phone] (1-2 muscles), [id number] (3+ muscles)- Dry Needling, and Patient/Family education  PLAN FOR NEXT SESSION: multisensory balance (corner balance) w/ weight lifting activities--e.g. romberg-eyes closed-bicep curls etc.  Demo vibration platform and research for diabetic neuropathy pain   12:24 PM, 09/15/24 M. Kelly Makel Mcmann, PT, DPT Physical Therapist- [place] Office Number: [phone]

## 2024-09-21 ENCOUNTER — Ambulatory Visit

## 2024-09-21 DIAGNOSIS — M6281 Muscle weakness (generalized): Secondary | ICD-10-CM

## 2024-09-21 DIAGNOSIS — R2681 Unsteadiness on feet: Secondary | ICD-10-CM | POA: Diagnosis not present

## 2024-09-21 NOTE — Therapy (Signed)
 OUTPATIENT PHYSICAL THERAPY NEURO EVALUATION   Patient Name: Darin Mcclure MRN: [id number] DOB:1941/01/31, 83 y.o., male Today's Date: 09/21/2024   PCP: Sophronia Ozell BROCKS, MD REFERRING PROVIDER: Samie Frederick, PA-C  END OF SESSION:  PT End of Session - 09/21/24 1148     Visit Number 2    Number of Visits 4    Date for Recertification  10/13/24    Authorization Type BC[employer]    PT Start Time 1148    PT Stop Time 1230    PT Time Calculation (min) 42 min          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) 08/26/2017   GERD (gastroesophageal reflux disease)    Heart disease    Hyperglycemia    Hypertension    Mixed hyperlipidemia    Muscle cramp 08/26/2017   Neuropathy    Osteoarthritis    RBBB    RBBB (right bundle branch block)    Type 2 diabetes mellitus (HCC)    Past Surgical History:  Procedure Laterality Date   ACHILLES TENDON REPAIR Right    BACK SURGERY     CARDIOVERSION N/A 03/03/2024   Procedure: CARDIOVERSION;  Surgeon: Sheena Pugh, DO;  Location: [facility];  Service: Cardiovascular;  Laterality: N/A;   CARDIOVERSION N/A 03/13/2024   Procedure: CARDIOVERSION;  Surgeon: Loni Soyla LABOR, MD;  Location: [facility];  Service: Cardiovascular;  Laterality: N/A;   CLIPPING OF ATRIAL APPENDAGE N/A 01/21/2024   Procedure: CLIPPING OF ATRIAL APPENDAGE USING ATRICURE ATRICLIP PRO2;  Surgeon: Shyrl Linnie KIDD, MD;  Location: [facility];  Service: Open Heart Surgery;  Laterality: N/A;   CORONARY ANGIOPLASTY     pror stent to mid RCA 10/04/2006   CORONARY ARTERY BYPASS GRAFT N/A 01/21/2024   Procedure: CORONARY ARTERY BYPASS GRAFTING (CABG) TIMES FOUR USING LEFT INTERNAL MAMMARY ARTERY AND BILATERAL ENDOSCOPICALLY HARVESTED GREATER SAPHENOUS VEINS;  Surgeon:  Shyrl Linnie KIDD, MD;  Location: [facility];  Service: Open Heart Surgery;  Laterality: N/A;   FOOT SURGERY     Cyst removal   INGUINAL HERNIA REPAIR     IR THORACENTESIS ASP PLEURAL SPACE W/IMG GUIDE  02/25/2024   IR THORACENTESIS ASP PLEURAL SPACE W/IMG GUIDE  03/06/2024   LEFT HEART CATH AND CORONARY ANGIOGRAPHY N/A 01/15/2024   Procedure: LEFT HEART CATH AND CORONARY ANGIOGRAPHY;  Surgeon: Verlin Lonni BIRCH, MD;  Location: [facility];  Service: Cardiovascular;  Laterality: N/A;   MAZE N/A 01/21/2024   Procedure: MAZE;  Surgeon: Shyrl Linnie KIDD, MD;  Location: [facility];  Service: Open Heart Surgery;  Laterality: N/A;   PERCUTANEOUS CORONARY STENT INTERVENTION (PCI-S)  09/2006   REPLACEMENT TOTAL KNEE Left    TEE WITHOUT CARDIOVERSION N/A 01/21/2024   Procedure: TRANSESOPHAGEAL ECHOCARDIOGRAM (TEE);  Surgeon: Shyrl Linnie KIDD, MD;  Location: [facility] OR;  Service: Open Heart Surgery;  Laterality: N/A;   TOTAL HIP ARTHROPLASTY Right    TOTAL HIP ARTHROPLASTY Left    Patient Active Problem List   Diagnosis Date Noted   Sinus node dysfunction (HCC) 04/15/2024   CHF (congestive heart failure) (HCC) 03/05/2024   Pleural effusion on left 03/05/2024   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   Testing of male for genetic disease carrier status 01/26/2024   History of  left atrial appendage closure 01/26/2024   Coronary artery disease 01/21/2024   New onset atrial fibrillation (HCC) 01/14/2024   Influenza A 01/14/2024   Type 2 diabetes mellitus (HCC) 01/14/2024   Hypertension associated with diabetes (HCC) 01/14/2024   Chronic kidney disease, stage 3a (HCC) 01/14/2024   Hypothyroidism 01/14/2024   Hyperlipidemia associated with type 2 diabetes mellitus (HCC) 01/14/2024   Coronary artery disease involving native coronary artery of native heart without angina pectoris 12/29/2021   Pure hypercholesterolemia 12/29/2021   Right bundle branch block 12/29/2021   Carotid artery disease 12/29/2021    Gastroesophageal reflux disease without esophagitis 04/19/2021   Impingement syndrome of left shoulder region 05/17/2020   History of prosthetic unicompartmental arthroplasty of left knee 11/18/2019   Pain in left knee 09/24/2018   Pain of left hip joint 09/24/2018   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) 08/26/2017   Muscle cramp 08/26/2017    ONSET DATE: some time  REFERRING DIAG: R26.89 (ICD-10-CM) - Other abnormalities of gait and mobility  THERAPY DIAG:  Unsteadiness on feet  Muscle weakness (generalized)  Rationale for Evaluation and Treatment: Rehabilitation  SUBJECTIVE:                                                                                                                                                                                             SUBJECTIVE STATEMENT: Has been noting issues with balance which was apparent during cardiac rehab as in single leg stance being only 6 seconds.  Has peripheral neuropathy in feet and notes since his recent CABG the intensity has increased and recently had issue of blister on plantar surface (metatarsal heads) but this is healing. Enjoys working in the yard and performs light resistance exercise w/ dumbells. Reports difficulty with carrying items upstairs Pt accompanied by: self  PERTINENT HISTORY: 01/14/24 w/ chest discomfort and SOB. EKG showed a-fib w/ R bundle branch block, + influenza A. 2/19 L heart cath and coronary angiography. 2/26 CABG x4 and MAZE. PMHx: CAD s/p DES to mid RCA, RBBB, T2DM, HTN, CKD stage 3a, HLD, OA, neuropathy  PAIN:  Are you having pain? No  PRECAUTIONS: None  RED FLAGS: None   WEIGHT BEARING RESTRICTIONS: No  FALLS: Has patient fallen in last 6 months? No  LIVING ENVIRONMENT: Lives with: lives with their spouse Lives in: House/apartment Stairs: yes, w/ HR Has following equipment at home: None  PLOF: Independent  PATIENT GOALS:  improve balance  OBJECTIVE:   TODAY'S TREATMENT: 09/21/24 Activity Comments  Corner balance multisensory activities w/ unilateral weight lifting   Waiter's carry 6#  Education regarding neuro re-ed and use of vibration plate for neuropathic pain mgmt and LE strength              Note: Objective measures were completed at Evaluation unless otherwise noted.  DIAGNOSTIC FINDINGS: n/a  COGNITION: Overall cognitive status: Within functional limits for tasks assessed   SENSATION: Reports numbness, burning, tingling in BLE  COORDINATION: WNL    POSTURE: No Significant postural limitations  LOWER EXTREMITY ROM:     WNL  LOWER EXTREMITY MMT:    MMT Right Eval Left Eval  Hip flexion 5 5  Hip extension    Hip abduction 5 5  Hip adduction 5 5  Hip internal rotation    Hip external rotation    Knee flexion 5 5  Knee extension 5 5  Ankle dorsiflexion 4- 5  Ankle plantarflexion 4+ 5  Ankle inversion    Ankle eversion    (Blank rows = not tested)  BED MOBILITY:  Not tested  TRANSFERS: Indep    CURB:  Findings: indep  STAIRS: Indep-modified indep, lateral unsteadiness w/ descending if not using HR GAIT: Findings: Comments: WNL good speed  FUNCTIONAL TESTS:  5 times sit to stand: 14 sec Functional gait assessment:  FUNCTIONAL GAIT ASSESSMENT  Date: 09/15/24 Score  GAIT LEVEL SURFACE Instructions: Walk at your normal speed from here to the next mark (6 m) [20 ft]. (3) Normal - Walks 6 m (20 ft) in less than 5.5 seconds, no assistive devices, good speed, no evidence for imbalance, normal gait pattern, deviates no more than 15.24 cm (6 in) outside of the 30.48-cm (12-in) walkway width.  2.   CHANGE IN GAIT SPEED Instructions: Begin walking at your normal pace (for 1.5 m [5 ft]). When I tell you "go," walk as fast as you can (for 1.5 m [5 ft]). When I tell you "slow," walk as slowly as you can (for 1.5 m [5 ft]. (3) Normal - Able to smoothly change walking  speed without loss of balance or gait deviation. Shows a significant difference in walking speeds between normal, fast, and slow speeds. Deviates no more than 15.24 cm (6 in) outside of the 30.48-cm (12-in) walkway width.  3.    GAIT WITH HORIZONTAL HEAD TURNS Instructions: Walk from here to the next mark 6 m (20 ft) away. Begin walking at your normal pace. Keep walking straight; after 3 steps, turn your head to the right and keep walking straight while looking to the right. After 3 more steps, turn your head to the left and keep walking straight while looking left. Continue alternating looking right and left. (2) Mild impairment - Performs head turns smoothly with slight change in gait velocity (eg, minor disruption to smooth gait path), deviates 15.24 -25.4 cm (6 -10 in) outside 30.48-cm (12-in) walkway width, or uses an assistive device.  4.   GAIT WITH VERTICAL HEAD TURNS Instructions: Walk from here to the next mark (6 m [20 ft]). Begin walking at your normal pace. Keep walking straight; after 3 steps, tip your head up and keep walking straight while looking up. After 3 more steps, tip your head down, keep walking straight while looking down. Continue  alternating looking up and down every 3 steps until you have completed 2 repetitions in each direction. (3) Normal - Performs head turns with no change in gait. Deviates no more than 15.24 cm (6 in) outside 30.48-cm (12-in) walkway width.  5.  GAIT AND PIVOT TURN Instructions: Begin with walking at  your normal pace. When I tell you, "turn and stop," turn as quickly as you can to face the opposite direction and stop. (3) Normal - Pivot turns safely within 3 seconds and stops quickly with no loss of balance  6.   STEP OVER OBSTACLE Instructions: Begin walking at your normal speed. When you come to the shoe box, step over it, not around it, and keep walking. (3) Normal - Is able to step over 2 stacked shoe boxes taped together (22.86 cm [9 in] total height)  without changing gait speed; no evidence of imbalance.  7.   GAIT WITH NARROW BASE OF SUPPORT Instructions: Walk on the floor with arms folded across the chest, feet aligned heel to toe in tandem for a distance of 3.6 m [12 ft]. The number of steps taken in a straight line are counted for a maximum of 10 steps. (1) Moderate impairment - Ambulates 4 -7 steps.  8.   GAIT WITH EYES CLOSED Instructions: Walk at your normal speed from here to the next mark (6 m [20 ft]) with your eyes closed. (3) Normal - Walks 6 m (20 ft), no assistive devices, good speed, no evidence of imbalance, normal gait pattern, deviates no more than 15.24 cm (6 in) outside 30.48-cm (12-in) walkway width. Ambulates 6 m (20 ft) in less than 7 seconds.  9.   AMBULATING BACKWARDS Instructions: Walk backwards until I tell you to stop (2) Mild impairment - Walks 6 m (20 ft), uses assistive device, slower speed, mild gait deviations, deviates 15.24 -25.4 cm (6 -10 in) outside 30.48-cm (12-in) walkway width  10. STEPS Instructions: Walk up these stairs as you would at home (ie, using the rail if necessary). At the top turn around and walk down. (2) Mild impairment-Alternating feet, must use rail.  Total 25/30   Interpretation of scores: Non-Specific Older Adults Cutoff Score: <=22/30 = risk of falls Parkinson's Disease Cutoff score <15/30= fall risk (Hoehn & Yahr 1-4)  Minimally Clinically Important Difference (MCID)  Stroke (acute, subacute, and chronic) = MDC: 4.2 points Vestibular (acute) = MDC: 6 points Community Dwelling Older Adults =  MCID: 4 points Parkinson's Disease  =  MDC: 4.3 points  (Academy of Neu[employer]ic Physical Therapy (nd). Functional Gait Assessment. Retrieved from https://www.neuropt.org/docs/default-source/cpgs/core-outcome-measures/function-gait-assessment-pocket-guide-proof9-(2).pdf?sfvrsn=b62f35043_0.)   M-CTSIB  Condition 1: Firm Surface, EO 30 Sec, Normal Sway  Condition 2: Firm Surface, EC 30 Sec,  Moderate Sway  Condition 3: Foam Surface, EO 30 Sec, Normal Sway  Condition 4: Foam Surface, EC 30 Sec, Moderate and Severe Sway    10 meter walk: 9.23 sec = 3.55 ft/sec Single leg stance: 6 sec per pt                                                                                                                                TREATMENT DATE: 09/15/24    PATIENT EDUCATION: Education details: assessment details, rationale of intervention, HEP initiation Person educated:  Patient Education method: Explanation and Handouts Education comprehension: verbalized understanding  HOME EXERCISE PROGRAM: Access Code: [email or web address] URL: [email or web address] Date: 09/15/2024 Prepared by: Burnard Sandifer  Exercises - Seated Ankle Dorsiflexion with Resistance  - 2-3 x weekly - 3 sets - 10 reps - 3 sec hold - Marching with Resistance  - 1 x daily - 2-3 x weekly - 3 sets - 10 reps  Access Code: [email or web address] URL: [email or web address] Date: 09/21/2024 Prepared by: Burnard Sandifer  Exercises - Corner Balance Feet Together With Eyes Closed  - 1 x daily - 7 x weekly - 3 sets - 10 reps - Tandem Stance in Corner  - 1 x daily - 7 x weekly - 3 sets - 10 reps - Waiter's Walk with Kettlebell  - 1 x daily - 7 x weekly - 3 sets - 10 reps  GOALS: Goals reviewed with patient? Yes  SHORT TERM GOALS: Target date: same as LTG    LONG TERM GOALS: Target date: 10/13/2024    Patient will be independent in HEP to improve functional outcomes Baseline:  Goal status: INITIAL  2.  Demo improved postural stability per mild sway condition 4 M-CTSIB to improve safety with ADL Baseline: mod-severe x 30 sec Goal status: INITIAL  3.  Demo improved dynamic balance per score 28/30 Functional Gait Assessment Baseline: 25/30 Goal status: INITIAL  4.  Demo improved single leg stance to 10 sec for reduced risk for falls Baseline: 6 sec per report Goal status: INITIAL  5.  Improve right ankle  dorsiflexion strength 5/5 to improve ankle stability and foot clearance during gait Baseline: 4- Goal status: INITIAL    ASSESSMENT:  CLINICAL IMPRESSION: Progressed HEP development for multisensory balance w/ combination of upper body strengthening activities to complement his typical routine to emphasize postural control and withstanding postural preturbations. Pt education and demonstration of pain control techniques for LE neuropathy pain and benefits of whole body vibration for this aspect as well as improved BLE strength. Continued sessions to advance POC details to improve mobility and reduce risk for falls  OBJECTIVE IMPAIRMENTS: decreased activity tolerance, decreased balance, and decreased strength.   ACTIVITY LIMITATIONS: carrying, stairs, and locomotion level  PARTICIPATION LIMITATIONS: community activity and yard work  PERSONAL FACTORS: Age, Time since onset of injury/illness/exacerbation, and 1-2 comorbidities: PMH are also affecting patient's functional outcome.   REHAB POTENTIAL: Excellent  CLINICAL DECISION MAKING: Stable/uncomplicated  EVALUATION COMPLEXITY: Low  PLAN:  PT FREQUENCY: 1x/week  PT DURATION: 4 weeks  PLANNED INTERVENTIONS: 97750- Physical Performance Testing, 97110-Therapeutic exercises, 97530- Therapeutic activity, [id number]- Neuromuscular re-education, 97535- Self Care, [id number]- Manual therapy, [id number]- Gait training, [phone] (1-2 muscles), [id number] (3+ muscles)- Dry Needling, and Patient/Family education  PLAN FOR NEXT SESSION: push-release testing   11:48 AM, 09/21/24 M. Kelly Kolten Ryback, PT, DPT Physical Therapist- [place] Office Number: [phone]

## 2024-09-29 ENCOUNTER — Ambulatory Visit: Attending: Physician Assistant

## 2024-09-29 DIAGNOSIS — R2681 Unsteadiness on feet: Secondary | ICD-10-CM | POA: Insufficient documentation

## 2024-09-29 DIAGNOSIS — M6281 Muscle weakness (generalized): Secondary | ICD-10-CM | POA: Insufficient documentation

## 2024-09-29 NOTE — Therapy (Signed)
 OUTPATIENT PHYSICAL THERAPY NEURO TREATMENT   Patient Name: Darin Mcclure MRN: [id number] DOB:1941/10/17, 83 y.o., male Today's Date: 09/29/2024   PCP: Sophronia Ozell BROCKS, MD REFERRING PROVIDER: Samie Frederick, PA-C  END OF SESSION:  PT End of Session - 09/29/24 0930     Visit Number 3    Number of Visits 4    Date for Recertification  10/13/24    Authorization Type [employer]    PT Start Time 0930    PT Stop Time 1015    PT Time Calculation (min) 45 min          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) 08/26/2017   GERD (gastroesophageal reflux disease)    Heart disease    Hyperglycemia    Hypertension    Mixed hyperlipidemia    Muscle cramp 08/26/2017   Neuropathy    Osteoarthritis    RBBB    RBBB (right bundle branch block)    Type 2 diabetes mellitus (HCC)    Past Surgical History:  Procedure Laterality Date   ACHILLES TENDON REPAIR Right    BACK SURGERY     CARDIOVERSION N/A 03/03/2024   Procedure: CARDIOVERSION;  Surgeon: Sheena Pugh, DO;  Location: [facility];  Service: Cardiovascular;  Laterality: N/A;   CARDIOVERSION N/A 03/13/2024   Procedure: CARDIOVERSION;  Surgeon: Loni Soyla LABOR, MD;  Location: [facility];  Service: Cardiovascular;  Laterality: N/A;   CLIPPING OF ATRIAL APPENDAGE N/A 01/21/2024   Procedure: CLIPPING OF ATRIAL APPENDAGE USING ATRICURE ATRICLIP PRO2;  Surgeon: Shyrl Linnie KIDD, MD;  Location: [facility];  Service: Open Heart Surgery;  Laterality: N/A;   CORONARY ANGIOPLASTY     pror stent to mid RCA 10/04/2006   CORONARY ARTERY BYPASS GRAFT N/A 01/21/2024   Procedure: CORONARY ARTERY BYPASS GRAFTING (CABG) TIMES FOUR USING LEFT INTERNAL MAMMARY ARTERY AND BILATERAL ENDOSCOPICALLY HARVESTED GREATER SAPHENOUS VEINS;  Surgeon:  Shyrl Linnie KIDD, MD;  Location: [facility];  Service: Open Heart Surgery;  Laterality: N/A;   FOOT SURGERY     Cyst removal   INGUINAL HERNIA REPAIR     IR THORACENTESIS ASP PLEURAL SPACE W/IMG GUIDE  02/25/2024   IR THORACENTESIS ASP PLEURAL SPACE W/IMG GUIDE  03/06/2024   LEFT HEART CATH AND CORONARY ANGIOGRAPHY N/A 01/15/2024   Procedure: LEFT HEART CATH AND CORONARY ANGIOGRAPHY;  Surgeon: Verlin Lonni BIRCH, MD;  Location: [facility];  Service: Cardiovascular;  Laterality: N/A;   MAZE N/A 01/21/2024   Procedure: MAZE;  Surgeon: Shyrl Linnie KIDD, MD;  Location: [facility];  Service: Open Heart Surgery;  Laterality: N/A;   PERCUTANEOUS CORONARY STENT INTERVENTION (PCI-S)  09/2006   REPLACEMENT TOTAL KNEE Left    TEE WITHOUT CARDIOVERSION N/A 01/21/2024   Procedure: TRANSESOPHAGEAL ECHOCARDIOGRAM (TEE);  Surgeon: Shyrl Linnie KIDD, MD;  Location: [facility] OR;  Service: Open Heart Surgery;  Laterality: N/A;   TOTAL HIP ARTHROPLASTY Right    TOTAL HIP ARTHROPLASTY Left    Patient Active Problem List   Diagnosis Date Noted   Sinus node dysfunction (HCC) 04/15/2024   CHF (congestive heart failure) (HCC) 03/05/2024   Pleural effusion on left 03/05/2024   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   Testing of male for genetic disease carrier status 01/26/2024   History of  left atrial appendage closure 01/26/2024   Coronary artery disease 01/21/2024   New onset atrial fibrillation (HCC) 01/14/2024   Influenza A 01/14/2024   Type 2 diabetes mellitus (HCC) 01/14/2024   Hypertension associated with diabetes (HCC) 01/14/2024   Chronic kidney disease, stage 3a (HCC) 01/14/2024   Hypothyroidism 01/14/2024   Hyperlipidemia associated with type 2 diabetes mellitus (HCC) 01/14/2024   Coronary artery disease involving native coronary artery of native heart without angina pectoris 12/29/2021   Pure hypercholesterolemia 12/29/2021   Right bundle branch block 12/29/2021   Carotid artery disease 12/29/2021    Gastroesophageal reflux disease without esophagitis 04/19/2021   Impingement syndrome of left shoulder region 05/17/2020   History of prosthetic unicompartmental arthroplasty of left knee 11/18/2019   Pain in left knee 09/24/2018   Pain of left hip joint 09/24/2018   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) 08/26/2017   Muscle cramp 08/26/2017    ONSET DATE: some time  REFERRING DIAG: R26.89 (ICD-10-CM) - Other abnormalities of gait and mobility  THERAPY DIAG:  Unsteadiness on feet  Muscle weakness (generalized)  Rationale for Evaluation and Treatment: Rehabilitation  SUBJECTIVE:                                                                                                                                                                                             SUBJECTIVE STATEMENT: Still practicing the balance activities, unable to tandem walk Pt accompanied by: self  PERTINENT HISTORY: 01/14/24 w/ chest discomfort and SOB. EKG showed a-fib w/ R bundle branch block, + influenza A. 2/19 L heart cath and coronary angiography. 2/26 CABG x4 and MAZE. PMHx: CAD s/p DES to mid RCA, RBBB, T2DM, HTN, CKD stage 3a, HLD, OA, neuropathy  PAIN:  Are you having pain? No  PRECAUTIONS: None  RED FLAGS: None   WEIGHT BEARING RESTRICTIONS: No  FALLS: Has patient fallen in last 6 months? No  LIVING ENVIRONMENT: Lives with: lives with their spouse Lives in: House/apartment Stairs: yes, w/ HR Has following equipment at home: None  PLOF: Independent  PATIENT GOALS: improve balance  OBJECTIVE:   TODAY'S TREATMENT: 09/29/24 Activity Comments  Single leg lift-off 3x10   Front squat 3x10 10# dumbell  Push-release  1-3 steps for ant, post, left lateral. Right lateral LOB  Sidestep x 2 min [place] knees  Standing march 2x10 [place]  Waiter's march 5#         TODAY'S TREATMENT: 09/21/24 Activity Comments  Corner balance  multisensory activities w/ unilateral weight lifting   Waiter's carry 6#    Education regarding neuro re-ed and  use of vibration plate for neuropathic pain mgmt and LE strength             PATIENT EDUCATION: Education details: assessment details, rationale of intervention, HEP initiation Person educated: Patient Education method: Explanation and Handouts Education comprehension: verbalized understanding  HOME EXERCISE PROGRAM: Access Code: [email or web address] URL: [email or web address] Date: 09/15/2024 Prepared by: Burnard Sandifer  Exercises - Seated Ankle Dorsiflexion with Resistance  - 2-3 x weekly - 3 sets - 10 reps - 3 sec hold - Marching with Resistance  - 1 x daily - 2-3 x weekly - 3 sets - 10 reps  Access Code: [email or web address] URL: [email or web address] Date: 09/21/2024 Prepared by: Burnard Sandifer  Exercises - Corner Balance Feet Together With Eyes Closed  - 1 x daily - 7 x weekly - 3 sets - 10 reps - Tandem Stance in Corner  - 1 x daily - 7 x weekly - 3 sets - 10 reps - Waiter's Walk with Kettlebell  - 1 x daily - 7 x weekly - 3 sets - 10 reps  - Standing Foot Lift on Box (BKA)  - 1 x daily - 7 x weekly - 1-3 sets - 10 reps - 5-10 sec hold - Side Stepping with Resistance at Feet  - 1 x daily - 7 x weekly - 1-3 sets - 1-2 min rounds hold  Note: Objective measures were completed at Evaluation unless otherwise noted.  DIAGNOSTIC FINDINGS: n/a  COGNITION: Overall cognitive status: Within functional limits for tasks assessed   SENSATION: Reports numbness, burning, tingling in BLE  COORDINATION: WNL    POSTURE: No Significant postural limitations  LOWER EXTREMITY ROM:     WNL  LOWER EXTREMITY MMT:    MMT Right Eval Left Eval  Hip flexion 5 5  Hip extension    Hip abduction 5 5  Hip adduction 5 5  Hip internal rotation    Hip external rotation    Knee flexion 5 5  Knee extension 5 5  Ankle dorsiflexion 4- 5  Ankle plantarflexion 4+ 5   Ankle inversion    Ankle eversion    (Blank rows = not tested)  BED MOBILITY:  Not tested  TRANSFERS: Indep    CURB:  Findings: indep  STAIRS: Indep-modified indep, lateral unsteadiness w/ descending if not using HR GAIT: Findings: Comments: WNL good speed  FUNCTIONAL TESTS:  5 times sit to stand: 14 sec Functional gait assessment:  FUNCTIONAL GAIT ASSESSMENT  Date: 09/15/24 Score  GAIT LEVEL SURFACE Instructions: Walk at your normal speed from here to the next mark (6 m) [20 ft]. (3) Normal - Walks 6 m (20 ft) in less than 5.5 seconds, no assistive devices, good speed, no evidence for imbalance, normal gait pattern, deviates no more than 15.24 cm (6 in) outside of the 30.48-cm (12-in) walkway width.  2.   CHANGE IN GAIT SPEED Instructions: Begin walking at your normal pace (for 1.5 m [5 ft]). When I tell you "go," walk as fast as you can (for 1.5 m [5 ft]). When I tell you "slow," walk as slowly as you can (for 1.5 m [5 ft]. (3) Normal - Able to smoothly change walking speed without loss of balance or gait deviation. Shows a significant difference in walking speeds between normal, fast, and slow speeds. Deviates no more than 15.24 cm (6 in) outside of the 30.48-cm (12-in) walkway width.  3.    GAIT WITH HORIZONTAL HEAD TURNS Instructions: Walk from here to the next mark 6 m (20  ft) away. Begin walking at your normal pace. Keep walking straight; after 3 steps, turn your head to the right and keep walking straight while looking to the right. After 3 more steps, turn your head to the left and keep walking straight while looking left. Continue alternating looking right and left. (2) Mild impairment - Performs head turns smoothly with slight change in gait velocity (eg, minor disruption to smooth gait path), deviates 15.24 -25.4 cm (6 -10 in) outside 30.48-cm (12-in) walkway width, or uses an assistive device.  4.   GAIT WITH VERTICAL HEAD TURNS Instructions: Walk from here to the next  mark (6 m [20 ft]). Begin walking at your normal pace. Keep walking straight; after 3 steps, tip your head up and keep walking straight while looking up. After 3 more steps, tip your head down, keep walking straight while looking down. Continue  alternating looking up and down every 3 steps until you have completed 2 repetitions in each direction. (3) Normal - Performs head turns with no change in gait. Deviates no more than 15.24 cm (6 in) outside 30.48-cm (12-in) walkway width.  5.  GAIT AND PIVOT TURN Instructions: Begin with walking at your normal pace. When I tell you, "turn and stop," turn as quickly as you can to face the opposite direction and stop. (3) Normal - Pivot turns safely within 3 seconds and stops quickly with no loss of balance  6.   STEP OVER OBSTACLE Instructions: Begin walking at your normal speed. When you come to the shoe box, step over it, not around it, and keep walking. (3) Normal - Is able to step over 2 stacked shoe boxes taped together (22.86 cm [9 in] total height) without changing gait speed; no evidence of imbalance.  7.   GAIT WITH NARROW BASE OF SUPPORT Instructions: Walk on the floor with arms folded across the chest, feet aligned heel to toe in tandem for a distance of 3.6 m [12 ft]. The number of steps taken in a straight line are counted for a maximum of 10 steps. (1) Moderate impairment - Ambulates 4 -7 steps.  8.   GAIT WITH EYES CLOSED Instructions: Walk at your normal speed from here to the next mark (6 m [20 ft]) with your eyes closed. (3) Normal - Walks 6 m (20 ft), no assistive devices, good speed, no evidence of imbalance, normal gait pattern, deviates no more than 15.24 cm (6 in) outside 30.48-cm (12-in) walkway width. Ambulates 6 m (20 ft) in less than 7 seconds.  9.   AMBULATING BACKWARDS Instructions: Walk backwards until I tell you to stop (2) Mild impairment - Walks 6 m (20 ft), uses assistive device, slower speed, mild gait deviations, deviates 15.24  -25.4 cm (6 -10 in) outside 30.48-cm (12-in) walkway width  10. STEPS Instructions: Walk up these stairs as you would at home (ie, using the rail if necessary). At the top turn around and walk down. (2) Mild impairment-Alternating feet, must use rail.  Total 25/30   Interpretation of scores: Non-Specific Older Adults Cutoff Score: <=22/30 = risk of falls Parkinson's Disease Cutoff score <15/30= fall risk (Hoehn & Yahr 1-4)  Minimally Clinically Important Difference (MCID)  Stroke (acute, subacute, and chronic) = MDC: 4.2 points Vestibular (acute) = MDC: 6 points Community Dwelling Older Adults =  MCID: 4 points Parkinson's Disease  =  MDC: 4.3 points  (Academy of Neu[employer]ic Physical Therapy (nd). Functional Gait Assessment. Retrieved from https://www.neuropt.org/docs/default-source/cpgs/core-outcome-measures/function-gait-assessment-pocket-guide-proof9-(2).pdf?sfvrsn=b80f35043_0.)   M-CTSIB  Condition 1:  Firm Surface, EO 30 Sec, Normal Sway  Condition 2: Firm Surface, EC 30 Sec, Moderate Sway  Condition 3: Foam Surface, EO 30 Sec, Normal Sway  Condition 4: Foam Surface, EC 30 Sec, Moderate and Severe Sway    10 meter walk: 9.23 sec = 3.55 ft/sec Single leg stance: 6 sec per pt                                                                                                                                TREATMENT DATE: 09/15/24      GOALS: Goals reviewed with patient? Yes  SHORT TERM GOALS: Target date: same as LTG    LONG TERM GOALS: Target date: 10/13/2024    Patient will be independent in HEP to improve functional outcomes Baseline:  Goal status: INITIAL  2.  Demo improved postural stability per mild sway condition 4 M-CTSIB to improve safety with ADL Baseline: mod-severe x 30 sec Goal status: INITIAL  3.  Demo improved dynamic balance per score 28/30 Functional Gait Assessment Baseline: 25/30 Goal status: INITIAL  4.  Demo improved single leg stance to 10 sec  for reduced risk for falls Baseline: 6 sec per report Goal status: INITIAL  5.  Improve right ankle dorsiflexion strength 5/5 to improve ankle stability and foot clearance during gait Baseline: 4- Goal status: INITIAL    ASSESSMENT:  CLINICAL IMPRESSION: Progressed HEP development for single leg stance activities with emphasis on improved proximal strength/control and postural awareness.  Increased resistance to current HEP program. Good return demonstration to activities. Push-release testing reveals present but slightly delayed righting reactions requiring varied steps for recovery and LOB to right lateral direction. Continued sessions for review of program and refinement in prep for D/C to HEP  OBJECTIVE IMPAIRMENTS: decreased activity tolerance, decreased balance, and decreased strength.   ACTIVITY LIMITATIONS: carrying, stairs, and locomotion level  PARTICIPATION LIMITATIONS: community activity and yard work  PERSONAL FACTORS: Age, Time since onset of injury/illness/exacerbation, and 1-2 comorbidities: PMH are also affecting patient's functional outcome.   REHAB POTENTIAL: Excellent  CLINICAL DECISION MAKING: Stable/uncomplicated  EVALUATION COMPLEXITY: Low  PLAN:  PT FREQUENCY: 1x/week  PT DURATION: 4 weeks  PLANNED INTERVENTIONS: 97750- Physical Performance Testing, 97110-Therapeutic exercises, 97530- Therapeutic activity, [id number]- Neuromuscular re-education, 97535- Self Care, [id number]- Manual therapy, [id number]- Gait training, [phone] (1-2 muscles), [id number] (3+ muscles)- Dry Needling, and Patient/Family education  PLAN FOR NEXT SESSION: D/C assessment   9:30 AM, 09/29/24 M. Kelly Mahsa Hanser, PT, DPT Physical Therapist- [place] Office Number: [phone]

## 2024-10-05 ENCOUNTER — Ambulatory Visit

## 2024-10-05 DIAGNOSIS — R2681 Unsteadiness on feet: Secondary | ICD-10-CM | POA: Diagnosis not present

## 2024-10-05 DIAGNOSIS — M6281 Muscle weakness (generalized): Secondary | ICD-10-CM

## 2024-10-05 NOTE — Therapy (Signed)
 OUTPATIENT PHYSICAL THERAPY NEURO TREATMENT and D/C Summary   Patient Name: Gene Glazebrook MRN: [id number] DOB:1941/06/17, 83 y.o., male Today's Date: 10/05/2024   PCP: Sophronia Ozell BROCKS, MD REFERRING PROVIDER: Samie Frederick, PA-C  PHYSICAL THERAPY DISCHARGE SUMMARY  Visits from Start of Care: 4  Current functional level related to goals / functional outcomes: Goals met, improved FGA from 25 to 28/30   Remaining deficits: None noted   Education / Equipment: HEP   Patient agrees to discharge. Patient goals were met. Patient is being discharged due to being pleased with the current functional level.   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) 08/26/2017   GERD (gastroesophageal reflux disease)    Heart disease    Hyperglycemia    Hypertension    Mixed hyperlipidemia    Muscle cramp 08/26/2017   Neuropathy    Osteoarthritis    RBBB    RBBB (right bundle branch block)    Type 2 diabetes mellitus (HCC)    Past Surgical History:  Procedure Laterality Date   ACHILLES TENDON REPAIR Right    BACK SURGERY     CARDIOVERSION N/A 03/03/2024   Procedure: CARDIOVERSION;  Surgeon: Sheena Pugh, DO;  Location: [facility];  Service: Cardiovascular;  Laterality: N/A;   CARDIOVERSION N/A 03/13/2024   Procedure: CARDIOVERSION;  Surgeon: Loni Soyla LABOR, MD;  Location: [facility];  Service: Cardiovascular;  Laterality: N/A;   CLIPPING OF ATRIAL APPENDAGE N/A 01/21/2024   Procedure: CLIPPING OF ATRIAL APPENDAGE USING  ATRICURE ATRICLIP PRO2;  Surgeon: Shyrl Linnie KIDD, MD;  Location: [facility] OR;  Service: Open Heart Surgery;  Laterality: N/A;   CORONARY ANGIOPLASTY     pror stent to mid RCA 10/04/2006   CORONARY ARTERY BYPASS GRAFT N/A 01/21/2024   Procedure: CORONARY ARTERY BYPASS GRAFTING (CABG) TIMES FOUR USING LEFT INTERNAL MAMMARY ARTERY AND BILATERAL ENDOSCOPICALLY HARVESTED GREATER SAPHENOUS VEINS;  Surgeon: Shyrl Linnie KIDD, MD;  Location: [facility];  Service: Open Heart Surgery;  Laterality: N/A;   FOOT SURGERY     Cyst removal   INGUINAL HERNIA REPAIR     IR THORACENTESIS ASP PLEURAL SPACE W/IMG GUIDE  02/25/2024   IR THORACENTESIS ASP PLEURAL SPACE W/IMG GUIDE  03/06/2024   LEFT HEART CATH AND CORONARY ANGIOGRAPHY N/A 01/15/2024   Procedure: LEFT HEART CATH AND CORONARY ANGIOGRAPHY;  Surgeon: Verlin Lonni BIRCH, MD;  Location: [facility];  Service: Cardiovascular;  Laterality: N/A;   MAZE N/A 01/21/2024   Procedure: MAZE;  Surgeon: Shyrl Linnie KIDD, MD;  Location: [facility];  Service: Open Heart Surgery;  Laterality: N/A;   PERCUTANEOUS CORONARY STENT INTERVENTION (PCI-S)  09/2006   REPLACEMENT TOTAL KNEE Left    TEE WITHOUT CARDIOVERSION N/A 01/21/2024   Procedure: TRANSESOPHAGEAL ECHOCARDIOGRAM (TEE);  Surgeon: Shyrl Linnie KIDD, MD;  Location: [facility] OR;  Service: Open Heart Surgery;  Laterality: N/A;  TOTAL HIP ARTHROPLASTY Right    TOTAL HIP ARTHROPLASTY Left    Patient Active Problem List   Diagnosis Date Noted   Sinus node dysfunction (HCC) 04/15/2024   CHF (congestive heart failure) (HCC) 03/05/2024   Pleural effusion on left 03/05/2024   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   Testing of male for genetic disease carrier status 01/26/2024   History of left atrial appendage closure 01/26/2024   Coronary artery disease 01/21/2024   New onset atrial fibrillation (HCC) 01/14/2024   Influenza A 01/14/2024   Type 2 diabetes mellitus (HCC) 01/14/2024   Hypertension associated with diabetes  (HCC) 01/14/2024   Chronic kidney disease, stage 3a (HCC) 01/14/2024   Hypothyroidism 01/14/2024   Hyperlipidemia associated with type 2 diabetes mellitus (HCC) 01/14/2024   Coronary artery disease involving native coronary artery of native heart without angina pectoris 12/29/2021   Pure hypercholesterolemia 12/29/2021   Right bundle branch block 12/29/2021   Carotid artery disease 12/29/2021   Gastroesophageal reflux disease without esophagitis 04/19/2021   Impingement syndrome of left shoulder region 05/17/2020   History of prosthetic unicompartmental arthroplasty of left knee 11/18/2019   Pain in left knee 09/24/2018   Pain of left hip joint 09/24/2018   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 Pt accompanied by: self  PERTINENT HISTORY: 01/14/24 w/ chest discomfort and SOB. EKG showed a-fib w/ R bundle branch block, + influenza A. 2/19 L heart cath and coronary angiography. 2/26 CABG x4 and MAZE. PMHx: CAD s/p DES to mid RCA, RBBB, T2DM, HTN, CKD stage 3a, HLD, OA, neuropathy  PAIN:  Are you having pain? No  PRECAUTIONS: None  RED FLAGS: None   WEIGHT BEARING RESTRICTIONS: No  FALLS: Has patient fallen in last 6 months? No  LIVING ENVIRONMENT: Lives with: lives with their spouse Lives in: House/apartment Stairs: yes, w/ HR Has following equipment at home: None  PLOF: Independent  PATIENT GOALS: improve  balance  OBJECTIVE:   TODAY'S TREATMENT: 10/05/24 Activity Comments  HEP review Upgraded to green resistance loop  PNF chops   Single leg  stance  12 sec LLE, 6 sec RLE              TODAY'S TREATMENT: 09/29/24 Activity Comments  Single leg lift-off 3x10   Front squat 3x10 10# dumbell  Push-release  1-3 steps for ant, post, left lateral. Right lateral LOB  Sidestep x 2 min Green loop knees  Standing march 2x10 Green loop  Waiter's march 5#         TODAY'S TREATMENT: 09/21/24 Activity Comments  Corner balance multisensory activities w/ unilateral weight lifting   Waiter's carry 6#    Education regarding neuro re-ed and use of vibration plate for neuropathic pain mgmt and LE strength             PATIENT EDUCATION: Education details: assessment details, rationale of intervention, HEP initiation Person educated: Patient Education method: Explanation and Handouts Education comprehension: verbalized understanding  HOME EXERCISE PROGRAM: Access Code: [email or web address] URL: [email or web address] Date: 09/15/2024 Prepared by: Burnard Sandifer  Exercises - Seated Ankle Dorsiflexion with Resistance  - 2-3 x weekly - 3 sets - 10 reps - 3 sec hold - Marching with Resistance  - 1 x daily - 2-3 x weekly - 3 sets - 10 reps  Access Code: [email or web address] URL: [email or web address] Date: 09/21/2024 Prepared by: Burnard Sandifer  Exercises - Corner Balance Feet Together With Eyes Closed  - 1 x daily - 7 x weekly - 3 sets - 10 reps - Tandem Stance in Corner  - 1 x daily - 7 x weekly - 3 sets - 10 reps - Waiter's Walk with Kettlebell  - 1 x daily - 7 x weekly - 3 sets - 10 reps  - Standing Foot Lift on Box (BKA)  - 1 x daily - 7 x weekly - 1-3 sets - 10 reps - 5-10 sec hold - Side Stepping with Resistance at Feet  - 1 x daily - 7 x weekly - 1-3 sets - 1-2 min rounds hold  Note: Objective measures were completed at Evaluation unless otherwise noted.  DIAGNOSTIC FINDINGS:  n/a  COGNITION: Overall cognitive status: Within functional limits for tasks assessed   SENSATION: Reports numbness, burning, tingling in BLE  COORDINATION: WNL    POSTURE: No Significant postural limitations  LOWER EXTREMITY ROM:     WNL  LOWER EXTREMITY MMT:    MMT Right Eval Left Eval  Hip flexion 5 5  Hip extension    Hip abduction 5 5  Hip adduction 5 5  Hip internal rotation    Hip external rotation    Knee flexion 5 5  Knee extension 5 5  Ankle dorsiflexion 4- 5  Ankle plantarflexion 4+ 5  Ankle inversion    Ankle eversion    (Blank rows = not tested)  BED MOBILITY:  Not tested  TRANSFERS: Indep    CURB:  Findings: indep  STAIRS: Indep-modified indep, lateral unsteadiness w/ descending if not using HR GAIT: Findings: Comments: WNL good speed  FUNCTIONAL TESTS:  5 times sit to stand: 14 sec Functional gait assessment:  FUNCTIONAL GAIT ASSESSMENT  Date: 11/10 Score  GAIT LEVEL SURFACE Instructions: Walk at your normal speed from here to the next mark (6 m) [20 ft]. (3) Normal - Walks 6 m (20 ft) in less than 5.5 seconds, no assistive devices, good speed, no evidence for imbalance, normal gait pattern, deviates no more than 15.24 cm (6 in) outside of the 30.48-cm (12-in) walkway width.  2.   CHANGE IN GAIT SPEED Instructions: Begin walking at your  normal pace (for 1.5 m [5 ft]). When I tell you "go," walk as fast as you can (for 1.5 m [5 ft]). When I tell you "slow," walk as slowly as you can (for 1.5 m [5 ft]. (3) Normal - Able to smoothly change walking speed without loss of balance or gait deviation. Shows a significant difference in walking speeds between normal, fast, and slow speeds. Deviates no more than 15.24 cm (6 in) outside of the 30.48-cm (12-in) walkway width.  3.    GAIT WITH HORIZONTAL HEAD TURNS Instructions: Walk from here to the next mark 6 m (20 ft) away. Begin walking at your normal pace. Keep walking straight; after 3 steps,  turn your head to the right and keep walking straight while looking to the right. After 3 more steps, turn your head to the left and keep walking straight while looking left. Continue alternating looking right and left. (3) Normal - Performs head turns smoothly with no change in gait. Deviates no more than 15.24 cm (6 in) outside 30.48-cm (12-in) walkway width.  4.   GAIT WITH VERTICAL HEAD TURNS Instructions: Walk from here to the next mark (6 m [20 ft]). Begin walking at your normal pace. Keep walking straight; after 3 steps, tip your head up and keep walking straight while looking up. After 3 more steps, tip your head down, keep walking straight while looking down. Continue  alternating looking up and down every 3 steps until you have completed 2 repetitions in each direction. (3) Normal - Performs head turns with no change in gait. Deviates no more than 15.24 cm (6 in) outside 30.48-cm (12-in) walkway width.  5.  GAIT AND PIVOT TURN Instructions: Begin with walking at your normal pace. When I tell you, "turn and stop," turn as quickly as you can to face the opposite direction and stop. (3) Normal - Pivot turns safely within 3 seconds and stops quickly with no loss of balance  6.   STEP OVER OBSTACLE Instructions: Begin walking at your normal speed. When you come to the shoe box, step over it, not around it, and keep walking. (3) Normal - Is able to step over 2 stacked shoe boxes taped together (22.86 cm [9 in] total height) without changing gait speed; no evidence of imbalance.  7.   GAIT WITH NARROW BASE OF SUPPORT Instructions: Walk on the floor with arms folded across the chest, feet aligned heel to toe in tandem for a distance of 3.6 m [12 ft]. The number of steps taken in a straight line are counted for a maximum of 10 steps. (2) Mild impairment - Ambulates 7-9 steps  8.   GAIT WITH EYES CLOSED Instructions: Walk at your normal speed from here to the next mark (6 m [20 ft]) with your eyes closed.  (3) Normal - Walks 6 m (20 ft), no assistive devices, good speed, no evidence of imbalance, normal gait pattern, deviates no more than 15.24 cm (6 in) outside 30.48-cm (12-in) walkway width. Ambulates 6 m (20 ft) in less than 7 seconds.  9.   AMBULATING BACKWARDS Instructions: Walk backwards until I tell you to stop (3) Normal - Walks 6 m (20 ft), no assistive devices, good speed, no evidence for imbalance, normal gait pattern, deviates no more than 15.24 cm (6 in) outside 30.48-cm (12-in) walkway width.  10. STEPS Instructions: Walk up these stairs as you would at home (ie, using the rail if necessary). At the top turn around and walk down. (  2) Mild impairment-Alternating feet, must use rail.  Total 28/30   Interpretation of scores: Non-Specific Older Adults Cutoff Score: <=22/30 = risk of falls Parkinson's Disease Cutoff score <15/30= fall risk (Hoehn & Yahr 1-4)  Minimally Clinically Important Difference (MCID)  Stroke (acute, subacute, and chronic) = MDC: 4.2 points Vestibular (acute) = MDC: 6 points Community Dwelling Older Adults =  MCID: 4 points Parkinson's Disease  =  MDC: 4.3 points  (Academy of Neu[employer]ic Physical Therapy (nd). Functional Gait Assessment. Retrieved from https://www.neuropt.org/docs/default-source/cpgs/core-outcome-measures/function-gait-assessment-pocket-guide-proof9-(2).pdf?sfvrsn=b8f35043_0.)   M-CTSIB  Condition 1: Firm Surface, EO 30 Sec, Normal Sway  Condition 2: Firm Surface, EC 30 Sec, Moderate Sway  Condition 3: Foam Surface, EO 30 Sec, Normal Sway  Condition 4: Foam Surface, EC 30 Sec, Moderate and Severe Sway    10 meter walk: 9.23 sec = 3.55 ft/sec Single leg stance: 6 sec per pt                                                                                                                                TREATMENT DATE: 09/15/24      GOALS: Goals reviewed with patient? Yes  SHORT TERM GOALS: Target date: same as LTG    LONG TERM  GOALS: Target date: 10/13/2024    Patient will be independent in HEP to improve functional outcomes Baseline:  Goal status: MET  2.  Demo improved postural stability per mild sway condition 4 M-CTSIB to improve safety with ADL Baseline: mod-severe x 30 sec; mild x 30 Goal status: MET  3.  Demo improved dynamic balance per score 28/30 Functional Gait Assessment Baseline: 25/30; 28/30 Goal status: MET  4.  Demo improved single leg stance to 10 sec for reduced risk for falls Baseline: 6 sec per report; 12 sec Goal status: MET  5.  Improve right ankle dorsiflexion strength 5/5 to improve ankle stability and foot clearance during gait Baseline: 4-; 5/5 Goal status: MET    ASSESSMENT:  CLINICAL IMPRESSION: POC performance/review with improved ankle strength to manual muscle testing and improved single leg stance balance from 6 to 12 sec LLE. FGA from 25 to 28/30 indicating low risk for falls.  HEP performance/review with good recall and discussed relevant progression for weight/complexity. D/C to HEP at this time.   OBJECTIVE IMPAIRMENTS: decreased activity tolerance, decreased balance, and decreased strength.   ACTIVITY LIMITATIONS: carrying, stairs, and locomotion level  PARTICIPATION LIMITATIONS: community activity and yard work  PERSONAL FACTORS: Age, Time since onset of injury/illness/exacerbation, and 1-2 comorbidities: PMH are also affecting patient's functional outcome.   REHAB POTENTIAL: Excellent  CLINICAL DECISION MAKING: Stable/uncomplicated  EVALUATION COMPLEXITY: Low  PLAN:  PT FREQUENCY: 1x/week  PT DURATION: 4 weeks  PLANNED INTERVENTIONS: 97750- Physical Performance Testing, 97110-Therapeutic exercises, 97530- Therapeutic activity, [id number]- Neuromuscular re-education, 97535- Self Care, [id number]- Manual therapy, [id number]- Gait training, [phone] (1-2 muscles), [id number] (3+ muscles)- Dry Needling, and Patient/Family education  PLAN FOR NEXT SESSION: D/C  assessment  8:45 AM, 10/05/24 M. Kelly William Laske, PT, DPT Physical Therapist- [place] Office Number: [phone]

## 2024-10-30 NOTE — Progress Notes (Signed)
 Cardiology Office Note:  .   Date:  11/09/2024  ID:  Darin Mcclure, DOB 1941/04/18, MRN [id number] PCP: Sophronia Ozell BROCKS, MD  [facility] Providers Cardiologist:  Oneil Parchment, MD    Patient Profile: .      PMH Coronary artery disease DES to RCA 09/2016 Repeat cardiac cath 2009 showed patent stent in RCA, moderate disease involving LAD and diagonal branches Stress test 01/12/2021 - low risk no ischemia LHC 01/15/2024 Proximal RCA 1 lesion 80 Proximal RCA to lesion 30 Mid RCA 30 Mid Cx 90 Second marginal 90 Mid LAD 80 Distal LAD 70 Second diagonal 95 Stent in mRCA with minimal restenosis Normal LVEDP S/p CABG x 4 with MAZE and LA clip on 01/21/24 Pure hypercholesterolemia Hypertriglyceridemia Atrial fibrillation Type 2 diabetes mellitus Peripheral neuropathy Carotid artery disease Right bundle branch block Hypertension Mitral valve stenosis Pleural effusion  History of coronary artery disease with DES (Cypher 3.0) to mid RCA, ostium of left main coronary artery normal by IVUS in November 2007.  Subsequent catheterization 09/29/2008 revealed patent RCA stent, EF 70%, moderate coronary artery disease involving the LAD, diagonal, and circumflex branches.  Stress test 11/2014 showed mild small area of infarct in the inferior wall, LV function abnormal and mid inferior and basal inferior wall with EF 50%.  Carotid duplex showed no significant stenosis bilaterally.  A calcified stable plaque of the carotid bulbs mostly on the left.   He established care with Dr. Parchment in 2019 after moving from [place] .  He reported past medical history of diabetes with peripheral neuropathy, hyperlipidemia, and hypertension.  He retired from [employer] after 50 years and moved to [place] to be with his grandchildren.  Seen in cardiology clinic 01/15/2023 at which time he reported chronic back pain that somewhat limited his activity.  He was looking to lose weight and wanted to work with  a systems analyst that is also a nutritionist.  He was not having any concerning cardiac symptoms and was recommended to follow-up in 1 year.  He underwent lumbar laminectomy with Dr. Joshua 02/2023, at which time he held Plavix  and aspirin  perioperatively.  He presented to the ED  01/14/2024 with atrial fibrillation. Found to have influenza A. He had recently had symptoms of URI treated with course of antibiotics and steroids completed on 01/13/2024.  On DOA, he was having shortness of breath and chest pain. Pain was midsternal and did not radiate but did improve with rest.  He also had increasing fatigue.  Upon arrival he was found to be in atrial fibrillation with intermittent episodes of RVR with heart rate up to the 150s, RBBB. Cardiology was consulted. BNP was 66. Hs troponin 94 >>94. Echo revealed normal LV EF, no RWMA, mod LVH, normal RV, mild to mod MS. He underwent LHC 2/19 which revealed severe mid LAD stenosis at takeoff of small caliber diagonal branch, diagonal branch with severe stenosis, mid circumflex severe stenosis involving the ostium of a large caliber obtuse marginal branch, large dominant RCA heavily calcified throughout proximal to mid vessel, proximal vessel has heavily calcified severe stenosis, stent in mid RCA with minimal restenosis.  Recommendation for CABG.    He underwent CABG x 4, maze, LAA clip with Dr. Shyrl on 01/21/2024.  Postop course complicated by anemia and did require postop transfusion. Noted to have mild thrombocytopenia.  Triglycerides were elevated at 332, LDL 16. Creatinine increased to 1.81 postop but did improve to 1.59 at time of discharge. He was discharged on  01/26/2024.  He continued to have cough that prevented him from sleeping as well as becoming more SOB with ambulation.  He underwent CXR which revealed mild to moderate left pleural effusion. He underwent left thoracentesis on 01/31/2024.   Seen by me on 02/10/24 for hospital follow-up s/p CABG,  accompanied by his wife. He reported increased shortness of breath since thoracentesis on 01/31/24. He felt significant improvement in his breathing following the thoracentesis but SOB has gradually worsened since that time. Walking into the office from the car today was the most he has exerted himself since surgery. Not aware that HR is elevated, no palpitations.  He reported pain in left chest with coughing, otherwise no chest pain. Bilateral LE edema that has improved but still persists. During hospitalization, he did not feel AF RVR.  PT has come to his home but he has had difficulty completing their assigned exercises. Overall he is sleeping well, but had difficulty getting comfortable last night. He denied orthopnea, PND. No concerning side effects with medications. He has several questions regarding some of the changes in the medications. Has noted HR 60-80 bpm at rest consistently. Unfortunately, EKG revealed tachycardia, uncertain whether it is atrial flutter or sinus tachycardia. Reviewed with Dr. Jeffrie, primary cards, who advised we continue amiodarone  200 mg daily and increase Toprol  to 50 mg twice daily.  We increased furosemide  for 1 week and advised him to have CXR on morning prior to appointment with Dr. Shyrl.  Labs obtained that day with improvement in hemoglobin and hematocrit since surgery, creatinine remained elevated but stable.  CXR 02/14/2024 revealed improvement in pleural effusion.  He was advised to reduce Lasix  to 40 mg and potassium to 10 mEq for 1 week and then discontinue.  Seen in follow-up by Dr. Jeffrie on 02/26/2024.  EKG revealed atrial flutter with HR 115 bpm.  He reported possible seroma at vein site of his leg which was drained by CT surgery team and wrapped.  He was advised to increase Eliquis  to 5 mg twice daily due to improved kidney function.  He underwent successful DCCV 03/03/2024.  He called the following day to report shortness of breath with walking 10 feet.  He was  advised to go to ER for evaluation.  He was afebrile.  BNP was 170. Troponins 22 >> 25.  D-dimer was elevated.  CXR without evidence of pulmonary edema. CT scan was negative for PE. Cardiology was consulted and he was admitted for observation.  Per CT surgery team, CT confirmed moderate left pleural effusion with compressive type atelectasis on lower left lobe.  Also found to have a 4 mm right middle lobe pulmonary nodule.  Echo 03/05/2024 revealed LVEF 60 to 65%, no WMA, mild LVH, elevated LVEDP, mildly elevated pulmonary artery systolic pressure, mild MR, elevated right atrial pressure of 8 mmHg.  With IV diuresis, weight improved from 222 lb to 216.9 lb. He underwent thoracentesis with removal of 1.1 L clear amber-colored pleural fluid.  Postprocedure CXR showed no pneumothorax.  He was discharged on 2 mg of Bumex  given poor response to po Lasix .  Follow-up lab work 03/12/2024 revealed elevated creatinine and Bumex  was reduced to half tablet (1 mg) daily.  He called our office 03/18/2024 to report shortness of breath. Reported he can only walk 20 to 30 feet before becoming SOB.  No chest pain except when coughing which is not new.  BP and HR were well-controlled.  He was advised to increase Bumex  to 1 full tablet or  2 mg daily.  Seen by me on 03/26/24, accompanied by his wife reporting persistent shortness of breath and concerns about medication effects since cardioversion. Does not feel that his HR elevates appropriately.  Post-cardioversion two weeks prior, he noticed significant shortness of breath and reduced exercise tolerance, sometimes unable to walk more than 20 feet. Previously, he could walk about 50 yards before experiencing symptoms. Some days he struggles with short distances. He denies chest pain, swelling, PND, or orthopnea. He reports brisk urine output on Bumex . Creatinine levels have increased from 1.73 to 1.87 on most recent labs. He experienced lightheadedness following his last  cardioversion, with one significant episode occurring a few days post-procedure.  No recent episodes of lightheadedness. BP remains stable around 132/73, and his weight is stable around 210 pounds after a 25-pound loss since his hospital stay. Walk test around the office demonstrated appropriate increase in HR, no increased work of breath felt to be secondary to deconditioning. He was cleared to start cardiac rehab.   Seen by Dr. Jeffrie 05/15/24, pleural effusion had resolved on CXR. HR had dropped into the 30s during cardiac rehab and Toprol  was discontinued. Amiodarone  had been decreased a few weeks prior and he was maintaining SR at that time. Cardiac monitor revealed SR with bundle branch block, average HR 70 bpm, one 3.3 second pause, transient junctional rhythm mostly early morning, frequent PVCs. Amiodarone  was discontinued. Advised to take Bumex  as needed for 2-3 lb weight gain.        History of Present Illness: .    History of Present Illness Corgan Mormile is a very pleasant 83 year old male who is here today for follow-up of coronary artery disease. He asks about LDL which is currently 32 mg/dL and at what point it is considered too low.  He denies side effects from atorvastatin  40 mg daily.  He reports since stopping amiodarone , he has not had any concerning symptoms.  He did not have significant palpitations or other symptoms when he developed A-fib postsurgery.  He occasionally monitors home BP and pulse and has not noticed any irregularities.  Admits to being less active since completing cardiac rehab and has gained about 10 pounds.  A1c increased from 5.4% during rehab to 6.4%. He wants to resume regular exercise for weight and glycemic control. He has persistent intense itching at the top of his surgical incision, without clear trigger and without targeted treatment.  He denies chest pain, shortness of breath, palpitations, orthopnea, PND, edema, presyncope, syncope.  Discussed the use  of AI scribe software for clinical note transcription with the patient, who gave verbal consent to proceed.   ROS: See HPI       Studies Reviewed: SABRA        No results found for: LIPOA    Risk Assessment/Calculations:            Physical Exam:   VS:  BP 122/70   Pulse 82   Ht 6' 1 (1.854 m)   Wt 218 lb (98.9 kg)   SpO2 98% Comment: RA  BMI 28.76 kg/m    Wt Readings from Last 3 Encounters:  11/09/24 218 lb (98.9 kg)  07/17/24 216 lb 0.8 oz (98 kg)  06/23/24 212 lb (96.2 kg)    GEN: Well nourished, well developed in no acute distress NECK: No JVD; No carotid bruits CARDIAC: RRR, no murmurs, rubs, gallops RESPIRATORY:  No increased work of breathing. Diminished breath sounds left lower lobe. No rales, wheezing  or rhonchi  ABDOMEN: Sternotomy incision is well approximated, no signs of infection. Soft, non-tender, non-distended EXTREMITIES:  No edema; No deformity     ASSESSMENT AND PLAN: .    CAD s/p CABG S/p CABG x 4 on 01/21/24, previously placed stent in mRCA with minimal restenosis on cath 01/15/24. He is now 10 months post-op and is feeling well.  Admits he has not been as active since graduating from cardiac rehab.  He denies chest pain, dyspnea, or other symptoms concerning for angina.  No indication for further ischemic evaluation at this time. BP is well controlled.  - Encouraged increased physical activity to be active every day and aiming for at least 150 minutes of moderate intensity exercise each week -Heart healthy diet avoiding saturated fat, processed foods, sugar, and other simple carbohydrates encouraged - Continue aspirin , atorvastatin   Atrial fibrillation/flutter on chronic anticoagulation s/p LA appendage closure He presented to hospital on 01/13/2024 in AF RVR. He was asymptomatic. He ultimately underwent CABG for obstructive CAD at which time he additionally had MAZE and LAA closure for treatment of a fib. He had return of a fib RVR at office visit.  02/10/24. He underwent successful DCCV 03/03/24.  Cardiac monitor completed 05/19/2024 revealed no A-fib and he subsequently discontinued amiodarone  and then discontinued Eliquis .  No return of A-fib to his awareness. HR is well controlled.  - Continue to monitor clinically, notify us  if return of irregular heart rhythm  Diabetes Preop A1c was 7.1. He was started on Januvia and metformin .  Most recent A1c on 11/02/2024 6.4%.  - Regular physical activity and healthy diet emphasized - Management per PCP  CKD Stage IIIb Renal function stable on labs completed 11/02/24.  No cardiac medications that are nephrotoxic. - Management per PCP  Carotid artery disease Minimal wall thickening in right ICA, 1-39% stenosis in left ICA. No acute concerns today.  - Continue to monitor clinically for now.  Hyperlipidemia LDL goal < 55 Lipid panel completed 11/02/2024 with total cholesterol 102, triglycerides 101, HDL 51, and LDL 32.  Lengthy discussion about cholesterol boundaries.  Advised there is no data that LDL in the 30s is too low.  Questions were answered to his satisfaction.  He is agreeable to continue statin therapy at current dose. - Continue atorvastatin  40 mg daily - Heart healthy diet and regular exercise encouraged         Disposition: 6 months with Dr Jeffrie or me  Signed, Rosaline Bane, NP-C

## 2024-11-02 LAB — LAB REPORT - SCANNED
A1c: 6.4
EGFR: 51

## 2024-11-09 ENCOUNTER — Ambulatory Visit (INDEPENDENT_AMBULATORY_CARE_PROVIDER_SITE_OTHER): Admitting: Nurse Practitioner

## 2024-11-09 ENCOUNTER — Encounter (HOSPITAL_BASED_OUTPATIENT_CLINIC_OR_DEPARTMENT_OTHER): Payer: Self-pay | Admitting: Nurse Practitioner

## 2024-11-09 VITALS — BP 122/70 | HR 82 | Ht 73.0 in | Wt 218.0 lb

## 2024-11-09 DIAGNOSIS — I6523 Occlusion and stenosis of bilateral carotid arteries: Secondary | ICD-10-CM | POA: Diagnosis not present

## 2024-11-09 DIAGNOSIS — E1159 Type 2 diabetes mellitus with other circulatory complications: Secondary | ICD-10-CM

## 2024-11-09 DIAGNOSIS — I483 Typical atrial flutter: Secondary | ICD-10-CM | POA: Diagnosis not present

## 2024-11-09 DIAGNOSIS — I2510 Atherosclerotic heart disease of native coronary artery without angina pectoris: Secondary | ICD-10-CM

## 2024-11-09 DIAGNOSIS — Z951 Presence of aortocoronary bypass graft: Secondary | ICD-10-CM

## 2024-11-09 DIAGNOSIS — N1832 Chronic kidney disease, stage 3b: Secondary | ICD-10-CM

## 2024-11-09 NOTE — Patient Instructions (Signed)
 Medication Instructions:  Your physician recommends that you continue on your current medications as directed. Please refer to the Current Medication list given to you today.  *If you need a refill on your cardiac medications before your next appointment, please call your pharmacy*  Lab Work: NONE  Testing/Procedures: NONE  Follow-Up: At [facility], you and your health needs are our priority.  As part of our continuing mission to provide you with exceptional heart care, we have created designated Provider Care Teams.  These Care Teams include your primary Cardiologist (physician) and Advanced Practice Providers (APPs -  Physician Assistants and Nurse Practitioners) who all work together to provide you with the care you need, when you need it.  We recommend signing up for the patient portal called MyChart.  Sign up information is provided on this After Visit Summary.  MyChart is used to connect with patients for Virtual Visits (Telemedicine).  Patients are able to view lab/test results, encounter notes, upcoming appointments, etc.  Non-urgent messages can be sent to your provider as well.   To learn more about what you can do with MyChart, go to [email or web address].    Your next appointment:   6 month(s)  The format for your next appointment:   In Person  Provider:   Dr Jeffrie or Rosaline RAMAN NP   Adopting a Healthy Lifestyle.   Weight: Know what a healthy weight is for you (roughly BMI <25) and aim to maintain this. You can calculate your body mass index on your smart phone. Unfortunately, this is not the most accurate measure of healthy weight, but it is the simplest measurement to use. A more accurate measurement involves body scanning which measures lean muscle, fat tissue and bony density. We do not have this equipment at [facility].    Diet: Aim for 7+ servings of fruits and vegetables daily Limit animal fats in diet for cholesterol and heart health - choose grass  fed whenever available Avoid highly processed foods (fast food burgers, tacos, fried chicken, pizza, hot dogs, french fries)  Saturated fat comes in the form of butter, lard, coconut oil, margarine, partially hydrogenated oils, dairy products, and fat in meat. These increase your risk of cardiovascular disease.  Use healthy plant oils, such as olive, canola, soy, corn, sunflower and peanut.  Whole foods such as fruits, vegetables and whole grains have fiber  Men need > 38 grams of fiber per day Women need > 25 grams of fiber per day  Load up on vegetables and fruits - one-half of your plate: Aim for color and variety, and remember that potatoes dont count. Go for whole grains - one-quarter of your plate: Whole wheat, barley, wheat berries, quinoa, oats, brown rice, and foods made with them. If you want pasta, go with whole wheat pasta. Protein power - one-quarter of your plate: Fish, chicken, beans, and nuts are all healthy, versatile protein sources. Limit red meat. You need carbohydrates for energy! The type of carbohydrate is more important than the amount. Choose carbohydrates such as vegetables, fruits, whole grains, beans, and nuts in the place of white rice, white pasta, potatoes (baked or fried), macaroni and cheese, cakes, cookies, and donuts.  If youre thirsty, drink water. Coffee and tea are good in moderation, but skip sugary drinks and limit milk and dairy products to one or two daily servings. Keep sugar intake at 6 teaspoons or 24 grams or LESS       Exercise: Aim for 150 min of moderate intensity  exercise weekly for heart health, and weights twice weekly for bone health Stay active - any steps are better than no steps! Aim for 7-9 hours of sleep daily   Sleep: This provides your body with the reset and relaxation that it needs!  Aim to get 7-8 hours of sleep each night. Limit caffeine, screen time, and other distractions prior to bedtime.  Keep your bedroom cool and dark and  do not wear heavy clothing to bed or use heavy bed covers - layer if needed.

## 2024-11-11 ENCOUNTER — Ambulatory Visit: Admitting: Physician Assistant

## 2024-11-16 ENCOUNTER — Ambulatory Visit (HOSPITAL_BASED_OUTPATIENT_CLINIC_OR_DEPARTMENT_OTHER): Payer: Self-pay | Admitting: Nurse Practitioner

## 2024-12-09 ENCOUNTER — Ambulatory Visit: Admitting: Podiatry

## 2024-12-09 DIAGNOSIS — M79675 Pain in left toe(s): Secondary | ICD-10-CM | POA: Diagnosis not present

## 2024-12-09 DIAGNOSIS — E1142 Type 2 diabetes mellitus with diabetic polyneuropathy: Secondary | ICD-10-CM | POA: Diagnosis not present

## 2024-12-09 DIAGNOSIS — B351 Tinea unguium: Secondary | ICD-10-CM

## 2024-12-09 DIAGNOSIS — M79674 Pain in right toe(s): Secondary | ICD-10-CM

## 2024-12-17 ENCOUNTER — Encounter: Payer: Self-pay | Admitting: Podiatry

## 2024-12-17 NOTE — Progress Notes (Unsigned)
"  °  Subjective:  Patient ID: Darin Mcclure, male    DOB: 1941-07-25,  MRN: [id number]  Darin Mcclure presents to clinic today for at risk foot care with history of diabetic neuropathy and painful elongated overgrown toenails which are tender when wearing enclosed shoe gear.  Chief Complaint  Patient presents with   [facility]    [facility]. Glucose 143 06/03/24.  Sophronia Ozell BROCKS, MD (PCP) 11/09/24   New problem(s): None.   PCP is Sophronia Ozell BROCKS, MD.  Allergies[1]  Review of Systems: Negative except as noted in the HPI.  Objective:  There were no vitals filed for this visit. Darin Mcclure is a pleasant 84 y.o. male WD, WN in NAD. AAO x 3.  Vascular Examination: Capillary refill time immediate b/l. Vascular status intact b/l with palpable pedal pulses. Pedal hair present b/l. No pain with calf compression b/l. Skin temperature gradient WNL b/l. No cyanosis or clubbing b/l. No ischemia or gangrene noted b/l.   Neurological Examination: Sensation grossly intact b/l with 10 gram monofilament. Vibratory sensation intact b/l. Pt has subjective symptoms of neuropathy.  Dermatological Examination: Pedal skin with normal turgor, texture and tone b/l.  No open wounds. No interdigital macerations.   {[id number]}  No hyperkeratotic nor porokeratotic lesions present on today's visit.  Musculoskeletal Examination: Muscle strength 5/5 to all lower extremity muscle groups bilaterally. Muscle strength 2/5 to all lower extremity muscle groups bilaterally. Hammertoes b/l LE.  Radiographs: None  Assessment/Plan: 1. Pain due to onychomycosis of toenails of both feet   2. Diabetic peripheral neuropathy associated with type 2 diabetes mellitus (HCC)   -Consent given for treatment as described below: -Examined patient. -Nondystrophic toenails trimmed 1-5 bilaterally. -Patient/POA to call should there be question/concern in the interim.   Return in about 3 months (around 03/09/2025).  Delon LITTIE Merlin, DPM      [place] LOCATION: [address] [place] [address]                   Office [phone]    LOCATION: [address] [place], [place] [address] Office [phone]     [1]  Allergies Allergen Reactions   Oxycodone  Other (See Comments)    Paradoxical excitation   "

## 2025-03-16 ENCOUNTER — Ambulatory Visit: Admitting: Podiatry
